# Patient Record
Sex: Female | Born: 1970 | Race: White | Hispanic: No | Marital: Married | Smoking: Former smoker
Health system: Southern US, Community
[De-identification: ages and names within clinical notes are randomized; demographics above are authoritative.]

## PROBLEM LIST (undated history)

## (undated) DIAGNOSIS — IMO0002 Reserved for concepts with insufficient information to code with codable children: Secondary | ICD-10-CM

## (undated) DIAGNOSIS — M1990 Unspecified osteoarthritis, unspecified site: Secondary | ICD-10-CM

## (undated) DIAGNOSIS — N200 Calculus of kidney: Secondary | ICD-10-CM

## (undated) DIAGNOSIS — N390 Urinary tract infection, site not specified: Secondary | ICD-10-CM

## (undated) DIAGNOSIS — M489 Spondylopathy, unspecified: Secondary | ICD-10-CM

## (undated) DIAGNOSIS — N201 Calculus of ureter: Principal | ICD-10-CM

## (undated) DIAGNOSIS — R112 Nausea with vomiting, unspecified: Secondary | ICD-10-CM

## (undated) DIAGNOSIS — B019 Varicella without complication: Secondary | ICD-10-CM

## (undated) DIAGNOSIS — Z9889 Other specified postprocedural states: Secondary | ICD-10-CM

## (undated) DIAGNOSIS — I10 Essential (primary) hypertension: Secondary | ICD-10-CM

## (undated) DIAGNOSIS — R310 Gross hematuria: Secondary | ICD-10-CM

## (undated) DIAGNOSIS — C4490 Unspecified malignant neoplasm of skin, unspecified: Secondary | ICD-10-CM

## (undated) DIAGNOSIS — R3129 Other microscopic hematuria: Secondary | ICD-10-CM

## (undated) DIAGNOSIS — N809 Endometriosis, unspecified: Secondary | ICD-10-CM

## (undated) DIAGNOSIS — K5792 Diverticulitis of intestine, part unspecified, without perforation or abscess without bleeding: Secondary | ICD-10-CM

## (undated) DIAGNOSIS — K219 Gastro-esophageal reflux disease without esophagitis: Secondary | ICD-10-CM

## (undated) DIAGNOSIS — L570 Actinic keratosis: Secondary | ICD-10-CM

## (undated) DIAGNOSIS — T8859XA Other complications of anesthesia, initial encounter: Secondary | ICD-10-CM

## (undated) DIAGNOSIS — T4145XA Adverse effect of unspecified anesthetic, initial encounter: Secondary | ICD-10-CM

## (undated) DIAGNOSIS — R339 Retention of urine, unspecified: Secondary | ICD-10-CM

## (undated) DIAGNOSIS — B372 Candidiasis of skin and nail: Secondary | ICD-10-CM

## (undated) DIAGNOSIS — T7840XA Allergy, unspecified, initial encounter: Secondary | ICD-10-CM

## (undated) DIAGNOSIS — R011 Cardiac murmur, unspecified: Secondary | ICD-10-CM

## (undated) HISTORY — DX: Spondylopathy, unspecified: M48.9

## (undated) HISTORY — DX: Endometriosis, unspecified: N80.9

## (undated) HISTORY — DX: Reserved for concepts with insufficient information to code with codable children: IMO0002

## (undated) HISTORY — DX: Unspecified malignant neoplasm of skin, unspecified: C44.90

## (undated) HISTORY — DX: Gastro-esophageal reflux disease without esophagitis: K21.9

## (undated) HISTORY — DX: Calculus of kidney: N20.0

## (undated) HISTORY — PX: ABDOMINAL HYSTERECTOMY: SHX81

## (undated) HISTORY — DX: Other specified postprocedural states: Z98.890

## (undated) HISTORY — PX: TONSILLECTOMY: SUR1361

## (undated) HISTORY — PX: CARPAL TUNNEL RELEASE: SHX101

## (undated) HISTORY — DX: Calculus of ureter: N20.1

## (undated) HISTORY — DX: Retention of urine, unspecified: R33.9

## (undated) HISTORY — DX: Diverticulitis of intestine, part unspecified, without perforation or abscess without bleeding: K57.92

## (undated) HISTORY — DX: Other microscopic hematuria: R31.29

## (undated) HISTORY — DX: Allergy, unspecified, initial encounter: T78.40XA

## (undated) HISTORY — DX: Actinic keratosis: L57.0

## (undated) HISTORY — DX: Gross hematuria: R31.0

## (undated) HISTORY — DX: Urinary tract infection, site not specified: N39.0

## (undated) HISTORY — PX: TOTAL ABDOMINAL HYSTERECTOMY W/ BILATERAL SALPINGOOPHORECTOMY: SHX83

## (undated) HISTORY — DX: Candidiasis of skin and nail: B37.2

## (undated) HISTORY — DX: Cardiac murmur, unspecified: R01.1

## (undated) HISTORY — DX: Morbid (severe) obesity due to excess calories: E66.01

## (undated) HISTORY — PX: CHOLECYSTECTOMY: SHX55

## (undated) HISTORY — DX: Varicella without complication: B01.9

## (undated) HISTORY — DX: Unspecified osteoarthritis, unspecified site: M19.90

---

## 2001-06-11 HISTORY — PX: FOOT SURGERY: SHX648

## 2003-06-12 HISTORY — PX: KNEE SURGERY: SHX244

## 2003-07-13 HISTORY — PX: BLADDER SURGERY: SHX569

## 2004-03-17 ENCOUNTER — Ambulatory Visit: Payer: Self-pay | Admitting: Podiatry

## 2004-08-28 ENCOUNTER — Ambulatory Visit: Payer: Self-pay

## 2005-07-30 ENCOUNTER — Ambulatory Visit: Payer: Self-pay | Admitting: Obstetrics and Gynecology

## 2005-11-12 ENCOUNTER — Ambulatory Visit: Payer: Self-pay | Admitting: Orthopedic Surgery

## 2005-11-28 ENCOUNTER — Ambulatory Visit: Payer: Self-pay | Admitting: Orthopedic Surgery

## 2005-12-06 ENCOUNTER — Encounter: Payer: Self-pay | Admitting: Orthopedic Surgery

## 2005-12-09 ENCOUNTER — Encounter: Payer: Self-pay | Admitting: Orthopedic Surgery

## 2006-10-14 ENCOUNTER — Ambulatory Visit: Payer: Self-pay | Admitting: Obstetrics and Gynecology

## 2008-12-29 DIAGNOSIS — Z85828 Personal history of other malignant neoplasm of skin: Secondary | ICD-10-CM

## 2008-12-29 HISTORY — DX: Personal history of other malignant neoplasm of skin: Z85.828

## 2009-04-11 LAB — HIV ANTIBODY (ROUTINE TESTING W REFLEX): HIV 1&2 Ab, 4th Generation: NEGATIVE

## 2011-01-31 ENCOUNTER — Ambulatory Visit: Payer: Self-pay | Admitting: Internal Medicine

## 2011-01-31 ENCOUNTER — Other Ambulatory Visit: Payer: Self-pay

## 2011-04-11 LAB — HM PAP SMEAR: HM Pap smear: NORMAL

## 2011-05-28 ENCOUNTER — Ambulatory Visit: Payer: Self-pay | Admitting: General Practice

## 2012-04-10 ENCOUNTER — Encounter: Payer: Self-pay | Admitting: Internal Medicine

## 2012-04-10 ENCOUNTER — Ambulatory Visit (INDEPENDENT_AMBULATORY_CARE_PROVIDER_SITE_OTHER): Payer: PRIVATE HEALTH INSURANCE | Admitting: Internal Medicine

## 2012-04-10 VITALS — BP 118/72 | HR 83 | Temp 98.2°F | Ht 64.0 in | Wt 288.8 lb

## 2012-04-10 DIAGNOSIS — M79641 Pain in right hand: Secondary | ICD-10-CM | POA: Insufficient documentation

## 2012-04-10 DIAGNOSIS — Z1239 Encounter for other screening for malignant neoplasm of breast: Secondary | ICD-10-CM

## 2012-04-10 DIAGNOSIS — R252 Cramp and spasm: Secondary | ICD-10-CM | POA: Insufficient documentation

## 2012-04-10 DIAGNOSIS — M79642 Pain in left hand: Secondary | ICD-10-CM

## 2012-04-10 DIAGNOSIS — M79609 Pain in unspecified limb: Secondary | ICD-10-CM

## 2012-04-10 DIAGNOSIS — R350 Frequency of micturition: Secondary | ICD-10-CM

## 2012-04-10 HISTORY — DX: Morbid (severe) obesity due to excess calories: E66.01

## 2012-04-10 LAB — POCT URINALYSIS DIPSTICK
Bilirubin, UA: NEGATIVE
Ketones, UA: NEGATIVE
Leukocytes, UA: NEGATIVE
Spec Grav, UA: >=1.03
pH, UA: 5.5

## 2012-04-10 NOTE — Assessment & Plan Note (Signed)
BMI 49. Will check TSH and blood sugar with labs. Will plan to develop diet and exercise program at next visit in 4 weeks.

## 2012-04-10 NOTE — Assessment & Plan Note (Signed)
Patient reports pain in bilateral hands right greater than left. She was diagnosed as having bulging disc in cervical spine and in possible carpal tunnel. Will obtain records on MRI of the cervical spine. Will likely ultimately need evaluation with EMG to confirm diagnosis. Followup 4 weeks.

## 2012-04-10 NOTE — Assessment & Plan Note (Signed)
Patient reports some intermittent muscle cramping particularly in her legs after increased exertion. Suspect related to hydration, obesity and perhaps electrolyte abnormality. Will check CMP, TSH, B12 with labs today. Followup one month.

## 2012-04-10 NOTE — Progress Notes (Signed)
Subjective:    Patient ID: Latoya Lutz, female    DOB: 1970-11-08, 41 y.o.   MRN: [id number]  HPI 41 year old female with history of endometriosis status post hysterectomy, right carpal tunnel syndrome, degenerative changes of the cervical spine presents to establish care. In regards to endometriosis, she reports prolonged period of misdiagnosis and traumatic, complicated pregnancy ultimately leading to diagnosis and evaluation at [facility]. She ultimately underwent complete hysterectomy and hormonal treatment with resolution of her symptoms. However, over the last 2 years she has had increasing episodes of urinary frequency, urinary incontinence, and urinary urgency. She questions if this is related to previous surgery. She denies any fever, chills, flank pain.  She is also concerned today about diffuse intermittent muscle cramping. She notes these are most prominent in her legs particularly after prolonged periods of standing or exertion. She reports that she consumes a large amount of water and feels that she is well-hydrated. She denies muscle weakness.  She is also concerned about diagnosis of right-sided carpal tunnel syndrome. She notes pain in both of her hands on occasion, right>left. She was also evaluated with MRI of cervical spine which she reports showed degenerative changes. She was in the process of being evaluated with EMG testing at [facility], but would like to establish with another orthopedic surgeon or consultant.  No outpatient encounter prescriptions on file as of 04/10/2012.   BP 118/72  Pulse 83  Temp 98.2 F (36.8 C) (Oral)  Ht 5\' 4"  (1.626 m)  Wt 288 lb 12 oz (130.976 kg)  BMI 49.56 kg/m2  SpO2 97%  Review of Systems  Constitutional: Negative for fever, chills, appetite change, fatigue and unexpected weight change.  HENT: Negative for ear pain, congestion, sore throat, trouble swallowing, neck pain, voice change and sinus pressure.   Eyes: Negative for visual  disturbance.  Respiratory: Negative for cough, shortness of breath, wheezing and stridor.   Cardiovascular: Negative for chest pain, palpitations and leg swelling.  Gastrointestinal: Negative for nausea, vomiting, abdominal pain, diarrhea, constipation, blood in stool, abdominal distention and anal bleeding.  Genitourinary: Positive for urgency and frequency. Negative for dysuria, hematuria and flank pain.  Musculoskeletal: Positive for myalgias. Negative for arthralgias and gait problem.  Skin: Negative for color change and rash.  Neurological: Negative for dizziness and headaches.  Hematological: Negative for adenopathy. Does not bruise/bleed easily.  Psychiatric/Behavioral: Negative for suicidal ideas, disturbed wake/sleep cycle and dysphoric mood. The patient is not nervous/anxious.        Objective:   Physical Exam  Constitutional: She is oriented to person, place, and time. She appears well-developed and well-nourished. No distress.  HENT:  Head: Normocephalic and atraumatic.  Right Ear: External ear normal.  Left Ear: External ear normal.  Nose: Nose normal.  Mouth/Throat: Oropharynx is clear and moist. No oropharyngeal exudate.  Eyes: Conjunctivae normal are normal. Pupils are equal, round, and reactive to light. Right eye exhibits no discharge. Left eye exhibits no discharge. No scleral icterus.  Neck: Normal range of motion. Neck supple. No tracheal deviation present. No thyromegaly present.  Cardiovascular: Normal rate, regular rhythm and intact distal pulses.  Exam reveals no gallop and no friction rub.   Murmur (1/6 SEM LSB) heard. Pulmonary/Chest: Effort normal and breath sounds normal. No respiratory distress. She has no wheezes. She has no rales. She exhibits no tenderness.  Musculoskeletal: Normal range of motion. She exhibits no edema and no tenderness.  Lymphadenopathy:    She has no cervical adenopathy.  Neurological: She is alert  and oriented to person, place, and  time. No cranial nerve deficit. She exhibits normal muscle tone. Coordination normal.  Skin: Skin is warm and dry. No rash noted. She is not diaphoretic. No erythema. No pallor.  Psychiatric: She has a normal mood and affect. Her behavior is normal. Judgment and thought content normal.          Assessment & Plan:

## 2012-04-10 NOTE — Assessment & Plan Note (Signed)
Patient has noted some urinary frequency and occasional urgency every since hysterectomy for endometriosis. Will obtain records on previous evaluation and management. Will send urinalysis and culture today. May need urogyn eval if symptoms persist.

## 2012-04-11 ENCOUNTER — Telehealth: Payer: Self-pay | Admitting: Internal Medicine

## 2012-04-11 NOTE — Telephone Encounter (Signed)
Patient advised as instructed via telephone.  She will call us back about referral.

## 2012-04-11 NOTE — Telephone Encounter (Signed)
I reviewed MRI of the cervical spine from December 2012. MRI showed mild degenerative changes but no significant disc bulging. If symptoms of pain or weakness in the right arm are persisting, we can set up evaluation with neurosurgery. I would recommend no [facility] neurosurgical.

## 2012-04-12 LAB — URINE CULTURE: Organism ID, Bacteria: NO GROWTH

## 2012-04-15 ENCOUNTER — Other Ambulatory Visit: Payer: Self-pay | Admitting: Internal Medicine

## 2012-04-15 LAB — COMPREHENSIVE METABOLIC PANEL
Anion Gap: 7 (ref 7–16)
BUN: 12 mg/dL (ref 7–18)
Calcium, Total: 9.2 mg/dL (ref 8.5–10.1)
Chloride: 107 mmol/L (ref 98–107)
Co2: 29 mmol/L (ref 21–32)
Creatinine: 0.79 mg/dL (ref 0.60–1.30)
EGFR (African American): >60
EGFR (Non-African Amer.): >60
Potassium: 4 mmol/L (ref 3.5–5.1)
SGOT(AST): 24 U/L (ref 15–37)
SGPT (ALT): 33 U/L (ref 12–78)
Sodium: 143 mmol/L (ref 136–145)
Total Protein: 7.8 g/dL (ref 6.4–8.2)

## 2012-04-15 LAB — CBC WITH DIFFERENTIAL/PLATELET
Basophil %: 0.4 %
Eosinophil %: 1.8 %
HCT: 37.4 % (ref 35.0–47.0)
HGB: 12.6 g/dL (ref 12.0–16.0)
Lymphocyte #: 3.3 10*3/uL (ref 1.0–3.6)
MCH: 28.8 pg (ref 26.0–34.0)
MCV: 86 fL (ref 80–100)
Monocyte #: 0.4 x10 3/mm (ref 0.2–0.9)
Neutrophil #: 4.4 10*3/uL (ref 1.4–6.5)
Neutrophil %: 52.5 %
RBC: 4.37 10*6/uL (ref 3.80–5.20)

## 2012-04-15 LAB — FOLATE: Folic Acid: 19.7 ng/mL (ref 3.1–100.0)

## 2012-04-15 LAB — MAGNESIUM: Magnesium: 1.9 mg/dL

## 2012-04-16 ENCOUNTER — Telehealth: Payer: Self-pay | Admitting: Internal Medicine

## 2012-04-16 NOTE — Telephone Encounter (Signed)
Recent labs reviewed from [facility] were normal.

## 2012-04-17 ENCOUNTER — Telehealth: Payer: Self-pay | Admitting: Internal Medicine

## 2012-04-17 ENCOUNTER — Encounter: Payer: Self-pay | Admitting: *Deleted

## 2012-04-17 ENCOUNTER — Ambulatory Visit: Payer: Self-pay | Admitting: Internal Medicine

## 2012-04-17 NOTE — Telephone Encounter (Signed)
Patient wanting results on urine culture.

## 2012-04-17 NOTE — Telephone Encounter (Signed)
Left message on machine for patient to return call

## 2012-04-17 NOTE — Telephone Encounter (Signed)
Letter mailed advising patient as instructed. 

## 2012-04-17 NOTE — Telephone Encounter (Signed)
Urine culture results were negative, no growth.

## 2012-04-18 NOTE — Telephone Encounter (Signed)
Left message on machine at home advising patient as instructed. 

## 2012-04-21 ENCOUNTER — Encounter: Payer: Self-pay | Admitting: Internal Medicine

## 2012-04-22 ENCOUNTER — Telehealth: Payer: Self-pay | Admitting: Internal Medicine

## 2012-04-22 NOTE — Telephone Encounter (Signed)
Pt is calling and needing her results from her Urine Culture. Please call her at [phone]

## 2012-04-22 NOTE — Telephone Encounter (Signed)
Left message on machine for patient to return call

## 2012-04-24 NOTE — Telephone Encounter (Signed)
Patient advised as instructed via telephone. 

## 2012-04-25 ENCOUNTER — Telehealth: Payer: Self-pay | Admitting: Internal Medicine

## 2012-04-25 ENCOUNTER — Encounter: Payer: Self-pay | Admitting: Internal Medicine

## 2012-04-25 NOTE — Telephone Encounter (Signed)
Patient advised as instructed via telephone.  She would like to be referred to a Urologist but needs to check to see which one her new insurance will cover.  She will call back and let me know.

## 2012-04-25 NOTE — Telephone Encounter (Signed)
Labs show that patient is immune to measles mumps and rubella as well as chickenpox.

## 2012-05-23 ENCOUNTER — Encounter: Payer: PRIVATE HEALTH INSURANCE | Admitting: Internal Medicine

## 2012-10-25 ENCOUNTER — Emergency Department: Payer: Self-pay | Admitting: Emergency Medicine

## 2012-10-25 LAB — URINALYSIS, COMPLETE
Bacteria: NONE SEEN
Hyaline Cast: 4
RBC,UR: 243 /HPF (ref 0–5)
Specific Gravity: 1.027 (ref 1.003–1.030)
WBC UR: 4 /HPF (ref 0–5)

## 2012-10-29 ENCOUNTER — Ambulatory Visit: Payer: Self-pay

## 2012-11-12 ENCOUNTER — Ambulatory Visit: Payer: Self-pay

## 2013-04-14 ENCOUNTER — Encounter: Payer: Self-pay | Admitting: Internal Medicine

## 2013-04-14 ENCOUNTER — Ambulatory Visit (INDEPENDENT_AMBULATORY_CARE_PROVIDER_SITE_OTHER): Payer: 59 | Admitting: Internal Medicine

## 2013-04-14 VITALS — BP 136/86 | HR 86 | Temp 98.3°F | Ht 64.0 in | Wt 285.0 lb

## 2013-04-14 DIAGNOSIS — Z0000 Encounter for general adult medical examination without abnormal findings: Secondary | ICD-10-CM | POA: Insufficient documentation

## 2013-04-14 MED ORDER — NYSTATIN-TRIAMCINOLONE 100000-0.1 UNIT/GM-% EX OINT: 1.0000 "application " | TOPICAL_OINTMENT | Freq: Two times a day (BID) | CUTANEOUS | Status: DC

## 2013-04-14 NOTE — Assessment & Plan Note (Signed)
Wt Readings from Last 3 Encounters:  04/14/13 285 lb (129.275 kg)  04/10/12 288 lb 12 oz (130.976 kg)   Body mass index is 48.9 kg/(m^2).  Encouraged healthy diet and regular physical activity. Will check thyroid function with labs.

## 2013-04-14 NOTE — Progress Notes (Signed)
Subjective:    Patient ID: Latoya Lutz, female    DOB: 11/21/1970, 42 y.o.   MRN: [id number]  HPI 42YO female presents for annual exam. Generally feeling well. Was recently diagnosed with narrowed urethra and has been undergoing urethral dilation q3 months. No problems with urination currently. Notes some tenderness in left genital area, questions if she may have scraped the area shaving. Otherwise, feeling well.   Outpatient Encounter Prescriptions as of 04/14/2013  Medication Sig  . Black Cohosh (REMIFEMIN PO) Take by mouth.  . Calcium Carb-Cholecalciferol (CALCIUM 600 + D PO) Take by mouth.  . L-ARGININE PO Take by mouth.  . loratadine (CLARITIN) 10 MG tablet Take 10 mg by mouth daily as needed for allergies.  . vitamin E 400 UNIT capsule Take 400 Units by mouth daily.   BP 136/86  Pulse 86  Temp(Src) 98.3 F (36.8 C) (Oral)  Ht 5\' 4"  (1.626 m)  Wt 285 lb (129.275 kg)  BMI 48.90 kg/m2  SpO2 97%  Review of Systems  Constitutional: Negative for fever, chills, appetite change, fatigue and unexpected weight change.  HENT: Negative for congestion, ear pain, sinus pressure, sore throat, trouble swallowing and voice change.   Eyes: Negative for visual disturbance.  Respiratory: Negative for cough, shortness of breath, wheezing and stridor.   Cardiovascular: Negative for chest pain, palpitations and leg swelling.  Gastrointestinal: Negative for nausea, vomiting, abdominal pain, diarrhea, constipation, blood in stool, abdominal distention and anal bleeding.  Genitourinary: Negative for dysuria and flank pain.  Musculoskeletal: Negative for arthralgias, gait problem, myalgias and neck pain.  Skin: Negative for color change and rash.  Neurological: Negative for dizziness and headaches.  Hematological: Negative for adenopathy. Does not bruise/bleed easily.  Psychiatric/Behavioral: Negative for suicidal ideas, sleep disturbance and dysphoric mood. The patient is not nervous/anxious.         Objective:   Physical Exam  Constitutional: She is oriented to person, place, and time. She appears well-developed and well-nourished. No distress.  HENT:  Head: Normocephalic and atraumatic.  Right Ear: External ear normal.  Left Ear: External ear normal.  Nose: Nose normal.  Mouth/Throat: Oropharynx is clear and moist. No oropharyngeal exudate.  Eyes: Conjunctivae are normal. Pupils are equal, round, and reactive to light. Right eye exhibits no discharge. Left eye exhibits no discharge. No scleral icterus.  Neck: Normal range of motion. Neck supple. No tracheal deviation present. No thyromegaly present.  Cardiovascular: Normal rate, regular rhythm, normal heart sounds and intact distal pulses.  Exam reveals no gallop and no friction rub.   No murmur heard. Pulmonary/Chest: Effort normal and breath sounds normal. No respiratory distress. She has no wheezes. She has no rales. She exhibits no tenderness.  Abdominal: Soft. Bowel sounds are normal. She exhibits no distension and no mass. There is no tenderness. There is no rebound and no guarding.  Genitourinary: Rectum normal and vagina normal.    No breast swelling, tenderness, discharge or bleeding. Pelvic exam was performed with patient supine. There is no rash, tenderness or lesion on the right labia. There is no rash, tenderness or lesion on the left labia. Cervix exhibits no motion tenderness, no discharge and no friability. Right adnexum displays no mass, no tenderness and no fullness. Left adnexum displays no mass, no tenderness and no fullness. No erythema or tenderness around the vagina. No vaginal discharge found.  Uterus surgically absent. Vaginal cuff present.  Musculoskeletal: Normal range of motion. She exhibits no edema and no tenderness.  Lymphadenopathy:  She has no cervical adenopathy.  Neurological: She is alert and oriented to person, place, and time. No cranial nerve deficit. She exhibits normal muscle tone.  Coordination normal.  Skin: Skin is warm and dry. No rash noted. She is not diaphoretic. No erythema. No pallor.  Psychiatric: She has a normal mood and affect. Her behavior is normal. Judgment and thought content normal.          Assessment & Plan:

## 2013-04-14 NOTE — Assessment & Plan Note (Signed)
General medical exam normal today except as noted. PAP pending. Given pt s/p hysterectomy, vaginal cuff noted on exam, no further PAP necessary. Mammogram ordered. Will check labs including CBC,CMP, lipids, TSH, Vit D. Encouraged healthy diet and regular exercise.

## 2013-04-14 NOTE — Progress Notes (Signed)
Pre-visit discussion using our clinic review tool. No additional management support is needed unless otherwise documented below in the visit note.  

## 2013-04-15 ENCOUNTER — Other Ambulatory Visit (HOSPITAL_COMMUNITY)
Admission: RE | Admit: 2013-04-15 | Discharge: 2013-04-15 | Disposition: A | Discharge status: Home / Self Care | Payer: 59 | Source: Ambulatory Visit | Attending: Internal Medicine | Admitting: Internal Medicine

## 2013-04-15 DIAGNOSIS — Z01419 Encounter for gynecological examination (general) (routine) without abnormal findings: Secondary | ICD-10-CM | POA: Insufficient documentation

## 2013-04-15 DIAGNOSIS — Z1151 Encounter for screening for human papillomavirus (HPV): Secondary | ICD-10-CM | POA: Insufficient documentation

## 2013-04-15 LAB — LIPID PANEL
HDL: 40.6 mg/dL (ref >39.00)
Total CHOL/HDL Ratio: 5

## 2013-04-15 LAB — COMPREHENSIVE METABOLIC PANEL
ALT: 20 U/L (ref 0–35)
AST: 19 U/L (ref 0–37)
Albumin: 3.9 g/dL (ref 3.5–5.2)
CO2: 27 mEq/L (ref 19–32)
Calcium: 9.1 mg/dL (ref 8.4–10.5)
Chloride: 105 mEq/L (ref 96–112)
Creatinine, Ser: 0.9 mg/dL (ref 0.4–1.2)
GFR: 74.34 mL/min (ref >60.00)
Potassium: 3.9 mEq/L (ref 3.5–5.1)
Total Bilirubin: 0.2 mg/dL — ABNORMAL LOW (ref 0.3–1.2)

## 2013-04-15 LAB — CBC WITH DIFFERENTIAL/PLATELET
Basophils Absolute: 0 10*3/uL (ref 0.0–0.1)
Basophils Relative: 0.4 % (ref 0.0–3.0)
Eosinophils Relative: 1.3 % (ref 0.0–5.0)
HCT: 37.2 % (ref 36.0–46.0)
Lymphs Abs: 3.2 10*3/uL (ref 0.7–4.0)
MCV: 85.8 fl (ref 78.0–100.0)
Monocytes Absolute: 0.4 10*3/uL (ref 0.1–1.0)
Monocytes Relative: 4.1 % (ref 3.0–12.0)
RBC: 4.33 Mil/uL (ref 3.87–5.11)
WBC: 8.5 10*3/uL (ref 4.5–10.5)

## 2013-04-15 LAB — TSH: TSH: 2.15 u[IU]/mL (ref 0.35–5.50)

## 2013-04-15 LAB — LDL CHOLESTEROL, DIRECT: Direct LDL: 136.3 mg/dL

## 2013-04-15 LAB — HM PAP SMEAR: HM PAP: NEGATIVE

## 2013-04-15 NOTE — Addendum Note (Signed)
Addended by: Montine Circle D on: 04/15/2013 02:04 PM   Modules accepted: Orders

## 2013-04-16 ENCOUNTER — Encounter: Payer: Self-pay | Admitting: *Deleted

## 2013-04-29 ENCOUNTER — Ambulatory Visit: Payer: Self-pay | Admitting: Internal Medicine

## 2013-05-20 ENCOUNTER — Encounter: Payer: Self-pay | Admitting: Internal Medicine

## 2014-05-25 ENCOUNTER — Encounter: Payer: Self-pay | Admitting: Internal Medicine

## 2014-05-25 ENCOUNTER — Encounter: Payer: 59 | Admitting: Internal Medicine

## 2014-05-25 ENCOUNTER — Ambulatory Visit (INDEPENDENT_AMBULATORY_CARE_PROVIDER_SITE_OTHER): Payer: 59 | Admitting: Internal Medicine

## 2014-05-25 ENCOUNTER — Ambulatory Visit: Payer: Self-pay | Admitting: Internal Medicine

## 2014-05-25 VITALS — BP 137/81 | HR 81 | Temp 98.9°F | Ht 65.25 in | Wt 287.0 lb

## 2014-05-25 DIAGNOSIS — B372 Candidiasis of skin and nail: Secondary | ICD-10-CM

## 2014-05-25 DIAGNOSIS — K219 Gastro-esophageal reflux disease without esophagitis: Secondary | ICD-10-CM

## 2014-05-25 DIAGNOSIS — Z0000 Encounter for general adult medical examination without abnormal findings: Secondary | ICD-10-CM

## 2014-05-25 HISTORY — DX: Candidiasis of skin and nail: B37.2

## 2014-05-25 HISTORY — DX: Gastro-esophageal reflux disease without esophagitis: K21.9

## 2014-05-25 LAB — CBC WITH DIFFERENTIAL/PLATELET
Basophils Absolute: 0 10*3/uL (ref 0.0–0.1)
Basophils Relative: 0.4 % (ref 0.0–3.0)
EOS ABS: 0.1 10*3/uL (ref 0.0–0.7)
Eosinophils Relative: 1.2 % (ref 0.0–5.0)
HCT: 37.1 % (ref 36.0–46.0)
Hemoglobin: 12.1 g/dL (ref 12.0–15.0)
Lymphocytes Relative: 38.1 % (ref 12.0–46.0)
Lymphs Abs: 2.9 10*3/uL (ref 0.7–4.0)
MCHC: 32.7 g/dL (ref 30.0–36.0)
MCV: 85.9 fl (ref 78.0–100.0)
MONO ABS: 0.4 10*3/uL (ref 0.1–1.0)
Monocytes Relative: 5.1 % (ref 3.0–12.0)
NEUTROS PCT: 55.2 % (ref 43.0–77.0)
Neutro Abs: 4.3 10*3/uL (ref 1.4–7.7)
PLATELETS: 273 10*3/uL (ref 150.0–400.0)
RBC: 4.32 Mil/uL (ref 3.87–5.11)
RDW: 14.2 % (ref 11.5–15.5)
WBC: 7.7 10*3/uL (ref 4.0–10.5)

## 2014-05-25 LAB — LIPID PANEL
CHOL/HDL RATIO: 6
Cholesterol: 205 mg/dL — ABNORMAL HIGH (ref 0–200)
HDL: 37 mg/dL — ABNORMAL LOW (ref >39.00)
NONHDL: 168
Triglycerides: 206 mg/dL — ABNORMAL HIGH (ref 0.0–149.0)
VLDL: 41.2 mg/dL — AB (ref 0.0–40.0)

## 2014-05-25 LAB — COMPREHENSIVE METABOLIC PANEL
ALBUMIN: 3.8 g/dL (ref 3.5–5.2)
ALK PHOS: 81 U/L (ref 39–117)
ALT: 18 U/L (ref 0–35)
AST: 19 U/L (ref 0–37)
BILIRUBIN TOTAL: 0.4 mg/dL (ref 0.2–1.2)
BUN: 11 mg/dL (ref 6–23)
CO2: 26 mEq/L (ref 19–32)
Calcium: 9.3 mg/dL (ref 8.4–10.5)
Chloride: 105 mEq/L (ref 96–112)
Creatinine, Ser: 0.7 mg/dL (ref 0.4–1.2)
GFR: 94.76 mL/min (ref >60.00)
Glucose, Bld: 92 mg/dL (ref 70–99)
POTASSIUM: 4 meq/L (ref 3.5–5.1)
SODIUM: 138 meq/L (ref 135–145)
TOTAL PROTEIN: 6.7 g/dL (ref 6.0–8.3)

## 2014-05-25 LAB — TSH: TSH: 2.61 u[IU]/mL (ref 0.35–4.50)

## 2014-05-25 LAB — VITAMIN D 25 HYDROXY (VIT D DEFICIENCY, FRACTURES): VITD: 21.53 ng/mL — ABNORMAL LOW (ref 30.00–100.00)

## 2014-05-25 LAB — HEMOGLOBIN A1C: Hgb A1c MFr Bld: 5.9 % (ref 4.6–6.5)

## 2014-05-25 LAB — LDL CHOLESTEROL, DIRECT: LDL DIRECT: 135.7 mg/dL

## 2014-05-25 LAB — HM MAMMOGRAPHY

## 2014-05-25 LAB — MICROALBUMIN / CREATININE URINE RATIO
Creatinine,U: 143.4 mg/dL
MICROALB/CREAT RATIO: 0.8 mg/g (ref 0.0–30.0)
Microalb, Ur: 1.2 mg/dL (ref 0.0–1.9)

## 2014-05-25 MED ORDER — PANTOPRAZOLE SODIUM 40 MG PO TBEC: 40.0000 mg | DELAYED_RELEASE_TABLET | Freq: Every day | ORAL | Status: DC

## 2014-05-25 MED ORDER — NYSTATIN 100000 UNIT/GM EX POWD: CUTANEOUS | Status: DC

## 2014-05-25 MED ORDER — FLUCONAZOLE 150 MG PO TABS: 150.0000 mg | ORAL_TABLET | ORAL | Status: DC

## 2014-05-25 NOTE — Assessment & Plan Note (Signed)
Symptoms consistent with GERD. Will start Pantoprazole. Encouraged weight loss and avoidance of spicy foods. Follow up 4 weeks. If no improvement, discussed referral for endoscopy.

## 2014-05-25 NOTE — Assessment & Plan Note (Signed)
Rash consistent with candidal dermatitis left groin. Will treat with Diflucan weekly x2, and topical Nystatin powder. Follow up if symptoms are not improving.

## 2014-05-25 NOTE — Patient Instructions (Addendum)
Start Pantoprazole 82m daily to help with reflux symptoms.  Follow up recheck in 4 weeks.  Health Maintenance Adopting a healthy lifestyle and getting preventive care can go a long way to promote health and wellness. Talk with your health care provider about what schedule of regular examinations is right for you. This is a good chance for you to check in with your provider about disease prevention and staying healthy. In between checkups, there are plenty of things you can do on your own. Experts have done a lot of research about which lifestyle changes and preventive measures are most likely to keep you healthy. Ask your health care provider for more information. WEIGHT AND DIET  Eat a healthy diet  Be sure to include plenty of vegetables, fruits, low-fat dairy products, and lean protein.  Do not eat a lot of foods high in solid fats, added sugars, or salt.  Get regular exercise. This is one of the most important things you can do for your health.  Most adults should exercise for at least 150 minutes each week. The exercise should increase your heart rate and make you sweat (moderate-intensity exercise).  Most adults should also do strengthening exercises at least twice a week. This is in addition to the moderate-intensity exercise.  Maintain a healthy weight  Body mass index (BMI) is a measurement that can be used to identify possible weight problems. It estimates body fat based on height and weight. Your health care provider can help determine your BMI and help you achieve or maintain a healthy weight.  For females 242years of age and older:   A BMI below 18.5 is considered underweight.  A BMI of 18.5 to 24.9 is normal.  A BMI of 25 to 29.9 is considered overweight.  A BMI of 30 and above is considered obese.  Watch levels of cholesterol and blood lipids  You should start having your blood tested for lipids and cholesterol at 44years of age, then have this test every 5  years.  You may need to have your cholesterol levels checked more often if:  Your lipid or cholesterol levels are high.  You are older than 44years of age.  You are at high risk for heart disease.  CANCER SCREENING   Lung Cancer  Lung cancer screening is recommended for adults [id number] old who are at high risk for lung cancer because of a history of smoking.  A yearly low-dose CT scan of the lungs is recommended for people who:  Currently smoke.  Have quit within the past 15 years.  Have at least a 30-pack-year history of smoking. A pack year is smoking an average of one pack of cigarettes a day for 1 year.  Yearly screening should continue until it has been 15 years since you quit.  Yearly screening should stop if you develop a health problem that would prevent you from having lung cancer treatment.  Breast Cancer  Practice breast self-awareness. This means understanding how your breasts normally appear and feel.  It also means doing regular breast self-exams. Let your health care provider know about any changes, no matter how small.  If you are in your 20s or 30s, you should have a clinical breast exam (CBE) by a health care provider every 1-3 years as part of a regular health exam.  If you are 474or older, have a CBE every year. Also consider having a breast X-ray (mammogram) every year.  If you have a family  history of breast cancer, talk to your health care provider about genetic screening.  If you are at high risk for breast cancer, talk to your health care provider about having an MRI and a mammogram every year.  Breast cancer gene (BRCA) assessment is recommended for women who have family members with BRCA-related cancers. BRCA-related cancers include:  Breast.  Ovarian.  Tubal.  Peritoneal cancers.  Results of the assessment will determine the need for genetic counseling and BRCA1 and BRCA2 testing. Cervical Cancer Routine pelvic examinations to  screen for cervical cancer are no longer recommended for nonpregnant women who are considered low risk for cancer of the pelvic organs (ovaries, uterus, and vagina) and who do not have symptoms. A pelvic examination may be necessary if you have symptoms including those associated with pelvic infections. Ask your health care provider if a screening pelvic exam is right for you.   The Pap test is the screening test for cervical cancer for women who are considered at risk.  If you had a hysterectomy for a problem that was not cancer or a condition that could lead to cancer, then you no longer need Pap tests.  If you are older than 65 years, and you have had normal Pap tests for the past 10 years, you no longer need to have Pap tests.  If you have had past treatment for cervical cancer or a condition that could lead to cancer, you need Pap tests and screening for cancer for at least 20 years after your treatment.  If you no longer get a Pap test, assess your risk factors if they change (such as having a new sexual partner). This can affect whether you should start being screened again.  Some women have medical problems that increase their chance of getting cervical cancer. If this is the case for you, your health care provider may recommend more frequent screening and Pap tests.  The human papillomavirus (HPV) test is another test that may be used for cervical cancer screening. The HPV test looks for the virus that can cause cell changes in the cervix. The cells collected during the Pap test can be tested for HPV.  The HPV test can be used to screen women 57 years of age and older. Getting tested for HPV can extend the interval between normal Pap tests from three to five years.  An HPV test also should be used to screen women of any age who have unclear Pap test results.  After 44 years of age, women should have HPV testing as often as Pap tests.  Colorectal Cancer  This type of cancer can be  detected and often prevented.  Routine colorectal cancer screening usually begins at 44 years of age and continues through 44 years of age.  Your health care provider may recommend screening at an earlier age if you have risk factors for colon cancer.  Your health care provider may also recommend using home test kits to check for hidden blood in the stool.  A small camera at the end of a tube can be used to examine your colon directly (sigmoidoscopy or colonoscopy). This is done to check for the earliest forms of colorectal cancer.  Routine screening usually begins at age 66.  Direct examination of the colon should be repeated every 5-10 years through 44 years of age. However, you may need to be screened more often if early forms of precancerous polyps or small growths are found. Skin Cancer  Check your skin from  head to toe regularly.  Tell your health care provider about any new moles or changes in moles, especially if there is a change in a mole's shape or color.  Also tell your health care provider if you have a mole that is larger than the size of a pencil eraser.  Always use sunscreen. Apply sunscreen liberally and repeatedly throughout the day.  Protect yourself by wearing long sleeves, pants, a wide-brimmed hat, and sunglasses whenever you are outside. HEART DISEASE, DIABETES, AND HIGH BLOOD PRESSURE   Have your blood pressure checked at least every 1-2 years. High blood pressure causes heart disease and increases the risk of stroke.  If you are between 35 years and 50 years old, ask your health care provider if you should take aspirin to prevent strokes.  Have regular diabetes screenings. This involves taking a blood sample to check your fasting blood sugar level.  If you are at a normal weight and have a low risk for diabetes, have this test once every three years after 44 years of age.  If you are overweight and have a high risk for diabetes, consider being tested at a  younger age or more often. PREVENTING INFECTION  Hepatitis B  If you have a higher risk for hepatitis B, you should be screened for this virus. You are considered at high risk for hepatitis B if:  You were born in a country where hepatitis B is common. Ask your health care provider which countries are considered high risk.  Your parents were born in a high-risk country, and you have not been immunized against hepatitis B (hepatitis B vaccine).  You have HIV or AIDS.  You use needles to inject street drugs.  You live with someone who has hepatitis B.  You have had sex with someone who has hepatitis B.  You get hemodialysis treatment.  You take certain medicines for conditions, including cancer, organ transplantation, and autoimmune conditions. Hepatitis C  Blood testing is recommended for:  Everyone born from [birth year] through 1966.  Anyone with known risk factors for hepatitis C. Sexually transmitted infections (STIs)  You should be screened for sexually transmitted infections (STIs) including gonorrhea and chlamydia if:  You are sexually active and are younger than 44 years of age.  You are older than 44 years of age and your health care provider tells you that you are at risk for this type of infection.  Your sexual activity has changed since you were last screened and you are at an increased risk for chlamydia or gonorrhea. Ask your health care provider if you are at risk.  If you do not have HIV, but are at risk, it may be recommended that you take a prescription medicine daily to prevent HIV infection. This is called pre-exposure prophylaxis (PrEP). You are considered at risk if:  You are sexually active and do not regularly use condoms or know the HIV status of your partner(s).  You take drugs by injection.  You are sexually active with a partner who has HIV. Talk with your health care provider about whether you are at high risk of being infected with HIV. If you choose  to begin PrEP, you should first be tested for HIV. You should then be tested every 3 months for as long as you are taking PrEP.  PREGNANCY   If you are premenopausal and you may become pregnant, ask your health care provider about preconception counseling.  If you may become pregnant, take 400 to 800  micrograms (mcg) of folic acid every day.  If you want to prevent pregnancy, talk to your health care provider about birth control (contraception). OSTEOPOROSIS AND MENOPAUSE   Osteoporosis is a disease in which the bones lose minerals and strength with aging. This can result in serious bone fractures. Your risk for osteoporosis can be identified using a bone density scan.  If you are 26 years of age or older, or if you are at risk for osteoporosis and fractures, ask your health care provider if you should be screened.  Ask your health care provider whether you should take a calcium or vitamin D supplement to lower your risk for osteoporosis.  Menopause may have certain physical symptoms and risks.  Hormone replacement therapy may reduce some of these symptoms and risks. Talk to your health care provider about whether hormone replacement therapy is right for you.  HOME CARE INSTRUCTIONS   Schedule regular health, dental, and eye exams.  Stay current with your immunizations.   Do not use any tobacco products including cigarettes, chewing tobacco, or electronic cigarettes.  If you are pregnant, do not drink alcohol.  If you are breastfeeding, limit how much and how often you drink alcohol.  Limit alcohol intake to no more than 1 drink per day for nonpregnant women. One drink equals 12 ounces of beer, 5 ounces of wine, or 1 ounces of hard liquor.  Do not use street drugs.  Do not share needles.  Ask your health care provider for help if you need support or information about quitting drugs.  Tell your health care provider if you often feel depressed.  Tell your health care  provider if you have ever been abused or do not feel safe at home. Document Released: 12/11/2010 Document Revised: 10/12/2013 Document Reviewed: 04/29/2013 [facility] Patient Information 2015 [place], [place]. This information is not intended to replace advice given to you by your health care provider. Make sure you discuss any questions you have with your health care provider.

## 2014-05-25 NOTE — Progress Notes (Signed)
Pre visit review using our clinic review tool, if applicable. No additional management support is needed unless otherwise documented below in the visit note. 

## 2014-05-25 NOTE — Assessment & Plan Note (Signed)
General medical exam including breast exam normal today. PAP and pelvic exam normal 2014, HPV neg, plan repeat 2017. Mammogram pending today. Encouraged healthy diet and exercise. Labs today including CBC, CMP, lipids, A1c, Vit D, TSH. Immunizations are UTD.

## 2014-05-25 NOTE — Progress Notes (Signed)
Subjective:    Patient ID: Latoya Lutz, female    DOB: 03/28/1971, 43 y.o.   MRN: [id number]  HPI 43YO female presents for annual exam.  Concerned about nausea in the evenings. Symptoms improve with eating some food. Sometimes burning in upper abdominal area. Limiting intake of spicy foods with some improvement.  Aside from this, feeling well. Stays active, but no exercise regimen. Trying to follow healthier diet.  Wt Readings from Last 3 Encounters:  05/25/14 287 lb (130.182 kg)  04/14/13 285 lb (129.275 kg)  04/10/12 288 lb 12 oz (130.976 kg)     Past medical, surgical, family and social history per today's encounter.  Review of Systems  Constitutional: Negative for fever, chills, appetite change, fatigue and unexpected weight change.  Eyes: Negative for visual disturbance.  Respiratory: Negative for shortness of breath.   Cardiovascular: Negative for chest pain and leg swelling.  Gastrointestinal: Positive for abdominal pain (epigastric). Negative for nausea, vomiting, diarrhea, constipation and blood in stool.  Musculoskeletal: Negative for myalgias and arthralgias.  Skin: Positive for color change and rash.  Hematological: Negative for adenopathy. Does not bruise/bleed easily.  Psychiatric/Behavioral: Negative for sleep disturbance and dysphoric mood. The patient is not nervous/anxious.        Objective:    BP 137/81 mmHg  Pulse 81  Temp(Src) 98.9 F (37.2 C) (Oral)  Ht 5' 5.25" (1.657 m)  Wt 287 lb (130.182 kg)  BMI 47.41 kg/m2  SpO2 97% Physical Exam  Constitutional: She is oriented to person, place, and time. She appears well-developed and well-nourished. No distress.  HENT:  Head: Normocephalic and atraumatic.  Right Ear: External ear normal.  Left Ear: External ear normal.  Nose: Nose normal.  Mouth/Throat: Oropharynx is clear and moist. No oropharyngeal exudate.  Eyes: Conjunctivae are normal. Pupils are equal, round, and reactive to light. Right  eye exhibits no discharge. Left eye exhibits no discharge. No scleral icterus.  Neck: Normal range of motion. Neck supple. No tracheal deviation present. No thyromegaly present.  Cardiovascular: Normal rate, regular rhythm, normal heart sounds and intact distal pulses.  Exam reveals no gallop and no friction rub.   No murmur heard. Pulmonary/Chest: Effort normal and breath sounds normal. No accessory muscle usage. No tachypnea. No respiratory distress. She has no decreased breath sounds. She has no wheezes. She has no rales. She exhibits no tenderness. Right breast exhibits no inverted nipple, no mass, no nipple discharge, no skin change and no tenderness. Left breast exhibits no inverted nipple, no mass, no nipple discharge, no skin change and no tenderness. Breasts are symmetrical.  Abdominal: Soft. Bowel sounds are normal. She exhibits no distension and no mass. There is no tenderness. There is no rebound and no guarding.  Musculoskeletal: Normal range of motion. She exhibits no edema or tenderness.  Lymphadenopathy:    She has no cervical adenopathy.  Neurological: She is alert and oriented to person, place, and time. No cranial nerve deficit. She exhibits normal muscle tone. Coordination normal.  Skin: Skin is warm and dry. Rash noted. Rash is maculopapular. She is not diaphoretic. No erythema. No pallor.     Psychiatric: She has a normal mood and affect. Her behavior is normal. Judgment and thought content normal.          Assessment & Plan:   Problem List Items Addressed This Visit      Unprioritized   Candidal dermatitis    Rash consistent with candidal dermatitis left groin. Will treat with Diflucan  weekly x2, and topical Nystatin powder. Follow up if symptoms are not improving.    Relevant Medications      Nystatin (MYCOSTATIN) 100,000 units/Gm top powder      fluconazole (DIFLUCAN) tablet 150 mg   Esophageal reflux    Symptoms consistent with GERD. Will start Pantoprazole.  Encouraged weight loss and avoidance of spicy foods. Follow up 4 weeks. If no improvement, discussed referral for endoscopy.    Relevant Medications      pantoprazole (PROTONIX) EC tablet   Other Relevant Orders      CBC with Differential      Comprehensive metabolic panel      Lipid panel      Microalbumin / creatinine urine ratio      Vit D  25 hydroxy (rtn osteoporosis monitoring)      Hemoglobin A1c      TSH   Obesity, morbid, BMI 40.0-49.9    Wt Readings from Last 3 Encounters:  05/25/14 287 lb (130.182 kg)  04/14/13 285 lb (129.275 kg)  04/10/12 288 lb 12 oz (130.976 kg)   Body mass index is 47.41 kg/(m^2). Encouraged healthy diet and exercise with goal of weight loss.    Routine general medical examination at a health care facility - Primary    General medical exam including breast exam normal today. PAP and pelvic exam normal 2014, HPV neg, plan repeat 2017. Mammogram pending today. Encouraged healthy diet and exercise. Labs today including CBC, CMP, lipids, A1c, Vit D, TSH. Immunizations are UTD.    Relevant Medications      pantoprazole (PROTONIX) EC tablet       Return in about 4 weeks (around 06/22/2014) for Recheck.

## 2014-05-25 NOTE — Assessment & Plan Note (Signed)
Wt Readings from Last 3 Encounters:  05/25/14 287 lb (130.182 kg)  04/14/13 285 lb (129.275 kg)  04/10/12 288 lb 12 oz (130.976 kg)   Body mass index is 47.41 kg/(m^2). Encouraged healthy diet and exercise with goal of weight loss.

## 2014-06-28 ENCOUNTER — Telehealth: Payer: Self-pay | Admitting: *Deleted

## 2014-06-28 NOTE — Telephone Encounter (Signed)
Pt called asking if you could send in an RX for weight loss medication Belveqi

## 2014-06-29 ENCOUNTER — Encounter: Payer: Self-pay | Admitting: Internal Medicine

## 2014-06-29 NOTE — Telephone Encounter (Signed)
Notified pt. 

## 2014-06-29 NOTE — Telephone Encounter (Signed)
Needs visit

## 2014-09-11 DIAGNOSIS — R3129 Other microscopic hematuria: Secondary | ICD-10-CM

## 2014-09-11 DIAGNOSIS — N390 Urinary tract infection, site not specified: Secondary | ICD-10-CM

## 2014-09-11 DIAGNOSIS — B49 Unspecified mycosis: Secondary | ICD-10-CM | POA: Insufficient documentation

## 2014-09-11 DIAGNOSIS — N200 Calculus of kidney: Secondary | ICD-10-CM

## 2014-09-11 DIAGNOSIS — N35919 Unspecified urethral stricture, male, unspecified site: Secondary | ICD-10-CM | POA: Insufficient documentation

## 2014-09-11 DIAGNOSIS — R339 Retention of urine, unspecified: Secondary | ICD-10-CM | POA: Insufficient documentation

## 2014-09-11 DIAGNOSIS — R350 Frequency of micturition: Secondary | ICD-10-CM | POA: Insufficient documentation

## 2014-09-11 HISTORY — DX: Other microscopic hematuria: R31.29

## 2014-09-11 HISTORY — DX: Calculus of kidney: N20.0

## 2014-09-11 HISTORY — DX: Urinary tract infection, site not specified: N39.0

## 2014-10-25 ENCOUNTER — Encounter: Payer: Self-pay | Admitting: Urology

## 2014-11-10 ENCOUNTER — Ambulatory Visit: Payer: Self-pay | Admitting: Urology

## 2014-11-21 ENCOUNTER — Other Ambulatory Visit: Payer: Self-pay | Admitting: Urology

## 2014-11-21 DIAGNOSIS — N35028 Other post-traumatic urethral stricture, female: Secondary | ICD-10-CM

## 2014-11-22 ENCOUNTER — Encounter: Payer: Self-pay | Admitting: Urology

## 2014-11-22 ENCOUNTER — Ambulatory Visit (INDEPENDENT_AMBULATORY_CARE_PROVIDER_SITE_OTHER): Payer: 59 | Admitting: Urology

## 2014-11-22 VITALS — BP 149/85 | HR 73 | Ht 66.0 in | Wt 284.5 lb

## 2014-11-22 DIAGNOSIS — N3941 Urge incontinence: Secondary | ICD-10-CM | POA: Diagnosis not present

## 2014-11-22 DIAGNOSIS — N35028 Other post-traumatic urethral stricture, female: Secondary | ICD-10-CM | POA: Diagnosis not present

## 2014-11-22 LAB — URINALYSIS, COMPLETE
BILIRUBIN UA: NEGATIVE
Glucose, UA: NEGATIVE
Ketones, UA: NEGATIVE
Nitrite, UA: NEGATIVE
Protein, UA: NEGATIVE
Specific Gravity, UA: 1.025 (ref 1.005–1.030)
UUROB: 0.2 mg/dL (ref 0.2–1.0)
pH, UA: 5 (ref 5.0–7.5)

## 2014-11-22 LAB — MICROSCOPIC EXAMINATION: Bacteria, UA: NONE SEEN

## 2014-11-22 MED ORDER — NITROFURANTOIN MONOHYD MACRO 100 MG PO CAPS: 100.0000 mg | ORAL_CAPSULE | Freq: Two times a day (BID) | ORAL | Status: DC

## 2014-11-22 NOTE — Progress Notes (Signed)
11/22/2014 2:01 PM   Latoya Lutz Oct 21, 1970 [id number]  Referring provider: Jackolyn Confer, MD [address] [place], [place] [address]  Chief Complaint  Patient presents with  . Urethral Stricture    Patient states needs to be stretched every few months.    HPI: [name] is a 44 y/o white female with a h/o urethral stricture who presents today for a urethral dilation.  She was referred to [place] in 2013 for increased frequency, increased urgency and feelings of not emptying her bladder by Dr. Ronette Deter, M.D.  Per patient, she was diagnosed by Dr. Madelin Headings with urethral dilation and had been receiving serial dilations every 4 to 6 months since 2013.  After she has a dilation, her symptoms of frequency, increased urgency and feelings of not emptying her bladder abate for 3 to 6 months.      PMH: Past Medical History  Diagnosis Date  . Heart murmur   . Chicken pox   . Kidney stones   . Endometriosis     Dr. Rushie Chestnut at [facility], removed   . Cervical spine disease   . Chronic urethral narrowing     undergoing stretching  . H/O cystoscopy     normal  . Urinary retention   . Acid reflux   . Arthritis   . Skin cancer     Surgical History: Past Surgical History  Procedure Laterality Date  . Cholecystectomy    . Bladder surgery  07/2003  . Cesarean section  1998  . Abdominal hysterectomy      [facility] complete  . Tonsillectomy and adenoidectomy    . Knee surgery  2005  . Foot surgery  2003    Home Medications:    Medication List       This list is accurate as of: 11/22/14  2:01 PM.  Always use your most recent med list.               CALCIUM 600 + D PO  Take by mouth.     CALCIUM 600 PO  Take by mouth.     fluconazole 150 MG tablet  Commonly known as:  DIFLUCAN  Take 1 tablet (150 mg total) by mouth once a week.     fluticasone 50 MCG/ACT nasal spray  Commonly known as:  FLONASE  Place into the nose.     L-ARGININE PO  Take by  mouth.     loratadine 10 MG tablet  Commonly known as:  CLARITIN  Take 10 mg by mouth daily as needed for allergies.     MENOPAUSE FORMULA Tabs  Take by mouth.     nystatin 100000 UNIT/GM Powd  Apply to affected area twice daily     nystatin-triamcinolone ointment  Commonly known as:  MYCOLOG  Apply 1 application topically 2 (two) times daily.     pantoprazole 40 MG tablet  Commonly known as:  PROTONIX  Take 1 tablet (40 mg total) by mouth daily.     pseudoephedrine 30 MG tablet  Commonly known as:  SUDAFED  Take by mouth.     REMIFEMIN PO  Take by mouth.     Vitamin D (Cholecalciferol) 1000 UNITS Tabs  Take by mouth.     vitamin E 400 UNIT capsule  Take 400 Units by mouth daily.        Allergies: No Known Allergies  Family History: Family History  Problem Relation Age of Onset  . Cancer Maternal Grandmother   .  Diabetes Paternal Grandmother     Social History:  reports that she has quit smoking. She does not have any smokeless tobacco history on file. She reports that she does not drink alcohol or use illicit drugs.  ROS: Urological Symptom Review  Patient is experiencing the following symptoms: Frequent urination Hard to postpone urination   Review of Systems  Gastrointestinal (upper)  : Negative for upper GI symptoms  Gastrointestinal (lower) : Negative for lower GI symptoms  Constitutional : Negative for symptoms  Skin: Negative for skin symptoms  Eyes: Negative for eye symptoms  Ear/Nose/Throat : Negative for Ear/Nose/Throat symptoms  Hematologic/Lymphatic: Negative for Hematologic/Lymphatic symptoms  Cardiovascular : Negative for cardiovascular symptoms  Respiratory : Negative for respiratory symptoms  Endocrine: Negative for endocrine symptoms  Musculoskeletal: Negative for musculoskeletal symptoms  Neurological: Negative for neurological symptoms  Psychologic: Negative for psychiatric symptoms   Physical  Exam: BP 149/85 mmHg  Pulse 73  Ht 5\' 6"  (1.676 m)  Wt 284 lb 8 oz (129.048 kg)  BMI 45.94 kg/m2  GU:  Normal external genitalia.  Normal urethral meatus. No urethral masses and/or tenderness. No bladder fullness or masses. No vaginal lesions or discharge. Normal rectal tone, no masses. Normal anus and perineum.   Laboratory Data: Results for orders placed or performed in visit on 11/22/14  Microscopic Examination  Result Value Ref Range   WBC, UA 0-5 0 -  5 /hpf   RBC, UA 0-2 0 -  2 /hpf   Epithelial Cells (non renal) 0-10 0 - 10 /hpf   Crystals Present (A) N/A   Crystal Type Calcium Oxalate N/A   Bacteria, UA None seen None seen/Few  Urinalysis, Complete  Result Value Ref Range   Specific Gravity, UA 1.025 1.005 - 1.030   pH, UA 5.0 5.0 - 7.5   Color, UA Yellow Yellow   Appearance Ur Clear Clear   Leukocytes, UA Trace (A) Negative   Protein, UA Negative Negative/Trace   Glucose, UA Negative Negative   Ketones, UA Negative Negative   RBC, UA Trace (A) Negative   Bilirubin, UA Negative Negative   Urobilinogen, Ur 0.2 0.2 - 1.0 mg/dL   Nitrite, UA Negative Negative   Microscopic Examination See below:    Lab Results  Component Value Date   WBC 7.7 05/25/2014   HGB 12.1 05/25/2014   HCT 37.1 05/25/2014   MCV 85.9 05/25/2014   PLT 273.0 05/25/2014    Lab Results  Component Value Date   CREATININE 0.7 05/25/2014    No results found for: PSA  No results found for: TESTOSTERONE  Lab Results  Component Value Date   HGBA1C 5.9 05/25/2014    Urinalysis    Component Value Date/Time   BILIRUBINUR n 04/10/2012 1431   PROTEINUR n 04/10/2012 1431   UROBILINOGEN 0.2 04/10/2012 1431   NITRITE n 04/10/2012 1431   LEUKOCYTESUR Negative 04/10/2012 1431    Pertinent Imaging: Procedure:   Patient is placed in stirrups and her urethral meatus and vulva are cleansed with Betadine.  2% Lidocaine jelly was inserted into her urethra.  I then dilated her with Elby Showers sounds  to a 68f without difficultly.  She tolerated the procedure well.  She will return in 4 months.  Assessment & Plan:    1. 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  She will typically have an UTI 2 to 3 weeks after the dilation, so I have prescribed her Macrobid to have on hand for  this event.  She will follow up in four months time.    - Urinalysis, Complete   No Follow-up on file.  Zara Council, [facility] [address], [place], [place] [address] [phone]

## 2014-11-24 ENCOUNTER — Other Ambulatory Visit: Payer: Self-pay | Admitting: Internal Medicine

## 2014-11-24 MED ORDER — SOLIFENACIN SUCCINATE 5 MG PO TABS: 5.0000 mg | ORAL_TABLET | Freq: Every day | ORAL | Status: DC

## 2015-03-30 ENCOUNTER — Encounter: Payer: Self-pay | Admitting: Urology

## 2015-03-30 ENCOUNTER — Ambulatory Visit (INDEPENDENT_AMBULATORY_CARE_PROVIDER_SITE_OTHER): Payer: 59 | Admitting: Urology

## 2015-03-30 VITALS — BP 162/82 | HR 81 | Ht 66.0 in | Wt 288.5 lb

## 2015-03-30 DIAGNOSIS — N35028 Other post-traumatic urethral stricture, female: Secondary | ICD-10-CM

## 2015-03-30 LAB — URINALYSIS, COMPLETE
BILIRUBIN UA: NEGATIVE
Glucose, UA: NEGATIVE
Ketones, UA: NEGATIVE
Nitrite, UA: NEGATIVE
PROTEIN UA: NEGATIVE
Specific Gravity, UA: 1.025 (ref 1.005–1.030)
UUROB: 0.2 mg/dL (ref 0.2–1.0)
pH, UA: 5.5 (ref 5.0–7.5)

## 2015-03-30 LAB — MICROSCOPIC EXAMINATION: Renal Epithel, UA: NONE SEEN /hpf

## 2015-03-30 MED ORDER — NITROFURANTOIN MONOHYD MACRO 100 MG PO CAPS: 100.0000 mg | ORAL_CAPSULE | Freq: Two times a day (BID) | ORAL | Status: DC

## 2015-03-30 MED ORDER — LIDOCAINE HCL 2 % EX GEL
1.0000 "application " | Freq: Once | CUTANEOUS | Status: AC
  Administered 2015-03-30: 1 via URETHRAL

## 2015-03-30 NOTE — Progress Notes (Signed)
2:56 PM   [place] 07-Jan-1971 [id number]  Referring provider: Jackolyn Confer, MD [address] [place], [place] [address]  Chief Complaint  Patient presents with  . Urethral stricture    Follow up Dilation    HPI: [name] is a 44 y/o white female with a h/o urethral stricture who presents today for a urethral dilation.  She was referred to [place] in 2013 for increased frequency, increased urgency and feelings of not emptying her bladder by Dr. Ronette Deter, M.D.    Per patient, she was diagnosed by Dr. Madelin Headings with urethral dilation and had been receiving serial dilations every 4 to 6 months since 2013.  After she has a dilation, her symptoms of frequency, increased urgency and feelings of not emptying her bladder abate for 3 to 6 months.    More recently, approximately one week ago she started experiencing nocturia 3 and increase in her daytime trips to the bathroom. She states that she feels this is the return of her urethral stricture.  She is not experiencing dysuria, hematuria or suprapubic pain. She has not had any recent fevers, chills, nausea or vomiting.  PMH: Past Medical History  Diagnosis Date  . Heart murmur   . Chicken pox   . Kidney stones   . Endometriosis     Dr. Rushie Chestnut at [facility], removed   . Cervical spine disease   . Chronic urethral narrowing     undergoing stretching  . H/O cystoscopy     normal  . Urinary retention   . Acid reflux   . Arthritis   . Skin cancer     Surgical History: Past Surgical History  Procedure Laterality Date  . Cholecystectomy    . Bladder surgery  07/2003  . Cesarean section  1998  . Abdominal hysterectomy      [facility] complete  . Tonsillectomy and adenoidectomy    . Knee surgery  2005  . Foot surgery  2003    Home Medications:    Medication List       This list is accurate as of: 03/30/15  2:56 PM.  Always use your most recent med list.               CALCIUM 600 + D PO  Take by  mouth.     CALCIUM 600 PO  Take by mouth.     fluconazole 150 MG tablet  Commonly known as:  DIFLUCAN  Take 1 tablet (150 mg total) by mouth once a week.     fluticasone 50 MCG/ACT nasal spray  Commonly known as:  FLONASE  Place into the nose.     L-ARGININE PO  Take by mouth.     loratadine 10 MG tablet  Commonly known as:  CLARITIN  Take 10 mg by mouth daily as needed for allergies.     meloxicam 7.5 MG tablet  Commonly known as:  MOBIC     MENOPAUSE FORMULA Tabs  Take by mouth.     nitrofurantoin (macrocrystal-monohydrate) 100 MG capsule  Commonly known as:  MACROBID  Take 1 capsule (100 mg total) by mouth every 12 (twelve) hours.     nystatin 100000 UNIT/GM Powd  Apply to affected area twice daily     nystatin-triamcinolone ointment  Commonly known as:  MYCOLOG  Apply 1 application topically 2 (two) times daily.     pantoprazole 40 MG tablet  Commonly known as:  PROTONIX  TAKE 1 TABLET (40 MG  TOTAL) BY MOUTH DAILY.     pseudoephedrine 30 MG tablet  Commonly known as:  SUDAFED  Take by mouth.     REMIFEMIN PO  Take by mouth.     Vitamin D (Cholecalciferol) 1000 UNITS Tabs  Take by mouth.     vitamin E 400 UNIT capsule  Take 400 Units by mouth daily.        Allergies: No Known Allergies  Family History: Family History  Problem Relation Age of Onset  . Cancer Maternal Grandmother   . Diabetes Paternal Grandmother     Social History:  reports that she has quit smoking. She does not have any smokeless tobacco history on file. She reports that she does not drink alcohol or use illicit drugs.  ROS: Urological Symptom Review  Patient is experiencing the following symptoms: Frequent urination Hard to postpone urination   Review of Systems  Gastrointestinal (upper)  : Negative for upper GI symptoms  Gastrointestinal (lower) : Negative for lower GI symptoms  Constitutional : Negative for symptoms  Skin: Negative for skin  symptoms  Eyes: Negative for eye symptoms  Ear/Nose/Throat : Negative for Ear/Nose/Throat symptoms  Hematologic/Lymphatic: Negative for Hematologic/Lymphatic symptoms  Cardiovascular : Negative for cardiovascular symptoms  Respiratory : Negative for respiratory symptoms  Endocrine: Negative for endocrine symptoms  Musculoskeletal: Negative for musculoskeletal symptoms  Neurological: Negative for neurological symptoms  Psychologic: Negative for psychiatric symptoms   Physical Exam: Height 5\' 6"  (1.676 m), weight 288 lb 8 oz (130.863 kg). GU:  Normal external genitalia.  Normal urethral meatus. No urethral masses and/or tenderness. No bladder fullness or masses. No vaginal lesions or discharge. Normal rectal tone, no masses. Normal anus and perineum.   Laboratory Data:  Urinalysis Results for orders placed or performed in visit on 11/22/14  Microscopic Examination  Result Value Ref Range   WBC, UA 0-5 0 -  5 /hpf   RBC, UA 0-2 0 -  2 /hpf   Epithelial Cells (non renal) 0-10 0 - 10 /hpf   Crystals Present (A) N/A   Crystal Type Calcium Oxalate N/A   Bacteria, UA None seen None seen/Few  Urinalysis, Complete  Result Value Ref Range   Specific Gravity, UA 1.025 1.005 - 1.030   pH, UA 5.0 5.0 - 7.5   Color, UA Yellow Yellow   Appearance Ur Clear Clear   Leukocytes, UA Trace (A) Negative   Protein, UA Negative Negative/Trace   Glucose, UA Negative Negative   Ketones, UA Negative Negative   RBC, UA Trace (A) Negative   Bilirubin, UA Negative Negative   Urobilinogen, Ur 0.2 0.2 - 1.0 mg/dL   Nitrite, UA Negative Negative   Microscopic Examination See below:      Procedure:   Patient is placed in stirrups and her urethral meatus and vulva are cleansed with Betadine.  2% Lidocaine jelly was inserted into her urethra.  I then dilated her with Elby Showers sounds to a 88f without difficultly.  She tolerated the procedure well.  She will return in 4 months.  Assessment  & Plan:    1. 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  She will typically have an UTI 2 to 3 weeks after the dilation, so I have prescribed her Macrobid to have on hand for this event.  She will follow up in four months time.    - Urinalysis, Complete   Return in about 4 months (around 07/31/2015) for Dilation  .  Amirr Achord,  PA-C  [place] [address] [place], [place] [address] [phone]

## 2015-04-01 ENCOUNTER — Encounter: Payer: Self-pay | Admitting: Physician Assistant

## 2015-04-01 ENCOUNTER — Ambulatory Visit: Payer: Self-pay | Admitting: Physician Assistant

## 2015-04-01 VITALS — BP 138/90 | Temp 98.4°F

## 2015-04-01 DIAGNOSIS — J018 Other acute sinusitis: Secondary | ICD-10-CM

## 2015-04-01 MED ORDER — PREDNISONE 10 MG PO TABS: 30.0000 mg | ORAL_TABLET | Freq: Every day | ORAL | Status: DC

## 2015-04-01 MED ORDER — FLUCONAZOLE 150 MG PO TABS: 150.0000 mg | ORAL_TABLET | Freq: Every day | ORAL | Status: DC

## 2015-04-01 MED ORDER — AMOXICILLIN 875 MG PO TABS: 875.0000 mg | ORAL_TABLET | Freq: Two times a day (BID) | ORAL | Status: DC

## 2015-04-01 NOTE — Progress Notes (Signed)
S: C/o runny nose and congestion for 3 days, hoarse voice, mild cough, no fever, chills, cp/sob, v/d; mucus is brown and thick, cough is sporadic,  Using otc meds:   O: PE:  Vitals wnl, nad perrl eomi, normocephalic, tms dull, nasal mucosa red and swollen, throat injected, neck supple no lymph, lungs c t a, cv rrr, neuro intact  A:  Acute sinusitis   P: amoxil 875mg  bid x 10d, diflucan if needed, prednisone 30mg  qd x 3d, drink fluids, continue regular meds , use otc meds of choice, return if not improving in 5 days, return earlier if worsening

## 2015-04-13 ENCOUNTER — Other Ambulatory Visit: Payer: Self-pay | Admitting: Internal Medicine

## 2015-04-13 DIAGNOSIS — Z1231 Encounter for screening mammogram for malignant neoplasm of breast: Secondary | ICD-10-CM

## 2015-04-21 ENCOUNTER — Other Ambulatory Visit: Payer: Self-pay | Admitting: Internal Medicine

## 2015-04-25 ENCOUNTER — Ambulatory Visit (INDEPENDENT_AMBULATORY_CARE_PROVIDER_SITE_OTHER): Payer: 59 | Admitting: Urology

## 2015-04-25 ENCOUNTER — Encounter: Payer: Self-pay | Admitting: Urology

## 2015-04-25 VITALS — BP 135/81 | HR 80 | Ht 66.0 in | Wt 278.8 lb

## 2015-04-25 DIAGNOSIS — N200 Calculus of kidney: Secondary | ICD-10-CM

## 2015-04-25 DIAGNOSIS — R110 Nausea: Secondary | ICD-10-CM | POA: Diagnosis not present

## 2015-04-25 DIAGNOSIS — R310 Gross hematuria: Secondary | ICD-10-CM

## 2015-04-25 HISTORY — DX: Gross hematuria: R31.0

## 2015-04-25 LAB — URINALYSIS, COMPLETE
BILIRUBIN UA: NEGATIVE
GLUCOSE, UA: NEGATIVE
KETONES UA: NEGATIVE
LEUKOCYTES UA: NEGATIVE
Nitrite, UA: NEGATIVE
Urobilinogen, Ur: 0.2 mg/dL (ref 0.2–1.0)
pH, UA: 5.5 (ref 5.0–7.5)

## 2015-04-25 LAB — MICROSCOPIC EXAMINATION
RBC, UA: >30 /hpf — ABNORMAL HIGH (ref 0–?)
WBC, UA: NONE SEEN /hpf (ref 0–?)

## 2015-04-25 MED ORDER — ONDANSETRON 4 MG PO TBDP: 4.0000 mg | ORAL_TABLET | Freq: Three times a day (TID) | ORAL | Status: DC | PRN

## 2015-04-25 NOTE — Progress Notes (Signed)
04/25/2015 9:39 AM   [place]anda Feb 14, 1971 [id number]  Referring provider: Jackolyn Confer, MD [id number] [place], [place] [address]  Chief Complaint  Patient presents with  . Nephrolithiasis    HPI: Patient is a 44 year old white female with a history of bilateral nephrolithiasis found on a CT Urogram in 11/2012 who presents today for right side pain that radiated to the right flank pain.    Patient stated the pain began last week. It initially started as her not feeling well with nausea and no appetite. The next day she started experience right-sided groin pain that radiated to the right flank. It lasted for a few hours and then abated. 4 days later the pain returned and it was quite intense. It was located in the same area with the same radiation pattern. She vomited that day due to the pain. The pain then abated. Then 3 days ago, she started experience nausea again. The next day the pain returned in its intensity. She had Rapaflo on hand and took one of those medications. The pain lasted a few hours and then abated.  She has been experiencing gross hematuria all week.  She describes it as a pink tinge to her urine with flaky stuff. She has not had fevers but admits to chills, nausea and vomiting.  Her UA today is positive for greater than 30 RBC's per high-power field.   PMH: Past Medical History  Diagnosis Date  . Heart murmur   . Chicken pox   . Kidney stones   . Endometriosis     Dr. Rushie Chestnut at [facility], removed   . Cervical spine disease   . Chronic urethral narrowing     undergoing stretching  . H/O cystoscopy     normal  . Urinary retention   . Acid reflux   . Arthritis   . Skin cancer     Surgical History: Past Surgical History  Procedure Laterality Date  . Cholecystectomy    . Bladder surgery  07/2003  . Cesarean section  1998  . Abdominal hysterectomy      [facility] complete  . Tonsillectomy and adenoidectomy    . Knee surgery  2005  .  Foot surgery  2003    Home Medications:    Medication List       This list is accurate as of: 04/25/15  9:39 AM.  Always use your most recent med list.               ALLEGRA PO  Take by mouth.     CALCIUM 600 PO  Take by mouth.     fluticasone 50 MCG/ACT nasal spray  Commonly known as:  FLONASE  Place into the nose.     L-ARGININE PO  Take by mouth.     meloxicam 7.5 MG tablet  Commonly known as:  MOBIC     MENOPAUSE FORMULA Tabs  Take by mouth.     ondansetron 4 MG disintegrating tablet  Commonly known as:  ZOFRAN ODT  Take 1 tablet (4 mg total) by mouth every 8 (eight) hours as needed for nausea or vomiting.     pantoprazole 40 MG tablet  Commonly known as:  PROTONIX  TAKE 1 TABLET BY MOUTH DAILY     pseudoephedrine 30 MG tablet  Commonly known as:  SUDAFED  Take by mouth.     silodosin 8 MG Caps capsule  Commonly known as:  RAPAFLO  Take 8 mg by  mouth daily with breakfast.     Vitamin D (Cholecalciferol) 1000 UNITS Tabs  Take by mouth.     vitamin E 400 UNIT capsule  Take 400 Units by mouth daily.        Allergies: No Known Allergies  Family History: Family History  Problem Relation Age of Onset  . Cancer Maternal Grandmother   . Diabetes Paternal Grandmother     Social History:  reports that she has quit smoking. She does not have any smokeless tobacco history on file. She reports that she does not drink alcohol or use illicit drugs.  ROS: UROLOGY Frequent Urination?: No Hard to postpone urination?: No Burning/pain with urination?: No Get up at night to urinate?: Yes Leakage of urine?: No Urine stream starts and stops?: No Trouble starting stream?: No Do you have to strain to urinate?: Yes Blood in urine?: Yes Urinary tract infection?: Yes Sexually transmitted disease?: No Injury to kidneys or bladder?: Yes Painful intercourse?: No Weak stream?: Yes Currently pregnant?: No Vaginal bleeding?: No Last menstrual period?:  n  Gastrointestinal Nausea?: Yes Vomiting?: Yes Indigestion/heartburn?: No Diarrhea?: Yes Constipation?: No  Constitutional Fever: No Night sweats?: No Weight loss?: No Fatigue?: No  Skin Skin rash/lesions?: No Itching?: No  Eyes Blurred vision?: No Double vision?: No  Ears/Nose/Throat Sore throat?: No Sinus problems?: No  Hematologic/Lymphatic Swollen glands?: No Easy bruising?: No  Cardiovascular Leg swelling?: No Chest pain?: No  Respiratory Cough?: No Shortness of breath?: No  Endocrine Excessive thirst?: Yes  Musculoskeletal Back pain?: Yes Joint pain?: No  Neurological Headaches?: No Dizziness?: Yes  Psychologic Depression?: No Anxiety?: No  Physical Exam: BP 135/81 mmHg  Pulse 80  Ht 5\' 6"  (1.676 m)  Wt 278 lb 12.8 oz (126.463 kg)  BMI 45.02 kg/m2  Constitutional: Well nourished. Alert and oriented, No acute distress. HEENT: [place] AT, moist mucus membranes. Trachea midline, no masses. Cardiovascular: No clubbing, cyanosis, or edema. Respiratory: Normal respiratory effort, no increased work of breathing. GI: Abdomen is soft, non tender, non distended, no abdominal masses. Liver and spleen not palpable.  No hernias appreciated.  Stool sample for occult testing is not indicated.   GU: No CVA tenderness.  No bladder fullness or masses.   Skin: No rashes, bruises or suspicious lesions. Lymph: No cervical or inguinal adenopathy. Neurologic: Grossly intact, no focal deficits, moving all 4 extremities. Psychiatric: Normal mood and affect.  Laboratory Data: Lab Results  Component Value Date   WBC 7.7 05/25/2014   HGB 12.1 05/25/2014   HCT 37.1 05/25/2014   MCV 85.9 05/25/2014   PLT 273.0 05/25/2014    Lab Results  Component Value Date   CREATININE 0.7 05/25/2014    Lab Results  Component Value Date   HGBA1C 5.9 05/25/2014    Urinalysis Results for orders placed or performed in visit on 04/25/15  Microscopic Examination  Result  Value Ref Range   WBC, UA None seen 0 -  5 /hpf   RBC, UA >30 (H) 0 -  2 /hpf   Epithelial Cells (non renal) 0-10 0 - 10 /hpf   Mucus, UA Present (A) Not Estab.   Bacteria, UA Few (A) None seen/Few  Urinalysis, Complete  Result Value Ref Range   Specific Gravity, UA >1.030 (H) 1.005 - 1.030   pH, UA 5.5 5.0 - 7.5   Color, UA Yellow Yellow   Appearance Ur Clear Clear   Leukocytes, UA Negative Negative   Protein, UA 1+ (A) Negative/Trace   Glucose, UA Negative  Negative   Ketones, UA Negative Negative   RBC, UA 3+ (A) Negative   Bilirubin, UA Negative Negative   Urobilinogen, Ur 0.2 0.2 - 1.0 mg/dL   Nitrite, UA Negative Negative   Microscopic Examination See below:      Assessment & Plan:    1. Kidney stones:   Patient has a history of bilateral nephrolithiasis confirmed by CT urogram performed 2014.  She is currently having right-sided renal colic associated with gross hematuria.  I had given her Rapaflo 8 mg samples to take 1 daily.  I have also given her strainer and instructed to strain her urine.  She will save any fragments for analysis.    - Urinalysis,   2. Gross hematuria:   Explained to patient the causes of blood in the urine are as follows: stones, UTI's, damage to the urinary tract and/or cancer.  It is explained to the patient that they will be scheduled for a CT Urogram with contrast material and that in rare instances, an allergic reaction can be serious and even life threatening with the injection of contrast material.   The patient denies any allergies to contrast, iodine and/or seafood and is not taking metformin.  I will also send her urine for culture in anticipation for a possible surgical intervention for a stone.  I emphasized that even if she passes a stone, I still want her to proceed with the CT urogram.  - BUN and serum creatinine   3. Nausea:   Patient has been having significant nausea. I will send a prescription for Zofran 4 mg ODT to her pharmacy.     Return for CT Urogram report.  Zara Council, [facility] [address], [place], [place] [address] [phone]

## 2015-04-26 LAB — BUN+CREAT
BUN/Creatinine Ratio: 10 (ref 9–23)
BUN: 13 mg/dL (ref 6–24)
CREATININE: 1.31 mg/dL — AB (ref 0.57–1.00)
GFR calc Af Amer: 57 mL/min/{1.73_m2} — ABNORMAL LOW (ref >59)
GFR calc non Af Amer: 49 mL/min/{1.73_m2} — ABNORMAL LOW (ref >59)

## 2015-04-27 ENCOUNTER — Ambulatory Visit
Admission: RE | Admit: 2015-04-27 | Discharge: 2015-04-27 | Disposition: A | Discharge status: Home / Self Care | Payer: 59 | Source: Ambulatory Visit | Attending: Urology | Admitting: Urology

## 2015-04-27 ENCOUNTER — Ambulatory Visit (INDEPENDENT_AMBULATORY_CARE_PROVIDER_SITE_OTHER): Payer: 59 | Admitting: Urology

## 2015-04-27 DIAGNOSIS — N201 Calculus of ureter: Secondary | ICD-10-CM

## 2015-04-27 DIAGNOSIS — N200 Calculus of kidney: Secondary | ICD-10-CM | POA: Insufficient documentation

## 2015-04-27 DIAGNOSIS — R310 Gross hematuria: Secondary | ICD-10-CM

## 2015-04-27 DIAGNOSIS — N132 Hydronephrosis with renal and ureteral calculous obstruction: Secondary | ICD-10-CM | POA: Diagnosis not present

## 2015-04-27 LAB — CULTURE, URINE COMPREHENSIVE

## 2015-04-27 MED ORDER — IOHEXOL 300 MG/ML  SOLN
125.0000 mL | Freq: Once | INTRAMUSCULAR | Status: AC | PRN
  Administered 2015-04-27: 125 mL via INTRAVENOUS

## 2015-04-28 ENCOUNTER — Telehealth: Payer: Self-pay | Admitting: Radiology

## 2015-04-28 ENCOUNTER — Other Ambulatory Visit: Payer: Self-pay

## 2015-04-28 ENCOUNTER — Other Ambulatory Visit: Payer: Self-pay | Admitting: Urology

## 2015-04-28 ENCOUNTER — Encounter: Payer: Self-pay | Admitting: *Deleted

## 2015-04-28 DIAGNOSIS — N201 Calculus of ureter: Secondary | ICD-10-CM

## 2015-04-28 HISTORY — DX: Calculus of ureter: N20.1

## 2015-04-28 MED ORDER — SCOPOLAMINE 1 MG/3DAYS TD PT72: 1.0000 | MEDICATED_PATCH | TRANSDERMAL | Status: DC

## 2015-04-28 NOTE — Telephone Encounter (Signed)
She needs to ask the folks that call her for the phone interview today that question.

## 2015-04-28 NOTE — Telephone Encounter (Signed)
Pt advised to ask Pre-Admit testing during her phone interview about nausea medication. Pt voices understanding.

## 2015-04-28 NOTE — Progress Notes (Signed)
2:23 PM   [place] 1971/03/23 [id number]  Referring provider: Jackolyn Confer, MD [id number] [place], [place] [address]  No chief complaint on file.   HPI: Patient is a 44 year old white female who presents today for her CT Urogram report.  Background history Patient is a 44 year old white female with a history of bilateral nephrolithiasis found on a CT Urogram in 11/2012 who presented for right side pain that radiated to the right flank pain.  Patient stated the pain began last week. It initially started as her not feeling well with nausea and no appetite. The next day she started experience right-sided groin pain that radiated to the right flank. It lasted for a few hours and then abated. 4 days later the pain returned and it was quite intense. It was located in the same area with the same radiation pattern. She vomited that day due to the pain. The pain then abated. Then 3 days ago, she started experience nausea again. The next day the pain returned in its intensity. She had Rapaflo on hand and took one of those medications. The pain lasted a few hours and then abated.  She had been experiencing gross hematuria all week.  She describes it as a pink tinge to her urine with flaky stuff. She has not had fevers but admits to chills, nausea and vomiting.    Today, she is feeling well and does not report any flank pain.  She has not had any passage of fragments.  She is not having fevers, chills, nausea or vomiting.  Her urine culture from 04/25/2015 was negative.    Her CT Urogram demonstrated moderate to severe right hydronephrosis secondary to a stone/stones in the lower right ureter.  I have reviewed the films with the patient.    PMH: Past Medical History  Diagnosis Date  . Heart murmur   . Chicken pox   . Kidney stones   . Endometriosis     Dr. Rushie Chestnut at [facility], removed   . Cervical spine disease   . Chronic urethral narrowing     undergoing  stretching  . H/O cystoscopy     normal  . Urinary retention   . Acid reflux   . Arthritis   . Skin cancer   . Complication of anesthesia   . PONV (postoperative nausea and vomiting)     Surgical History: Past Surgical History  Procedure Laterality Date  . Cholecystectomy    . Bladder surgery  07/2003  . Cesarean section  1998  . Abdominal hysterectomy      [facility] complete  . Tonsillectomy and adenoidectomy    . Knee surgery  2005  . Foot surgery  2003  . Carpal tunnel release Right     Home Medications:    Medication List       This list is accurate as of: 04/27/15 11:59 PM.  Always use your most recent med list.               ALLEGRA PO  Take 1 tablet by mouth every evening.     CALCIUM 600 PO  Take by mouth.     fluticasone 50 MCG/ACT nasal spray  Commonly known as:  FLONASE  Place 1 spray into the nose daily.     L-ARGININE PO  Take by mouth.     meloxicam 7.5 MG tablet  Commonly known as:  MOBIC  Take 7.5 mg by mouth as needed.  MENOPAUSE FORMULA Tabs  Take by mouth.     ondansetron 4 MG disintegrating tablet  Commonly known as:  ZOFRAN ODT  Take 1 tablet (4 mg total) by mouth every 8 (eight) hours as needed for nausea or vomiting.     pantoprazole 40 MG tablet  Commonly known as:  PROTONIX  TAKE 1 TABLET BY MOUTH DAILY     pseudoephedrine 30 MG tablet  Commonly known as:  SUDAFED  Take 30 mg by mouth as needed.     silodosin 8 MG Caps capsule  Commonly known as:  RAPAFLO  Take 8 mg by mouth daily with breakfast.     Vitamin D (Cholecalciferol) 1000 UNITS Tabs  Take by mouth.     vitamin E 400 UNIT capsule  Take 400 Units by mouth daily.        Allergies: No Known Allergies  Family History: Family History  Problem Relation Age of Onset  . Cancer Maternal Grandmother   . Diabetes Paternal Grandmother     Social History:  reports that she quit smoking about 7 years ago. Her smoking use included Cigarettes. She has a 8.5  pack-year smoking history. She does not have any smokeless tobacco history on file. She reports that she does not drink alcohol or use illicit drugs.  ROS: UROLOGY Frequent Urination?: No Hard to postpone urination?: No Burning/pain with urination?: No Get up at night to urinate?: Yes Leakage of urine?: No Urine stream starts and stops?: No Trouble starting stream?: No Do you have to strain to urinate?: Yes Blood in urine?: Yes Urinary tract infection?: Yes Sexually transmitted disease?: No Injury to kidneys or bladder?: Yes Painful intercourse?: No Weak stream?: Yes Currently pregnant?: No Vaginal bleeding?: No Last menstrual period?: n  Gastrointestinal Nausea?: Yes Vomiting?: Yes Indigestion/heartburn?: No Diarrhea?: Yes Constipation?: No  Constitutional Fever: No Night sweats?: No Weight loss?: No Fatigue?: No  Skin Skin rash/lesions?: No Itching?: No  Eyes Blurred vision?: No Double vision?: No  Ears/Nose/Throat Sore throat?: No Sinus problems?: No  Hematologic/Lymphatic Swollen glands?: No Easy bruising?: No  Cardiovascular Leg swelling?: No Chest pain?: No  Respiratory Cough?: No Shortness of breath?: No  Endocrine Excessive thirst?: Yes  Musculoskeletal Back pain?: Yes Joint pain?: No  Neurological Headaches?: No Dizziness?: Yes  Psychologic Depression?: No Anxiety?: No  Physical Exam: There were no vitals taken for this visit.  Constitutional: Well nourished. Alert and oriented, No acute distress. HEENT: [place] AT, moist mucus membranes. Trachea midline, no masses. Cardiovascular: No clubbing, cyanosis, or edema. Respiratory: Normal respiratory effort, no increased work of breathing. GI: Abdomen is soft, non tender, non distended, no abdominal masses. Liver and spleen not palpable.  No hernias appreciated.  Stool sample for occult testing is not indicated.   GU: No CVA tenderness.  No bladder fullness or masses.   Skin: No  rashes, bruises or suspicious lesions. Lymph: No cervical or inguinal adenopathy. Neurologic: Grossly intact, no focal deficits, moving all 4 extremities. Psychiatric: Normal mood and affect.  Laboratory Data: Lab Results  Component Value Date   WBC 7.7 05/25/2014   HGB 12.1 05/25/2014   HCT 37.1 05/25/2014   MCV 85.9 05/25/2014   PLT 273.0 05/25/2014    Lab Results  Component Value Date   CREATININE 1.31* 04/25/2015    Lab Results  Component Value Date   HGBA1C 5.9 05/25/2014    Urinalysis Results for orders placed or performed in visit on 04/25/15  CULTURE, URINE COMPREHENSIVE  Result Value Ref Range  Urine Culture, Comprehensive Final report    Result 1 Comment   Microscopic Examination  Result Value Ref Range   WBC, UA None seen 0 -  5 /hpf   RBC, UA >30 (H) 0 -  2 /hpf   Epithelial Cells (non renal) 0-10 0 - 10 /hpf   Mucus, UA Present (A) Not Estab.   Bacteria, UA Few (A) None seen/Few  Urinalysis, Complete  Result Value Ref Range   Specific Gravity, UA >1.030 (H) 1.005 - 1.030   pH, UA 5.5 5.0 - 7.5   Color, UA Yellow Yellow   Appearance Ur Clear Clear   Leukocytes, UA Negative Negative   Protein, UA 1+ (A) Negative/Trace   Glucose, UA Negative Negative   Ketones, UA Negative Negative   RBC, UA 3+ (A) Negative   Bilirubin, UA Negative Negative   Urobilinogen, Ur 0.2 0.2 - 1.0 mg/dL   Nitrite, UA Negative Negative   Microscopic Examination See below:   BUN+Creat  Result Value Ref Range   BUN 13 6 - 24 mg/dL   Creatinine, Ser 1.31 (H) 0.57 - 1.00 mg/dL   GFR calc non Af Amer 49 (L) >59 mL/min/1.73   GFR calc Af Amer 57 (L) >59 mL/min/1.73   BUN/Creatinine Ratio 10 9 - 23     Assessment & Plan:    1. Right ureteral stone:   Patient was found to have a right ureteral stone with moderate to severe hydronephrosis on CT.  She will undergo right URS/LL/right ureteral stent placement for definitive management.    I explained to the patient how the  procedure is performed and the risks involved.    I informed patient that she will have a stent placed during the procedure and will remain in place after the procedure for a short time.  It will be removed in the office with a cystoscope, unless a string in left in place.  I informed that patient that about 50% of patients who undergo ureteroscopy and have a stent will have "stent pain," and this is by far the most common risk/complaint following ureteroscopy. A stent is a soft plastic tube (about half the size of IV tubing) that allows the kidney to drain to the bladder regardless of edema or obstruction. Not only can the stent "rub" on the inside of the bladder, causing a feeling of needing to urinate/overactive bladder, but also the stent allows urine to pass up from the bladder to the kidney during urination - causing symptoms from a warm, tingling sensation to intense pain in the affected flank.   They may be residual stones within the kidney or ureter may be present up to 40% of the time following ureteroscopy, depending on the original stone size and location. These stone fragments will be seen and addressed on follow-up imaging.  Injury to the ureter is the most common intra-operative complication during ureteroscopy. The reported risk of perforation ranges greatly, depending on whether it is defined as a complete perforation (0.1-0.7% - think of this as a hole through the entire ureter), a partial perforation (1.6% - a hole nearly through the entire ureter), or mucosal tear/scrape (5% - these are similar to a sore on the inside of the mouth). Almost 100% of these will heal with prolonged stenting (anywhere between 2 - 4 weeks). Should a large perforation occur, your urologist may chose to stop the procedure and return on another day when the ureter has had time to heal.  2. Gross hematuria:  Patient has completed her CT Urogram and was found to have a right ureteral stone causing obstruction.  She  will be undergoing a right URS/LL/ right ureteral stent placement.  Return for right URS/LL/right ureteral.  Zara Council, [facility] [address] [place], [place] [address] [phone]

## 2015-04-28 NOTE — Telephone Encounter (Signed)
Pt states she gets nauseated with anesthesia.  She has Zofran at home and asked if she can take it in the morning before her procedure or will they give her something at the hospital? Please advise.

## 2015-04-28 NOTE — Patient Instructions (Addendum)
  Your procedure is scheduled on: 04-29-15 Report to [facility] @ 9:30 PER PT   Remember: Instructions that are not followed completely may result in serious medical risk, up to and including death, or upon the discretion of your surgeon and anesthesiologist your surgery may need to be rescheduled.    _X___ 1. Do not eat food or drink liquids after midnight. No gum chewing or hard candies.     _X___ 2. No Alcohol for 24 hours before or after surgery.   ____ 3. Bring all medications with you on the day of surgery if instructed.    ____ 4. Notify your doctor if there is any change in your medical condition     (cold, fever, infections).     Do not wear jewelry, make-up, hairpins, clips or nail polish.  Do not wear lotions, powders, or perfumes. You may wear deodorant.  Do not shave 48 hours prior to surgery. Men may shave face and neck.  Do not bring valuables to the hospital.    [facility] is not responsible for any belongings or valuables.               Contacts, dentures or bridgework may not be worn into surgery.  Leave your suitcase in the car. After surgery it may be brought to your room.  For patients admitted to the hospital, discharge time is determined by your treatment team.   Patients discharged the day of surgery will not be allowed to drive home.   Please read over the following fact sheets that you were given:      _X___ Take these medicines the morning of surgery with A SIP OF WATER:    1. RAPAFLO  2.  PROTONIX  3. TAKE AN EXTRA PROTONIX TONIGHT  4.  5.  6.  ____ Fleet Enema (as directed)   ____ Use CHG Soap as directed  ____ Use inhalers on the day of surgery  ____ Stop metformin 2 days prior to surgery    ____ Take 1/2 of usual insulin dose the night before surgery and none on the morning of surgery.   ____ Stop Coumadin/Plavix/aspirin-N/A  ____ Stop Anti-inflammatories-NO NSAIDS OR ASA PRODUCTS-TYLENOL OK   _X___ Stop  supplements until after surgery-STOP VITAMIN E AND L-ARGININE NOW   ____ Bring C-Pap to the hospital.

## 2015-04-29 ENCOUNTER — Ambulatory Visit: Payer: 59 | Admitting: Anesthesiology

## 2015-04-29 ENCOUNTER — Encounter
Admission: RE | Disposition: A | Discharge status: Home / Self Care | Payer: Self-pay | Source: Ambulatory Visit | Attending: Urology

## 2015-04-29 ENCOUNTER — Ambulatory Visit
Admission: RE | Admit: 2015-04-29 | Discharge: 2015-04-29 | Disposition: A | Discharge status: Home / Self Care | Payer: 59 | Source: Ambulatory Visit | Attending: Urology | Admitting: Urology

## 2015-04-29 ENCOUNTER — Encounter: Payer: Self-pay | Admitting: *Deleted

## 2015-04-29 DIAGNOSIS — M1990 Unspecified osteoarthritis, unspecified site: Secondary | ICD-10-CM | POA: Insufficient documentation

## 2015-04-29 DIAGNOSIS — K219 Gastro-esophageal reflux disease without esophagitis: Secondary | ICD-10-CM | POA: Insufficient documentation

## 2015-04-29 DIAGNOSIS — R310 Gross hematuria: Secondary | ICD-10-CM | POA: Diagnosis not present

## 2015-04-29 DIAGNOSIS — N200 Calculus of kidney: Secondary | ICD-10-CM | POA: Diagnosis not present

## 2015-04-29 DIAGNOSIS — Z87891 Personal history of nicotine dependence: Secondary | ICD-10-CM | POA: Insufficient documentation

## 2015-04-29 DIAGNOSIS — Z6841 Body Mass Index (BMI) 40.0 and over, adult: Secondary | ICD-10-CM | POA: Diagnosis not present

## 2015-04-29 DIAGNOSIS — Z9049 Acquired absence of other specified parts of digestive tract: Secondary | ICD-10-CM | POA: Diagnosis not present

## 2015-04-29 DIAGNOSIS — R339 Retention of urine, unspecified: Secondary | ICD-10-CM | POA: Insufficient documentation

## 2015-04-29 DIAGNOSIS — Z87442 Personal history of urinary calculi: Secondary | ICD-10-CM | POA: Insufficient documentation

## 2015-04-29 DIAGNOSIS — N359 Urethral stricture, unspecified: Secondary | ICD-10-CM | POA: Diagnosis not present

## 2015-04-29 DIAGNOSIS — R011 Cardiac murmur, unspecified: Secondary | ICD-10-CM | POA: Diagnosis not present

## 2015-04-29 DIAGNOSIS — Z90710 Acquired absence of both cervix and uterus: Secondary | ICD-10-CM | POA: Diagnosis not present

## 2015-04-29 DIAGNOSIS — Z79899 Other long term (current) drug therapy: Secondary | ICD-10-CM | POA: Insufficient documentation

## 2015-04-29 DIAGNOSIS — N132 Hydronephrosis with renal and ureteral calculous obstruction: Secondary | ICD-10-CM | POA: Insufficient documentation

## 2015-04-29 DIAGNOSIS — N202 Calculus of kidney with calculus of ureter: Secondary | ICD-10-CM | POA: Diagnosis present

## 2015-04-29 DIAGNOSIS — Z85828 Personal history of other malignant neoplasm of skin: Secondary | ICD-10-CM | POA: Diagnosis not present

## 2015-04-29 DIAGNOSIS — Z809 Family history of malignant neoplasm, unspecified: Secondary | ICD-10-CM | POA: Diagnosis not present

## 2015-04-29 DIAGNOSIS — Z833 Family history of diabetes mellitus: Secondary | ICD-10-CM | POA: Insufficient documentation

## 2015-04-29 DIAGNOSIS — N201 Calculus of ureter: Secondary | ICD-10-CM | POA: Diagnosis not present

## 2015-04-29 HISTORY — DX: Other specified postprocedural states: Z98.890

## 2015-04-29 HISTORY — DX: Other complications of anesthesia, initial encounter: T88.59XA

## 2015-04-29 HISTORY — DX: Nausea with vomiting, unspecified: R11.2

## 2015-04-29 HISTORY — PX: CYSTOSCOPY WITH STENT PLACEMENT: SHX5790

## 2015-04-29 HISTORY — DX: Adverse effect of unspecified anesthetic, initial encounter: T41.45XA

## 2015-04-29 SURGERY — CYSTOSCOPY, WITH STENT INSERTION
Anesthesia: General | Laterality: Right | Wound class: Clean Contaminated

## 2015-04-29 MED ORDER — DOCUSATE SODIUM 100 MG PO CAPS: 100.0000 mg | ORAL_CAPSULE | Freq: Two times a day (BID) | ORAL | Status: DC

## 2015-04-29 MED ORDER — ONDANSETRON HCL 4 MG/2ML IJ SOLN
INTRAMUSCULAR | Status: AC
  Administered 2015-04-29: 4 mg via INTRAVENOUS
  Filled 2015-04-29: qty 2

## 2015-04-29 MED ORDER — ONDANSETRON HCL 4 MG/2ML IJ SOLN
4.0000 mg | Freq: Once | INTRAMUSCULAR | Status: AC | PRN
  Administered 2015-04-29: 4 mg via INTRAVENOUS

## 2015-04-29 MED ORDER — MIDAZOLAM HCL 2 MG/2ML IJ SOLN
INTRAMUSCULAR | Status: DC | PRN
  Administered 2015-04-29: 2 mg via INTRAVENOUS

## 2015-04-29 MED ORDER — FENTANYL CITRATE (PF) 100 MCG/2ML IJ SOLN
INTRAMUSCULAR | Status: DC | PRN
  Administered 2015-04-29: 100 ug via INTRAVENOUS

## 2015-04-29 MED ORDER — PROPOFOL 10 MG/ML IV BOLUS
INTRAVENOUS | Status: DC | PRN
  Administered 2015-04-29: 200 mg via INTRAVENOUS

## 2015-04-29 MED ORDER — ONDANSETRON HCL 4 MG/2ML IJ SOLN
INTRAMUSCULAR | Status: DC | PRN
  Administered 2015-04-29: 4 mg via INTRAVENOUS

## 2015-04-29 MED ORDER — KETOROLAC TROMETHAMINE 30 MG/ML IJ SOLN
INTRAMUSCULAR | Status: DC | PRN
  Administered 2015-04-29: 30 mg via INTRAVENOUS

## 2015-04-29 MED ORDER — CEFAZOLIN SODIUM 1-5 GM-% IV SOLN
INTRAVENOUS | Status: AC
  Administered 2015-04-29: 1 g via INTRAVENOUS
  Filled 2015-04-29: qty 50

## 2015-04-29 MED ORDER — SUGAMMADEX SODIUM 500 MG/5ML IV SOLN
INTRAVENOUS | Status: DC | PRN
  Administered 2015-04-29: 252.2 mg via INTRAVENOUS

## 2015-04-29 MED ORDER — DEXAMETHASONE SODIUM PHOSPHATE 4 MG/ML IJ SOLN
INTRAMUSCULAR | Status: DC | PRN
  Administered 2015-04-29: 10 mg via INTRAVENOUS

## 2015-04-29 MED ORDER — SUCCINYLCHOLINE CHLORIDE 20 MG/ML IJ SOLN
INTRAMUSCULAR | Status: DC | PRN
  Administered 2015-04-29: 100 mg via INTRAVENOUS

## 2015-04-29 MED ORDER — CEFAZOLIN SODIUM 1-5 GM-% IV SOLN
1.0000 g | Freq: Once | INTRAVENOUS | Status: AC
  Administered 2015-04-29: 1 g via INTRAVENOUS

## 2015-04-29 MED ORDER — ROCURONIUM BROMIDE 100 MG/10ML IV SOLN
INTRAVENOUS | Status: DC | PRN
  Administered 2015-04-29: 30 mg via INTRAVENOUS

## 2015-04-29 MED ORDER — FENTANYL CITRATE (PF) 100 MCG/2ML IJ SOLN: 25.0000 ug | INTRAMUSCULAR | Status: DC | PRN

## 2015-04-29 MED ORDER — CIPROFLOXACIN HCL 500 MG PO TABS: 500.0000 mg | ORAL_TABLET | Freq: Two times a day (BID) | ORAL | Status: DC

## 2015-04-29 MED ORDER — HYDROCODONE-ACETAMINOPHEN 5-325 MG PO TABS: 1.0000 | ORAL_TABLET | Freq: Four times a day (QID) | ORAL | Status: DC | PRN

## 2015-04-29 MED ORDER — SODIUM CHLORIDE 0.9 % IR SOLN
Status: DC | PRN
  Administered 2015-04-29: 1000 mL via INTRAVESICAL

## 2015-04-29 MED ORDER — LACTATED RINGERS IV SOLN
INTRAVENOUS | Status: DC
  Administered 2015-04-29: 10:00:00 via INTRAVENOUS

## 2015-04-29 SURGICAL SUPPLY — 25 items
BACTOSHIELD CHG 4% 4OZ (MISCELLANEOUS) ×1
BASKET ZERO TIP 1.9FR (BASKET) IMPLANT
CATH URETL 5X70 OPEN END (CATHETERS) IMPLANT
CNTNR SPEC 2.5X3XGRAD LEK (MISCELLANEOUS) ×1
CONT SPEC 4OZ STER OR WHT (MISCELLANEOUS) ×1
CONTAINER SPEC 2.5X3XGRAD LEK (MISCELLANEOUS) ×1 IMPLANT
FEE TECHNICIAN ONLY PER HOUR (MISCELLANEOUS) IMPLANT
GLOVE BIO SURGEON STRL SZ7 (GLOVE) ×4 IMPLANT
GLOVE BIO SURGEON STRL SZ7.5 (GLOVE) ×2 IMPLANT
GOWN STRL REUS W/ TWL LRG LVL4 (GOWN DISPOSABLE) ×1 IMPLANT
GOWN STRL REUS W/TWL LRG LVL4 (GOWN DISPOSABLE) ×1
GOWN STRL REUS W/TWL XL LVL3 (GOWN DISPOSABLE) ×2 IMPLANT
GUIDEWIRE SUPER STIFF (WIRE) IMPLANT
KIT RM TURNOVER CYSTO AR (KITS) ×2 IMPLANT
LASER HOLMIUM FIBER SU 272UM (MISCELLANEOUS) IMPLANT
PACK CYSTO AR (MISCELLANEOUS) ×2 IMPLANT
SCRUB CHG 4% DYNA-HEX 4OZ (MISCELLANEOUS) ×1 IMPLANT
SENSORWIRE 0.038 NOT ANGLED (WIRE)
SET CYSTO W/LG BORE CLAMP LF (SET/KITS/TRAYS/PACK) IMPLANT
SHEATH URETERAL 13/15X36 1L (SHEATH) IMPLANT
STENT URET 6FRX24 CONTOUR (STENTS) IMPLANT
STENT URET 6FRX26 CONTOUR (STENTS) ×2 IMPLANT
SURGILUBE 2OZ TUBE FLIPTOP (MISCELLANEOUS) ×2 IMPLANT
SYRINGE IRR TOOMEY STRL 70CC (SYRINGE) ×2 IMPLANT
WIRE SENSOR 0.038 NOT ANGLED (WIRE) IMPLANT

## 2015-04-29 NOTE — Interval H&P Note (Signed)
History and Physical Interval Note:  04/29/2015 10:42 AM  Latoya Lutz  has presented today for surgery, with the diagnosis of stone  The various methods of treatment have been discussed with the patient and family. After consideration of risks, benefits and other options for treatment, the patient has consented to  Procedure(s): URETEROSCOPY WITH HOLMIUM LASER LITHOTRIPSY (Right) CYSTOSCOPY WITH STENT PLACEMENT (Right) as a surgical intervention .  The patient's history has been reviewed, patient examined, no change in status, stable for surgery.  I have reviewed the patient's chart and labs.  Questions were answered to the patient's satisfaction.    RRR Unlabored respirations  Horald Pollen [place]

## 2015-04-29 NOTE — Anesthesia Procedure Notes (Signed)
Procedure Name: Intubation Date/Time: 04/29/2015 11:03 AM Performed by: Jonna Clark Pre-anesthesia Checklist: Patient identified, Patient being monitored, Timeout performed, Emergency Drugs available and Suction available Patient Re-evaluated:Patient Re-evaluated prior to inductionOxygen Delivery Method: Circle system utilized Preoxygenation: Pre-oxygenation with 100% oxygen Intubation Type: IV induction Ventilation: Mask ventilation without difficulty Laryngoscope Size: Mac and 3 Grade View: Grade I Tube type: Oral Tube size: 7.0 mm Number of attempts: 1 Placement Confirmation: positive ETCO2 and breath sounds checked- equal and bilateral Secured at: 21 cm Tube secured with: Tape Dental Injury: Teeth and Oropharynx as per pre-operative assessment

## 2015-04-29 NOTE — Anesthesia Preprocedure Evaluation (Signed)
Anesthesia Evaluation  Patient identified by MRN, date of birth, ID band Patient awake    Reviewed: Allergy & Precautions, NPO status , Patient's Chart, lab work & pertinent test results  History of Anesthesia Complications (+) PONV and history of anesthetic complications  Airway Mallampati: III  TM Distance: >3 FB Neck ROM: Full    Dental no notable dental hx.    Pulmonary former smoker,    Pulmonary exam normal breath sounds clear to auscultation       Cardiovascular negative cardio ROS Normal cardiovascular exam+ Valvular Problems/Murmurs      Neuro/Psych negative neurological ROS  negative psych ROS   GI/Hepatic Neg liver ROS, GERD  Medicated and Controlled,  Endo/Other  negative endocrine ROS  Renal/GU Renal stones  negative genitourinary   Musculoskeletal  (+) Arthritis , Osteoarthritis,    Abdominal Normal abdominal exam  (+)   Peds negative pediatric ROS (+)  Hematology negative hematology ROS (+)   Anesthesia Other Findings Morbid obesity  Reproductive/Obstetrics                             Anesthesia Physical Anesthesia Plan  ASA: III  Anesthesia Plan: General   Post-op Pain Management:    Induction: Intravenous, Rapid sequence and Cricoid pressure planned  Airway Management Planned: Oral ETT  Additional Equipment:   Intra-op Plan:   Post-operative Plan: Extubation in OR  Informed Consent: I have reviewed the patients History and Physical, chart, labs and discussed the procedure including the risks, benefits and alternatives for the proposed anesthesia with the patient or authorized representative who has indicated his/her understanding and acceptance.   Dental advisory given  Plan Discussed with: CRNA and Surgeon  Anesthesia Plan Comments:         Anesthesia Quick Evaluation

## 2015-04-29 NOTE — Transfer of Care (Signed)
Immediate Anesthesia Transfer of Care Note  Patient: Latoya Lutz  Procedure(s) Performed: Procedure(s): CYSTOSCOPY WITH STENT PLACEMENT (Right)  Patient Location: PACU  Anesthesia Type:General  Level of Consciousness: awake, alert  and oriented  Airway & Oxygen Therapy: Patient Spontanous Breathing and Patient connected to face mask oxygen  Post-op Assessment: Report given to RN and Post -op Vital signs reviewed and stable  Post vital signs: Reviewed and stable  Last Vitals:  Filed Vitals:   04/29/15 1143  BP: 173/85  Pulse: 66  Temp: 36.5 C  Resp: 12    Complications: No apparent anesthesia complications

## 2015-04-29 NOTE — Discharge Instructions (Signed)
°  AMBULATORY SURGERY  DISCHARGE INSTRUCTIONS   1) The drugs that you were given will stay in your system until tomorrow so for the next 24 hours you should not:  A) Drive an automobile B) Make any legal decisions C) Drink any alcoholic beverage   2) You may resume regular meals tomorrow.  Today it is better to start with liquids and gradually work up to solid foods.  You may eat anything you prefer, but it is better to start with liquids, then soup and crackers, and gradually work up to solid foods.   3) Please notify your doctor immediately if you have any unusual bleeding, trouble breathing, redness and pain at the surgery site, drainage, fever, or pain not relieved by medication.    4) Additional Instructions:        Please contact your physician with any problems or Same Day Surgery at [phone], Monday through Friday 6 am to 4 pm, or [place] at [facility] number at [phone].   Cystoscopy, Care After Refer to this sheet in the next few weeks. These instructions provide you with information on caring for yourself after your procedure. Your caregiver may also give you more specific instructions. Your treatment has been planned according to current medical practices, but problems sometimes occur. Call your caregiver if you have any problems or questions after your procedure. HOME CARE INSTRUCTIONS  Things you can do to ease any discomfort after your procedure include:  Drinking enough water and fluids to keep your urine clear or pale yellow.  Taking a warm bath to relieve any burning feelings. SEEK IMMEDIATE MEDICAL CARE IF:   You have an increase in blood in your urine.  You notice blood clots in your urine.  You have difficulty passing urine.  You have the chills.  You have abdominal pain.  You have a fever or persistent symptoms for more than 2-3 days.  You have a fever and your symptoms suddenly get worse. MAKE SURE YOU:   Understand these  instructions.  Will watch your condition.  Will get help right away if you are not doing well or get worse.   This information is not intended to replace advice given to you by your health care provider. Make sure you discuss any questions you have with your health care provider.   Document Released: 12/15/2004 Document Revised: 06/18/2014 Document Reviewed: 11/19/2011 Elsevier Interactive Patient Education [employer].

## 2015-04-29 NOTE — H&P (View-Only) (Signed)
2:23 PM   [place] [birth year] QB:[id number]  Referring provider: Jackolyn Confer, MD [id number] [place], [place] [address]  No chief complaint on file.   HPI: Patient is a 44 year old white female who presents today for her CT Urogram report.  Background history Patient is a 44 year old white female with a history of bilateral nephrolithiasis found on a CT Urogram in 11/2012 who presented for right side pain that radiated to the right flank pain.  Patient stated the pain began last week. It initially started as her not feeling well with nausea and no appetite. The next day she started experience right-sided groin pain that radiated to the right flank. It lasted for a few hours and then abated. 4 days later the pain returned and it was quite intense. It was located in the same area with the same radiation pattern. She vomited that day due to the pain. The pain then abated. Then 3 days ago, she started experience nausea again. The next day the pain returned in its intensity. She had Rapaflo on hand and took one of those medications. The pain lasted a few hours and then abated.  She had been experiencing gross hematuria all week.  She describes it as a pink tinge to her urine with flaky stuff. She has not had fevers but admits to chills, nausea and vomiting.    Today, she is feeling well and does not report any flank pain.  She has not had any passage of fragments.  She is not having fevers, chills, nausea or vomiting.  Her urine culture from 04/25/2015 was negative.    Her CT Urogram demonstrated moderate to severe right hydronephrosis secondary to a stone/stones in the lower right ureter.  I have reviewed the films with the patient.    PMH: Past Medical History  Diagnosis Date  . Heart murmur   . Chicken pox   . Kidney stones   . Endometriosis     Dr. Rushie Chestnut at [facility], removed   . Cervical spine disease   . Chronic urethral narrowing     undergoing  stretching  . H/O cystoscopy     normal  . Urinary retention   . Acid reflux   . Arthritis   . Skin cancer   . Complication of anesthesia   . PONV (postoperative nausea and vomiting)     Surgical History: Past Surgical History  Procedure Laterality Date  . Cholecystectomy    . Bladder surgery  07/2003  . Cesarean section  1998  . Abdominal hysterectomy      [facility] complete  . Tonsillectomy and adenoidectomy    . Knee surgery  2005  . Foot surgery  2003  . Carpal tunnel release Right     Home Medications:    Medication List       This list is accurate as of: 04/27/15 11:59 PM.  Always use your most recent med list.               ALLEGRA PO  Take 1 tablet by mouth every evening.     CALCIUM 600 PO  Take by mouth.     fluticasone 50 MCG/ACT nasal spray  Commonly known as:  FLONASE  Place 1 spray into the nose daily.     L-ARGININE PO  Take by mouth.     meloxicam 7.5 MG tablet  Commonly known as:  MOBIC  Take 7.5 mg by mouth as needed.  MENOPAUSE FORMULA Tabs  Take by mouth.     ondansetron 4 MG disintegrating tablet  Commonly known as:  ZOFRAN ODT  Take 1 tablet (4 mg total) by mouth every 8 (eight) hours as needed for nausea or vomiting.     pantoprazole 40 MG tablet  Commonly known as:  PROTONIX  TAKE 1 TABLET BY MOUTH DAILY     pseudoephedrine 30 MG tablet  Commonly known as:  SUDAFED  Take 30 mg by mouth as needed.     silodosin 8 MG Caps capsule  Commonly known as:  RAPAFLO  Take 8 mg by mouth daily with breakfast.     Vitamin D (Cholecalciferol) 1000 UNITS Tabs  Take by mouth.     vitamin E 400 UNIT capsule  Take 400 Units by mouth daily.        Allergies: No Known Allergies  Family History: Family History  Problem Relation Age of Onset  . Cancer Maternal Grandmother   . Diabetes Paternal Grandmother     Social History:  reports that she quit smoking about 7 years ago. Her smoking use included Cigarettes. She has a 8.5  pack-year smoking history. She does not have any smokeless tobacco history on file. She reports that she does not drink alcohol or use illicit drugs.  ROS: UROLOGY Frequent Urination?: No Hard to postpone urination?: No Burning/pain with urination?: No Get up at night to urinate?: Yes Leakage of urine?: No Urine stream starts and stops?: No Trouble starting stream?: No Do you have to strain to urinate?: Yes Blood in urine?: Yes Urinary tract infection?: Yes Sexually transmitted disease?: No Injury to kidneys or bladder?: Yes Painful intercourse?: No Weak stream?: Yes Currently pregnant?: No Vaginal bleeding?: No Last menstrual period?: n  Gastrointestinal Nausea?: Yes Vomiting?: Yes Indigestion/heartburn?: No Diarrhea?: Yes Constipation?: No  Constitutional Fever: No Night sweats?: No Weight loss?: No Fatigue?: No  Skin Skin rash/lesions?: No Itching?: No  Eyes Blurred vision?: No Double vision?: No  Ears/Nose/Throat Sore throat?: No Sinus problems?: No  Hematologic/Lymphatic Swollen glands?: No Easy bruising?: No  Cardiovascular Leg swelling?: No Chest pain?: No  Respiratory Cough?: No Shortness of breath?: No  Endocrine Excessive thirst?: Yes  Musculoskeletal Back pain?: Yes Joint pain?: No  Neurological Headaches?: No Dizziness?: Yes  Psychologic Depression?: No Anxiety?: No  Physical Exam: There were no vitals taken for this visit.  Constitutional: Well nourished. Alert and oriented, No acute distress. HEENT: [place] AT, moist mucus membranes. Trachea midline, no masses. Cardiovascular: No clubbing, cyanosis, or edema. Respiratory: Normal respiratory effort, no increased work of breathing. GI: Abdomen is soft, non tender, non distended, no abdominal masses. Liver and spleen not palpable.  No hernias appreciated.  Stool sample for occult testing is not indicated.   GU: No CVA tenderness.  No bladder fullness or masses.   Skin: No  rashes, bruises or suspicious lesions. Lymph: No cervical or inguinal adenopathy. Neurologic: Grossly intact, no focal deficits, moving all 4 extremities. Psychiatric: Normal mood and affect.  Laboratory Data: Lab Results  Component Value Date   WBC 7.7 05/25/2014   HGB 12.1 05/25/2014   HCT 37.1 05/25/2014   MCV 85.9 05/25/2014   PLT 273.0 05/25/2014    Lab Results  Component Value Date   CREATININE 1.31* 04/25/2015    Lab Results  Component Value Date   HGBA1C 5.9 05/25/2014    Urinalysis Results for orders placed or performed in visit on 04/25/15  CULTURE, URINE COMPREHENSIVE  Result Value Ref Range  Urine Culture, Comprehensive Final report    Result 1 Comment   Microscopic Examination  Result Value Ref Range   WBC, UA None seen 0 -  5 /hpf   RBC, UA >30 (H) 0 -  2 /hpf   Epithelial Cells (non renal) 0-10 0 - 10 /hpf   Mucus, UA Present (A) Not Estab.   Bacteria, UA Few (A) None seen/Few  Urinalysis, Complete  Result Value Ref Range   Specific Gravity, UA >1.030 (H) 1.005 - 1.030   pH, UA 5.5 5.0 - 7.5   Color, UA Yellow Yellow   Appearance Ur Clear Clear   Leukocytes, UA Negative Negative   Protein, UA 1+ (A) Negative/Trace   Glucose, UA Negative Negative   Ketones, UA Negative Negative   RBC, UA 3+ (A) Negative   Bilirubin, UA Negative Negative   Urobilinogen, Ur 0.2 0.2 - 1.0 mg/dL   Nitrite, UA Negative Negative   Microscopic Examination See below:   BUN+Creat  Result Value Ref Range   BUN 13 6 - 24 mg/dL   Creatinine, Ser 1.31 (H) 0.57 - 1.00 mg/dL   GFR calc non Af Amer 49 (L) >59 mL/min/1.73   GFR calc Af Amer 57 (L) >59 mL/min/1.73   BUN/Creatinine Ratio 10 9 - 23     Assessment & Plan:    1. Right ureteral stone:   Patient was found to have a right ureteral stone with moderate to severe hydronephrosis on CT.  She will undergo right URS/LL/right ureteral stent placement for definitive management.    I explained to the patient how the  procedure is performed and the risks involved.    I informed patient that she will have a stent placed during the procedure and will remain in place after the procedure for a short time.  It will be removed in the office with a cystoscope, unless a string in left in place.  I informed that patient that about 50% of patients who undergo ureteroscopy and have a stent will have "stent pain," and this is by far the most common risk/complaint following ureteroscopy. A stent is a soft plastic tube (about half the size of IV tubing) that allows the kidney to drain to the bladder regardless of edema or obstruction. Not only can the stent "rub" on the inside of the bladder, causing a feeling of needing to urinate/overactive bladder, but also the stent allows urine to pass up from the bladder to the kidney during urination - causing symptoms from a warm, tingling sensation to intense pain in the affected flank.   They may be residual stones within the kidney or ureter may be present up to 40% of the time following ureteroscopy, depending on the original stone size and location. These stone fragments will be seen and addressed on follow-up imaging.  Injury to the ureter is the most common intra-operative complication during ureteroscopy. The reported risk of perforation ranges greatly, depending on whether it is defined as a complete perforation (0.1-0.7% - think of this as a hole through the entire ureter), a partial perforation (1.6% - a hole nearly through the entire ureter), or mucosal tear/scrape (5% - these are similar to a sore on the inside of the mouth). Almost 100% of these will heal with prolonged stenting (anywhere between 2 - 4 weeks). Should a large perforation occur, your urologist may chose to stop the procedure and return on another day when the ureter has had time to heal.  2. Gross hematuria:  Patient has completed her CT Urogram and was found to have a right ureteral stone causing obstruction.  She  will be undergoing a right URS/LL/ right ureteral stent placement.  Return for right URS/LL/right ureteral.  Zara Council, [facility] [address] [place], [place] [address] [phone]

## 2015-04-29 NOTE — Op Note (Signed)
Date of procedure: 04/29/2015  Preoperative diagnosis:  1. Right ureteral calculus 2. Left nonobstructing renal calculus 3. Hematuria  Postoperative diagnosis:  1. Same   Procedure: 1. Cystoscopy 2. Right retrograde pyelogram with interpretation 3. Attempted right ureteroscopy 4. Right ureteral stent placement (6 [place] by 26 cm)  Surgeon: Baruch Gouty, MD  Anesthesia: General  Complications: None  Intraoperative findings: There was a filling defect in the mid right ureter consistent with the known right ureteral stone. There is hydroureteronephrosis proximal to this. At the end of the case, fluoroscopy showed correct placement of the right ureteral stent. During right ureteroscopy the ureter was too narrow to safely navigate the ureteroscope through.  EBL: None  Specimens: None  Drains: 6 French by 26 cm double-J ureteral stent on the right side  Disposition: Stable to the postanesthesia care unit  Indication for procedure: The patient is a 44 y.o. female with right ureteral calculus with hydroureteronephrosis, left nonobstructing renal calculus, and hematuria who presents for cystoscopy, right ureteroscopy right retrograde pyelogram, and right ureteral stent placement.  After reviewing the management options for treatment, the patient elected to proceed with the above surgical procedure(s). We have discussed the potential benefits and risks of the procedure, side effects of the proposed treatment, the likelihood of the patient achieving the goals of the procedure, and any potential problems that might occur during the procedure or recuperation. Informed consent has been obtained.  Description of procedure: The patient was met in the preoperative area. All risks, benefits, and indications of the procedure were described in great detail. The patient consented to the procedure. Preoperative antibiotics were given. The patient was taken to the operative theater. General anesthesia  was induced per the anesthesia service. The patient was then placed in the dorsal lithotomy position and prepped and draped in the usual sterile fashion. A preoperative timeout was called. A 21 French 30 cystoscope was inserted into the patient's bladder per urethra atraumatically. Pan cystoscopy was unremarkable. The right ureteral orifice was visualized and intubated with a open-ended catheter. Retrograde pyelogram was obtained. This showed hydronephrosis proximal to a mid right ureteral stone. Sensor wire was then placed with some difficulty to the right renal pelvis as it was difficult to pass sensor wire past the stone. The cystoscope was withdrawn. A semirigid ureteroscope was then inserted in the patient's bladder per urethra atraumatically. The right ureter orifice was visualized and was very narrow. With some difficulty, I was able to navigate into the right ureter. However right ureter was very narrow, and was unable to advance the scope further due to resistance from the narrow ureter. At this time, it was decided to abort the ureteroscopy and place a ureteral stent. There is scope was withdrawn. The cystoscope with a simple of the sensor wire. A 6 French by 26 cm double-J ureteral stent was placed over the sensor wire. Of note, it was very difficult to push the stent passed up the right ureteral stone due to how narrow the ureter was balloon were alternately successful. Sensor wire was then removed. The correct placement of the stent was confirmed to curl seen in the patient's right renal pelvis on fluoroscopy. The correct distal position was confirmed with direct visualization of the coil in the bladder. There was good drainage of urine from the right ureteral stent after placement. The patient's bladder was emptied and cystoscope removed. The patient's pressure stable condition to postanesthesia care unit.  Plan:  The patient will be scheduled for repeat cystoscopy, right  ureteroscopy, and laser  lithotripsy in a few weeks after passive dilation of her ureter from the right ureteral stent. In the future, she will need to address her left nonobstructing renal calculus. She also had gross hematuria with a negative hematuria workup outside of the stones. We will need to repeat her urinalysis in one year's time.  Baruch Gouty, M.D.

## 2015-05-02 ENCOUNTER — Telehealth: Payer: Self-pay

## 2015-05-02 NOTE — Telephone Encounter (Signed)
-----   Message from Nickie Retort, MD sent at 04/29/2015 11:44 AM EST ----- The patient will need to be scheduled for cystoscopy, right retrograde pyelogram, right ureteroscopy, laser lithotripsy, right ureteral stent exchange after Thanksgiving. I was not able to get to her stone today. Thanks.

## 2015-05-02 NOTE — Anesthesia Postprocedure Evaluation (Signed)
Anesthesia Post Note  Patient: Latoya Lutz  Procedure(s) Performed: Procedure(s) (LRB): CYSTOSCOPY WITH STENT PLACEMENT (Right)  Patient location during evaluation: PACU Anesthesia Type: General Level of consciousness: awake, awake and alert and oriented Pain management: pain level controlled Vital Signs Assessment: post-procedure vital signs reviewed and stable Respiratory status: spontaneous breathing Cardiovascular status: blood pressure returned to baseline Anesthetic complications: no    Last Vitals:  Filed Vitals:   04/29/15 1315 04/29/15 1331  BP: 142/61 146/60  Pulse: 52 67  Temp:    Resp: 14 14    Last Pain:  Filed Vitals:   05/02/15 0819  PainSc: 0-No pain                 Brodie Scovell

## 2015-05-03 NOTE — Telephone Encounter (Signed)
Pt notified of surgery scheduled 05/16/15, pre-admit appt on 05/09/15 @8 :15 and to call Friday prior to surgery for arrival time to [facility]. Pt advised to be npo after mn day of surgery. Pt voices understanding.

## 2015-05-09 ENCOUNTER — Other Ambulatory Visit: Payer: 59

## 2015-05-09 ENCOUNTER — Encounter
Admission: RE | Admit: 2015-05-09 | Discharge: 2015-05-09 | Disposition: A | Discharge status: Home / Self Care | Payer: 59 | Source: Ambulatory Visit | Attending: Urology | Admitting: Urology

## 2015-05-09 DIAGNOSIS — N200 Calculus of kidney: Secondary | ICD-10-CM | POA: Insufficient documentation

## 2015-05-09 DIAGNOSIS — Z01812 Encounter for preprocedural laboratory examination: Secondary | ICD-10-CM | POA: Diagnosis present

## 2015-05-09 NOTE — Pre-Procedure Instructions (Signed)
Patient called regarding need for lab work.  Patient states will go to Dr Carlynn Purl office for blood work.

## 2015-05-09 NOTE — Patient Instructions (Signed)
  Your procedure is scheduled on: Monday Dec. 5, 2016. Report to Same Day Surgery. To find out your arrival time please call [phone] between 1PM - 3PM on Friday Dec.2, 2016.  Remember: Instructions that are not followed completely may result in serious medical risk, up to and including death, or upon the discretion of your surgeon and anesthesiologist your surgery may need to be rescheduled.    _x___ 1. Do not eat food or drink liquids after midnight. No gum chewing or hard candies.     ____ 2. No Alcohol for 24 hours before or after surgery.   ____ 3. Bring all medications with you on the day of surgery if instructed.    __x__ 4. Notify your doctor if there is any change in your medical condition     (cold, fever, infections).     Do not wear jewelry, make-up, hairpins, clips or nail polish.  Do not wear lotions, powders, or perfumes. You may wear deodorant.  Do not shave 48 hours prior to surgery. Men may shave face and neck.  Do not bring valuables to the hospital.    [facility] is not responsible for any belongings or valuables.               Contacts, dentures or bridgework may not be worn into surgery.  Leave your suitcase in the car. After surgery it may be brought to your room.  For patients admitted to the hospital, discharge time is determined by your treatment team.   Patients discharged the day of surgery will not be allowed to drive home.    Please read over the following fact sheets that you were given:   [facility] Preparing for Surgery  __x__ Take these medicines the morning of surgery with A SIP OF WATER:    1. pantoprazole (PROTONIX)  2. ondansetron (ZOFRAN ODT  ____ Fleet Enema (as directed)   ____ Use CHG Soap as directed  ____ Use inhalers on the day of surgery  ____ Stop metformin 2 days prior to surgery    ____ Take 1/2 of usual insulin dose the night before surgery and none on the morning of surgery.   ____ Stop Coumadin/Plavix/aspirin on  does not apply. Tylenol or Norco ok to take for pain.  __x_ Stop Anti-inflammatories Mobic now.   __x_ Stop supplements until after surgery.    ____ Bring C-Pap to the hospital.

## 2015-05-10 ENCOUNTER — Other Ambulatory Visit: Payer: 59

## 2015-05-10 DIAGNOSIS — N200 Calculus of kidney: Secondary | ICD-10-CM

## 2015-05-11 LAB — CBC WITH DIFFERENTIAL/PLATELET
BASOS: 0 %
Basophils Absolute: 0 10*3/uL (ref 0.0–0.2)
EOS (ABSOLUTE): 0.2 10*3/uL (ref 0.0–0.4)
Eos: 2 %
Hematocrit: 36.3 % (ref 34.0–46.6)
Hemoglobin: 11.9 g/dL (ref 11.1–15.9)
IMMATURE GRANS (ABS): 0 10*3/uL (ref 0.0–0.1)
IMMATURE GRANULOCYTES: 0 %
LYMPHS: 29 %
Lymphocytes Absolute: 2.5 10*3/uL (ref 0.7–3.1)
MCH: 27.9 pg (ref 26.6–33.0)
MCHC: 32.8 g/dL (ref 31.5–35.7)
MCV: 85 fL (ref 79–97)
MONOS ABS: 0.5 10*3/uL (ref 0.1–0.9)
Monocytes: 6 %
NEUTROS PCT: 63 %
Neutrophils Absolute: 5.3 10*3/uL (ref 1.4–7.0)
PLATELETS: 253 10*3/uL (ref 150–379)
RBC: 4.27 x10E6/uL (ref 3.77–5.28)
RDW: 13.7 % (ref 12.3–15.4)
WBC: 8.5 10*3/uL (ref 3.4–10.8)

## 2015-05-11 LAB — BASIC METABOLIC PANEL
BUN/Creatinine Ratio: 15 (ref 9–23)
BUN: 12 mg/dL (ref 6–24)
CO2: 23 mmol/L (ref 18–29)
Calcium: 9 mg/dL (ref 8.7–10.2)
Chloride: 104 mmol/L (ref 97–106)
Creatinine, Ser: 0.79 mg/dL (ref 0.57–1.00)
GFR calc Af Amer: 105 mL/min/{1.73_m2} (ref >59)
GFR calc non Af Amer: 91 mL/min/{1.73_m2} (ref >59)
GLUCOSE: 119 mg/dL — AB (ref 65–99)
POTASSIUM: 4 mmol/L (ref 3.5–5.2)
SODIUM: 141 mmol/L (ref 136–144)

## 2015-05-12 LAB — CULTURE, URINE COMPREHENSIVE

## 2015-05-13 ENCOUNTER — Telehealth: Payer: Self-pay

## 2015-05-13 ENCOUNTER — Other Ambulatory Visit: Payer: Self-pay | Admitting: Urology

## 2015-05-13 ENCOUNTER — Other Ambulatory Visit: Payer: Self-pay | Admitting: Radiology

## 2015-05-13 DIAGNOSIS — N3000 Acute cystitis without hematuria: Secondary | ICD-10-CM

## 2015-05-13 MED ORDER — SULFAMETHOXAZOLE-TRIMETHOPRIM 800-160 MG PO TABS: 1.0000 | ORAL_TABLET | Freq: Two times a day (BID) | ORAL | Status: DC

## 2015-05-13 NOTE — Telephone Encounter (Signed)
Notified pt of positive urine culture & script for Bactrim DS bid x 7 days sent to [place]. Pt is to start taking medication today. Pt voices understanding.

## 2015-05-13 NOTE — Telephone Encounter (Signed)
I received this pt +ucx. I did not see any orders for tx plan. Please advise.

## 2015-05-16 ENCOUNTER — Telehealth: Payer: Self-pay

## 2015-05-16 ENCOUNTER — Encounter
Admission: RE | Disposition: A | Discharge status: Home / Self Care | Payer: Self-pay | Source: Ambulatory Visit | Attending: Urology

## 2015-05-16 ENCOUNTER — Encounter: Payer: Self-pay | Admitting: *Deleted

## 2015-05-16 ENCOUNTER — Ambulatory Visit: Payer: 59 | Admitting: Anesthesiology

## 2015-05-16 ENCOUNTER — Ambulatory Visit
Admission: RE | Admit: 2015-05-16 | Discharge: 2015-05-16 | Disposition: A | Discharge status: Home / Self Care | Payer: 59 | Source: Ambulatory Visit | Attending: Urology | Admitting: Urology

## 2015-05-16 DIAGNOSIS — Z79899 Other long term (current) drug therapy: Secondary | ICD-10-CM | POA: Diagnosis not present

## 2015-05-16 DIAGNOSIS — N132 Hydronephrosis with renal and ureteral calculous obstruction: Secondary | ICD-10-CM | POA: Diagnosis present

## 2015-05-16 DIAGNOSIS — R310 Gross hematuria: Secondary | ICD-10-CM | POA: Diagnosis not present

## 2015-05-16 DIAGNOSIS — Z809 Family history of malignant neoplasm, unspecified: Secondary | ICD-10-CM | POA: Diagnosis not present

## 2015-05-16 DIAGNOSIS — K219 Gastro-esophageal reflux disease without esophagitis: Secondary | ICD-10-CM | POA: Insufficient documentation

## 2015-05-16 DIAGNOSIS — Z6841 Body Mass Index (BMI) 40.0 and over, adult: Secondary | ICD-10-CM | POA: Insufficient documentation

## 2015-05-16 DIAGNOSIS — Z90710 Acquired absence of both cervix and uterus: Secondary | ICD-10-CM | POA: Insufficient documentation

## 2015-05-16 DIAGNOSIS — Z9049 Acquired absence of other specified parts of digestive tract: Secondary | ICD-10-CM | POA: Diagnosis not present

## 2015-05-16 DIAGNOSIS — Z87891 Personal history of nicotine dependence: Secondary | ICD-10-CM | POA: Diagnosis not present

## 2015-05-16 DIAGNOSIS — N201 Calculus of ureter: Secondary | ICD-10-CM

## 2015-05-16 DIAGNOSIS — Z87442 Personal history of urinary calculi: Secondary | ICD-10-CM | POA: Insufficient documentation

## 2015-05-16 DIAGNOSIS — Z833 Family history of diabetes mellitus: Secondary | ICD-10-CM | POA: Insufficient documentation

## 2015-05-16 DIAGNOSIS — R339 Retention of urine, unspecified: Secondary | ICD-10-CM | POA: Diagnosis not present

## 2015-05-16 DIAGNOSIS — Z85828 Personal history of other malignant neoplasm of skin: Secondary | ICD-10-CM | POA: Insufficient documentation

## 2015-05-16 HISTORY — PX: URETEROSCOPY WITH HOLMIUM LASER LITHOTRIPSY: SHX6645

## 2015-05-16 HISTORY — PX: CYSTOSCOPY W/ URETERAL STENT PLACEMENT: SHX1429

## 2015-05-16 HISTORY — PX: CYSTOSCOPY W/ RETROGRADES: SHX1426

## 2015-05-16 SURGERY — URETEROSCOPY, WITH LITHOTRIPSY USING HOLMIUM LASER
Anesthesia: General | Laterality: Right | Wound class: Clean Contaminated

## 2015-05-16 MED ORDER — PROMETHAZINE HCL 25 MG/ML IJ SOLN
6.2500 mg | INTRAMUSCULAR | Status: DC | PRN
  Administered 2015-05-16: 12.5 mg via INTRAVENOUS

## 2015-05-16 MED ORDER — SODIUM CHLORIDE 0.9 % IJ SOLN
INTRAMUSCULAR | Status: AC
  Filled 2015-05-16: qty 10

## 2015-05-16 MED ORDER — LACTATED RINGERS IV SOLN
INTRAVENOUS | Status: DC
  Administered 2015-05-16: 10:00:00 via INTRAVENOUS

## 2015-05-16 MED ORDER — DEXAMETHASONE SODIUM PHOSPHATE 4 MG/ML IJ SOLN
INTRAMUSCULAR | Status: DC | PRN
  Administered 2015-05-16: 5 mg via INTRAVENOUS

## 2015-05-16 MED ORDER — FENTANYL CITRATE (PF) 100 MCG/2ML IJ SOLN
INTRAMUSCULAR | Status: DC | PRN
  Administered 2015-05-16: 50 ug via INTRAVENOUS
  Administered 2015-05-16: 150 ug via INTRAVENOUS

## 2015-05-16 MED ORDER — NEOSTIGMINE METHYLSULFATE 10 MG/10ML IV SOLN
INTRAVENOUS | Status: DC | PRN
  Administered 2015-05-16: 4 mg via INTRAVENOUS

## 2015-05-16 MED ORDER — GENTAMICIN SULFATE 40 MG/ML IJ SOLN: 120.0000 mg | Freq: Once | INTRAVENOUS | Status: DC

## 2015-05-16 MED ORDER — ALBUTEROL SULFATE HFA 108 (90 BASE) MCG/ACT IN AERS
INHALATION_SPRAY | RESPIRATORY_TRACT | Status: DC | PRN
Start: 2015-05-16 — End: 2015-05-16
  Administered 2015-05-16: 4 via RESPIRATORY_TRACT

## 2015-05-16 MED ORDER — GLYCOPYRROLATE 0.2 MG/ML IJ SOLN
INTRAMUSCULAR | Status: DC | PRN
  Administered 2015-05-16: 7 mg via INTRAVENOUS

## 2015-05-16 MED ORDER — PROMETHAZINE HCL 25 MG/ML IJ SOLN
INTRAMUSCULAR | Status: AC
  Administered 2015-05-16: 12.5 mg via INTRAVENOUS
  Filled 2015-05-16: qty 1

## 2015-05-16 MED ORDER — LIDOCAINE HCL (CARDIAC) 20 MG/ML IV SOLN
INTRAVENOUS | Status: DC | PRN
  Administered 2015-05-16: 100 mg via INTRAVENOUS

## 2015-05-16 MED ORDER — IOTHALAMATE MEGLUMINE 43 % IV SOLN
INTRAVENOUS | Status: DC | PRN
  Administered 2015-05-16: 15 mL

## 2015-05-16 MED ORDER — GENTAMICIN SULFATE 40 MG/ML IJ SOLN
Freq: Once | INTRAMUSCULAR | Status: AC
  Administered 2015-05-16: 50 mL via INTRAVENOUS
  Filled 2015-05-16: qty 50

## 2015-05-16 MED ORDER — MIDAZOLAM HCL 2 MG/2ML IJ SOLN
INTRAMUSCULAR | Status: DC | PRN
  Administered 2015-05-16: 2 mg via INTRAVENOUS

## 2015-05-16 MED ORDER — CEFAZOLIN SODIUM-DEXTROSE 2-3 GM-% IV SOLR
INTRAVENOUS | Status: AC
  Administered 2015-05-16: 2 g via INTRAVENOUS
  Filled 2015-05-16: qty 50

## 2015-05-16 MED ORDER — ONDANSETRON HCL 4 MG/2ML IJ SOLN
INTRAMUSCULAR | Status: DC | PRN
  Administered 2015-05-16: 4 mg via INTRAVENOUS

## 2015-05-16 MED ORDER — PROPOFOL 10 MG/ML IV BOLUS
INTRAVENOUS | Status: DC | PRN
  Administered 2015-05-16: 150 mg via INTRAVENOUS

## 2015-05-16 MED ORDER — CEFAZOLIN SODIUM-DEXTROSE 2-3 GM-% IV SOLR
2.0000 g | Freq: Once | INTRAVENOUS | Status: AC
  Administered 2015-05-16: 2 g via INTRAVENOUS

## 2015-05-16 MED ORDER — ROCURONIUM BROMIDE 100 MG/10ML IV SOLN
INTRAVENOUS | Status: DC | PRN
  Administered 2015-05-16: 40 mg via INTRAVENOUS

## 2015-05-16 MED ORDER — FENTANYL CITRATE (PF) 100 MCG/2ML IJ SOLN: 25.0000 ug | INTRAMUSCULAR | Status: DC | PRN

## 2015-05-16 SURGICAL SUPPLY — 33 items
BACTOSHIELD CHG 4% 4OZ (MISCELLANEOUS) ×1
BASKET ZERO TIP 1.9FR (BASKET) ×4 IMPLANT
CATH URETL 5X70 OPEN END (CATHETERS) ×2 IMPLANT
CNTNR SPEC 2.5X3XGRAD LEK (MISCELLANEOUS) ×1
CONRAY 43 FOR UROLOGY 50M (MISCELLANEOUS) ×2 IMPLANT
CONT SPEC 4OZ STER OR WHT (MISCELLANEOUS) ×1
CONTAINER SPEC 2.5X3XGRAD LEK (MISCELLANEOUS) ×1 IMPLANT
FEE TECHNICIAN ONLY PER HOUR (MISCELLANEOUS) IMPLANT
GLOVE BIO SURGEON STRL SZ7 (GLOVE) ×4 IMPLANT
GLOVE BIO SURGEON STRL SZ7.5 (GLOVE) ×2 IMPLANT
GOWN STRL REUS W/ TWL LRG LVL3 (GOWN DISPOSABLE) ×1 IMPLANT
GOWN STRL REUS W/ TWL LRG LVL4 (GOWN DISPOSABLE) ×1 IMPLANT
GOWN STRL REUS W/TWL LRG LVL3 (GOWN DISPOSABLE) ×1
GOWN STRL REUS W/TWL LRG LVL4 (GOWN DISPOSABLE) ×1
GOWN STRL REUS W/TWL XL LVL3 (GOWN DISPOSABLE) ×2 IMPLANT
GUIDEWIRE SUPER STIFF (WIRE) IMPLANT
KIT RM TURNOVER CYSTO AR (KITS) ×2 IMPLANT
LASER HOLMIUM 3 IN 1 DAY (MISCELLANEOUS) ×2 IMPLANT
LASER HOLMIUM FIBER SU 272UM (MISCELLANEOUS) IMPLANT
LASER HOLMIUM SU 200UM (MISCELLANEOUS) ×2 IMPLANT
PACK CYSTO AR (MISCELLANEOUS) ×2 IMPLANT
SCRUB CHG 4% DYNA-HEX 4OZ (MISCELLANEOUS) ×1 IMPLANT
SENSORWIRE 0.038 NOT ANGLED (WIRE) ×2
SET CYSTO W/LG BORE CLAMP LF (SET/KITS/TRAYS/PACK) ×2 IMPLANT
SHEATH URETERAL 13/15X36 1L (SHEATH) IMPLANT
SOL .9 NS 3000ML IRR  AL (IV SOLUTION) ×1
SOL .9 NS 3000ML IRR UROMATIC (IV SOLUTION) ×1 IMPLANT
STENT URET 6FRX24 CONTOUR (STENTS) ×2 IMPLANT
STENT URET 6FRX26 CONTOUR (STENTS) IMPLANT
SURGILUBE 2OZ TUBE FLIPTOP (MISCELLANEOUS) ×2 IMPLANT
SYRINGE IRR TOOMEY STRL 70CC (SYRINGE) IMPLANT
WATER STERILE IRR 1000ML POUR (IV SOLUTION) ×2 IMPLANT
WIRE SENSOR 0.038 NOT ANGLED (WIRE) ×1 IMPLANT

## 2015-05-16 NOTE — Anesthesia Preprocedure Evaluation (Signed)
Anesthesia Evaluation  Patient identified by MRN, date of birth, ID band Patient awake    Reviewed: Allergy & Precautions, NPO status , Patient's Chart, lab work & pertinent test results  History of Anesthesia Complications (+) PONV and history of anesthetic complications  Airway Mallampati: III  TM Distance: >3 FB Neck ROM: Full    Dental no notable dental hx. (+) Teeth Intact   Pulmonary neg shortness of breath, neg sleep apnea, neg COPD, neg recent URI, former smoker,    Pulmonary exam normal breath sounds clear to auscultation       Cardiovascular negative cardio ROS Normal cardiovascular exam+ Valvular Problems/Murmurs      Neuro/Psych negative neurological ROS  negative psych ROS   GI/Hepatic Neg liver ROS, GERD  Medicated and Controlled,  Endo/Other  neg diabetesMorbid obesity  Renal/GU Renal disease (kidney stones)Renal stones  negative genitourinary   Musculoskeletal  (+) Arthritis , Osteoarthritis,    Abdominal Normal abdominal exam  (+)   Peds negative pediatric ROS (+)  Hematology negative hematology ROS (+)   Anesthesia Other Findings Morbid obesity  Reproductive/Obstetrics                             Anesthesia Physical  Anesthesia Plan  ASA: III  Anesthesia Plan: General   Post-op Pain Management:    Induction: Intravenous, Rapid sequence and Cricoid pressure planned  Airway Management Planned: Oral ETT  Additional Equipment:   Intra-op Plan:   Post-operative Plan: Extubation in OR  Informed Consent: I have reviewed the patients History and Physical, chart, labs and discussed the procedure including the risks, benefits and alternatives for the proposed anesthesia with the patient or authorized representative who has indicated his/her understanding and acceptance.   Dental advisory given  Plan Discussed with: CRNA and Surgeon  Anesthesia Plan Comments:          Anesthesia Quick Evaluation

## 2015-05-16 NOTE — Anesthesia Procedure Notes (Signed)
Procedure Name: Intubation Date/Time: 05/16/2015 10:11 AM Performed by: Rosaria Ferries, Wreatha Sturgeon Pre-anesthesia Checklist: Patient identified, Emergency Drugs available, Suction available and Patient being monitored Patient Re-evaluated:Patient Re-evaluated prior to inductionOxygen Delivery Method: Circle system utilized Preoxygenation: Pre-oxygenation with 100% oxygen Intubation Type: IV induction Laryngoscope Size: Mac and 3 Grade View: Grade I Tube type: Oral Tube size: 7.0 mm Number of attempts: 1 Placement Confirmation: ETT inserted through vocal cords under direct vision,  positive ETCO2 and breath sounds checked- equal and bilateral Secured at: 21 cm Tube secured with: Tape Dental Injury: Teeth and Oropharynx as per pre-operative assessment

## 2015-05-16 NOTE — Op Note (Signed)
Date of procedure: 05/16/2015  Preoperative diagnosis:  1. Right ureteral stone 2. Nonobstructing left renal stone   Postoperative diagnosis:  1. Right ureteral stone 2. Nonobstructing left renal stone   Procedure: 1. Cystoscopy 2. Right ureteroscopy 3. Laser lithotripsy 4. Stone basketing 5. Right retrograde pyelogram with interpretation 6. Right ureteral stent exchange (6 [place] by 24 cm)  Surgeon: Baruch Gouty, MD  Anesthesia: General  Complications: None  Intraoperative findings: Right ureteral stone was visualized and removed in its entirety after laser lithotripter. A right retrograde pyelogram at the end of the procedure showed no residual stones and correct placement of the right ureteral stent.  EBL: None  Specimens: Right ureteral stone  Drains: 6 French by 24 cm right double-J ureteral stent   Disposition: Stable to the postanesthesia care unit  Indication for procedure: The patient is a 44 y.o. female with a right ureteral stone and a nonobstructing left renal stone who presented a right ureteral stent placed presents for definitive management .  After reviewing the management options for treatment, the patient elected to proceed with the above surgical procedure(s). We have discussed the potential benefits and risks of the procedure, side effects of the proposed treatment, the likelihood of the patient achieving the goals of the procedure, and any potential problems that might occur during the procedure or recuperation. Informed consent has been obtained.  Description of procedure: The patient was met in the preoperative area. All risks, benefits, and indications of the procedure were described in great detail. The patient consented to the procedure. Preoperative antibiotics were given. The patient was taken to the operative theater. General anesthesia was induced per the anesthesia service. The patient was then placed in the dorsal lithotomy position prepped and  draped in the usual sterile fashion. A preoperative timeout was called.  A 21 French cystoscope with a 30 lens was inserted to the patient's bladder per urethra atraumatically. The right ureter stent was visualized and grasped flex the graspers. It was brought to level the urethral meatus. A sensor wire was exchanged to its level of the right renal pelvis under fluoroscopy. The stent was removed. Semirigid ureteroscope was inserted into the patient's bladder per urethra. The right ureter orifice was seen and intubated the   ureteroscope. There is was navigated to the mid ureter with this ureteral calculus was seen. It was broken into small pieces with laser lithotripsy. The pieces removed in entirety and is positive the bladder. Pain ureteroscopy after this showed no residual fragments. A retrograde pyelogram was then obtained showing no further filling defects in the renal pelvis. Ureteroscope withdrawn. Cystoscope was assembled over the sensor wire and certain of the bladder. A 6 French by 24 cm double-J ureteral stent was placed over the sensor wire and the sensor wire removed. The stent was confirmed to be in the correct place with a curl seen in the patient's right renal pelvis on fluoroscopy and a curl seen in the patient's urinary bladder under position. The stone firings were then irrigated out and sent to pathology.  Plan: The patient will follow-up in approximately 1 week for a right ureter stent removal. She'll need ultrasound after that time to ensure no indolent hydronephrosis. We will also need to address her left nonobstructing renal calculus.  Baruch Gouty, M.D.

## 2015-05-16 NOTE — Anesthesia Postprocedure Evaluation (Signed)
Anesthesia Post Note  Patient: Latoya Lutz  Procedure(s) Performed: Procedure(s) (LRB): URETEROSCOPY WITH HOLMIUM LASER LITHOTRIPSY (Right) CYSTOSCOPY WITH STENT REPLACEMENT (Right) CYSTOSCOPY WITH RETROGRADE PYELOGRAM (Right)  Patient location during evaluation: PACU Anesthesia Type: General Level of consciousness: awake and alert Pain management: pain level controlled Vital Signs Assessment: post-procedure vital signs reviewed and stable Respiratory status: spontaneous breathing, nonlabored ventilation, respiratory function stable and patient connected to nasal cannula oxygen Cardiovascular status: blood pressure returned to baseline and stable Postop Assessment: no signs of nausea or vomiting Anesthetic complications: no    Last Vitals:  Filed Vitals:   05/16/15 1214 05/16/15 1356  BP: 127/59 129/75  Pulse: 57 53  Temp: 36.3 C   Resp: 14 14    Last Pain:  Filed Vitals:   05/16/15 1357  PainSc: 0-No pain                 Martha Clan

## 2015-05-16 NOTE — Interval H&P Note (Signed)
History and Physical Interval Note:  05/16/2015 9:50 AM  Latoya Lutz  has presented today for surgery, with the diagnosis of renal stone,hydronephrosis  The various methods of treatment have been discussed with the patient and family. After consideration of risks, benefits and other options for treatment, the patient has consented to  Procedure(s): URETEROSCOPY WITH HOLMIUM LASER LITHOTRIPSY (Right) CYSTOSCOPY WITH STENT REPLACEMENT (Right) CYSTOSCOPY WITH RETROGRADE PYELOGRAM (Right) as a surgical intervention .  The patient's history has been reviewed, patient examined, no change in status, stable for surgery.  I have reviewed the patient's chart and labs.  Questions were answered to the patient's satisfaction.    RRR unlabored respirations  Horald Pollen [place]

## 2015-05-16 NOTE — H&P (View-Only) (Signed)
2:23 PM   [place] 02-Feb-1971 [id number]  Referring provider: Jackolyn Confer, MD [id number] [place], [place] [address]  No chief complaint on file.   HPI: Patient is a 44 year old white female who presents today for her CT Urogram report.  Background history Patient is a 44 year old white female with a history of bilateral nephrolithiasis found on a CT Urogram in 11/2012 who presented for right side pain that radiated to the right flank pain.  Patient stated the pain began last week. It initially started as her not feeling well with nausea and no appetite. The next day she started experience right-sided groin pain that radiated to the right flank. It lasted for a few hours and then abated. 4 days later the pain returned and it was quite intense. It was located in the same area with the same radiation pattern. She vomited that day due to the pain. The pain then abated. Then 3 days ago, she started experience nausea again. The next day the pain returned in its intensity. She had Rapaflo on hand and took one of those medications. The pain lasted a few hours and then abated.  She had been experiencing gross hematuria all week.  She describes it as a pink tinge to her urine with flaky stuff. She has not had fevers but admits to chills, nausea and vomiting.    Today, she is feeling well and does not report any flank pain.  She has not had any passage of fragments.  She is not having fevers, chills, nausea or vomiting.  Her urine culture from 04/25/2015 was negative.    Her CT Urogram demonstrated moderate to severe right hydronephrosis secondary to a stone/stones in the lower right ureter.  I have reviewed the films with the patient.    PMH: Past Medical History  Diagnosis Date  . Heart murmur   . Chicken pox   . Kidney stones   . Endometriosis     Dr. Rushie Lutz at [facility], removed   . Cervical spine disease   . Chronic urethral narrowing     undergoing  stretching  . H/O cystoscopy     normal  . Urinary retention   . Acid reflux   . Arthritis   . Skin cancer   . Complication of anesthesia   . PONV (postoperative nausea and vomiting)     Surgical History: Past Surgical History  Procedure Laterality Date  . Cholecystectomy    . Bladder surgery  07/2003  . Cesarean section  1998  . Abdominal hysterectomy      [facility] complete  . Tonsillectomy and adenoidectomy    . Knee surgery  2005  . Foot surgery  2003  . Carpal tunnel release Right     Home Medications:    Medication List       This list is accurate as of: 04/27/15 11:59 PM.  Always use your most recent med list.               ALLEGRA PO  Take 1 tablet by mouth every evening.     CALCIUM 600 PO  Take by mouth.     fluticasone 50 MCG/ACT nasal spray  Commonly known as:  FLONASE  Place 1 spray into the nose daily.     L-ARGININE PO  Take by mouth.     meloxicam 7.5 MG tablet  Commonly known as:  MOBIC  Take 7.5 mg by mouth as needed.  MENOPAUSE FORMULA Tabs  Take by mouth.     ondansetron 4 MG disintegrating tablet  Commonly known as:  ZOFRAN ODT  Take 1 tablet (4 mg total) by mouth every 8 (eight) hours as needed for nausea or vomiting.     pantoprazole 40 MG tablet  Commonly known as:  PROTONIX  TAKE 1 TABLET BY MOUTH DAILY     pseudoephedrine 30 MG tablet  Commonly known as:  SUDAFED  Take 30 mg by mouth as needed.     silodosin 8 MG Caps capsule  Commonly known as:  RAPAFLO  Take 8 mg by mouth daily with breakfast.     Vitamin D (Cholecalciferol) 1000 UNITS Tabs  Take by mouth.     vitamin E 400 UNIT capsule  Take 400 Units by mouth daily.        Allergies: No Known Allergies  Family History: Family History  Problem Relation Age of Onset  . Cancer Maternal Grandmother   . Diabetes Paternal Grandmother     Social History:  reports that she quit smoking about 7 years ago. Her smoking use included Cigarettes. She has a 8.5  pack-year smoking history. She does not have any smokeless tobacco history on file. She reports that she does not drink alcohol or use illicit drugs.  ROS: UROLOGY Frequent Urination?: No Hard to postpone urination?: No Burning/pain with urination?: No Get up at night to urinate?: Yes Leakage of urine?: No Urine stream starts and stops?: No Trouble starting stream?: No Do you have to strain to urinate?: Yes Blood in urine?: Yes Urinary tract infection?: Yes Sexually transmitted disease?: No Injury to kidneys or bladder?: Yes Painful intercourse?: No Weak stream?: Yes Currently pregnant?: No Vaginal bleeding?: No Last menstrual period?: n  Gastrointestinal Nausea?: Yes Vomiting?: Yes Indigestion/heartburn?: No Diarrhea?: Yes Constipation?: No  Constitutional Fever: No Night sweats?: No Weight loss?: No Fatigue?: No  Skin Skin rash/lesions?: No Itching?: No  Eyes Blurred vision?: No Double vision?: No  Ears/Nose/Throat Sore throat?: No Sinus problems?: No  Hematologic/Lymphatic Swollen glands?: No Easy bruising?: No  Cardiovascular Leg swelling?: No Chest pain?: No  Respiratory Cough?: No Shortness of breath?: No  Endocrine Excessive thirst?: Yes  Musculoskeletal Back pain?: Yes Joint pain?: No  Neurological Headaches?: No Dizziness?: Yes  Psychologic Depression?: No Anxiety?: No  Physical Exam: There were no vitals taken for this visit.  Constitutional: Well nourished. Alert and oriented, No acute distress. HEENT: [place] AT, moist mucus membranes. Trachea midline, no masses. Cardiovascular: No clubbing, cyanosis, or edema. Respiratory: Normal respiratory effort, no increased work of breathing. GI: Abdomen is soft, non tender, non distended, no abdominal masses. Liver and spleen not palpable.  No hernias appreciated.  Stool sample for occult testing is not indicated.   GU: No CVA tenderness.  No bladder fullness or masses.   Skin: No  rashes, bruises or suspicious lesions. Lymph: No cervical or inguinal adenopathy. Neurologic: Grossly intact, no focal deficits, moving all 4 extremities. Psychiatric: Normal mood and affect.  Laboratory Data: Lab Results  Component Value Date   WBC 7.7 05/25/2014   HGB 12.1 05/25/2014   HCT 37.1 05/25/2014   MCV 85.9 05/25/2014   PLT 273.0 05/25/2014    Lab Results  Component Value Date   CREATININE 1.31* 04/25/2015    Lab Results  Component Value Date   HGBA1C 5.9 05/25/2014    Urinalysis Results for orders placed or performed in visit on 04/25/15  CULTURE, URINE COMPREHENSIVE  Result Value Ref Range  Urine Culture, Comprehensive Final report    Result 1 Comment   Microscopic Examination  Result Value Ref Range   WBC, UA None seen 0 -  5 /hpf   RBC, UA >30 (H) 0 -  2 /hpf   Epithelial Cells (non renal) 0-10 0 - 10 /hpf   Mucus, UA Present (A) Not Estab.   Bacteria, UA Few (A) None seen/Few  Urinalysis, Complete  Result Value Ref Range   Specific Gravity, UA >1.030 (H) 1.005 - 1.030   pH, UA 5.5 5.0 - 7.5   Color, UA Yellow Yellow   Appearance Ur Clear Clear   Leukocytes, UA Negative Negative   Protein, UA 1+ (A) Negative/Trace   Glucose, UA Negative Negative   Ketones, UA Negative Negative   RBC, UA 3+ (A) Negative   Bilirubin, UA Negative Negative   Urobilinogen, Ur 0.2 0.2 - 1.0 mg/dL   Nitrite, UA Negative Negative   Microscopic Examination See below:   BUN+Creat  Result Value Ref Range   BUN 13 6 - 24 mg/dL   Creatinine, Ser 1.31 (H) 0.57 - 1.00 mg/dL   GFR calc non Af Amer 49 (L) >59 mL/min/1.73   GFR calc Af Amer 57 (L) >59 mL/min/1.73   BUN/Creatinine Ratio 10 9 - 23     Assessment & Plan:    1. Right ureteral stone:   Patient was found to have a right ureteral stone with moderate to severe hydronephrosis on CT.  She will undergo right URS/LL/right ureteral stent placement for definitive management.    I explained to the patient how the  procedure is performed and the risks involved.    I informed patient that she will have a stent placed during the procedure and will remain in place after the procedure for a short time.  It will be removed in the office with a cystoscope, unless a string in left in place.  I informed that patient that about 50% of patients who undergo ureteroscopy and have a stent will have "stent pain," and this is by far the most common risk/complaint following ureteroscopy. A stent is a soft plastic tube (about half the size of IV tubing) that allows the kidney to drain to the bladder regardless of edema or obstruction. Not only can the stent "rub" on the inside of the bladder, causing a feeling of needing to urinate/overactive bladder, but also the stent allows urine to pass up from the bladder to the kidney during urination - causing symptoms from a warm, tingling sensation to intense pain in the affected flank.   They may be residual stones within the kidney or ureter may be present up to 40% of the time following ureteroscopy, depending on the original stone size and location. These stone fragments will be seen and addressed on follow-up imaging.  Injury to the ureter is the most common intra-operative complication during ureteroscopy. The reported risk of perforation ranges greatly, depending on whether it is defined as a complete perforation (0.1-0.7% - think of this as a hole through the entire ureter), a partial perforation (1.6% - a hole nearly through the entire ureter), or mucosal tear/scrape (5% - these are similar to a sore on the inside of the mouth). Almost 100% of these will heal with prolonged stenting (anywhere between 2 - 4 weeks). Should a large perforation occur, your urologist may chose to stop the procedure and return on another day when the ureter has had time to heal.  2. Gross hematuria:  Patient has completed her CT Urogram and was found to have a right ureteral stone causing obstruction.  She  will be undergoing a right URS/LL/ right ureteral stent placement.  Return for right URS/LL/right ureteral.  Zara Council, [facility] [address] [place], [place] [address] [phone]

## 2015-05-16 NOTE — Discharge Instructions (Signed)
General Anesthesia, Adult, Care After Refer to this sheet in the next few weeks. These instructions provide you with information on caring for yourself after your procedure. Your health care provider may also give you more specific instructions. Your treatment has been planned according to current medical practices, but problems sometimes occur. Call your health care provider if you have any problems or questions after your procedure. WHAT TO EXPECT AFTER THE PROCEDURE After the procedure, it is typical to experience:  Sleepiness.  Nausea and vomiting. HOME CARE INSTRUCTIONS  For the first 24 hours after general anesthesia:  Have a responsible person with you.  Do not drive a car. If you are alone, do not take public transportation.  Do not drink alcohol.  Do not take medicine that has not been prescribed by your health care provider.  Do not sign important papers or make important decisions.  You may resume a normal diet and activities as directed by your health care provider.  Change bandages (dressings) as directed.  If you have questions or problems that seem related to general anesthesia, call the hospital and ask for the anesthetist or anesthesiologist on call. SEEK MEDICAL CARE IF:  You have nausea and vomiting that continue the day after anesthesia.  You develop a rash. SEEK IMMEDIATE MEDICAL CARE IF:   You have difficulty breathing.  You have chest pain.  You have any allergic problems.   This information is not intended to replace advice given to you by your health care provider. Make sure you discuss any questions you have with your health care provider.   Document Released: 09/03/2000 Document Revised: 06/18/2014 Document Reviewed: 09/26/2011 Elsevier Interactive Patient Education 2016 [employer]  Ureteral Stent Implantation, Care After Refer to this sheet in the next few weeks. These instructions provide you with information on caring for yourself after  your procedure. Your health care provider may also give you more specific instructions. Your treatment has been planned according to current medical practices, but problems sometimes occur. Call your health care provider if you have any problems or questions after your procedure. WHAT TO EXPECT AFTER THE PROCEDURE You should be back to normal activity within 48 hours after the procedure. Nausea and vomiting may occur and are commonly the result of anesthesia. It is common to experience sharp pain in the back or lower abdomen and penis with voiding. This is caused by movement of the ends of the stent with the act of urinating.It usually goes away within minutes after you have stopped urinating. HOME CARE INSTRUCTIONS Make sure to drink plenty of fluids. You may have small amounts of bleeding, causing your urine to be red. This is normal. Certain movements may trigger pain or a feeling that you need to urinate. You may be given medicines to prevent infection or bladder spasms. Be sure to take all medicines as directed. Only take over-the-counter or prescription medicines for pain, discomfort, or fever as directed by your health care provider. Do not take aspirin, as this can make bleeding worse. Your stent will be left in until the blockage is resolved. This may take 2 weeks or longer, depending on the reason for stent implantation. You may have an X-ray exam to make sure your ureter is open and that the stent has not moved out of position (migrated). The stent can be removed by your health care provider in the office. Medicines may be given for comfort while the stent is being removed. Be sure to keep all follow-up appointments  so your health care provider can check that you are healing properly. SEEK MEDICAL CARE IF:  You experience increasing pain.  Your pain medicine is not working. SEEK IMMEDIATE MEDICAL CARE IF:  Your urine is dark red or has blood clots.  You are leaking urine  (incontinent).  You have a fever, chills, feeling sick to your stomach (nausea), or vomiting.  Your pain is not relieved by pain medicine.  The end of the stent comes out of the urethra.  You are unable to urinate.   This information is not intended to replace advice given to you by your health care provider. Make sure you discuss any questions you have with your health care provider.   Document Released: 01/28/2013 Document Revised: 06/02/2013 Document Reviewed: 12/10/2014 Elsevier Interactive Patient Education [employer].

## 2015-05-16 NOTE — Telephone Encounter (Signed)
-----   Message from Nickie Retort, MD sent at 05/16/2015 10:58 AM EST ----- The patient needs to see follow up in 3-7 days for ureteral stent removal.  She will need an ultrasound one month after the stent is removed. Thanks.

## 2015-05-16 NOTE — Transfer of Care (Signed)
Immediate Anesthesia Transfer of Care Note  Patient: JADAISHA BREEZE  Procedure(s) Performed: Procedure(s): URETEROSCOPY WITH HOLMIUM LASER LITHOTRIPSY (Right) CYSTOSCOPY WITH STENT REPLACEMENT (Right) CYSTOSCOPY WITH RETROGRADE PYELOGRAM (Right)  Patient Location: PACU  Anesthesia Type:General  Level of Consciousness: sedated and patient cooperative  Airway & Oxygen Therapy: Patient Spontanous Breathing and Patient connected to nasal cannula oxygen  Post-op Assessment: Report given to RN and Post -op Vital signs reviewed and stable  Post vital signs: Reviewed and stable  Last Vitals:  Filed Vitals:   05/16/15 0831 05/16/15 1059  BP: 121/72 160/91  Pulse: 77 73  Temp: 36.7 C 37.7 C  Resp: 14 15    Complications: No apparent anesthesia complications

## 2015-05-17 ENCOUNTER — Other Ambulatory Visit: Payer: Self-pay | Admitting: Radiology

## 2015-05-17 ENCOUNTER — Encounter: Payer: Self-pay | Admitting: Urology

## 2015-05-17 DIAGNOSIS — N200 Calculus of kidney: Secondary | ICD-10-CM

## 2015-05-19 LAB — STONE ANALYSIS
CA OXALATE, DIHYDRATE: 15 %
CA OXALATE, MONOHYDR.: 75 %
Ca phos cry stone ql IR: 10 %
STONE WEIGHT KSTONE: 159 mg

## 2015-05-20 ENCOUNTER — Ambulatory Visit: Payer: 59

## 2015-05-20 ENCOUNTER — Ambulatory Visit (INDEPENDENT_AMBULATORY_CARE_PROVIDER_SITE_OTHER): Payer: 59 | Admitting: Urology

## 2015-05-20 ENCOUNTER — Encounter: Payer: Self-pay | Admitting: Urology

## 2015-05-20 VITALS — BP 137/82 | HR 69 | Ht 66.0 in | Wt 281.7 lb

## 2015-05-20 DIAGNOSIS — N200 Calculus of kidney: Secondary | ICD-10-CM | POA: Diagnosis not present

## 2015-05-20 LAB — URINALYSIS, COMPLETE
BILIRUBIN UA: NEGATIVE
GLUCOSE, UA: NEGATIVE
KETONES UA: NEGATIVE
NITRITE UA: NEGATIVE
Urobilinogen, Ur: 0.2 mg/dL (ref 0.2–1.0)
pH, UA: 5 (ref 5.0–7.5)

## 2015-05-20 LAB — MICROSCOPIC EXAMINATION: RBC, UA: >30 /hpf — ABNORMAL HIGH (ref 0–?)

## 2015-05-20 MED ORDER — CIPROFLOXACIN HCL 500 MG PO TABS
500.0000 mg | ORAL_TABLET | Freq: Once | ORAL | Status: AC
  Administered 2015-05-20: 500 mg via ORAL

## 2015-05-20 MED ORDER — LIDOCAINE HCL 2 % EX GEL
1.0000 "application " | Freq: Once | CUTANEOUS | Status: AC
  Administered 2015-05-20: 1 via URETHRAL

## 2015-05-20 NOTE — Progress Notes (Signed)
05/20/2015 2:35 PM   [place] December 25, 1970 [id number]  Referring provider: Jackolyn Confer, MD [id number] [place], [place] [address]  Chief Complaint  Patient presents with  . Cysto Stent Removal      Cystoscopy                                                HPI: The patient is a 44 y.o. Female in a year following up for right ureteral stent removal after undergoing cystoscopy, right ureteroscopy, laser lithotripsy, stone basketing, and right ureteral stent placement.  Stone analysis: Ca Oxalate,Dihydrate % 15VC   Ca Oxalate,Monohydr. % 75VC   CA PHOS CRY STONE QL IR % 10VC             PMH: Past Medical History  Diagnosis Date  . Heart murmur   . Chicken pox   . Kidney stones   . Endometriosis     Dr. Rushie Chestnut at [facility], removed   . Cervical spine disease   . Chronic urethral narrowing     undergoing stretching  . H/O cystoscopy     normal  . Urinary retention   . Acid reflux   . Arthritis   . Skin cancer   . Complication of anesthesia   . PONV (postoperative nausea and vomiting)   . Esophageal reflux 05/25/2014  . Obesity, morbid, BMI 40.0-49.9 ([place]) 04/10/2012    Surgical History: Past Surgical History  Procedure Laterality Date  . Cholecystectomy    . Bladder surgery  07/2003  . Cesarean section  1998  . Abdominal hysterectomy      [facility] complete  . Tonsillectomy and adenoidectomy    . Knee surgery  2005  . Foot surgery  2003  . Carpal tunnel release Right   . Cystoscopy with stent placement Right 04/29/2015    Procedure: CYSTOSCOPY WITH STENT PLACEMENT;  Surgeon: Nickie Retort, MD;  Location: [facility] ORS;  Service: Urology;  Laterality: Right;  . Ureteroscopy with holmium laser lithotripsy Right 05/16/2015    Procedure: URETEROSCOPY WITH HOLMIUM LASER LITHOTRIPSY;  Surgeon: Nickie Retort, MD;  Location: [facility];  Service: Urology;  Laterality: Right;  . Cystoscopy w/ ureteral stent placement Right 05/16/2015   Procedure: CYSTOSCOPY WITH STENT REPLACEMENT;  Surgeon: Nickie Retort, MD;  Location: [facility];  Service: Urology;  Laterality: Right;  . Cystoscopy w/ retrogrades Right 05/16/2015    Procedure: CYSTOSCOPY WITH RETROGRADE PYELOGRAM;  Surgeon: Nickie Retort, MD;  Location: [facility];  Service: Urology;  Laterality: Right;    Home Medications:    Medication List       This list is accurate as of: 05/20/15  2:35 PM.  Always use your most recent med list.               ALLEGRA PO  Take 1 tablet by mouth every evening.     CALCIUM 600 PO  Take 1 tablet by mouth daily.     docusate sodium 100 MG capsule  Commonly known as:  COLACE  Take 1 capsule (100 mg total) by mouth 2 (two) times daily.     fluticasone 50 MCG/ACT nasal spray  Commonly known as:  FLONASE  Place 1 spray into the nose daily. As needed.     HYDROcodone-acetaminophen 5-325 MG tablet  Commonly known as:  NORCO  Take 1 tablet by mouth every 6 (six) hours as needed for moderate pain.     L-ARGININE PO  Take 1 tablet by mouth. In pm.     meloxicam 7.5 MG tablet  Commonly known as:  MOBIC  Take 7.5 mg by mouth as needed.     MENOPAUSE FORMULA Tabs  Take by mouth.     ondansetron 4 MG disintegrating tablet  Commonly known as:  ZOFRAN ODT  Take 1 tablet (4 mg total) by mouth every 8 (eight) hours as needed for nausea or vomiting.     pantoprazole 40 MG tablet  Commonly known as:  PROTONIX  TAKE 1 TABLET BY MOUTH DAILY     pseudoephedrine 30 MG tablet  Commonly known as:  SUDAFED  Take 30 mg by mouth as needed.     scopolamine 1 MG/3DAYS  Commonly known as:  TRANSDERM-SCOP (1.5 MG)  Place 1 patch (1.5 mg total) onto the skin every 3 (three) days.     silodosin 8 MG Caps capsule  Commonly known as:  RAPAFLO  Take 8 mg by mouth daily with breakfast.     sulfamethoxazole-trimethoprim 800-160 MG tablet  Commonly known as:  BACTRIM DS,SEPTRA DS  Take 1 tablet by mouth every 12 (twelve) hours.       Vitamin D (Cholecalciferol) 1000 UNITS Tabs  Take by mouth.     vitamin E 400 UNIT capsule  Take 400 Units by mouth daily.        Allergies: No Known Allergies  Family History: Family History  Problem Relation Age of Onset  . Cancer Maternal Grandmother   . Diabetes Paternal Grandmother     Social History:  reports that she quit smoking about 7 years ago. Her smoking use included Cigarettes. She has a 8.5 pack-year smoking history. She does not have any smokeless tobacco history on file. She reports that she does not drink alcohol or use illicit drugs.  ROS: UROLOGY Frequent Urination?: Yes Hard to postpone urination?: Yes Burning/pain with urination?: Yes Get up at night to urinate?: Yes Leakage of urine?: Yes Urine stream starts and stops?: Yes Trouble starting stream?: Yes Do you have to strain to urinate?: Yes Blood in urine?: Yes Urinary tract infection?: Yes Sexually transmitted disease?: No Injury to kidneys or bladder?: No Painful intercourse?: No Weak stream?: No Currently pregnant?: No Vaginal bleeding?: No Last menstrual period?: n  Gastrointestinal Nausea?: Yes Vomiting?: Yes Indigestion/heartburn?: No Diarrhea?: No Constipation?: No  Constitutional Fever: No Night sweats?: No Weight loss?: No Fatigue?: Yes  Skin Skin rash/lesions?: No  Eyes Blurred vision?: No Double vision?: No  Ears/Nose/Throat Sore throat?: No Sinus problems?: No  Hematologic/Lymphatic Swollen glands?: No Easy bruising?: No  Cardiovascular Leg swelling?: No Chest pain?: No  Respiratory Cough?: No  Endocrine Excessive thirst?: No  Musculoskeletal Back pain?: No Joint pain?: No  Neurological Headaches?: No Dizziness?: No  Psychologic Depression?: No Anxiety?: No  Physical Exam: BP 137/82 mmHg  Pulse 69  Ht 5\' 6"  (1.676 m)  Wt 281 lb 11.2 oz (127.778 kg)  BMI 45.49 kg/m2  Constitutional:  Alert and oriented, No acute distress. HEENT: [place]  AT, moist mucus membranes.  Trachea midline, no masses. Cardiovascular: No clubbing, cyanosis, or edema. Respiratory: Normal respiratory effort, no increased work of breathing. GI: Abdomen is soft, nontender, nondistended, no abdominal masses GU: No CVA tenderness.  Skin: No rashes, bruises or suspicious lesions. Lymph: No cervical or inguinal adenopathy. Neurologic: Grossly intact, no focal deficits, moving all 4  extremities. Psychiatric: Normal mood and affect.  Laboratory Data: Lab Results  Component Value Date   WBC 8.5 05/10/2015   HGB 12.1 05/25/2014   HCT 36.3 05/10/2015   MCV 85.9 05/25/2014   PLT 273.0 05/25/2014    Lab Results  Component Value Date   CREATININE 0.79 05/10/2015    No results found for: PSA  No results found for: TESTOSTERONE  Lab Results  Component Value Date   HGBA1C 5.9 05/25/2014    Urinalysis    Component Value Date/Time   COLORURINE ORANGE 10/25/2012 2238   APPEARANCEUR HAZY 10/25/2012 2238   LABSPEC 1.027 10/25/2012 2238   PHURINE see comment 10/25/2012 2238   GLUCOSEU Negative 04/25/2015 0910   GLUCOSEU see comment 10/25/2012 2238   HGBUR see comment 10/25/2012 2238   BILIRUBINUR Negative 04/25/2015 0910   BILIRUBINUR see comment 10/25/2012 2238   BILIRUBINUR n 04/10/2012 1431   KETONESUR see comment 10/25/2012 2238   PROTEINUR see comment 10/25/2012 2238   PROTEINUR n 04/10/2012 1431   UROBILINOGEN 0.2 04/10/2012 1431   NITRITE Negative 04/25/2015 0910   NITRITE SEE COMMENT 10/25/2012 2238   NITRITE n 04/10/2012 1431   LEUKOCYTESUR Negative 04/25/2015 0910   LEUKOCYTESUR see comment 10/25/2012 2238   LEUKOCYTESUR Negative 04/10/2012 1431     Cystoscopy Procedure Note  Patient identification was confirmed, informed consent was obtained, and patient was prepped using Betadine solution.  Lidocaine jelly was administered per urethral meatus.    Preoperative abx where received prior to procedure.    Procedure: - Flexible  cystoscope introduced, without any difficulty.   - Right ureteral stent grasped with flexible graspers and removed intact  Post-Procedure: - Patient tolerated the procedure well    Assessment & Plan:    I discussed with the patient ESWL to clear her remaining left renal stone. She is aware of the risks, benefits, and indications. She understands the risks include, but are not limited to, hematoma, ureteral obstruction requiring second procedure, and postoperative pain. All questions were answered. She is agreeable to proceeding.  1. Right ureteral stone s/p URS -Renal ultrasound in one month (will get at same time as post ESWL KUB)  2.  Left nonobstructing renal stone Stone is visible on scout image from recent CT. -Left ESWL  3.  Recurrent nephrolithiasis -We'll consider 24-hour urine collection at time of post ESWL follow-up   Nickie Retort, [place] [address] [place],  [address] [phone]

## 2015-05-23 ENCOUNTER — Encounter
Admission: RE | Admit: 2015-05-23 | Discharge: 2015-05-23 | Disposition: A | Discharge status: Home / Self Care | Payer: 59 | Source: Ambulatory Visit | Attending: Urology | Admitting: Urology

## 2015-05-23 DIAGNOSIS — Z01810 Encounter for preprocedural cardiovascular examination: Secondary | ICD-10-CM | POA: Insufficient documentation

## 2015-05-23 DIAGNOSIS — R011 Cardiac murmur, unspecified: Secondary | ICD-10-CM | POA: Insufficient documentation

## 2015-05-25 ENCOUNTER — Telehealth: Payer: Self-pay | Admitting: Urology

## 2015-05-25 ENCOUNTER — Other Ambulatory Visit: Payer: 59

## 2015-05-25 DIAGNOSIS — B379 Candidiasis, unspecified: Secondary | ICD-10-CM

## 2015-05-25 NOTE — Telephone Encounter (Signed)
Pt called stating that she has finished her antibiotic Monday and feels like she is starting to have a yeast infection.  Needs something called in to [facility].  Please call patient [phone].

## 2015-05-26 ENCOUNTER — Ambulatory Visit: Admission: RE | Admit: 2015-05-26 | Payer: 59 | Source: Ambulatory Visit | Admitting: Urology

## 2015-05-26 ENCOUNTER — Encounter: Admission: RE | Payer: Self-pay | Source: Ambulatory Visit

## 2015-05-26 SURGERY — LITHOTRIPSY, ESWL
Anesthesia: Moderate Sedation | Laterality: Left

## 2015-05-27 MED ORDER — FLUCONAZOLE 150 MG PO TABS: 150.0000 mg | ORAL_TABLET | Freq: Once | ORAL | Status: DC

## 2015-05-27 NOTE — Telephone Encounter (Signed)
Pt called back again today, has checked pharmacy twice and nothing has been called in for her yet, please call pharmacy and call patient to let her know something has been called in

## 2015-05-27 NOTE — Telephone Encounter (Signed)
Per Dr. Erlene Quan pt was given 1 diflucan.

## 2015-06-01 ENCOUNTER — Encounter: Payer: 59 | Admitting: Internal Medicine

## 2015-06-01 ENCOUNTER — Ambulatory Visit (INDEPENDENT_AMBULATORY_CARE_PROVIDER_SITE_OTHER): Payer: 59 | Admitting: Internal Medicine

## 2015-06-01 ENCOUNTER — Encounter: Payer: Self-pay | Admitting: Internal Medicine

## 2015-06-01 ENCOUNTER — Ambulatory Visit
Admission: RE | Admit: 2015-06-01 | Discharge: 2015-06-01 | Disposition: A | Discharge status: Home / Self Care | Payer: 59 | Source: Ambulatory Visit | Attending: Internal Medicine | Admitting: Internal Medicine

## 2015-06-01 VITALS — BP 142/82 | HR 68 | Temp 98.5°F | Ht 64.5 in | Wt 286.0 lb

## 2015-06-01 DIAGNOSIS — Z1231 Encounter for screening mammogram for malignant neoplasm of breast: Secondary | ICD-10-CM | POA: Insufficient documentation

## 2015-06-01 DIAGNOSIS — Z0000 Encounter for general adult medical examination without abnormal findings: Secondary | ICD-10-CM | POA: Diagnosis not present

## 2015-06-01 LAB — COMPREHENSIVE METABOLIC PANEL
ALBUMIN: 3.9 g/dL (ref 3.5–5.2)
ALK PHOS: 94 U/L (ref 39–117)
ALT: 12 U/L (ref 0–35)
AST: 12 U/L (ref 0–37)
BUN: 13 mg/dL (ref 6–23)
CALCIUM: 9.1 mg/dL (ref 8.4–10.5)
CHLORIDE: 106 meq/L (ref 96–112)
CO2: 29 mEq/L (ref 19–32)
CREATININE: 0.8 mg/dL (ref 0.40–1.20)
GFR: 82.19 mL/min (ref >60.00)
Glucose, Bld: 97 mg/dL (ref 70–99)
POTASSIUM: 3.9 meq/L (ref 3.5–5.1)
SODIUM: 142 meq/L (ref 135–145)
TOTAL PROTEIN: 6.3 g/dL (ref 6.0–8.3)
Total Bilirubin: 0.3 mg/dL (ref 0.2–1.2)

## 2015-06-01 LAB — LIPID PANEL
CHOLESTEROL: 198 mg/dL (ref 0–200)
HDL: 36.8 mg/dL — ABNORMAL LOW (ref >39.00)
LDL CALC: 122 mg/dL — AB (ref 0–99)
NonHDL: 161.42
TRIGLYCERIDES: 197 mg/dL — AB (ref 0.0–149.0)
Total CHOL/HDL Ratio: 5
VLDL: 39.4 mg/dL (ref 0.0–40.0)

## 2015-06-01 LAB — MICROALBUMIN / CREATININE URINE RATIO
Creatinine,U: 93.7 mg/dL
MICROALB UR: 1.2 mg/dL (ref 0.0–1.9)
MICROALB/CREAT RATIO: 1.3 mg/g (ref 0.0–30.0)

## 2015-06-01 LAB — CBC WITH DIFFERENTIAL/PLATELET
BASOS PCT: 0.6 % (ref 0.0–3.0)
Basophils Absolute: 0 10*3/uL (ref 0.0–0.1)
EOS PCT: 1.6 % (ref 0.0–5.0)
Eosinophils Absolute: 0.1 10*3/uL (ref 0.0–0.7)
HEMATOCRIT: 35.5 % — AB (ref 36.0–46.0)
HEMOGLOBIN: 11.7 g/dL — AB (ref 12.0–15.0)
LYMPHS PCT: 33.4 % (ref 12.0–46.0)
Lymphs Abs: 2.2 10*3/uL (ref 0.7–4.0)
MCHC: 33 g/dL (ref 30.0–36.0)
MCV: 83 fl (ref 78.0–100.0)
MONOS PCT: 5.1 % (ref 3.0–12.0)
Monocytes Absolute: 0.3 10*3/uL (ref 0.1–1.0)
Neutro Abs: 3.9 10*3/uL (ref 1.4–7.7)
Neutrophils Relative %: 59.3 % (ref 43.0–77.0)
Platelets: 240 10*3/uL (ref 150.0–400.0)
RBC: 4.27 Mil/uL (ref 3.87–5.11)
RDW: 13.8 % (ref 11.5–15.5)
WBC: 6.6 10*3/uL (ref 4.0–10.5)

## 2015-06-01 LAB — TSH: TSH: 2.61 u[IU]/mL (ref 0.35–4.50)

## 2015-06-01 LAB — HEMOGLOBIN A1C: HEMOGLOBIN A1C: 5.8 % (ref 4.6–6.5)

## 2015-06-01 LAB — VITAMIN D 25 HYDROXY (VIT D DEFICIENCY, FRACTURES): VITD: 17.42 ng/mL — AB (ref 30.00–100.00)

## 2015-06-01 MED ORDER — NYSTATIN 100000 UNIT/GM EX POWD: CUTANEOUS | Status: DC

## 2015-06-01 MED ORDER — LIRAGLUTIDE -WEIGHT MANAGEMENT 18 MG/3ML ~~LOC~~ SOPN: 0.6000 mg | PEN_INJECTOR | Freq: Every day | SUBCUTANEOUS | Status: DC

## 2015-06-01 MED ORDER — INSULIN PEN NEEDLE 33G X 5 MM MISC: 1.0000 "application " | Freq: Every day | Status: DC

## 2015-06-01 NOTE — Assessment & Plan Note (Signed)
General medical exam normal today including breast exam. Mammogram today. PAP is UTD, last 2014 HPV neg, plan repeat in 2017. Immunizations UTD. Labs today. Encouraged healthy diet and exercise.

## 2015-06-01 NOTE — Progress Notes (Signed)
Subjective:    Patient ID: Latoya Lutz, female    DOB: [birth year], 44 y.o.   MRN: [id number]  HPI  44YO female presents for physical exam.  Scheduled to have lithotripsy tomorrow with Dr. Erlene Quan. Had stent placed as well. Had lithotripsy x2. Dr. Shirlean Mylar did previous procedures.  Scheduled for mammogram today.  Obesity - Trying to limit calorie intake. Eating more salad. Limiting fast foods. Notes significant carb intake, with increased intake of foods such as pretzels. No regular exercise.    Wt Readings from Last 3 Encounters:  06/01/15 286 lb (129.729 kg)  05/20/15 281 lb 11.2 oz (127.778 kg)  05/16/15 273 lb (123.832 kg)   BP Readings from Last 3 Encounters:  06/01/15 142/82  05/20/15 137/82  05/16/15 129/75    Past Medical History  Diagnosis Date  . Heart murmur   . Chicken pox   . Kidney stones   . Endometriosis     Dr. Rushie Chestnut at [facility], removed   . Cervical spine disease   . Chronic urethral narrowing     undergoing stretching  . H/O cystoscopy     normal  . Urinary retention   . Acid reflux   . Arthritis   . Skin cancer   . Complication of anesthesia   . PONV (postoperative nausea and vomiting)   . Esophageal reflux 05/25/2014  . Obesity, morbid, BMI 40.0-49.9 ([place]) 04/10/2012   Family History  Problem Relation Age of Onset  . Cancer Maternal Grandmother   . Diabetes Paternal Grandmother    Past Surgical History  Procedure Laterality Date  . Cholecystectomy    . Bladder surgery  07/2003  . Cesarean section  1998  . Abdominal hysterectomy      [facility] complete  . Tonsillectomy and adenoidectomy    . Knee surgery  2005  . Foot surgery  2003  . Carpal tunnel release Right   . Cystoscopy with stent placement Right 04/29/2015    Procedure: CYSTOSCOPY WITH STENT PLACEMENT;  Surgeon: Nickie Retort, MD;  Location: [facility];  Service: Urology;  Laterality: Right;  . Ureteroscopy with holmium laser lithotripsy Right 05/16/2015    Procedure:  URETEROSCOPY WITH HOLMIUM LASER LITHOTRIPSY;  Surgeon: Nickie Retort, MD;  Location: [facility];  Service: Urology;  Laterality: Right;  . Cystoscopy w/ ureteral stent placement Right 05/16/2015    Procedure: CYSTOSCOPY WITH STENT REPLACEMENT;  Surgeon: Nickie Retort, MD;  Location: [facility];  Service: Urology;  Laterality: Right;  . Cystoscopy w/ retrogrades Right 05/16/2015    Procedure: CYSTOSCOPY WITH RETROGRADE PYELOGRAM;  Surgeon: Nickie Retort, MD;  Location: [facility];  Service: Urology;  Laterality: Right;   Social History   Social History  . Marital Status: Married    Spouse Name: N/A  . Number of Children: N/A  . Years of Education: N/A   Social History Main Topics  . Smoking status: Former Smoker -- 0.50 packs/day for 17 years    Types: Cigarettes    Quit date: 04/27/2008  . Smokeless tobacco: None     Comment: quit 7 years   . Alcohol Use: No  . Drug Use: No  . Sexual Activity: Not Asked   Other Topics Concern  . None   Social History Narrative   Lives in [place] with son 15YO and fiance      Work - [employer]    Review of Systems  Constitutional: Negative for fever, chills, appetite change, fatigue and unexpected weight change.  Eyes:  Negative for visual disturbance.  Respiratory: Negative for shortness of breath.   Cardiovascular: Negative for chest pain and leg swelling.  Gastrointestinal: Negative for nausea, vomiting, abdominal pain, diarrhea and constipation.  Musculoskeletal: Negative for myalgias and arthralgias.  Skin: Negative for color change and rash.  Hematological: Negative for adenopathy. Does not bruise/bleed easily.  Psychiatric/Behavioral: Negative for sleep disturbance and dysphoric mood. The patient is not nervous/anxious.        Objective:    BP 142/82 mmHg  Pulse 68  Temp(Src) 98.5 F (36.9 C) (Oral)  Ht 5' 4.5" (1.638 m)  Wt 286 lb (129.729 kg)  BMI 48.35 kg/m2  SpO2 97% Physical Exam  Constitutional: She is oriented to  person, place, and time. She appears well-developed and well-nourished. No distress.  HENT:  Head: Normocephalic and atraumatic.  Right Ear: External ear normal.  Left Ear: External ear normal.  Nose: Nose normal.  Mouth/Throat: Oropharynx is clear and moist. No oropharyngeal exudate.  Eyes: Conjunctivae are normal. Pupils are equal, round, and reactive to light. Right eye exhibits no discharge. Left eye exhibits no discharge. No scleral icterus.  Neck: Normal range of motion. Neck supple. No tracheal deviation present. No thyromegaly present.  Cardiovascular: Normal rate, regular rhythm, normal heart sounds and intact distal pulses.  Exam reveals no gallop and no friction rub.   No murmur heard. Pulmonary/Chest: Effort normal and breath sounds normal. No accessory muscle usage. No tachypnea. No respiratory distress. She has no decreased breath sounds. She has no wheezes. She has no rales. She exhibits no tenderness. Right breast exhibits no inverted nipple, no mass, no nipple discharge, no skin change and no tenderness. Left breast exhibits no inverted nipple, no mass, no nipple discharge, no skin change and no tenderness. Breasts are symmetrical.  Abdominal: Soft. Bowel sounds are normal. She exhibits no distension and no mass. There is no tenderness. There is no rebound and no guarding.  obese  Musculoskeletal: Normal range of motion. She exhibits no edema or tenderness.  Lymphadenopathy:    She has no cervical adenopathy.  Neurological: She is alert and oriented to person, place, and time. No cranial nerve deficit. She exhibits normal muscle tone. Coordination normal.  Skin: Skin is warm and dry. No rash noted. She is not diaphoretic. No erythema. No pallor.  Psychiatric: She has a normal mood and affect. Her behavior is normal. Judgment and thought content normal.          Assessment & Plan:   Problem List Items Addressed This Visit      Unprioritized   Obesity, morbid, BMI  40.0-49.9 ([place])    Wt Readings from Last 3 Encounters:  06/01/15 286 lb (129.729 kg)  05/20/15 281 lb 11.2 oz (127.778 kg)  05/16/15 273 lb (123.832 kg)   Encouraged her to keep a diary of her food intake. Encourage her to limit carbohydrate intake. Discussed some medications to help with appetite. Will start Saxenda 0.6mg  daily. Discussed potential risks of this medication. Follow up in 4 weeks and sooner by email.      Relevant Medications   Liraglutide -Weight Management (SAXENDA) 18 MG/3ML SOPN   Insulin Pen Needle 33G X 5 MM MISC   Routine general medical examination at a health care facility - Primary    General medical exam normal today including breast exam. Mammogram today. PAP is UTD, last 2014 HPV neg, plan repeat in 2017. Immunizations UTD. Labs today. Encouraged healthy diet and exercise.      Relevant Orders  TSH   Hemoglobin A1c   CBC with Differential/Platelet   Comprehensive metabolic panel   Lipid panel   VITAMIN D 25 Hydroxy (Vit-D Deficiency, Fractures)   Microalbumin / creatinine urine ratio       Return in about 4 weeks (around 06/29/2015) for Recheck.

## 2015-06-01 NOTE — Progress Notes (Signed)
Pre visit review using our clinic review tool, if applicable. No additional management support is needed unless otherwise documented below in the visit note. 

## 2015-06-01 NOTE — Assessment & Plan Note (Signed)
Wt Readings from Last 3 Encounters:  06/01/15 286 lb (129.729 kg)  05/20/15 281 lb 11.2 oz (127.778 kg)  05/16/15 273 lb (123.832 kg)   Encouraged her to keep a diary of her food intake. Encourage her to limit carbohydrate intake. Discussed some medications to help with appetite. Will start Saxenda 0.6mg  daily. Discussed potential risks of this medication. Follow up in 4 weeks and sooner by email.

## 2015-06-01 NOTE — Patient Instructions (Addendum)
Consider using the MyFitness Pal App to log everything you eat.  Go to www.Saxenda.com to get coupon. Start Saxenda 0.'6mg'$  daily.   Health Maintenance, Female Adopting a healthy lifestyle and getting preventive care can go a long way to promote health and wellness. Talk with your health care provider about what schedule of regular examinations is right for you. This is a good chance for you to check in with your provider about disease prevention and staying healthy. In between checkups, there are plenty of things you can do on your own. Experts have done a lot of research about which lifestyle changes and preventive measures are most likely to keep you healthy. Ask your health care provider for more information. WEIGHT AND DIET  Eat a healthy diet  Be sure to include plenty of vegetables, fruits, low-fat dairy products, and lean protein.  Do not eat a lot of foods high in solid fats, added sugars, or salt.  Get regular exercise. This is one of the most important things you can do for your health.  Most adults should exercise for at least 150 minutes each week. The exercise should increase your heart rate and make you sweat (moderate-intensity exercise).  Most adults should also do strengthening exercises at least twice a week. This is in addition to the moderate-intensity exercise.  Maintain a healthy weight  Body mass index (BMI) is a measurement that can be used to identify possible weight problems. It estimates body fat based on height and weight. Your health care provider can help determine your BMI and help you achieve or maintain a healthy weight.  For females 70 years of age and older:   A BMI below 18.5 is considered underweight.  A BMI of 18.5 to 24.9 is normal.  A BMI of 25 to 29.9 is considered overweight.  A BMI of 30 and above is considered obese.  Watch levels of cholesterol and blood lipids  You should start having your blood tested for lipids and cholesterol at 45  years of age, then have this test every 5 years.  You may need to have your cholesterol levels checked more often if:  Your lipid or cholesterol levels are high.  You are older than 45 years of age.  You are at high risk for heart disease.  CANCER SCREENING   Lung Cancer  Lung cancer screening is recommended for adults 55-49 years old who are at high risk for lung cancer because of a history of smoking.  A yearly low-dose CT scan of the lungs is recommended for people who:  Currently smoke.  Have quit within the past 15 years.  Have at least a 30-pack-year history of smoking. A pack year is smoking an average of one pack of cigarettes a day for 1 year.  Yearly screening should continue until it has been 15 years since you quit.  Yearly screening should stop if you develop a health problem that would prevent you from having lung cancer treatment.  Breast Cancer  Practice breast self-awareness. This means understanding how your breasts normally appear and feel.  It also means doing regular breast self-exams. Let your health care provider know about any changes, no matter how small.  If you are in your 20s or 30s, you should have a clinical breast exam (CBE) by a health care provider every 1-3 years as part of a regular health exam.  If you are 68 or older, have a CBE every year. Also consider having a breast X-ray (mammogram)  every year.  If you have a family history of breast cancer, talk to your health care provider about genetic screening.  If you are at high risk for breast cancer, talk to your health care provider about having an MRI and a mammogram every year.  Breast cancer gene (BRCA) assessment is recommended for women who have family members with BRCA-related cancers. BRCA-related cancers include:  Breast.  Ovarian.  Tubal.  Peritoneal cancers.  Results of the assessment will determine the need for genetic counseling and BRCA1 and BRCA2 testing. Cervical  Cancer Your health care provider may recommend that you be screened regularly for cancer of the pelvic organs (ovaries, uterus, and vagina). This screening involves a pelvic examination, including checking for microscopic changes to the surface of your cervix (Pap test). You may be encouraged to have this screening done every 3 years, beginning at age 71.  For women ages 27-65, health care providers may recommend pelvic exams and Pap testing every 3 years, or they may recommend the Pap and pelvic exam, combined with testing for human papilloma virus (HPV), every 5 years. Some types of HPV increase your risk of cervical cancer. Testing for HPV may also be done on women of any age with unclear Pap test results.  Other health care providers may not recommend any screening for nonpregnant women who are considered low risk for pelvic cancer and who do not have symptoms. Ask your health care provider if a screening pelvic exam is right for you.  If you have had past treatment for cervical cancer or a condition that could lead to cancer, you need Pap tests and screening for cancer for at least 20 years after your treatment. If Pap tests have been discontinued, your risk factors (such as having a new sexual partner) need to be reassessed to determine if screening should resume. Some women have medical problems that increase the chance of getting cervical cancer. In these cases, your health care provider may recommend more frequent screening and Pap tests. Colorectal Cancer  This type of cancer can be detected and often prevented.  Routine colorectal cancer screening usually begins at 45 years of age and continues through 45 years of age.  Your health care provider may recommend screening at an earlier age if you have risk factors for colon cancer.  Your health care provider may also recommend using home test kits to check for hidden blood in the stool.  A small camera at the end of a tube can be used to  examine your colon directly (sigmoidoscopy or colonoscopy). This is done to check for the earliest forms of colorectal cancer.  Routine screening usually begins at age 79.  Direct examination of the colon should be repeated every 5-10 years through 45 years of age. However, you may need to be screened more often if early forms of precancerous polyps or small growths are found. Skin Cancer  Check your skin from head to toe regularly.  Tell your health care provider about any new moles or changes in moles, especially if there is a change in a mole's shape or color.  Also tell your health care provider if you have a mole that is larger than the size of a pencil eraser.  Always use sunscreen. Apply sunscreen liberally and repeatedly throughout the day.  Protect yourself by wearing long sleeves, pants, a wide-brimmed hat, and sunglasses whenever you are outside. HEART DISEASE, DIABETES, AND HIGH BLOOD PRESSURE   High blood pressure causes heart disease and  increases the risk of stroke. High blood pressure is more likely to develop in:  People who have blood pressure in the high end of the normal range (130-139/85-89 mm Hg).  People who are overweight or obese.  People who are African American.  If you are 61-12 years of age, have your blood pressure checked every 3-5 years. If you are 49 years of age or older, have your blood pressure checked every year. You should have your blood pressure measured twice--once when you are at a hospital or clinic, and once when you are not at a hospital or clinic. Record the average of the two measurements. To check your blood pressure when you are not at a hospital or clinic, you can use:  An automated blood pressure machine at a pharmacy.  A home blood pressure monitor.  If you are between 72 years and 80 years old, ask your health care provider if you should take aspirin to prevent strokes.  Have regular diabetes screenings. This involves taking a  blood sample to check your fasting blood sugar level.  If you are at a normal weight and have a low risk for diabetes, have this test once every three years after 45 years of age.  If you are overweight and have a high risk for diabetes, consider being tested at a younger age or more often. PREVENTING INFECTION  Hepatitis B  If you have a higher risk for hepatitis B, you should be screened for this virus. You are considered at high risk for hepatitis B if:  You were born in a country where hepatitis B is common. Ask your health care provider which countries are considered high risk.  Your parents were born in a high-risk country, and you have not been immunized against hepatitis B (hepatitis B vaccine).  You have HIV or AIDS.  You use needles to inject street drugs.  You live with someone who has hepatitis B.  You have had sex with someone who has hepatitis B.  You get hemodialysis treatment.  You take certain medicines for conditions, including cancer, organ transplantation, and autoimmune conditions. Hepatitis C  Blood testing is recommended for:  Everyone born from [birth year] through 1966.  Anyone with known risk factors for hepatitis C. Sexually transmitted infections (STIs)  You should be screened for sexually transmitted infections (STIs) including gonorrhea and chlamydia if:  You are sexually active and are younger than 45 years of age.  You are older than 45 years of age and your health care provider tells you that you are at risk for this type of infection.  Your sexual activity has changed since you were last screened and you are at an increased risk for chlamydia or gonorrhea. Ask your health care provider if you are at risk.  If you do not have HIV, but are at risk, it may be recommended that you take a prescription medicine daily to prevent HIV infection. This is called pre-exposure prophylaxis (PrEP). You are considered at risk if:  You are sexually active and do  not regularly use condoms or know the HIV status of your partner(s).  You take drugs by injection.  You are sexually active with a partner who has HIV. Talk with your health care provider about whether you are at high risk of being infected with HIV. If you choose to begin PrEP, you should first be tested for HIV. You should then be tested every 3 months for as long as you are taking PrEP.  PREGNANCY   If you are premenopausal and you may become pregnant, ask your health care provider about preconception counseling.  If you may become pregnant, take 400 to 800 micrograms (mcg) of folic acid every day.  If you want to prevent pregnancy, talk to your health care provider about birth control (contraception). OSTEOPOROSIS AND MENOPAUSE   Osteoporosis is a disease in which the bones lose minerals and strength with aging. This can result in serious bone fractures. Your risk for osteoporosis can be identified using a bone density scan.  If you are 49 years of age or older, or if you are at risk for osteoporosis and fractures, ask your health care provider if you should be screened.  Ask your health care provider whether you should take a calcium or vitamin D supplement to lower your risk for osteoporosis.  Menopause may have certain physical symptoms and risks.  Hormone replacement therapy may reduce some of these symptoms and risks. Talk to your health care provider about whether hormone replacement therapy is right for you.  HOME CARE INSTRUCTIONS   Schedule regular health, dental, and eye exams.  Stay current with your immunizations.   Do not use any tobacco products including cigarettes, chewing tobacco, or electronic cigarettes.  If you are pregnant, do not drink alcohol.  If you are breastfeeding, limit how much and how often you drink alcohol.  Limit alcohol intake to no more than 1 drink per day for nonpregnant women. One drink equals 12 ounces of beer, 5 ounces of wine, or 1  ounces of hard liquor.  Do not use street drugs.  Do not share needles.  Ask your health care provider for help if you need support or information about quitting drugs.  Tell your health care provider if you often feel depressed.  Tell your health care provider if you have ever been abused or do not feel safe at home.   This information is not intended to replace advice given to you by your health care provider. Make sure you discuss any questions you have with your health care provider.   Document Released: 12/11/2010 Document Revised: 06/18/2014 Document Reviewed: 04/29/2013 Elsevier Interactive Patient Education [employer].

## 2015-06-02 ENCOUNTER — Ambulatory Visit: Payer: 59

## 2015-06-02 ENCOUNTER — Encounter
Admission: RE | Disposition: A | Discharge status: Home / Self Care | Payer: Self-pay | Source: Ambulatory Visit | Attending: Urology

## 2015-06-02 ENCOUNTER — Encounter: Payer: Self-pay | Admitting: *Deleted

## 2015-06-02 ENCOUNTER — Ambulatory Visit
Admission: RE | Admit: 2015-06-02 | Discharge: 2015-06-02 | Disposition: A | Discharge status: Home / Self Care | Payer: 59 | Source: Ambulatory Visit | Attending: Urology | Admitting: Urology

## 2015-06-02 DIAGNOSIS — N200 Calculus of kidney: Secondary | ICD-10-CM | POA: Diagnosis present

## 2015-06-02 DIAGNOSIS — Z87442 Personal history of urinary calculi: Secondary | ICD-10-CM | POA: Insufficient documentation

## 2015-06-02 DIAGNOSIS — Z87891 Personal history of nicotine dependence: Secondary | ICD-10-CM | POA: Diagnosis not present

## 2015-06-02 DIAGNOSIS — R339 Retention of urine, unspecified: Secondary | ICD-10-CM | POA: Diagnosis not present

## 2015-06-02 DIAGNOSIS — Z85828 Personal history of other malignant neoplasm of skin: Secondary | ICD-10-CM | POA: Diagnosis not present

## 2015-06-02 DIAGNOSIS — K219 Gastro-esophageal reflux disease without esophagitis: Secondary | ICD-10-CM | POA: Diagnosis not present

## 2015-06-02 DIAGNOSIS — Z6841 Body Mass Index (BMI) 40.0 and over, adult: Secondary | ICD-10-CM | POA: Diagnosis not present

## 2015-06-02 HISTORY — PX: EXTRACORPOREAL SHOCK WAVE LITHOTRIPSY: SHX1557

## 2015-06-02 SURGERY — LITHOTRIPSY, ESWL
Anesthesia: Moderate Sedation | Laterality: Left

## 2015-06-02 MED ORDER — CIPROFLOXACIN HCL 500 MG PO TABS
ORAL_TABLET | ORAL | Status: AC
  Filled 2015-06-02: qty 1

## 2015-06-02 MED ORDER — CIPROFLOXACIN HCL 500 MG PO TABS
500.0000 mg | ORAL_TABLET | Freq: Once | ORAL | Status: AC
  Administered 2015-06-02: 500 mg via ORAL

## 2015-06-02 MED ORDER — DIPHENHYDRAMINE HCL 25 MG PO CAPS
ORAL_CAPSULE | ORAL | Status: AC
  Filled 2015-06-02: qty 1

## 2015-06-02 MED ORDER — DIPHENHYDRAMINE HCL 25 MG PO CAPS
25.0000 mg | ORAL_CAPSULE | ORAL | Status: AC
  Administered 2015-06-02: 25 mg via ORAL

## 2015-06-02 MED ORDER — TAMSULOSIN HCL 0.4 MG PO CAPS: 0.4000 mg | ORAL_CAPSULE | Freq: Every day | ORAL | Status: DC

## 2015-06-02 MED ORDER — HYDROCODONE-ACETAMINOPHEN 5-325 MG PO TABS: 1.0000 | ORAL_TABLET | Freq: Four times a day (QID) | ORAL | Status: DC | PRN

## 2015-06-02 MED ORDER — MIDAZOLAM HCL 2 MG/2ML IJ SOLN
INTRAMUSCULAR | Status: AC
  Administered 2015-06-02: 1 mg
  Filled 2015-06-02: qty 2

## 2015-06-02 MED ORDER — DIAZEPAM 5 MG PO TABS
10.0000 mg | ORAL_TABLET | ORAL | Status: AC
  Administered 2015-06-02: 10 mg via ORAL

## 2015-06-02 MED ORDER — DIAZEPAM 5 MG PO TABS
ORAL_TABLET | ORAL | Status: AC
  Filled 2015-06-02: qty 2

## 2015-06-02 MED ORDER — ONDANSETRON HCL 4 MG/2ML IJ SOLN
INTRAMUSCULAR | Status: AC
  Filled 2015-06-02: qty 2

## 2015-06-02 MED ORDER — SODIUM CHLORIDE 0.9 % IV SOLN
INTRAVENOUS | Status: DC
  Administered 2015-06-02: 14:00:00 via INTRAVENOUS

## 2015-06-02 MED ORDER — ONDANSETRON HCL 4 MG/2ML IJ SOLN
4.0000 mg | Freq: Once | INTRAMUSCULAR | Status: AC
  Administered 2015-06-02: 4 mg via INTRAVENOUS

## 2015-06-02 MED ORDER — DEXTROSE-NACL 5-0.45 % IV SOLN: INTRAVENOUS | Status: DC

## 2015-06-02 MED ORDER — MIDAZOLAM HCL 5 MG/ML IJ SOLN: 1.0000 mg | Freq: Once | INTRAMUSCULAR | Status: DC

## 2015-06-02 NOTE — Interval H&P Note (Signed)
History and Physical Interval Note:  06/02/2015 10:47 AM  Latoya Lutz  has presented today for surgery, with the diagnosis of kidney stone  The various methods of treatment have been discussed with the patient and family. After consideration of risks, benefits and other options for treatment, the patient has consented to  Procedure(s): EXTRACORPOREAL SHOCK WAVE LITHOTRIPSY (ESWL) (Left) as a surgical intervention .  The patient's history has been reviewed, patient examined, no change in status, stable for surgery.  I have reviewed the patient's chart and labs.  Questions were answered to the patient's satisfaction.     Hollice Espy

## 2015-06-02 NOTE — OR Nursing (Signed)
complaning of nausea.  No vomiting. Zofran given IV

## 2015-06-02 NOTE — Discharge Instructions (Signed)
See [facility] discharge instructions in chart.  AMBULATORY SURGERY  DISCHARGE INSTRUCTIONS   1) The drugs that you were given will stay in your system until tomorrow so for the next 24 hours you should not:  A) Drive an automobile B) Make any legal decisions C) Drink any alcoholic beverage   2) You may resume regular meals tomorrow.  Today it is better to start with liquids and gradually work up to solid foods.  You may eat anything you prefer, but it is better to start with liquids, then soup and crackers, and gradually work up to solid foods.   3) Please notify your doctor immediately if you have any unusual bleeding, trouble breathing, redness and pain at the surgery site, drainage, fever, or pain not relieved by medication.    4) Additional Instructions:        Please contact your physician with any problems or Same Day Surgery at [phone], Monday through Friday 6 am to 4 pm, or [facility] [place] Main number at [phone].  

## 2015-06-02 NOTE — H&P (View-Only) (Signed)
05/20/2015 2:35 PM   [place] 1971/04/11 [id number]  Referring provider: Jackolyn Confer, MD [id number] [place], [place] [address]  Chief Complaint  Patient presents with  . Cysto Stent Removal      Cystoscopy                                                HPI: The patient is a 44 y.o. Female in a year following up for right ureteral stent removal after undergoing cystoscopy, right ureteroscopy, laser lithotripsy, stone basketing, and right ureteral stent placement.  Stone analysis: Ca Oxalate,Dihydrate % 15VC   Ca Oxalate,Monohydr. % 75VC   CA PHOS CRY STONE QL IR % 10VC             PMH: Past Medical History  Diagnosis Date  . Heart murmur   . Chicken pox   . Kidney stones   . Endometriosis     Dr. Rushie Chestnut at [facility], removed   . Cervical spine disease   . Chronic urethral narrowing     undergoing stretching  . H/O cystoscopy     normal  . Urinary retention   . Acid reflux   . Arthritis   . Skin cancer   . Complication of anesthesia   . PONV (postoperative nausea and vomiting)   . Esophageal reflux 05/25/2014  . Obesity, morbid, BMI 40.0-49.9 ([place]) 04/10/2012    Surgical History: Past Surgical History  Procedure Laterality Date  . Cholecystectomy    . Bladder surgery  07/2003  . Cesarean section  1998  . Abdominal hysterectomy      [facility] complete  . Tonsillectomy and adenoidectomy    . Knee surgery  2005  . Foot surgery  2003  . Carpal tunnel release Right   . Cystoscopy with stent placement Right 04/29/2015    Procedure: CYSTOSCOPY WITH STENT PLACEMENT;  Surgeon: Nickie Retort, MD;  Location: [facility] ORS;  Service: Urology;  Laterality: Right;  . Ureteroscopy with holmium laser lithotripsy Right 05/16/2015    Procedure: URETEROSCOPY WITH HOLMIUM LASER LITHOTRIPSY;  Surgeon: Nickie Retort, MD;  Location: [facility] ORS;  Service: Urology;  Laterality: Right;  . Cystoscopy w/ ureteral stent placement Right 05/16/2015   Procedure: CYSTOSCOPY WITH STENT REPLACEMENT;  Surgeon: Nickie Retort, MD;  Location: [facility] ORS;  Service: Urology;  Laterality: Right;  . Cystoscopy w/ retrogrades Right 05/16/2015    Procedure: CYSTOSCOPY WITH RETROGRADE PYELOGRAM;  Surgeon: Nickie Retort, MD;  Location: [facility] ORS;  Service: Urology;  Laterality: Right;    Home Medications:    Medication List       This list is accurate as of: 05/20/15  2:35 PM.  Always use your most recent med list.               ALLEGRA PO  Take 1 tablet by mouth every evening.     CALCIUM 600 PO  Take 1 tablet by mouth daily.     docusate sodium 100 MG capsule  Commonly known as:  COLACE  Take 1 capsule (100 mg total) by mouth 2 (two) times daily.     fluticasone 50 MCG/ACT nasal spray  Commonly known as:  FLONASE  Place 1 spray into the nose daily. As needed.     HYDROcodone-acetaminophen 5-325 MG tablet  Commonly known as:  NORCO  Take 1 tablet by mouth every 6 (six) hours as needed for moderate pain.     L-ARGININE PO  Take 1 tablet by mouth. In pm.     meloxicam 7.5 MG tablet  Commonly known as:  MOBIC  Take 7.5 mg by mouth as needed.     MENOPAUSE FORMULA Tabs  Take by mouth.     ondansetron 4 MG disintegrating tablet  Commonly known as:  ZOFRAN ODT  Take 1 tablet (4 mg total) by mouth every 8 (eight) hours as needed for nausea or vomiting.     pantoprazole 40 MG tablet  Commonly known as:  PROTONIX  TAKE 1 TABLET BY MOUTH DAILY     pseudoephedrine 30 MG tablet  Commonly known as:  SUDAFED  Take 30 mg by mouth as needed.     scopolamine 1 MG/3DAYS  Commonly known as:  TRANSDERM-SCOP (1.5 MG)  Place 1 patch (1.5 mg total) onto the skin every 3 (three) days.     silodosin 8 MG Caps capsule  Commonly known as:  RAPAFLO  Take 8 mg by mouth daily with breakfast.     sulfamethoxazole-trimethoprim 800-160 MG tablet  Commonly known as:  BACTRIM DS,SEPTRA DS  Take 1 tablet by mouth every 12 (twelve) hours.       Vitamin D (Cholecalciferol) 1000 UNITS Tabs  Take by mouth.     vitamin E 400 UNIT capsule  Take 400 Units by mouth daily.        Allergies: No Known Allergies  Family History: Family History  Problem Relation Age of Onset  . Cancer Maternal Grandmother   . Diabetes Paternal Grandmother     Social History:  reports that she quit smoking about 7 years ago. Her smoking use included Cigarettes. She has a 8.5 pack-year smoking history. She does not have any smokeless tobacco history on file. She reports that she does not drink alcohol or use illicit drugs.  ROS: UROLOGY Frequent Urination?: Yes Hard to postpone urination?: Yes Burning/pain with urination?: Yes Get up at night to urinate?: Yes Leakage of urine?: Yes Urine stream starts and stops?: Yes Trouble starting stream?: Yes Do you have to strain to urinate?: Yes Blood in urine?: Yes Urinary tract infection?: Yes Sexually transmitted disease?: No Injury to kidneys or bladder?: No Painful intercourse?: No Weak stream?: No Currently pregnant?: No Vaginal bleeding?: No Last menstrual period?: n  Gastrointestinal Nausea?: Yes Vomiting?: Yes Indigestion/heartburn?: No Diarrhea?: No Constipation?: No  Constitutional Fever: No Night sweats?: No Weight loss?: No Fatigue?: Yes  Skin Skin rash/lesions?: No  Eyes Blurred vision?: No Double vision?: No  Ears/Nose/Throat Sore throat?: No Sinus problems?: No  Hematologic/Lymphatic Swollen glands?: No Easy bruising?: No  Cardiovascular Leg swelling?: No Chest pain?: No  Respiratory Cough?: No  Endocrine Excessive thirst?: No  Musculoskeletal Back pain?: No Joint pain?: No  Neurological Headaches?: No Dizziness?: No  Psychologic Depression?: No Anxiety?: No  Physical Exam: BP 137/82 mmHg  Pulse 69  Ht 5\' 6"  (1.676 m)  Wt 281 lb 11.2 oz (127.778 kg)  BMI 45.49 kg/m2  Constitutional:  Alert and oriented, No acute distress. HEENT: [place]  AT, moist mucus membranes.  Trachea midline, no masses. Cardiovascular: No clubbing, cyanosis, or edema. Respiratory: Normal respiratory effort, no increased work of breathing. GI: Abdomen is soft, nontender, nondistended, no abdominal masses GU: No CVA tenderness.  Skin: No rashes, bruises or suspicious lesions. Lymph: No cervical or inguinal adenopathy. Neurologic: Grossly intact, no focal deficits, moving all 4  extremities. Psychiatric: Normal mood and affect.  Laboratory Data: Lab Results  Component Value Date   WBC 8.5 05/10/2015   HGB 12.1 05/25/2014   HCT 36.3 05/10/2015   MCV 85.9 05/25/2014   PLT 273.0 05/25/2014    Lab Results  Component Value Date   CREATININE 0.79 05/10/2015    No results found for: PSA  No results found for: TESTOSTERONE  Lab Results  Component Value Date   HGBA1C 5.9 05/25/2014    Urinalysis    Component Value Date/Time   COLORURINE ORANGE 10/25/2012 2238   APPEARANCEUR HAZY 10/25/2012 2238   LABSPEC 1.027 10/25/2012 2238   PHURINE see comment 10/25/2012 2238   GLUCOSEU Negative 04/25/2015 0910   GLUCOSEU see comment 10/25/2012 2238   HGBUR see comment 10/25/2012 2238   BILIRUBINUR Negative 04/25/2015 0910   BILIRUBINUR see comment 10/25/2012 2238   BILIRUBINUR n 04/10/2012 1431   KETONESUR see comment 10/25/2012 2238   PROTEINUR see comment 10/25/2012 2238   PROTEINUR n 04/10/2012 1431   UROBILINOGEN 0.2 04/10/2012 1431   NITRITE Negative 04/25/2015 0910   NITRITE SEE COMMENT 10/25/2012 2238   NITRITE n 04/10/2012 1431   LEUKOCYTESUR Negative 04/25/2015 0910   LEUKOCYTESUR see comment 10/25/2012 2238   LEUKOCYTESUR Negative 04/10/2012 1431     Cystoscopy Procedure Note  Patient identification was confirmed, informed consent was obtained, and patient was prepped using Betadine solution.  Lidocaine jelly was administered per urethral meatus.    Preoperative abx where received prior to procedure.    Procedure: - Flexible  cystoscope introduced, without any difficulty.   - Right ureteral stent grasped with flexible graspers and removed intact  Post-Procedure: - Patient tolerated the procedure well    Assessment & Plan:    I discussed with the patient ESWL to clear her remaining left renal stone. She is aware of the risks, benefits, and indications. She understands the risks include, but are not limited to, hematoma, ureteral obstruction requiring second procedure, and postoperative pain. All questions were answered. She is agreeable to proceeding.  1. Right ureteral stone s/p URS -Renal ultrasound in one month (will get at same time as post ESWL KUB)  2.  Left nonobstructing renal stone Stone is visible on scout image from recent CT. -Left ESWL  3.  Recurrent nephrolithiasis -We'll consider 24-hour urine collection at time of post ESWL follow-up   Nickie Retort, [place] [address], [place], [place] [address] [phone]

## 2015-06-03 ENCOUNTER — Encounter: Payer: Self-pay | Admitting: Urology

## 2015-06-07 ENCOUNTER — Ambulatory Visit: Payer: 59

## 2015-06-10 ENCOUNTER — Ambulatory Visit: Payer: 59 | Admitting: Obstetrics and Gynecology

## 2015-06-16 ENCOUNTER — Telehealth: Payer: Self-pay

## 2015-06-16 ENCOUNTER — Ambulatory Visit
Admission: RE | Admit: 2015-06-16 | Discharge: 2015-06-16 | Disposition: A | Discharge status: Home / Self Care | Payer: 59 | Source: Ambulatory Visit | Attending: Urology | Admitting: Urology

## 2015-06-16 ENCOUNTER — Ambulatory Visit (INDEPENDENT_AMBULATORY_CARE_PROVIDER_SITE_OTHER): Payer: 59 | Admitting: Urology

## 2015-06-16 VITALS — BP 149/83 | HR 78 | Ht 64.0 in | Wt 286.3 lb

## 2015-06-16 DIAGNOSIS — N200 Calculus of kidney: Secondary | ICD-10-CM | POA: Diagnosis not present

## 2015-06-16 DIAGNOSIS — Z87442 Personal history of urinary calculi: Secondary | ICD-10-CM | POA: Diagnosis not present

## 2015-06-16 LAB — URINALYSIS, COMPLETE
BILIRUBIN UA: NEGATIVE
GLUCOSE, UA: NEGATIVE
KETONES UA: NEGATIVE
LEUKOCYTES UA: NEGATIVE
Nitrite, UA: NEGATIVE
PROTEIN UA: NEGATIVE
SPEC GRAV UA: 1.01 (ref 1.005–1.030)
UUROB: 0.2 mg/dL (ref 0.2–1.0)
pH, UA: 5 (ref 5.0–7.5)

## 2015-06-16 LAB — MICROSCOPIC EXAMINATION

## 2015-06-16 MED ORDER — TAMSULOSIN HCL 0.4 MG PO CAPS: 0.4000 mg | ORAL_CAPSULE | Freq: Every day | ORAL | Status: DC

## 2015-06-16 NOTE — Telephone Encounter (Signed)
[employer] Paperwork faxed for Saxenda.

## 2015-06-16 NOTE — Progress Notes (Signed)
06/16/2015 2:25 PM   [place] 03-14-1971 [id number]  Referring provider: Jackolyn Confer, MD [id number] [place], [place] [address]  Chief Complaint  Patient presents with  . Nephrolithiasis    HPI: The patient is a 45 year old female who recently underwent right ureteroscopy, laser lithotripsy, and stone removal as well as left ESWL. She returns today for routine follow-up. She did have some pain while passing stones in her left ureter. This has since resolved.   PMH: Past Medical History  Diagnosis Date  . Heart murmur   . Chicken pox   . Kidney stones   . Endometriosis     Dr. Rushie Chestnut at [facility], removed   . Cervical spine disease   . Chronic urethral narrowing     undergoing stretching  . H/O cystoscopy     normal  . Urinary retention   . Acid reflux   . Arthritis   . Skin cancer   . Complication of anesthesia   . PONV (postoperative nausea and vomiting)   . Esophageal reflux 05/25/2014  . Obesity, morbid, BMI 40.0-49.9 ([place]) 04/10/2012    Surgical History: Past Surgical History  Procedure Laterality Date  . Cholecystectomy    . Bladder surgery  07/2003  . Cesarean section  1998  . Abdominal hysterectomy      [facility] complete  . Tonsillectomy and adenoidectomy    . Knee surgery  2005  . Foot surgery  2003  . Carpal tunnel release Right   . Cystoscopy with stent placement Right 04/29/2015    Procedure: CYSTOSCOPY WITH STENT PLACEMENT;  Surgeon: Nickie Retort, MD;  Location: [facility] ORS;  Service: Urology;  Laterality: Right;  . Ureteroscopy with holmium laser lithotripsy Right 05/16/2015    Procedure: URETEROSCOPY WITH HOLMIUM LASER LITHOTRIPSY;  Surgeon: Nickie Retort, MD;  Location: [facility];  Service: Urology;  Laterality: Right;  . Cystoscopy w/ ureteral stent placement Right 05/16/2015    Procedure: CYSTOSCOPY WITH STENT REPLACEMENT;  Surgeon: Nickie Retort, MD;  Location: [facility] OR[facility];  Service: Urology;  Laterality:  Right;  . Cystoscopy w/ retrogrades Right 05/16/2015    Procedure: CYSTOSCOPY WITH RETROGRADE PYELOGRAM;  Surgeon: Nickie Retort, MD;  Location: [facility];  Service: Urology;  Laterality: Right;  . Extracorporeal shock wave lithotripsy Left 06/02/2015    Procedure: EXTRACORPOREAL SHOCK WAVE LITHOTRIPSY (ESWL);  Surgeon: Hollice Espy, MD;  Location: [facility];  Service: Urology;  Laterality: Left;    Home Medications:    Medication List       This list is accurate as of: 06/16/15  2:25 PM.  Always use your most recent med list.               ALLEGRA PO  Take 1 tablet by mouth every evening.     CALCIUM 600 PO  Take 1 tablet by mouth daily.     fluticasone 50 MCG/ACT nasal spray  Commonly known as:  FLONASE  Place 1 spray into the nose daily. As needed.     HYDROcodone-acetaminophen 5-325 MG tablet  Commonly known as:  NORCO/VICODIN  Take 1-2 tablets by mouth every 6 (six) hours as needed for moderate pain.     Insulin Pen Needle 33G X 5 MM Misc  1 application by Does not apply route daily.     L-ARGININE PO  Take 1 tablet by mouth. Reported on 06/16/2015     Liraglutide -Weight Management 18 MG/3ML Sopn  Commonly known as:  SAXENDA  Inject 0.6 mg into the skin daily.     meloxicam 7.5 MG tablet  Commonly known as:  MOBIC  Take 7.5 mg by mouth as needed. Reported on 06/16/2015     MENOPAUSE FORMULA Tabs  Take by mouth.     nystatin 100000 UNIT/GM Powd  Apply twice daily as needed     pantoprazole 40 MG tablet  Commonly known as:  PROTONIX  TAKE 1 TABLET BY MOUTH DAILY     pseudoephedrine 30 MG tablet  Commonly known as:  SUDAFED  Take 30 mg by mouth as needed. Reported on 06/16/2015     tamsulosin 0.4 MG Caps capsule  Commonly known as:  FLOMAX  Take 1 capsule (0.4 mg total) by mouth daily.     Vitamin D (Cholecalciferol) 1000 units Tabs  Take by mouth.     vitamin E 400 UNIT capsule  Take 400 Units by mouth daily.        Allergies: No Known  Allergies  Family History: Family History  Problem Relation Age of Onset  . Cancer Maternal Grandmother   . Diabetes Paternal Grandmother     Social History:  reports that she quit smoking about 7 years ago. Her smoking use included Cigarettes. She has a 8.5 pack-year smoking history. She does not have any smokeless tobacco history on file. She reports that she does not drink alcohol or use illicit drugs.  ROS: UROLOGY Frequent Urination?: No Hard to postpone urination?: No Burning/pain with urination?: No Get up at night to urinate?: Yes Leakage of urine?: No Urine stream starts and stops?: No Trouble starting stream?: No Do you have to strain to urinate?: No Blood in urine?: Yes Urinary tract infection?: Yes Sexually transmitted disease?: No Injury to kidneys or bladder?: Yes Painful intercourse?: No Weak stream?: No Currently pregnant?: No Vaginal bleeding?: No Last menstrual period?: n  Gastrointestinal Nausea?: No Vomiting?: No Indigestion/heartburn?: No Diarrhea?: No Constipation?: No  Constitutional Fever: No Night sweats?: No Weight loss?: No Fatigue?: No  Skin Skin rash/lesions?: No Itching?: No  Eyes Blurred vision?: No Double vision?: No  Ears/Nose/Throat Sore throat?: No Sinus problems?: No  Hematologic/Lymphatic Swollen glands?: No Easy bruising?: No  Cardiovascular Leg swelling?: No Chest pain?: No  Respiratory Cough?: No Shortness of breath?: No  Endocrine Excessive thirst?: Yes  Musculoskeletal Back pain?: No Joint pain?: No  Neurological Headaches?: No Dizziness?: No  Psychologic Depression?: No Anxiety?: No  Physical Exam: BP 149/83 mmHg  Pulse 78  Ht 5\' 4"  (1.626 m)  Wt 286 lb 4.8 oz (129.865 kg)  BMI 49.12 kg/m2  Constitutional:  Alert and oriented, No acute distress. HEENT: [place] AT, moist mucus membranes.  Trachea midline, no masses. Cardiovascular: No clubbing, cyanosis, or edema. Respiratory: Normal  respiratory effort, no increased work of breathing. GI: Abdomen is soft, nontender, nondistended, no abdominal masses GU: No CVA tenderness.  Skin: No rashes, bruises or suspicious lesions. Lymph: No cervical or inguinal adenopathy. Neurologic: Grossly intact, no focal deficits, moving all 4 extremities. Psychiatric: Normal mood and affect.  Laboratory Data: Lab Results  Component Value Date   WBC 6.6 06/01/2015   HGB 11.7* 06/01/2015   HCT 35.5* 06/01/2015   MCV 83.0 06/01/2015   PLT 240.0 06/01/2015    Lab Results  Component Value Date   CREATININE 0.80 06/01/2015    No results found for: PSA  No results found for: TESTOSTERONE  Lab Results  Component Value Date   HGBA1C 5.8 06/01/2015    Urinalysis  Component Value Date/Time   COLORURINE ORANGE 10/25/2012 2238   APPEARANCEUR HAZY 10/25/2012 2238   LABSPEC 1.027 10/25/2012 2238   PHURINE see comment 10/25/2012 2238   GLUCOSEU Negative 05/20/2015 1405   GLUCOSEU see comment 10/25/2012 2238   HGBUR see comment 10/25/2012 2238   BILIRUBINUR Negative 05/20/2015 1405   BILIRUBINUR see comment 10/25/2012 2238   BILIRUBINUR n 04/10/2012 1431   KETONESUR see comment 10/25/2012 2238   PROTEINUR see comment 10/25/2012 2238   PROTEINUR n 04/10/2012 1431   UROBILINOGEN 0.2 04/10/2012 1431   NITRITE Negative 05/20/2015 1405   NITRITE SEE COMMENT 10/25/2012 2238   NITRITE n 04/10/2012 1431   LEUKOCYTESUR 1+* 05/20/2015 1405   LEUKOCYTESUR see comment 10/25/2012 2238   LEUKOCYTESUR Negative 04/10/2012 1431    Pertinent Imaging: The patient's KUB from today appears to show stones in the distal left ureter.  Assessment & Plan:   1. Right ureteral stone No evidence of disease. Patient still needs post ureteroscopy ultrasound to rule out iatrogenic hydronephrosis. Will obtain prior to next visit in one month  2. Left renal stone The patient still appears to have stones in her distal left ureter on x-ray today. We  will refill her Flomax and repeat a KUB in 1 month.   Return in about 4 weeks (around 07/14/2015) for with KUB/renal u/s prior.  Nickie Retort, MD  [facility] [address], [place], [place] [address] [phone]

## 2015-06-17 ENCOUNTER — Ambulatory Visit: Payer: 59

## 2015-06-20 NOTE — Telephone Encounter (Signed)
Prior authorization for Saxenda approved from 06/17/15 until 10/15/15

## 2015-06-23 ENCOUNTER — Telehealth: Payer: Self-pay

## 2015-06-23 ENCOUNTER — Other Ambulatory Visit: Payer: 59

## 2015-06-23 DIAGNOSIS — R300 Dysuria: Secondary | ICD-10-CM | POA: Diagnosis not present

## 2015-06-23 LAB — URINALYSIS, COMPLETE
Bilirubin, UA: NEGATIVE
Glucose, UA: NEGATIVE
Ketones, UA: NEGATIVE
Leukocytes, UA: NEGATIVE
NITRITE UA: NEGATIVE
PH UA: 5.5 (ref 5.0–7.5)
Protein, UA: NEGATIVE
Specific Gravity, UA: 1.015 (ref 1.005–1.030)
Urobilinogen, Ur: 0.2 mg/dL (ref 0.2–1.0)

## 2015-06-23 LAB — MICROSCOPIC EXAMINATION

## 2015-06-23 NOTE — Telephone Encounter (Signed)
Pt called c/o dysuria and frequency. Per Dr. Pilar Jarvis pt should give [place] another urine for u/a and cx. Pt voiced understanding.

## 2015-06-25 LAB — CULTURE, URINE COMPREHENSIVE

## 2015-06-27 ENCOUNTER — Ambulatory Visit: Payer: 59

## 2015-07-01 ENCOUNTER — Ambulatory Visit: Payer: Self-pay | Admitting: Physician Assistant

## 2015-07-01 ENCOUNTER — Encounter: Payer: Self-pay | Admitting: Physician Assistant

## 2015-07-01 ENCOUNTER — Ambulatory Visit: Payer: 59

## 2015-07-01 VITALS — BP 134/86 | HR 84 | Temp 98.7°F

## 2015-07-01 DIAGNOSIS — J018 Other acute sinusitis: Secondary | ICD-10-CM

## 2015-07-01 MED ORDER — FLUCONAZOLE 150 MG PO TABS: ORAL_TABLET | ORAL | Status: DC

## 2015-07-01 MED ORDER — AMOXICILLIN-POT CLAVULANATE 875-125 MG PO TABS: 1.0000 | ORAL_TABLET | Freq: Two times a day (BID) | ORAL | Status: DC

## 2015-07-01 MED ORDER — FLUTICASONE PROPIONATE 50 MCG/ACT NA SUSP: 2.0000 | Freq: Every day | NASAL | Status: DC

## 2015-07-01 MED ORDER — PREDNISONE 10 MG PO TABS
30.0000 mg | ORAL_TABLET | Freq: Every day | ORAL | Status: DC
Start: 2015-07-01 — End: 2015-08-25

## 2015-07-01 NOTE — Progress Notes (Signed)
S: C/o runny nose and congestion for 6 days, some left ear pain for 2 weeks, no fever, chills, cp/sob, v/d; mucus is green and thick,  c/o of facial and dental pain.   Using otc meds: mucinex  O: PE: vitals wnl, nad,  perrl eomi, normocephalic, tms dull, nasal mucosa red and swollen, throat injected, neck supple no lymph, lungs c t a, cv rrr, neuro intact  A:  Acute sinusitis   P: augmentin 875mg  bid x 10d, prednisone 30mg  qd x 3d, flonase, diflucan; drink fluids, continue regular meds , use otc meds of choice, return if not improving in 5 days, return earlier if worsening

## 2015-07-04 ENCOUNTER — Telehealth: Payer: Self-pay

## 2015-07-04 NOTE — Telephone Encounter (Signed)
Pt called stating she went to employee health last week for sinus infection and the nurse told her, her BP was elevated at 134/86. Pt wanted to know if the medication was the problem or the myrbetriq samples. Made pt aware it could be either. Pt stated she think it is the sinus infection and requested to continue medication. Nurse made pt aware that is reasonable. Reinforced with pt to monitor BP and any s/s of elevated BP. Nurse also reinforced with pt to keep a log of BP and/or symptoms and bring to her f/u appt. Pt voiced understanding.

## 2015-07-08 ENCOUNTER — Telehealth: Payer: Self-pay | Admitting: Urology

## 2015-07-08 NOTE — Telephone Encounter (Signed)
Spoke with pt in reference to BP levels. Pt denied headache, dizziness, vision disturbances or any other s/s. Pt would like to continue myrbetriq.

## 2015-07-08 NOTE — Telephone Encounter (Signed)
Patient called today with a report of her blood pressure readings: 07/05/15 = 122/76 07/07/15 = 126/72 07/08/15 = 128/86

## 2015-07-13 ENCOUNTER — Ambulatory Visit
Admission: RE | Admit: 2015-07-13 | Discharge: 2015-07-13 | Disposition: A | Discharge status: Home / Self Care | Payer: 59 | Source: Ambulatory Visit | Attending: Urology | Admitting: Urology

## 2015-07-13 DIAGNOSIS — N200 Calculus of kidney: Secondary | ICD-10-CM | POA: Insufficient documentation

## 2015-07-14 ENCOUNTER — Encounter: Payer: Self-pay | Admitting: Urology

## 2015-07-14 ENCOUNTER — Ambulatory Visit (INDEPENDENT_AMBULATORY_CARE_PROVIDER_SITE_OTHER): Payer: 59 | Admitting: Urology

## 2015-07-14 VITALS — BP 141/79 | HR 64 | Ht 64.0 in | Wt 284.6 lb

## 2015-07-14 DIAGNOSIS — Z87442 Personal history of urinary calculi: Secondary | ICD-10-CM

## 2015-07-14 DIAGNOSIS — R3129 Other microscopic hematuria: Secondary | ICD-10-CM

## 2015-07-14 DIAGNOSIS — Z87448 Personal history of other diseases of urinary system: Secondary | ICD-10-CM

## 2015-07-14 LAB — MICROSCOPIC EXAMINATION: WBC, UA: NONE SEEN /hpf (ref 0–?)

## 2015-07-14 LAB — URINALYSIS, COMPLETE
Bilirubin, UA: NEGATIVE
GLUCOSE, UA: NEGATIVE
KETONES UA: NEGATIVE
LEUKOCYTES UA: NEGATIVE
Nitrite, UA: NEGATIVE
Protein, UA: NEGATIVE
Specific Gravity, UA: 1.025 (ref 1.005–1.030)
Urobilinogen, Ur: 0.2 mg/dL (ref 0.2–1.0)
pH, UA: 5 (ref 5.0–7.5)

## 2015-07-14 MED ORDER — NITROFURANTOIN MONOHYD MACRO 100 MG PO CAPS: 100.0000 mg | ORAL_CAPSULE | Freq: Two times a day (BID) | ORAL | Status: DC

## 2015-07-14 NOTE — Progress Notes (Signed)
8:14 PM   [place] Nov 08, 1970 [id number]  Referring provider: Jackolyn Confer, MD [id number] [place], [place] [address]  Chief Complaint  Patient presents with  . urethral narrowing    Dilation    HPI: Patient  is a 45 y/o Caucasian female with a h/o urethral stricture who presents today for a urethral dilation.  She was referred to [place] in 2013 for increased frequency, increased urgency and feelings of not emptying her bladder by Dr. Ronette Deter, M.D.  Per patient, she was diagnosed by Dr. Madelin Headings with urethral dilation and had been receiving serial dilations every 4 to 6 months since 2013.  After she has a dilation, her symptoms of frequency, increased urgency and feelings of not emptying her bladder abate for 3 to 6 months.    She also has a history of right ureteral stones treated with ESWL and right URS/LL/right ureteral stent placement and stent removal in winter 2016.  A recent renal ultrasound completed on 07/13/2015 did not note any hydronephrosis or nephrolithiasis. KUB is pending at this time.  Today, patient presents requesting urethral dilatation. She is experiencing increasing frequent urination and nocturia.  Her UA is positive for 3-10 RBC's per high-power field.  She is not experiencing any gross hematuria, dysuria or suprapubic pain. She has not had any flank pain. She is on any fevers, chills, nausea or vomiting.   PMH: Past Medical History  Diagnosis Date  . Heart murmur   . Chicken pox   . Kidney stones   . Endometriosis     [facility]. Rushie Chestnut at [facility], removed   . Cervical spine disease   . Chronic urethral narrowing     undergoing stretching  . H/O cystoscopy     normal  . Urinary retention   . Acid reflux   . Arthritis   . Skin cancer   . Complication of anesthesia   . PONV (postoperative nausea and vomiting)   . Esophageal reflux 05/25/2014  . Obesity, morbid, BMI 40.0-49.9 ([place]) 04/10/2012    Surgical History: Past  Surgical History  Procedure Laterality Date  . Cholecystectomy    . Bladder surgery  07/2003  . Cesarean section  1998  . Abdominal hysterectomy      [facility] complete  . Tonsillectomy and adenoidectomy    . Knee surgery  2005  . Foot surgery  2003  . Carpal tunnel release Right   . Cystoscopy with stent placement Right 04/29/2015    Procedure: CYSTOSCOPY WITH STENT PLACEMENT;  Surgeon: Nickie Retort, MD;  Location: [facility];  Service: Urology;  Laterality: Right;  . Ureteroscopy with holmium laser lithotripsy Right 05/16/2015    Procedure: URETEROSCOPY WITH HOLMIUM LASER LITHOTRIPSY;  Surgeon: Nickie Retort, MD;  Location: [facility];  Service: Urology;  Laterality: Right;  . Cystoscopy w/ ureteral stent placement Right 05/16/2015    Procedure: CYSTOSCOPY WITH STENT REPLACEMENT;  Surgeon: Nickie Retort, MD;  Location: [facility] OR[facility];  Service: Urology;  Laterality: Right;  . Cystoscopy w/ retrogrades Right 05/16/2015    Procedure: CYSTOSCOPY WITH RETROGRADE PYELOGRAM;  Surgeon: Nickie Retort, MD;  Location: [facility] OR[facility];  Service: Urology;  Laterality: Right;  . Extracorporeal shock wave lithotripsy Left 06/02/2015    Procedure: EXTRACORPOREAL SHOCK WAVE LITHOTRIPSY (ESWL);  Surgeon: Hollice Espy, MD;  Location: [facility] OR[facility];  Service: Urology;  Laterality: Left;    Home Medications:    Medication List       This list is accurate  as of: 07/14/15 11:59 PM.  Always use your most recent med list.               ALLEGRA PO  Take 1 tablet by mouth every evening.     amoxicillin-clavulanate 875-125 MG tablet  Commonly known as:  AUGMENTIN  Take 1 tablet by mouth 2 (two) times daily.     CALCIUM 600 PO  Take 1 tablet by mouth daily.     fluconazole 150 MG tablet  Commonly known as:  DIFLUCAN  Take one now and one in a week     fluticasone 50 MCG/ACT nasal spray  Commonly known as:  FLONASE  Place 2 sprays into both nostrils daily. As needed.     HYDROcodone-acetaminophen 5-325  MG tablet  Commonly known as:  NORCO/VICODIN  Take 1-2 tablets by mouth every 6 (six) hours as needed for moderate pain.     Insulin Pen Needle 33G X 5 MM Misc  1 application by Does not apply route daily.     L-ARGININE PO  Take 1 tablet by mouth. Reported on 07/01/2015     Liraglutide -Weight Management 18 MG/3ML Sopn  Commonly known as:  SAXENDA  Inject 0.6 mg into the skin daily.     meloxicam 7.5 MG tablet  Commonly known as:  MOBIC  Take 7.5 mg by mouth as needed. Reported on 06/16/2015     MENOPAUSE FORMULA Tabs  Take by mouth.     nitrofurantoin (macrocrystal-monohydrate) 100 MG capsule  Commonly known as:  MACROBID  Take 1 capsule (100 mg total) by mouth every 12 (twelve) hours.     nystatin 100000 UNIT/GM Powd  Apply twice daily as needed     pantoprazole 40 MG tablet  Commonly known as:  PROTONIX  TAKE 1 TABLET BY MOUTH DAILY     predniSONE 10 MG tablet  Commonly known as:  DELTASONE  Take 3 tablets (30 mg total) by mouth daily with breakfast.     pseudoephedrine 30 MG tablet  Commonly known as:  SUDAFED  Take 30 mg by mouth as needed. Reported on 06/16/2015     tamsulosin 0.4 MG Caps capsule  Commonly known as:  FLOMAX  Take 1 capsule (0.4 mg total) by mouth daily.     Vitamin D (Cholecalciferol) 1000 units Tabs  Take by mouth.     vitamin E 400 UNIT capsule  Take 400 Units by mouth daily.        Allergies: No Known Allergies  Family History: Family History  Problem Relation Age of Onset  . Cancer Maternal Grandmother   . Diabetes Paternal Grandmother   . Kidney disease Neg Hx   . Bladder Cancer Neg Hx     Social History:  reports that she quit smoking about 7 years ago. Her smoking use included Cigarettes. She has a 8.5 pack-year smoking history. She does not have any smokeless tobacco history on file. She reports that she does not drink alcohol or use illicit drugs.  ROS: Urological Symptom Review  Patient is experiencing the following  symptoms: Frequent urination Hard to postpone urination   Review of Systems  Gastrointestinal (upper)  : Negative for upper GI symptoms  Gastrointestinal (lower) : Negative for lower GI symptoms  Constitutional : Negative for symptoms  Skin: Negative for skin symptoms  Eyes: Negative for eye symptoms  Ear/Nose/Throat : Negative for Ear/Nose/Throat symptoms  Hematologic/Lymphatic: Negative for Hematologic/Lymphatic symptoms  Cardiovascular : Negative for cardiovascular symptoms  Respiratory : Negative  for respiratory symptoms  Endocrine: Negative for endocrine symptoms  Musculoskeletal: Negative for musculoskeletal symptoms  Neurological: Negative for neurological symptoms  Psychologic: Negative for psychiatric symptoms   Physical Exam: Blood pressure 141/79, pulse 64, height 5\' 4"  (1.626 m), weight 284 lb 9.6 oz (129.094 kg). GU:  Normal external genitalia.  Normal urethral meatus. No urethral masses and/or tenderness. No bladder fullness or masses. No vaginal lesions or discharge. Normal rectal tone, no masses. Normal anus and perineum.   Laboratory Data:  Urinalysis Results for orders placed or performed in visit on 07/14/15  CULTURE, URINE COMPREHENSIVE  Result Value Ref Range   Urine Culture, Comprehensive Final report    Result 1 Comment   Microscopic Examination  Result Value Ref Range   WBC, UA None seen 0 -  5 /hpf   RBC, UA 3-10 (A) 0 -  2 /hpf   Epithelial Cells (non renal) 0-10 0 - 10 /hpf   Crystals Present (A) N/A   Crystal Type Calcium Oxalate N/A   Mucus, UA Present (A) Not Estab.   Bacteria, UA Many (A) None seen/Few  Urinalysis, Complete  Result Value Ref Range   Specific Gravity, UA 1.025 1.005 - 1.030   pH, UA 5.0 5.0 - 7.5   Color, UA Yellow Yellow   Appearance Ur Cloudy (A) Clear   Leukocytes, UA Negative Negative   Protein, UA Negative Negative/Trace   Glucose, UA Negative Negative   Ketones, UA Negative Negative    RBC, UA Trace (A) Negative   Bilirubin, UA Negative Negative   Urobilinogen, Ur 0.2 0.2 - 1.0 mg/dL   Nitrite, UA Negative Negative   Microscopic Examination See below:     Procedure:   Patient is placed in stirrups and her urethral meatus and vulva are cleansed with Betadine.  2% Lidocaine jelly was inserted into her urethra.  I then dilated her with Elby Showers sounds to a 55f without difficultly.  She tolerated the procedure well.  She will return in 4 months.  Assessment & Plan:    1. 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  Her UA was positive for microscopic hematuria. I will send it for culture. I have empirically started her on Macrobid.  She will follow up in four months time for next dilation.  - Urinalysis, Complete  2. History of right ureter stones:   Patient status post ESWL and right ureteroscopic 4 right ureteral stones. Recent renal ultrasound did not note any nephrolithiasis or hydronephrosis. She has a KUB pending. Her UA is positive for 3-10 RBC's per high-power field. She has an upcoming appointment with Dr. Pilar Jarvis.    3. Microscopic hematuria:   I sent the urine for culture. I have empirically started her on Macrobid. We'll continue to monitor.   Return for keep follow up appoinment on the 9th with KUB prior.  Zara Council, [facility] [address], [place], [place] [address] [phone]

## 2015-07-15 ENCOUNTER — Telehealth: Payer: Self-pay

## 2015-07-15 NOTE — Telephone Encounter (Signed)
-----   Message from [place], RN sent at 07/15/2015  9:32 AM EST ----- Regarding: renal US results   ----- Message -----    From: Rad Results In Interface    Sent: 07/13/2015   2:18 PM      To: Amy Linton Flemings, RN

## 2015-07-15 NOTE — Telephone Encounter (Signed)
Ultrasound is normal. She is seeing me after getting a KUB next week.

## 2015-07-15 NOTE — Telephone Encounter (Signed)
Did you want me to do something with this?

## 2015-07-16 DIAGNOSIS — Z87448 Personal history of other diseases of urinary system: Secondary | ICD-10-CM | POA: Insufficient documentation

## 2015-07-16 LAB — CULTURE, URINE COMPREHENSIVE

## 2015-07-18 NOTE — Telephone Encounter (Signed)
Spoke with pt in reference to -ucx and KUB. Pt voiced understanding.

## 2015-07-18 NOTE — Telephone Encounter (Signed)
-----   Message from Nori Riis, PA-C sent at 07/16/2015  8:00 PM EST ----- Please let the patient know that her urine culture is negative.  She also needs a KUB prior to her next appointment.

## 2015-07-19 ENCOUNTER — Other Ambulatory Visit: Payer: Self-pay | Admitting: Urology

## 2015-07-19 DIAGNOSIS — J018 Other acute sinusitis: Secondary | ICD-10-CM

## 2015-07-19 MED ORDER — FLUCONAZOLE 150 MG PO TABS: 150.0000 mg | ORAL_TABLET | Freq: Every day | ORAL | Status: DC

## 2015-07-19 MED ORDER — FLUCONAZOLE 150 MG PO TABS: ORAL_TABLET | ORAL | Status: DC

## 2015-07-21 ENCOUNTER — Ambulatory Visit (INDEPENDENT_AMBULATORY_CARE_PROVIDER_SITE_OTHER): Payer: 59 | Admitting: Urology

## 2015-07-21 ENCOUNTER — Ambulatory Visit
Admission: RE | Admit: 2015-07-21 | Discharge: 2015-07-21 | Disposition: A | Discharge status: Home / Self Care | Payer: 59 | Source: Ambulatory Visit | Attending: Urology | Admitting: Urology

## 2015-07-21 ENCOUNTER — Encounter: Payer: Self-pay | Admitting: Urology

## 2015-07-21 VITALS — BP 123/79 | HR 89 | Ht 64.0 in | Wt 286.0 lb

## 2015-07-21 DIAGNOSIS — R9349 Abnormal radiologic findings on diagnostic imaging of other urinary organs: Secondary | ICD-10-CM | POA: Insufficient documentation

## 2015-07-21 DIAGNOSIS — N200 Calculus of kidney: Secondary | ICD-10-CM

## 2015-07-21 DIAGNOSIS — R350 Frequency of micturition: Secondary | ICD-10-CM | POA: Diagnosis not present

## 2015-07-21 DIAGNOSIS — N201 Calculus of ureter: Secondary | ICD-10-CM

## 2015-07-21 MED ORDER — TAMSULOSIN HCL 0.4 MG PO CAPS: 0.4000 mg | ORAL_CAPSULE | Freq: Every day | ORAL | Status: DC

## 2015-07-21 NOTE — Progress Notes (Signed)
07/21/2015 2:27 PM   [place] 01-Jul-1970 [id number]  Referring provider: Jackolyn Confer, MD [id number] [place], [place] [address]  Chief Complaint  Patient presents with  . Follow-up    KUB    HPI: The patient is a 45 year old female who recently underwent right ureteroscopy, laser lithotripsy, and stone removal as well as left ESWL. She returns today for routine follow-up. She did have some pain while passing stones in her left ureter. This has since resolved.  Her new complaint now is urinary frequency. This has been going on for 3 weeks. She denies any flank pain. She had a urethral dilation last week which did not help. Her renal ultrasound is unremarkable for hydronephrosis bilaterally. Her KUB today shows persistent left distal ureteral stone though it is decreased in volume since her last KUB. She had a negative urine culture last week that she did have microscopic hematuria. She denies fevers chills nausea and vomiting.   PVR: 58 cc  PMH: Past Medical History  Diagnosis Date  . Heart murmur   . Chicken pox   . Kidney stones   . Endometriosis     Dr. Rushie Chestnut at [facility], removed   . Cervical spine disease   . Chronic urethral narrowing     undergoing stretching  . H/O cystoscopy     normal  . Urinary retention   . Acid reflux   . Arthritis   . Skin cancer   . Complication of anesthesia   . PONV (postoperative nausea and vomiting)   . Esophageal reflux 05/25/2014  . Obesity, morbid, BMI 40.0-49.9 ([place]) 04/10/2012  . Candidal dermatitis 05/25/2014  . Gross hematuria 04/25/2015  . Infection of urinary tract 09/11/2014  . Kidney stone 09/11/2014  . Microscopic hematuria 09/11/2014  . Right ureteral stone 04/28/2015    Surgical History: Past Surgical History  Procedure Laterality Date  . Cholecystectomy    . Bladder surgery  07/2003  . Cesarean section  1998  . Abdominal hysterectomy      [facility] complete  . Tonsillectomy and adenoidectomy     . Knee surgery  2005  . Foot surgery  2003  . Carpal tunnel release Right   . Cystoscopy with stent placement Right 04/29/2015    Procedure: CYSTOSCOPY WITH STENT PLACEMENT;  Surgeon: Nickie Retort, MD;  Location: [facility] ORS;  Service: Urology;  Laterality: Right;  . Ureteroscopy with holmium laser lithotripsy Right 05/16/2015    Procedure: URETEROSCOPY WITH HOLMIUM LASER LITHOTRIPSY;  Surgeon: Nickie Retort, MD;  Location: [facility] ORS;  Service: Urology;  Laterality: Right;  . Cystoscopy w/ ureteral stent placement Right 05/16/2015    Procedure: CYSTOSCOPY WITH STENT REPLACEMENT;  Surgeon: Nickie Retort, MD;  Location: [facility] OR[facility];  Service: Urology;  Laterality: Right;  . Cystoscopy w/ retrogrades Right 05/16/2015    Procedure: CYSTOSCOPY WITH RETROGRADE PYELOGRAM;  Surgeon: Nickie Retort, MD;  Location: [facility] OR[facility];  Service: Urology;  Laterality: Right;  . Extracorporeal shock wave lithotripsy Left 06/02/2015    Procedure: EXTRACORPOREAL SHOCK WAVE LITHOTRIPSY (ESWL);  Surgeon: Hollice Espy, MD;  Location: [facility] ORS;  Service: Urology;  Laterality: Left;    Home Medications:    Medication List       This list is accurate as of: 07/21/15  2:27 PM.  Always use your most recent med list.               ALLEGRA PO  Take 1 tablet by mouth every evening.  amoxicillin-clavulanate 875-125 MG tablet  Commonly known as:  AUGMENTIN  Take 1 tablet by mouth 2 (two) times daily.     CALCIUM 600 PO  Take 1 tablet by mouth daily.     fluconazole 150 MG tablet  Commonly known as:  DIFLUCAN  Take 1 tablet (150 mg total) by mouth daily.     fluconazole 150 MG tablet  Commonly known as:  DIFLUCAN  Take one now and one in a week     fluticasone 50 MCG/ACT nasal spray  Commonly known as:  FLONASE  Place 2 sprays into both nostrils daily. As needed.     HYDROcodone-acetaminophen 5-325 MG tablet  Commonly known as:  NORCO/VICODIN  Take 1-2 tablets by mouth every 6 (six) hours  as needed for moderate pain.     Insulin Pen Needle 33G X 5 MM Misc  1 application by Does not apply route daily.     L-ARGININE PO  Take 1 tablet by mouth. Reported on 07/01/2015     Liraglutide -Weight Management 18 MG/3ML Sopn  Commonly known as:  SAXENDA  Inject 0.6 mg into the skin daily.     meloxicam 7.5 MG tablet  Commonly known as:  MOBIC  Take 7.5 mg by mouth as needed. Reported on 07/21/2015     MENOPAUSE FORMULA Tabs  Take by mouth.     nitrofurantoin (macrocrystal-monohydrate) 100 MG capsule  Commonly known as:  MACROBID  Take 1 capsule (100 mg total) by mouth every 12 (twelve) hours.     nystatin 100000 UNIT/GM Powd  Apply twice daily as needed     pantoprazole 40 MG tablet  Commonly known as:  PROTONIX  TAKE 1 TABLET BY MOUTH DAILY     predniSONE 10 MG tablet  Commonly known as:  DELTASONE  Take 3 tablets (30 mg total) by mouth daily with breakfast.     pseudoephedrine 30 MG tablet  Commonly known as:  SUDAFED  Take 30 mg by mouth as needed. Reported on 06/16/2015     tamsulosin 0.4 MG Caps capsule  Commonly known as:  FLOMAX  Take 1 capsule (0.4 mg total) by mouth daily.     tamsulosin 0.4 MG Caps capsule  Commonly known as:  FLOMAX  Take 1 capsule (0.4 mg total) by mouth daily.     Vitamin D (Cholecalciferol) 1000 units Tabs  Take by mouth.     vitamin E 400 UNIT capsule  Take 400 Units by mouth daily.        Allergies: No Known Allergies  Family History: Family History  Problem Relation Age of Onset  . Cancer Maternal Grandmother   . Diabetes Paternal Grandmother   . Kidney disease Neg Hx   . Bladder Cancer Neg Hx     Social History:  reports that she quit smoking about 7 years ago. Her smoking use included Cigarettes. She has a 8.5 pack-year smoking history. She does not have any smokeless tobacco history on file. She reports that she does not drink alcohol or use illicit drugs.  ROS:                                         Physical Exam: BP 123/79 mmHg  Pulse 89  Ht 5\' 4"  (1.626 m)  Wt 286 lb (129.729 kg)  BMI 49.07 kg/m2  Constitutional:  Alert and oriented, No acute distress. HEENT: [place]  AT, moist mucus membranes.  Trachea midline, no masses. Cardiovascular: No clubbing, cyanosis, or edema. Respiratory: Normal respiratory effort, no increased work of breathing. GI: Abdomen is soft, nontender, nondistended, no abdominal masses GU: No CVA tenderness.  Skin: No rashes, bruises or suspicious lesions. Lymph: No cervical or inguinal adenopathy. Neurologic: Grossly intact, no focal deficits, moving all 4 extremities. Psychiatric: Normal mood and affect.  Laboratory Data: Lab Results  Component Value Date   WBC 6.6 06/01/2015   HGB 11.7* 06/01/2015   HCT 35.5* 06/01/2015   MCV 83.0 06/01/2015   PLT 240.0 06/01/2015    Lab Results  Component Value Date   CREATININE 0.80 06/01/2015    No results found for: PSA  No results found for: TESTOSTERONE  Lab Results  Component Value Date   HGBA1C 5.8 06/01/2015    Urinalysis    Component Value Date/Time   COLORURINE ORANGE 10/25/2012 2238   APPEARANCEUR HAZY 10/25/2012 2238   LABSPEC 1.027 10/25/2012 2238   PHURINE see comment 10/25/2012 2238   GLUCOSEU Negative 07/14/2015 1132   GLUCOSEU see comment 10/25/2012 2238   HGBUR see comment 10/25/2012 2238   BILIRUBINUR Negative 07/14/2015 1132   BILIRUBINUR see comment 10/25/2012 2238   BILIRUBINUR n 04/10/2012 1431   KETONESUR see comment 10/25/2012 2238   PROTEINUR see comment 10/25/2012 2238   PROTEINUR n 04/10/2012 1431   UROBILINOGEN 0.2 04/10/2012 1431   NITRITE Negative 07/14/2015 1132   NITRITE SEE COMMENT 10/25/2012 2238   NITRITE n 04/10/2012 1431   LEUKOCYTESUR Negative 07/14/2015 1132   LEUKOCYTESUR see comment 10/25/2012 2238   LEUKOCYTESUR Negative 04/10/2012 1431    Pertinent Imaging: CLINICAL DATA: Nephrolithiasis  EXAM: RENAL / URINARY TRACT ULTRASOUND  COMPLETE  COMPARISON: 04/27/2015  FINDINGS: Right Kidney:  Length: 13.3 cm. Echogenicity within normal limits. No mass or hydronephrosis visualized.  Left Kidney:  Length: 12.3 cm. Echogenicity within normal limits. No mass or hydronephrosis visualized.  Bladder:  Appears normal for degree of bladder distention.  IMPRESSION: No acute abnormality noted. The previously seen right-sided hydronephrosis is no longer identified.  KUB today shows persistent but decreased volume of left ureteral stone distally  Assessment & Plan:    1. Right ureteral stone No evidence of disease. Negative post procedure renal ultrasound  2. Left renal stone s/p ESWL The patient still appears to have stones in her distal left ureter on x-ray today, though there are far fewer stones than previous KUB.  This is likely the cause of her urinary frequency as the stones are likely in the intramural segment of her ureter. As she is pain-free at this time and her ultrasound did not show hydronephrosis, she would like to continue medical expulsive therapy. We will refill her Flomax and repeat a KUB in 1 month.   3. Urethral stricture Continue planned follow up with Zara Council, PA  4. Microscopic hematuria Will ensure hematuria resolves after stone passage.  Nickie Retort, MD  [facility] [address], [place], [place] [address] [phone]

## 2015-07-22 LAB — MICROSCOPIC EXAMINATION
Bacteria, UA: NONE SEEN
WBC UA: NONE SEEN /HPF (ref 0–?)

## 2015-07-22 LAB — URINALYSIS, COMPLETE
BILIRUBIN UA: NEGATIVE
Glucose, UA: NEGATIVE
Ketones, UA: NEGATIVE
LEUKOCYTES UA: NEGATIVE
Nitrite, UA: NEGATIVE
PH UA: 5 (ref 5.0–7.5)
Urobilinogen, Ur: 0.2 mg/dL (ref 0.2–1.0)

## 2015-08-01 ENCOUNTER — Ambulatory Visit: Payer: 59 | Admitting: Urology

## 2015-08-09 ENCOUNTER — Telehealth: Payer: Self-pay

## 2015-08-09 NOTE — Telephone Encounter (Signed)
Pt called stating she attempted to come off of myrbetriq yesterday and was unsuccessful. Pt stated that by the afternoon she was urinating every 56min. Pt requested to continue medication and stated she needed more samples. Samples were given to get pt until f/u appt with Dr. Pilar Jarvis.

## 2015-08-25 ENCOUNTER — Ambulatory Visit (INDEPENDENT_AMBULATORY_CARE_PROVIDER_SITE_OTHER): Payer: 59 | Admitting: Urology

## 2015-08-25 ENCOUNTER — Ambulatory Visit
Admission: RE | Admit: 2015-08-25 | Discharge: 2015-08-25 | Disposition: A | Discharge status: Home / Self Care | Payer: 59 | Source: Ambulatory Visit | Attending: Urology | Admitting: Urology

## 2015-08-25 ENCOUNTER — Encounter: Payer: Self-pay | Admitting: Urology

## 2015-08-25 VITALS — BP 151/81 | HR 87 | Ht 65.0 in | Wt 287.0 lb

## 2015-08-25 DIAGNOSIS — N200 Calculus of kidney: Secondary | ICD-10-CM

## 2015-08-25 DIAGNOSIS — R3129 Other microscopic hematuria: Secondary | ICD-10-CM

## 2015-08-25 DIAGNOSIS — N3592 Unspecified urethral stricture, female: Secondary | ICD-10-CM

## 2015-08-25 DIAGNOSIS — N359 Urethral stricture, unspecified: Secondary | ICD-10-CM | POA: Diagnosis not present

## 2015-08-25 LAB — URINALYSIS, COMPLETE
Bilirubin, UA: NEGATIVE
Glucose, UA: NEGATIVE
Ketones, UA: NEGATIVE
Leukocytes, UA: NEGATIVE
NITRITE UA: NEGATIVE
PH UA: 5.5 (ref 5.0–7.5)
Protein, UA: NEGATIVE
Specific Gravity, UA: 1.015 (ref 1.005–1.030)
UUROB: 0.2 mg/dL (ref 0.2–1.0)

## 2015-08-25 LAB — MICROSCOPIC EXAMINATION: BACTERIA UA: NONE SEEN

## 2015-08-25 NOTE — Progress Notes (Signed)
08/25/2015 2:01 PM   [place] 12/13/1970 [id number]  Referring provider: Jackolyn Confer, MD [id number] [place], [place] [address]  Chief Complaint  Patient presents with  . Results    HPI: The patient is a 45 year old female who recently underwent right ureteroscopy, laser lithotripsy, and stone removal as well as left ESWL.  The patient presents for follow-up today with a repeat KUB. This appears to show resolution of her left distal ureteral stone though though there is a large stool burden in the distal colon that may be obstructing this view. There is a phlebolith in the left lower pelvis that has been there since the beginning and is not a stone.   She also has been on Myrbetriq for overactive bladder symptoms. These are presumably due to a stone in the distal left ureter should not have these symptoms prior. She is unsure she is passing a stone fragments. She still has microscopic hematuria.   PMH: Past Medical History  Diagnosis Date  . Heart murmur   . Chicken pox   . Kidney stones   . Endometriosis     Dr. Rushie Chestnut at [facility], removed   . Cervical spine disease   . Chronic urethral narrowing     undergoing stretching  . H/O cystoscopy     normal  . Urinary retention   . Acid reflux   . Arthritis   . Skin cancer   . Complication of anesthesia   . PONV (postoperative nausea and vomiting)   . Esophageal reflux 05/25/2014  . Obesity, morbid, BMI 40.0-49.9 ([place]) 04/10/2012  . Candidal dermatitis 05/25/2014  . Gross hematuria 04/25/2015  . Infection of urinary tract 09/11/2014  . Kidney stone 09/11/2014  . Microscopic hematuria 09/11/2014  . Right ureteral stone 04/28/2015    Surgical History: Past Surgical History  Procedure Laterality Date  . Cholecystectomy    . Bladder surgery  07/2003  . Cesarean section  1998  . Abdominal hysterectomy      [facility] complete  . Tonsillectomy and adenoidectomy    . Knee surgery  2005  . Foot surgery   2003  . Carpal tunnel release Right   . Cystoscopy with stent placement Right 04/29/2015    Procedure: CYSTOSCOPY WITH STENT PLACEMENT;  Surgeon: Nickie Retort, MD;  Location: [facility] OR[facility];  Service: Urology;  Laterality: Right;  . Ureteroscopy with holmium laser lithotripsy Right 05/16/2015    Procedure: URETEROSCOPY WITH HOLMIUM LASER LITHOTRIPSY;  Surgeon: Nickie Retort, MD;  Location: [facility] OR[facility];  Service: Urology;  Laterality: Right;  . Cystoscopy w/ ureteral stent placement Right 05/16/2015    Procedure: CYSTOSCOPY WITH STENT REPLACEMENT;  Surgeon: Nickie Retort, MD;  Location: [facility] OR[facility];  Service: Urology;  Laterality: Right;  . Cystoscopy w/ retrogrades Right 05/16/2015    Procedure: CYSTOSCOPY WITH RETROGRADE PYELOGRAM;  Surgeon: Nickie Retort, MD;  Location: [facility];  Service: Urology;  Laterality: Right;  . Extracorporeal shock wave lithotripsy Left 06/02/2015    Procedure: EXTRACORPOREAL SHOCK WAVE LITHOTRIPSY (ESWL);  Surgeon: Hollice Espy, MD;  Location: [facility] OR[facility];  Service: Urology;  Laterality: Left;    Home Medications:    Medication List       This list is accurate as of: 08/25/15  2:01 PM.  Always use your most recent med list.               ALLEGRA PO  Take 1 tablet by mouth every evening.     CALCIUM  600 PO  Take 1 tablet by mouth daily.     esomeprazole 20 MG capsule  Commonly known as:  NEXIUM  Take 20 mg by mouth daily at 12 noon.     fluticasone 50 MCG/ACT nasal spray  Commonly known as:  FLONASE  Place 2 sprays into both nostrils daily. As needed.     HYDROcodone-acetaminophen 5-325 MG tablet  Commonly known as:  NORCO/VICODIN  Take 1-2 tablets by mouth every 6 (six) hours as needed for moderate pain.     Insulin Pen Needle 33G X 5 MM Misc  1 application by Does not apply route daily.     L-ARGININE PO  Take 1 tablet by mouth. Reported on 08/25/2015     Liraglutide -Weight Management 18 MG/3ML Sopn  Commonly known as:  SAXENDA    Inject 0.6 mg into the skin daily.     meloxicam 7.5 MG tablet  Commonly known as:  MOBIC  Take 7.5 mg by mouth as needed. Reported on 07/21/2015     MENOPAUSE FORMULA Tabs  Take by mouth.     MYRBETRIQ 50 MG Tb24 tablet  Generic drug:  mirabegron ER  Take 50 mg by mouth daily.     Vitamin D (Cholecalciferol) 1000 units Tabs  Take by mouth.     vitamin E 400 UNIT capsule  Take 400 Units by mouth daily.        Allergies: No Known Allergies  Family History: Family History  Problem Relation Age of Onset  . Cancer Maternal Grandmother   . Diabetes Paternal Grandmother   . Kidney disease Neg Hx   . Bladder Cancer Neg Hx     Social History:  reports that she quit smoking about 7 years ago. Her smoking use included Cigarettes. She has a 8.5 pack-year smoking history. She does not have any smokeless tobacco history on file. She reports that she does not drink alcohol or use illicit drugs.  ROS: UROLOGY Frequent Urination?: No Hard to postpone urination?: No Burning/pain with urination?: No Get up at night to urinate?: No Leakage of urine?: No Urine stream starts and stops?: No Trouble starting stream?: No Do you have to strain to urinate?: No Blood in urine?: No Urinary tract infection?: No Sexually transmitted disease?: No Injury to kidneys or bladder?: No Painful intercourse?: No Weak stream?: No Currently pregnant?: No Vaginal bleeding?: No Last menstrual period?: n  Gastrointestinal Nausea?: No Vomiting?: No Indigestion/heartburn?: No Diarrhea?: No Constipation?: No  Constitutional Fever: No Night sweats?: No Weight loss?: No Fatigue?: No  Skin Skin rash/lesions?: No Itching?: No  Eyes Blurred vision?: No Double vision?: No  Ears/Nose/Throat Sore throat?: No Sinus problems?: No  Hematologic/Lymphatic Swollen glands?: No Easy bruising?: No  Cardiovascular Leg swelling?: No Chest pain?: No  Respiratory Cough?: No Shortness of  breath?: No  Endocrine Excessive thirst?: No  Musculoskeletal Back pain?: No Joint pain?: No  Neurological Headaches?: Yes Dizziness?: No  Psychologic Depression?: No Anxiety?: No  Physical Exam: BP 151/81 mmHg  Pulse 87  Ht 5\' 5"  (1.651 m)  Wt 287 lb (130.182 kg)  BMI 47.76 kg/m2  Constitutional:  Alert and oriented, No acute distress. HEENT: [place] AT, moist mucus membranes.  Trachea midline, no masses. Cardiovascular: No clubbing, cyanosis, or edema. Respiratory: Normal respiratory effort, no increased work of breathing. GI: Abdomen is soft, nontender, nondistended, no abdominal masses GU: No CVA tenderness.  Skin: No rashes, bruises or suspicious lesions. Lymph: No cervical or inguinal adenopathy. Neurologic: Grossly intact, no  focal deficits, moving all 4 extremities. Psychiatric: Normal mood and affect.  Laboratory Data: Lab Results  Component Value Date   WBC 6.6 06/01/2015   HGB 11.7* 06/01/2015   HCT 35.5* 06/01/2015   MCV 83.0 06/01/2015   PLT 240.0 06/01/2015    Lab Results  Component Value Date   CREATININE 0.80 06/01/2015    No results found for: PSA  No results found for: TESTOSTERONE  Lab Results  Component Value Date   HGBA1C 5.8 06/01/2015    Urinalysis    Component Value Date/Time   COLORURINE ORANGE 10/25/2012 2238   APPEARANCEUR Cloudy* 07/21/2015 1358   APPEARANCEUR HAZY 10/25/2012 2238   LABSPEC 1.027 10/25/2012 2238   PHURINE see comment 10/25/2012 2238   GLUCOSEU Negative 07/21/2015 1358   GLUCOSEU see comment 10/25/2012 2238   HGBUR see comment 10/25/2012 2238   BILIRUBINUR Negative 07/21/2015 1358   BILIRUBINUR see comment 10/25/2012 2238   BILIRUBINUR n 04/10/2012 1431   KETONESUR see comment 10/25/2012 2238   PROTEINUR 1+* 07/21/2015 1358   PROTEINUR see comment 10/25/2012 2238   PROTEINUR n 04/10/2012 1431   UROBILINOGEN 0.2 04/10/2012 1431   NITRITE Negative 07/21/2015 1358   NITRITE SEE COMMENT 10/25/2012 2238    NITRITE n 04/10/2012 1431   LEUKOCYTESUR Negative 07/21/2015 1358   LEUKOCYTESUR see comment 10/25/2012 2238   LEUKOCYTESUR Negative 04/10/2012 1431    Pertinent Imaging: On my review the KUB, it appears that the left distal stone has since passed. However, the view was somewhat obstructed by stool burden in her distal colon. She does still have the flu with the left lower pelvis that is not the stone.  Assessment & Plan:    1. Right ureteral stone No evidence of disease. Negative post procedure renal ultrasound  2. Left renal stone s/p ESWL It appears the patient has passed her residual stone fragments. We will let the patient no if the read of the x-ray by the radiologist is different than my read.  3. Urethral stricture Continue planned follow up with Zara Council, PA  4. Microscopic hematuria The patient has an appointment with Zara Council in a few months for urethral dilation. We will check a urinalysis that time to look for microscopic hematuria.  5. Overactive bladder This was likely due to recent stone passage of a distal stone. She will stop her medication at this time. She'll call us for symptoms return.   Nickie Retort, MD  [facility] [address], [place], [place] [address] [phone]

## 2015-09-02 ENCOUNTER — Ambulatory Visit: Payer: Self-pay | Admitting: Physician Assistant

## 2015-09-02 ENCOUNTER — Encounter: Payer: Self-pay | Admitting: Physician Assistant

## 2015-09-02 VITALS — BP 179/88 | HR 80 | Temp 98.5°F

## 2015-09-02 DIAGNOSIS — J320 Chronic maxillary sinusitis: Secondary | ICD-10-CM

## 2015-09-02 MED ORDER — AMOXICILLIN 875 MG PO TABS: 875.0000 mg | ORAL_TABLET | Freq: Two times a day (BID) | ORAL | Status: DC

## 2015-09-02 MED ORDER — FEXOFENADINE-PSEUDOEPHED ER 60-120 MG PO TB12: 1.0000 | ORAL_TABLET | Freq: Two times a day (BID) | ORAL | Status: DC

## 2015-09-02 NOTE — Progress Notes (Signed)
   Subjective:Sinus problems    Patient ID: Latoya Lutz, female    DOB: [birth year], 45 y.o.   MRN: [id number]  HPI Patient c/o one week of sinus congestion, ear pressure, frontal headache and sore throat. Denies fever/chill, or N/V/D. Currently take OTC Allergra and Sudafed.    Review of Systems Obesity and GERD    Objective:   Physical Exam HEENT for left maxillary guarding, edematous nasal turbinates, bilateral edematous TM, and post nasal drainage.  Neck supple without adenopathy. Lungs CTA and Heart RRR.       Assessment & Plan: Left maxillary sinusitis   Allergra-D and Amoxil.  Follow up 3 days if no improvement.

## 2015-10-03 ENCOUNTER — Other Ambulatory Visit: Payer: 59

## 2015-10-03 ENCOUNTER — Other Ambulatory Visit: Payer: Self-pay

## 2015-10-03 DIAGNOSIS — R300 Dysuria: Secondary | ICD-10-CM

## 2015-10-03 DIAGNOSIS — N200 Calculus of kidney: Secondary | ICD-10-CM

## 2015-10-04 LAB — URINALYSIS, COMPLETE
Bilirubin, UA: NEGATIVE
Glucose, UA: NEGATIVE
Ketones, UA: NEGATIVE
Leukocytes, UA: NEGATIVE
NITRITE UA: NEGATIVE
Protein, UA: NEGATIVE
RBC UA: NEGATIVE
Specific Gravity, UA: >1.03 — ABNORMAL HIGH (ref 1.005–1.030)
UUROB: 0.2 mg/dL (ref 0.2–1.0)
pH, UA: 5.5 (ref 5.0–7.5)

## 2015-10-04 LAB — MICROSCOPIC EXAMINATION

## 2015-10-07 DIAGNOSIS — H52223 Regular astigmatism, bilateral: Secondary | ICD-10-CM | POA: Diagnosis not present

## 2015-10-07 LAB — CULTURE, URINE COMPREHENSIVE

## 2015-10-10 ENCOUNTER — Telehealth: Payer: Self-pay

## 2015-10-10 DIAGNOSIS — N390 Urinary tract infection, site not specified: Secondary | ICD-10-CM

## 2015-10-10 MED ORDER — FLUCONAZOLE 150 MG PO TABS: 150.0000 mg | ORAL_TABLET | Freq: Once | ORAL | Status: DC

## 2015-10-10 MED ORDER — AMOXICILLIN-POT CLAVULANATE 875-125 MG PO TABS: 1.0000 | ORAL_TABLET | Freq: Two times a day (BID) | ORAL | Status: AC

## 2015-10-10 NOTE — Telephone Encounter (Signed)
Spoke with pt in reference to +ucx. Made aware abx has been sent to pharmacy. Pt voiced understanding.  Pt requested diflucan. Please advise.

## 2015-10-10 NOTE — Telephone Encounter (Signed)
Medication sent to pharmacy  

## 2015-10-10 NOTE — Telephone Encounter (Signed)
That will be fine.  Diflucan 150 mg, one tablet, # 3.

## 2015-10-10 NOTE — Telephone Encounter (Signed)
-----   Message from Nori Riis, PA-C sent at 10/07/2015  4:06 PM EDT ----- Please notify the patient that she has a positive urine culture and we need to have her start Augmentin 875/125, one tablet twice daily for seven days.  Then we need to check an UA and urine culture 3 to 5 days after she completes her antibiotics.

## 2015-10-18 ENCOUNTER — Telehealth: Payer: Self-pay | Admitting: Internal Medicine

## 2015-10-18 NOTE — Telephone Encounter (Signed)
Pt wants to know if she can try  a 90 day supply of Omeprazal 40mg  the others are not working for her .Marland Kitchen Sent to [place]

## 2015-10-18 NOTE — Telephone Encounter (Signed)
Per her med list she is on Nexium, please advise a switch to Omeprazole? thanks

## 2015-10-18 NOTE — Telephone Encounter (Signed)
Patient needs a 71min appt to discuss changing. thanks

## 2015-10-18 NOTE — Telephone Encounter (Signed)
Needs to be seen if PPI is not "working"

## 2015-10-20 NOTE — Telephone Encounter (Signed)
Lm  On vm to call back and schedule an appointment

## 2015-10-26 ENCOUNTER — Encounter: Payer: Self-pay | Admitting: Urology

## 2015-10-26 ENCOUNTER — Ambulatory Visit (INDEPENDENT_AMBULATORY_CARE_PROVIDER_SITE_OTHER): Payer: 59 | Admitting: Urology

## 2015-10-26 VITALS — BP 125/72 | HR 71 | Ht 66.0 in | Wt 283.0 lb

## 2015-10-26 DIAGNOSIS — IMO0002 Reserved for concepts with insufficient information to code with codable children: Secondary | ICD-10-CM

## 2015-10-26 DIAGNOSIS — N200 Calculus of kidney: Secondary | ICD-10-CM | POA: Diagnosis not present

## 2015-10-26 DIAGNOSIS — Z87442 Personal history of urinary calculi: Secondary | ICD-10-CM | POA: Diagnosis not present

## 2015-10-26 DIAGNOSIS — N359 Urethral stricture, unspecified: Secondary | ICD-10-CM | POA: Diagnosis not present

## 2015-10-26 LAB — URINALYSIS, COMPLETE
Bilirubin, UA: NEGATIVE
Glucose, UA: NEGATIVE
Ketones, UA: NEGATIVE
LEUKOCYTES UA: NEGATIVE
Nitrite, UA: NEGATIVE
PH UA: 5.5 (ref 5.0–7.5)
Protein, UA: NEGATIVE
RBC, UA: NEGATIVE
Specific Gravity, UA: 1.025 (ref 1.005–1.030)
Urobilinogen, Ur: 0.2 mg/dL (ref 0.2–1.0)

## 2015-10-26 LAB — MICROSCOPIC EXAMINATION

## 2015-10-26 MED ORDER — AMOXICILLIN 875 MG PO TABS: 875.0000 mg | ORAL_TABLET | Freq: Two times a day (BID) | ORAL | Status: DC

## 2015-10-26 MED ORDER — LIDOCAINE HCL 2 % EX GEL: 1.0000 "application " | Freq: Once | CUTANEOUS | Status: DC

## 2015-10-26 NOTE — Progress Notes (Signed)
11:35 AM   [place] December 01, 1970 [id number]  Referring provider: Jackolyn Confer, MD [id number] [place], [place] [address]  Chief Complaint  Patient presents with  . Uretheral Stenosis    Dilation    HPI: Patient  is a 45 y/o Caucasian female with a h/o urethral stricture who presents today for a urethral dilation.   Background history She was referred to [place] in 2013 for increased frequency, increased urgency and feelings of not emptying her bladder by Dr. Ronette Deter, M.D.  Per patient, she was diagnosed by Dr. Madelin Headings with urethral dilation and had been receiving serial dilations every 4 to 6 months since 2013.  After she has a dilation, her symptoms of frequency, increased urgency and feelings of not emptying her bladder abate for 3 to 6 months.    She also has a history of right ureteral stones treated with ESWL and right URS/LL/right ureteral stent placement and stent removal in winter 2016.  A recent renal ultrasound completed on 07/13/2015 did not note any hydronephrosis or nephrolithiasis. KUB On 08/22/2015 noted probable interval passage of distal left ureteral calculi noted on prior KUB and a question of a small left renal calculus.  I have personally reviewed the films.  Today, patient presents requesting urethral dilatation. She is experiencing increasing frequent urination and nocturia.  She does state that it is not as severe as it was 3 months ago.   Her UA is unremarkable.  She is not experiencing any gross hematuria, dysuria or suprapubic pain. She has not had any flank pain. She is on any fevers, chills, nausea or vomiting.   PMH: Past Medical History  Diagnosis Date  . Heart murmur   . Chicken pox   . Kidney stones   . Endometriosis     Dr. Rushie Chestnut at [facility], removed   . Cervical spine disease   . Chronic urethral narrowing     undergoing stretching  . H/O cystoscopy     normal  . Urinary retention   . Acid reflux   . Arthritis    . Skin cancer   . Complication of anesthesia   . PONV (postoperative nausea and vomiting)   . Esophageal reflux 05/25/2014  . Obesity, morbid, BMI 40.0-49.9 ([facility]) 04/10/2012  . Candidal dermatitis 05/25/2014  . Gross hematuria 04/25/2015  . Infection of urinary tract 09/11/2014  . Kidney stone 09/11/2014  . Microscopic hematuria 09/11/2014  . Right ureteral stone 04/28/2015    Surgical History: Past Surgical History  Procedure Laterality Date  . Cholecystectomy    . Bladder surgery  07/2003  . Cesarean section  1998  . Abdominal hysterectomy      [facility] complete  . Tonsillectomy and adenoidectomy    . Knee surgery  2005  . Foot surgery  2003  . Carpal tunnel release Right   . Cystoscopy with stent placement Right 04/29/2015    Procedure: CYSTOSCOPY WITH STENT PLACEMENT;  Surgeon: Nickie Retort, MD;  Location: [facility] ORS;  Service: Urology;  Laterality: Right;  . Ureteroscopy with holmium laser lithotripsy Right 05/16/2015    Procedure: URETEROSCOPY WITH HOLMIUM LASER LITHOTRIPSY;  Surgeon: Nickie Retort, MD;  Location: [facility] OR[facility];  Service: Urology;  Laterality: Right;  . Cystoscopy w/ ureteral stent placement Right 05/16/2015    Procedure: CYSTOSCOPY WITH STENT REPLACEMENT;  Surgeon: Nickie Retort, MD;  Location: [facility];  Service: Urology;  Laterality: Right;  . Cystoscopy w/ retrogrades Right 05/16/2015  Procedure: CYSTOSCOPY WITH RETROGRADE PYELOGRAM;  Surgeon: Nickie Retort, MD;  Location: [facility];  Service: Urology;  Laterality: Right;  . Extracorporeal shock wave lithotripsy Left 06/02/2015    Procedure: EXTRACORPOREAL SHOCK WAVE LITHOTRIPSY (ESWL);  Surgeon: Hollice Espy, MD;  Location: [facility];  Service: Urology;  Laterality: Left;    Home Medications:    Medication List       This list is accurate as of: 10/26/15 11:35 AM.  Always use your most recent med list.               ALLEGRA PO  Take 1 tablet by mouth every evening. Reported on 10/26/2015       amoxicillin 875 MG tablet  Commonly known as:  AMOXIL  Take 1 tablet (875 mg total) by mouth 2 (two) times daily.     CALCIUM 600 PO  Take 1 tablet by mouth daily.     esomeprazole 20 MG capsule  Commonly known as:  NEXIUM  Take 20 mg by mouth daily at 12 noon. Reported on 10/26/2015     fexofenadine-pseudoephedrine 60-120 MG 12 hr tablet  Commonly known as:  ALLEGRA-D  Take 1 tablet by mouth 2 (two) times daily.     fluconazole 150 MG tablet  Commonly known as:  DIFLUCAN  Take 1 tablet (150 mg total) by mouth once.     fluticasone 50 MCG/ACT nasal spray  Commonly known as:  FLONASE  Place 2 sprays into both nostrils daily. As needed.     HYDROcodone-acetaminophen 5-325 MG tablet  Commonly known as:  NORCO/VICODIN  Take 1-2 tablets by mouth every 6 (six) hours as needed for moderate pain.     Insulin Pen Needle 33G X 5 MM Misc  1 application by Does not apply route daily.     L-ARGININE PO  Take 1 tablet by mouth. Reported on 10/26/2015     Liraglutide -Weight Management 18 MG/3ML Sopn  Commonly known as:  SAXENDA  Inject 0.6 mg into the skin daily.     meloxicam 7.5 MG tablet  Commonly known as:  MOBIC  Take 7.5 mg by mouth as needed. Reported on 07/21/2015     MENOPAUSE FORMULA Tabs  Take by mouth.     MYRBETRIQ 50 MG Tb24 tablet  Generic drug:  mirabegron ER  Take 50 mg by mouth daily. Reported on 10/26/2015     pantoprazole 40 MG tablet  Commonly known as:  PROTONIX  Take 40 mg by mouth daily.     Vitamin D (Cholecalciferol) 1000 units Tabs  Take by mouth.     vitamin E 400 UNIT capsule  Take 400 Units by mouth daily.        Allergies: No Known Allergies  Family History: Family History  Problem Relation Age of Onset  . Cancer Maternal Grandmother   . Diabetes Paternal Grandmother   . Kidney disease Neg Hx   . Bladder Cancer Neg Hx     Social History:  reports that she quit smoking about 7 years ago. Her smoking use included Cigarettes. She  has a 8.5 pack-year smoking history. She does not have any smokeless tobacco history on file. She reports that she does not drink alcohol or use illicit drugs.  ROS: Urological Symptom Review  Patient is experiencing the following symptoms: Frequent urination Hard to postpone urination   Review of Systems  Gastrointestinal (upper)  : Negative for upper GI symptoms  Gastrointestinal (lower) : Negative for lower GI symptoms  Constitutional : Negative for symptoms  Skin: Negative for skin symptoms  Eyes: Negative for eye symptoms  Ear/Nose/Throat : Negative for Ear/Nose/Throat symptoms  Hematologic/Lymphatic: Negative for Hematologic/Lymphatic symptoms  Cardiovascular : Negative for cardiovascular symptoms  Respiratory : Negative for respiratory symptoms  Endocrine: Negative for endocrine symptoms  Musculoskeletal: Negative for musculoskeletal symptoms  Neurological: Negative for neurological symptoms  Psychologic: Negative for psychiatric symptoms   Physical Exam: Blood pressure 125/72, pulse 71, height 5\' 6"  (1.676 m), weight 283 lb (128.368 kg). GU:  Normal external genitalia.  Normal urethral meatus. No urethral masses and/or tenderness. No bladder fullness or masses. No vaginal lesions or discharge. Normal rectal tone, no masses. Normal anus and perineum.   Laboratory Data: Urinalysis Results for orders placed or performed in visit on 10/26/15  Microscopic Examination  Result Value Ref Range   WBC, UA 0-5 0 -  5 /hpf   RBC, UA 0-2 0 -  2 /hpf   Epithelial Cells (non renal) 0-10 0 - 10 /hpf   Mucus, UA Present (A) Not Estab.   Bacteria, UA Few (A) None seen/Few  Urinalysis, Complete  Result Value Ref Range   Specific Gravity, UA 1.025 1.005 - 1.030   pH, UA 5.5 5.0 - 7.5   Color, UA Yellow Yellow   Appearance Ur Clear Clear   Leukocytes, UA Negative Negative   Protein, UA Negative Negative/Trace   Glucose, UA Negative Negative   Ketones, UA  Negative Negative   RBC, UA Negative Negative   Bilirubin, UA Negative Negative   Urobilinogen, Ur 0.2 0.2 - 1.0 mg/dL   Nitrite, UA Negative Negative   Microscopic Examination See below:     Procedure:   Patient is placed in stirrups and her urethral meatus and vulva are cleansed with Betadine.  2% Lidocaine jelly was inserted into her urethra.  I then dilated her with Elby Showers sounds to a 45f without difficultly.  She tolerated the procedure well.  She will return in 4 months.  Assessment & Plan:    1. 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  Her UA was negative for microscopic hematuria.  She will follow up in four months time for next dilation.  Patient is headed to the [facility] for a vacation. I have given her prescription for amoxicillin to have on hand in case she should develop an UTI while on vacation.    - Urinalysis, Complete  2. History of right ureter stones:   Patient status post ESWL and right ureteroscopic 4 right ureteral stones. Recent renal ultrasound did not note any nephrolithiasis or hydronephrosis.  KUB did not demonstrate any residual fragments.   Her UA is negative for microscopic hematuria.    3. Left renal stone:   We'll obtain a KUB on a yearly basis. (March 2018)   Return in about 4 months (around 02/26/2016) for urethral dilation.  Zara Council, [facility] [address], [place], [place] [address] [phone]

## 2015-10-29 DIAGNOSIS — IMO0002 Reserved for concepts with insufficient information to code with codable children: Secondary | ICD-10-CM | POA: Insufficient documentation

## 2015-10-29 DIAGNOSIS — N200 Calculus of kidney: Secondary | ICD-10-CM | POA: Insufficient documentation

## 2015-10-29 DIAGNOSIS — Z87442 Personal history of urinary calculi: Secondary | ICD-10-CM | POA: Insufficient documentation

## 2015-11-02 ENCOUNTER — Other Ambulatory Visit: Payer: Self-pay

## 2015-11-02 DIAGNOSIS — N390 Urinary tract infection, site not specified: Secondary | ICD-10-CM

## 2015-11-02 MED ORDER — AMOXICILLIN 875 MG PO TABS: 875.0000 mg | ORAL_TABLET | Freq: Two times a day (BID) | ORAL | Status: DC

## 2015-11-04 ENCOUNTER — Other Ambulatory Visit: Payer: Self-pay | Admitting: Internal Medicine

## 2015-11-04 ENCOUNTER — Encounter: Payer: Self-pay | Admitting: Physician Assistant

## 2015-11-04 ENCOUNTER — Ambulatory Visit: Payer: Self-pay | Admitting: Physician Assistant

## 2015-11-04 DIAGNOSIS — H65192 Other acute nonsuppurative otitis media, left ear: Secondary | ICD-10-CM

## 2015-11-04 DIAGNOSIS — J040 Acute laryngitis: Secondary | ICD-10-CM

## 2015-11-04 MED ORDER — AMOXICILLIN 875 MG PO TABS: 875.0000 mg | ORAL_TABLET | Freq: Two times a day (BID) | ORAL | Status: DC

## 2015-11-04 MED ORDER — FEXOFENADINE-PSEUDOEPHED ER 60-120 MG PO TB12: 1.0000 | ORAL_TABLET | Freq: Two times a day (BID) | ORAL | Status: DC

## 2015-11-04 MED ORDER — FLUCONAZOLE 150 MG PO TABS: 150.0000 mg | ORAL_TABLET | Freq: Once | ORAL | Status: DC

## 2015-11-04 NOTE — Telephone Encounter (Signed)
Refilled on 05/2015. Patient last seen on 05/2015. Please advise?

## 2015-11-04 NOTE — Progress Notes (Signed)
   Subjective:    Patient ID: Latoya Lutz, female    DOB: 1970/11/02, 45 y.o.   MRN: [id number]  HPI Patient c/o ear pain, sinus congestion and sore throat. Patient states decreased voice volume since last night. States intermitting fever. Denies N/V/D. Currently taking Allergra.   Review of Systems    Obesity and GERD Objective:   Physical Exam Left edematous/erythematous TM. Bilateral maxillary guarding. Post nasal drainage in erythmatous pharyngitis. Decreased voice volume. Neck supple without adenopathy. Lungs CTA and Heart RRR.       Assessment & Plan:Otitis media/Laryngitis  TAke Amoxil, Allergra-D, Muscinex, and Diflucan as directed. Advised voice rest.  Follow up 3 days if no improvement.

## 2015-11-08 ENCOUNTER — Telehealth: Payer: Self-pay | Admitting: Physician Assistant

## 2015-11-08 ENCOUNTER — Telehealth: Payer: Self-pay

## 2015-11-08 NOTE — Telephone Encounter (Signed)
His note says he put her on amoxil, is she taking that?

## 2015-11-08 NOTE — Telephone Encounter (Signed)
PA started for continuation of Saxenda. Thanks

## 2015-11-09 ENCOUNTER — Telehealth: Payer: Self-pay | Admitting: *Deleted

## 2015-11-09 NOTE — Telephone Encounter (Signed)
Patient will need a prior authorization for [place]

## 2015-11-09 NOTE — Telephone Encounter (Signed)
It is in process already, thanks

## 2015-11-14 ENCOUNTER — Encounter: Payer: Self-pay | Admitting: Internal Medicine

## 2015-11-14 ENCOUNTER — Ambulatory Visit (INDEPENDENT_AMBULATORY_CARE_PROVIDER_SITE_OTHER): Payer: 59 | Admitting: Internal Medicine

## 2015-11-14 VITALS — BP 130/68 | HR 72 | Ht 66.0 in | Wt 284.8 lb

## 2015-11-14 DIAGNOSIS — R1310 Dysphagia, unspecified: Secondary | ICD-10-CM | POA: Diagnosis not present

## 2015-11-14 DIAGNOSIS — K219 Gastro-esophageal reflux disease without esophagitis: Secondary | ICD-10-CM

## 2015-11-14 MED ORDER — PANTOPRAZOLE SODIUM 40 MG PO TBEC: 40.0000 mg | DELAYED_RELEASE_TABLET | Freq: Every day | ORAL | Status: DC

## 2015-11-14 MED ORDER — LIRAGLUTIDE -WEIGHT MANAGEMENT 18 MG/3ML ~~LOC~~ SOPN: 0.6000 mg | PEN_INJECTOR | Freq: Every day | SUBCUTANEOUS | Status: DC

## 2015-11-14 MED ORDER — MELOXICAM 7.5 MG PO TABS: 7.5000 mg | ORAL_TABLET | ORAL | Status: DC | PRN

## 2015-11-14 NOTE — Patient Instructions (Signed)
We will set up evaluation with GI and barium swallow.  Continue Saxenda 3mg  daily.  Continue Protonix.  Follow up in 4 weeks.

## 2015-11-14 NOTE — Progress Notes (Signed)
Subjective:    Patient ID: Latoya Lutz, female    DOB: 19-Jan-1971, 45 y.o.   MRN: [id number]  HPI 45YO female presents for acute visit.  Dysphagia - having trouble swallowing at times, even on things like a small tablet. Has to hold throat out to get meds to pass through. Not taking Nexium.  Obesity - started back on Saxenda May 1st. Now taking 3mg  daily. Has lost about 5lbs. Notes decrease in appetite. Continues to have sweetened beverages in small amounts.  Wt Readings from Last 3 Encounters:  11/14/15 284 lb 12.8 oz (129.184 kg)  10/26/15 283 lb (128.368 kg)  08/25/15 287 lb (130.182 kg)   BP Readings from Last 3 Encounters:  11/14/15 130/68  10/26/15 125/72  09/02/15 179/88    Past Medical History  Diagnosis Date  . Heart murmur   . Chicken pox   . Kidney stones   . Endometriosis     Dr. Rushie Chestnut at [facility], removed   . Cervical spine disease   . Chronic urethral narrowing     undergoing stretching  . H/O cystoscopy     normal  . Urinary retention   . Acid reflux   . Arthritis   . Skin cancer   . Complication of anesthesia   . PONV (postoperative nausea and vomiting)   . Esophageal reflux 05/25/2014  . Obesity, morbid, BMI 40.0-49.9 ([facility]) 04/10/2012  . Candidal dermatitis 05/25/2014  . Gross hematuria 04/25/2015  . Infection of urinary tract 09/11/2014  . Kidney stone 09/11/2014  . Microscopic hematuria 09/11/2014  . Right ureteral stone 04/28/2015   Family History  Problem Relation Age of Onset  . Cancer Maternal Grandmother   . Diabetes Paternal Grandmother   . Kidney disease Neg Hx   . Bladder Cancer Neg Hx    Past Surgical History  Procedure Laterality Date  . Cholecystectomy    . Bladder surgery  07/2003  . Cesarean section  1998  . Abdominal hysterectomy      [facility] complete  . Tonsillectomy and adenoidectomy    . Knee surgery  2005  . Foot surgery  2003  . Carpal tunnel release Right   . Cystoscopy with stent placement Right 04/29/2015   Procedure: CYSTOSCOPY WITH STENT PLACEMENT;  Surgeon: Nickie Retort, MD;  Location: [facility] OR[facility];  Service: Urology;  Laterality: Right;  . Ureteroscopy with holmium laser lithotripsy Right 05/16/2015    Procedure: URETEROSCOPY WITH HOLMIUM LASER LITHOTRIPSY;  Surgeon: Nickie Retort, MD;  Location: [facility];  Service: Urology;  Laterality: Right;  . Cystoscopy w/ ureteral stent placement Right 05/16/2015    Procedure: CYSTOSCOPY WITH STENT REPLACEMENT;  Surgeon: Nickie Retort, MD;  Location: [facility];  Service: Urology;  Laterality: Right;  . Cystoscopy w/ retrogrades Right 05/16/2015    Procedure: CYSTOSCOPY WITH RETROGRADE PYELOGRAM;  Surgeon: Nickie Retort, MD;  Location: [facility];  Service: Urology;  Laterality: Right;  . Extracorporeal shock wave lithotripsy Left 06/02/2015    Procedure: EXTRACORPOREAL SHOCK WAVE LITHOTRIPSY (ESWL);  Surgeon: Hollice Espy, MD;  Location: [facility] ORS;  Service: Urology;  Laterality: Left;   Social History   Social History  . Marital Status: Married    Spouse Name: N/A  . Number of Children: N/A  . Years of Education: N/A   Social History Main Topics  . Smoking status: Former Smoker -- 0.50 packs/day for 17 years    Types: Cigarettes    Quit date: 04/27/2008  . Smokeless tobacco: None  Comment: quit 7 years   . Alcohol Use: No  . Drug Use: No  . Sexual Activity: Not Asked   Other Topics Concern  . None   Social History Narrative   Lives in [place] with son 15YO and fiance      Work - [employer]    Review of Systems  Constitutional: Negative for fever, chills, appetite change, fatigue and unexpected weight change.  HENT: Positive for trouble swallowing and voice change. Negative for rhinorrhea, sinus pressure, sneezing and sore throat.   Eyes: Negative for visual disturbance.  Respiratory: Negative for shortness of breath.   Cardiovascular: Negative for chest pain and leg swelling.  Gastrointestinal: Positive for nausea. Negative  for vomiting, abdominal pain, diarrhea, constipation and abdominal distention.  Skin: Negative for color change and rash.  Hematological: Negative for adenopathy. Does not bruise/bleed easily.  Psychiatric/Behavioral: Negative for sleep disturbance and dysphoric mood. The patient is not nervous/anxious.        Objective:    BP 130/68 mmHg  Pulse 72  Ht 5\' 6"  (1.676 m)  Wt 284 lb 12.8 oz (129.184 kg)  BMI 45.99 kg/m2  SpO2 97% Physical Exam  Constitutional: She is oriented to person, place, and time. She appears well-developed and well-nourished. No distress.  HENT:  Head: Normocephalic and atraumatic.  Right Ear: External ear normal.  Left Ear: External ear normal.  Nose: Nose normal.  Mouth/Throat: Oropharynx is clear and moist. No oropharyngeal exudate.  Eyes: Conjunctivae are normal. Pupils are equal, round, and reactive to light. Right eye exhibits no discharge. Left eye exhibits no discharge. No scleral icterus.  Neck: Normal range of motion. Neck supple. No tracheal deviation present. No thyromegaly present.  Cardiovascular: Normal rate, regular rhythm, normal heart sounds and intact distal pulses.  Exam reveals no gallop and no friction rub.   No murmur heard. Pulmonary/Chest: Effort normal and breath sounds normal. No respiratory distress. She has no wheezes. She has no rales. She exhibits no tenderness.  Musculoskeletal: Normal range of motion. She exhibits no edema or tenderness.  Lymphadenopathy:    She has no cervical adenopathy.  Neurological: She is alert and oriented to person, place, and time. No cranial nerve deficit. She exhibits normal muscle tone. Coordination normal.  Skin: Skin is warm and dry. No rash noted. She is not diaphoretic. No erythema. No pallor.  Psychiatric: She has a normal mood and affect. Her behavior is normal. Judgment and thought content normal.          Assessment & Plan:   Problem List Items Addressed This Visit      Unprioritized     Dysphagia - Primary    Recent dysphagia with small solid items. Will set up GI evaluation for possible EGD and will schedule barium swallow. Continue Pantoprazole. Follow up in 4 weeks.      Relevant Orders   Ambulatory referral to Gastroenterology   DG Esophagus   Esophageal reflux    Discussed how Saxenda can worsen reflux. Will continue Pantoprazole. Will also set up GI evaluation for possible upper endoscopy.      Relevant Medications   pantoprazole (PROTONIX) 40 MG tablet   Obesity, morbid, BMI 40.0-49.9 ([facility])    Wt Readings from Last 3 Encounters:  11/14/15 284 lb 12.8 oz (129.184 kg)  10/26/15 283 lb (128.368 kg)  08/25/15 287 lb (130.182 kg)   She has started Saxenda, now at 3mg  daily dosing. Will continue. Pilar Plate discussion about how continued intake of sweet tea is counterproductive  to weight loss.      Relevant Medications   Liraglutide -Weight Management (SAXENDA) 18 MG/3ML SOPN       Return in about 4 weeks (around 12/12/2015) for Recheck.  Ronette Deter, MD [facility]

## 2015-11-14 NOTE — Assessment & Plan Note (Signed)
Recent dysphagia with small solid items. Will set up GI evaluation for possible EGD and will schedule barium swallow. Continue Pantoprazole. Follow up in 4 weeks.

## 2015-11-14 NOTE — Assessment & Plan Note (Signed)
Discussed how Saxenda can worsen reflux. Will continue Pantoprazole. Will also set up GI evaluation for possible upper endoscopy.

## 2015-11-14 NOTE — Assessment & Plan Note (Signed)
Wt Readings from Last 3 Encounters:  11/14/15 284 lb 12.8 oz (129.184 kg)  10/26/15 283 lb (128.368 kg)  08/25/15 287 lb (130.182 kg)   She has started Saxenda, now at 3mg  daily dosing. Will continue. Pilar Plate discussion about how continued intake of sweet tea is counterproductive to weight loss.

## 2015-11-15 ENCOUNTER — Encounter: Payer: Self-pay | Admitting: Gastroenterology

## 2015-11-18 DIAGNOSIS — J029 Acute pharyngitis, unspecified: Secondary | ICD-10-CM | POA: Diagnosis not present

## 2015-11-18 DIAGNOSIS — J039 Acute tonsillitis, unspecified: Secondary | ICD-10-CM | POA: Diagnosis not present

## 2015-11-18 DIAGNOSIS — R070 Pain in throat: Secondary | ICD-10-CM | POA: Diagnosis not present

## 2015-11-18 NOTE — Telephone Encounter (Signed)
Resent to cover my meds. Awaiting response. Thanks

## 2015-11-24 DIAGNOSIS — H6980 Other specified disorders of Eustachian tube, unspecified ear: Secondary | ICD-10-CM | POA: Diagnosis not present

## 2015-11-24 DIAGNOSIS — J029 Acute pharyngitis, unspecified: Secondary | ICD-10-CM | POA: Diagnosis not present

## 2015-11-24 DIAGNOSIS — J3502 Chronic adenoiditis: Secondary | ICD-10-CM | POA: Diagnosis not present

## 2015-11-24 NOTE — Telephone Encounter (Signed)
saxenda approved from 11/18/2015-11/17/2016. Thanks

## 2015-12-15 ENCOUNTER — Other Ambulatory Visit: Payer: Self-pay | Admitting: Family Medicine

## 2015-12-15 ENCOUNTER — Telehealth: Payer: Self-pay | Admitting: Family Medicine

## 2015-12-15 DIAGNOSIS — Z1239 Encounter for other screening for malignant neoplasm of breast: Secondary | ICD-10-CM

## 2015-12-15 NOTE — Telephone Encounter (Signed)
Pt called stating she needs a order for [place] due to not seeing Dr Caryl Bis before. Call pt once order is put in. Thank you!  Call pt @ [phone]. Thank you!

## 2015-12-16 NOTE — Telephone Encounter (Signed)
Order placed

## 2015-12-16 NOTE — Telephone Encounter (Signed)
Thanks. Called patient and notified. Patient was grateful.

## 2015-12-28 ENCOUNTER — Ambulatory Visit: Payer: 59 | Attending: Internal Medicine

## 2016-01-18 ENCOUNTER — Ambulatory Visit: Payer: Self-pay | Admitting: Gastroenterology

## 2016-02-03 ENCOUNTER — Ambulatory Visit: Payer: Self-pay | Admitting: Physician Assistant

## 2016-02-03 VITALS — BP 148/86 | HR 84 | Temp 98.6°F

## 2016-02-03 DIAGNOSIS — H6980 Other specified disorders of Eustachian tube, unspecified ear: Secondary | ICD-10-CM

## 2016-02-03 DIAGNOSIS — J018 Other acute sinusitis: Secondary | ICD-10-CM

## 2016-02-03 MED ORDER — PREDNISONE 10 MG PO TABS: 30.0000 mg | ORAL_TABLET | Freq: Every day | ORAL | 0 refills | Status: DC

## 2016-02-03 MED ORDER — FLUTICASONE PROPIONATE 50 MCG/ACT NA SUSP: 2.0000 | Freq: Every day | NASAL | 6 refills | Status: DC

## 2016-02-03 NOTE — Progress Notes (Signed)
S:  C/o ears popping and being stopped up, pressure and aching,  no drainage from ears, no fever/chills, no cough or congestion, some sinus pressure, remainder ros neg Using otc meds without relief  O:  Vitals wnl, nad, tms dull b/l, nasal mucosa swollen, throat wnl, neck supple no lymph, lungs c t a, cv rrr, neuro intact  A: acute eustachean tube dysfunction  P: flonase, sudafed, prednisone 30mg  qd x 3d, return if not improving in 3 to 5 days, return earlier if worsening, f/u with ENT if needed

## 2016-02-27 ENCOUNTER — Ambulatory Visit: Payer: 59 | Admitting: Urology

## 2016-02-29 ENCOUNTER — Ambulatory Visit: Payer: 59 | Admitting: Urology

## 2016-02-29 ENCOUNTER — Other Ambulatory Visit: Payer: Self-pay | Admitting: Urology

## 2016-02-29 ENCOUNTER — Telehealth: Payer: Self-pay | Admitting: Urology

## 2016-02-29 MED ORDER — FLUCONAZOLE 150 MG PO TABS: 150.0000 mg | ORAL_TABLET | Freq: Once | ORAL | 0 refills | Status: AC

## 2016-02-29 NOTE — Telephone Encounter (Signed)
Pt called and wants to know if you will call in a couple of Diflucan for her.  If you need to talk to her, she's at [facility].

## 2016-02-29 NOTE — Telephone Encounter (Signed)
I've sent a prescription to [facility] pharmacy for her Diflucan.

## 2016-03-01 NOTE — Telephone Encounter (Signed)
Spoke with pt and made aware medication was sent to pharmacy. Pt voiced understanding.

## 2016-03-05 ENCOUNTER — Encounter: Payer: Self-pay | Admitting: Family Medicine

## 2016-03-05 ENCOUNTER — Ambulatory Visit (INDEPENDENT_AMBULATORY_CARE_PROVIDER_SITE_OTHER): Payer: 59 | Admitting: Family Medicine

## 2016-03-05 VITALS — BP 137/82 | HR 68 | Temp 98.5°F | Ht 63.7 in | Wt 285.0 lb

## 2016-03-05 DIAGNOSIS — R030 Elevated blood-pressure reading, without diagnosis of hypertension: Secondary | ICD-10-CM | POA: Diagnosis not present

## 2016-03-05 DIAGNOSIS — F419 Anxiety disorder, unspecified: Secondary | ICD-10-CM | POA: Diagnosis not present

## 2016-03-05 DIAGNOSIS — Z1239 Encounter for other screening for malignant neoplasm of breast: Secondary | ICD-10-CM | POA: Diagnosis not present

## 2016-03-05 DIAGNOSIS — K219 Gastro-esophageal reflux disease without esophagitis: Secondary | ICD-10-CM

## 2016-03-05 DIAGNOSIS — Z800 Family history of malignant neoplasm of digestive organs: Secondary | ICD-10-CM | POA: Diagnosis not present

## 2016-03-05 DIAGNOSIS — IMO0001 Reserved for inherently not codable concepts without codable children: Secondary | ICD-10-CM

## 2016-03-05 MED ORDER — SIMETHICONE 180 MG PO CAPS: 1.0000 | ORAL_CAPSULE | Freq: Three times a day (TID) | ORAL | 6 refills | Status: DC

## 2016-03-05 MED ORDER — BUSPIRONE HCL 10 MG PO TABS: 10.0000 mg | ORAL_TABLET | Freq: Three times a day (TID) | ORAL | 0 refills | Status: DC

## 2016-03-05 NOTE — Addendum Note (Signed)
Addended by: Valerie Roys on: 03/05/2016 03:28 PM   Modules accepted: Orders

## 2016-03-05 NOTE — Patient Instructions (Addendum)
DASH Eating Plan DASH stands for "Dietary Approaches to Stop Hypertension." The DASH eating plan is a healthy eating plan that has been shown to reduce high blood pressure (hypertension). Additional health benefits may include reducing the risk of type 2 diabetes mellitus, heart disease, and stroke. The DASH eating plan may also help with weight loss. WHAT DO I NEED TO KNOW ABOUT THE DASH EATING PLAN? For the DASH eating plan, you will follow these general guidelines:  Choose foods with a percent daily value for sodium of less than 5% (as listed on the food label).  Use salt-free seasonings or herbs instead of table salt or sea salt.  Check with your health care provider or pharmacist before using salt substitutes.  Eat lower-sodium products, often labeled as "lower sodium" or "no salt added."  Eat fresh foods.  Eat more vegetables, fruits, and low-fat dairy products.  Choose whole grains. Look for the word "whole" as the first word in the ingredient list.  Choose fish and skinless chicken or turkey more often than red meat. Limit fish, poultry, and meat to 6 oz (170 g) each day.  Limit sweets, desserts, sugars, and sugary drinks.  Choose heart-healthy fats.  Limit cheese to 1 oz (28 g) per day.  Eat more home-cooked food and less restaurant, buffet, and fast food.  Limit fried foods.  Cook foods using methods other than frying.  Limit canned vegetables. If you do use them, rinse them well to decrease the sodium.  When eating at a restaurant, ask that your food be prepared with less salt, or no salt if possible. WHAT FOODS CAN I EAT? Seek help from a dietitian for individual calorie needs. Grains Whole grain or whole wheat bread. Brown rice. Whole grain or whole wheat pasta. Quinoa, bulgur, and whole grain cereals. Low-sodium cereals. Corn or whole wheat flour tortillas. Whole grain cornbread. Whole grain crackers. Low-sodium crackers. Vegetables Fresh or frozen vegetables  (raw, steamed, roasted, or grilled). Low-sodium or reduced-sodium tomato and vegetable juices. Low-sodium or reduced-sodium tomato sauce and paste. Low-sodium or reduced-sodium canned vegetables.  Fruits All fresh, canned (in natural juice), or frozen fruits. Meat and Other Protein Products Ground beef (85% or leaner), grass-fed beef, or beef trimmed of fat. Skinless chicken or turkey. Ground chicken or turkey. Pork trimmed of fat. All fish and seafood. Eggs. Dried beans, peas, or lentils. Unsalted nuts and seeds. Unsalted canned beans. Dairy Low-fat dairy products, such as skim or 1% milk, 2% or reduced-fat cheeses, low-fat ricotta or cottage cheese, or plain low-fat yogurt. Low-sodium or reduced-sodium cheeses. Fats and Oils Tub margarines without trans fats. Light or reduced-fat mayonnaise and salad dressings (reduced sodium). Avocado. Safflower, olive, or canola oils. Natural peanut or almond butter. Other Unsalted popcorn and pretzels. The items listed above may not be a complete list of recommended foods or beverages. Contact your dietitian for more options. WHAT FOODS ARE NOT RECOMMENDED? Grains White bread. White pasta. White rice. Refined cornbread. Bagels and croissants. Crackers that contain trans fat. Vegetables Creamed or fried vegetables. Vegetables in a cheese sauce. Regular canned vegetables. Regular canned tomato sauce and paste. Regular tomato and vegetable juices. Fruits Dried fruits. Canned fruit in light or heavy syrup. Fruit juice. Meat and Other Protein Products Fatty cuts of meat. Ribs, chicken wings, bacon, sausage, bologna, salami, chitterlings, fatback, hot dogs, bratwurst, and packaged luncheon meats. Salted nuts and seeds. Canned beans with salt. Dairy Whole or 2% milk, cream, half-and-half, and cream cheese. Whole-fat or sweetened yogurt. Full-fat   cheeses or blue cheese. Nondairy creamers and whipped toppings. Processed cheese, cheese spreads, or cheese  curds. Condiments Onion and garlic salt, seasoned salt, table salt, and sea salt. Canned and packaged gravies. Worcestershire sauce. Tartar sauce. Barbecue sauce. Teriyaki sauce. Soy sauce, including reduced sodium. Steak sauce. Fish sauce. Oyster sauce. Cocktail sauce. Horseradish. Ketchup and mustard. Meat flavorings and tenderizers. Bouillon cubes. Hot sauce. Tabasco sauce. Marinades. Taco seasonings. Relishes. Fats and Oils Butter, stick margarine, lard, shortening, ghee, and bacon fat. Coconut, palm kernel, or palm oils. Regular salad dressings. Other Pickles and olives. Salted popcorn and pretzels. The items listed above may not be a complete list of foods and beverages to avoid. Contact your dietitian for more information. WHERE CAN I FIND MORE INFORMATION? National Heart, Lung, and Blood Institute: travelstabloid.com   This information is not intended to replace advice given to you by your health care provider. Make sure you discuss any questions you have with your health care provider.   Document Released: 05/17/2011 Document Revised: 06/18/2014 Document Reviewed: 04/01/2013 Elsevier Interactive Patient Education 2016 [place] for Gastroesophageal Reflux Disease, Adult When you have gastroesophageal reflux disease (GERD), the foods you eat and your eating habits are very important. Choosing the right foods can help ease the discomfort of GERD. WHAT GENERAL GUIDELINES DO I NEED TO FOLLOW?  Choose fruits, vegetables, whole grains, low-fat dairy products, and low-fat meat, fish, and poultry.  Limit fats such as oils, salad dressings, butter, nuts, and avocado.  Keep a food diary to identify foods that cause symptoms.  Avoid foods that cause reflux. These may be different for different people.  Eat frequent small meals instead of three large meals each day.  Eat your meals slowly, in a relaxed setting.  Limit fried foods.  Cook  foods using methods other than frying.  Avoid drinking alcohol.  Avoid drinking large amounts of liquids with your meals.  Avoid bending over or lying down until 2-3 hours after eating. WHAT FOODS ARE NOT RECOMMENDED? The following are some foods and drinks that may worsen your symptoms: Vegetables Tomatoes. Tomato juice. Tomato and spaghetti sauce. Chili peppers. Onion and garlic. Horseradish. Fruits Oranges, grapefruit, and lemon (fruit and juice). Meats High-fat meats, fish, and poultry. This includes hot dogs, ribs, ham, sausage, salami, and bacon. Dairy Whole milk and chocolate milk. Sour cream. Cream. Butter. Ice cream. Cream cheese.  Beverages Coffee and tea, with or without caffeine. Carbonated beverages or energy drinks. Condiments Hot sauce. Barbecue sauce.  Sweets/Desserts Chocolate and cocoa. Donuts. Peppermint and spearmint. Fats and Oils High-fat foods, including [place] fries and potato chips. Other Vinegar. Strong spices, such as black pepper, white pepper, red pepper, cayenne, curry powder, cloves, ginger, and chili powder. The items listed above may not be a complete list of foods and beverages to avoid. Contact your dietitian for more information.   This information is not intended to replace advice given to you by your health care provider. Make sure you discuss any questions you have with your health care provider.   Document Released: 05/28/2005 Document Revised: 06/18/2014 Document Reviewed: 04/01/2013 Elsevier Interactive Patient Education [employer].

## 2016-03-05 NOTE — Assessment & Plan Note (Signed)
Seems to be having more gas issues than heartburn. Continue current regimen. Will start silmethicone for gas. Call if not getting better.

## 2016-03-05 NOTE — Assessment & Plan Note (Signed)
Will start buspar for car rides. Call if not helping.

## 2016-03-05 NOTE — Assessment & Plan Note (Signed)
Better on recheck. Will work on [employer] and recheck at physical.

## 2016-03-05 NOTE — Progress Notes (Addendum)
BP 137/82 (BP Location: Left Arm, Cuff Size: Large)   Pulse 68   Temp 98.5 F (36.9 C)   Ht 5' 3.7" (1.618 m)   Wt 285 lb (129.3 kg)   SpO2 97%   BMI 49.38 kg/m    Subjective:    Patient ID: Latoya Lutz, female    DOB: Dec 31, 1970, 45 y.o.   MRN: [id number]  HPI: Latoya Lutz is a 45 y.o. female  Chief Complaint  Patient presents with  . Establish Care  . Hypertension  . Gastroesophageal Reflux    Patient has been on Protonix for about 6 years, she is starting to have symptoms again  . Colon Cancer Screening    Patients maternal grandmother dies from colon cancer, she would like to know what the recommendations are for screening with a relative that passed from it   ELEVATED BLOOD PRESSURE Duration of elevated BP: 1-2 months BP monitoring frequency: not checking Previous BP meds: no Recent stressors: no Family history of hypertension: yes Recurrent headaches: no- more of a pounding in her head Visual changes: yes Palpitations: no  Dyspnea: no Chest pain: no Lower extremity edema: no Dizzy/lightheaded: yes- with going from sitting to standing Transient ischemic attacks: no  GERD- had a hard time swallowing, has some swollen adnoids, going to have the surgery first of the year, has not seen GI because of that, did not have her acid reflux medicine changes. Has been on the protonix for abotu 5 years. For the past few months has been having a lot of burping GERD control status: exacerbated  Satisfied with current treatment? no Heartburn frequency: rare Medication side effects: no  Medication compliance: excellent Previous GERD medications: nothing Antacid use frequency: tums- about every 2-3x a week  Duration: chronic (about 6 months)  Nature: gassy Location: throughout her belly into her back Heartburn duration: no heart burn Alleviatiating factors:  nothing Aggravating factors: food Dysphagia: no Odynophagia:  no Hematemesis: no Blood in stool:  no EGD: no   Becomes very anxious when she is riding in the car.   Active Ambulatory Problems    Diagnosis Date Noted  . Obesity, morbid, BMI 40.0-49.9 ([place]) 04/10/2012  . Routine general medical examination at a health care facility 04/14/2013  . Esophageal reflux 05/25/2014  . Candidal dermatitis 05/25/2014  . Urethral stricture 09/11/2014  . Urinary retention 09/11/2014  . Microscopic hematuria 09/11/2014  . Kidney stone 09/11/2014  . Incomplete bladder emptying 09/11/2014  . 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 Gross hematuria 04/25/2015  . Right ureteral stone 04/28/2015  . History of urethral narrowing 07/16/2015  . 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 History of urinary stone 10/29/2015  . Dysphagia 11/14/2015  . Elevated BP 03/05/2016  . Acute anxiety 03/05/2016   Resolved Ambulatory Problems    Diagnosis Date Noted  . Urinary frequency 04/10/2012  . Muscle cramps 04/10/2012  . Bilateral hand pain 04/10/2012  . Urinary frequency 09/11/2014  . Infection of urinary tract 09/11/2014  . Kidney stones 10/29/2015   Past Medical History:  Diagnosis Date  . Acid reflux   . Arthritis   . Candidal dermatitis 05/25/2014  . Cervical spine disease   . Chicken pox   . Chronic urethral narrowing   . Complication of anesthesia   . Endometriosis   . Esophageal reflux 05/25/2014  . Gross hematuria 04/25/2015  . H/O cystoscopy   . Heart murmur   . Infection of urinary tract 09/11/2014  . Kidney stone 09/11/2014  .  Kidney stones   . Microscopic hematuria 09/11/2014  . Obesity, morbid, BMI 40.0-49.9 ([place]) 04/10/2012  . PONV (postoperative nausea and vomiting)   . Right ureteral stone 04/28/2015  . Skin cancer   . Urinary retention    No Known Allergies Current Outpatient Prescriptions on File Prior to Visit  Medication Sig Dispense Refill  . Calcium Carbonate (CALCIUM 600 PO) Take 1 tablet by mouth daily.     Marland Kitchen Fexofenadine HCl (ALLEGRA PO) Take 1 tablet by mouth every  evening. Reported on 11/04/2015    . fluticasone (FLONASE) 50 MCG/ACT nasal spray Place 2 sprays into both nostrils daily. As needed. 16 g 6  . L-ARGININE PO Take 1 tablet by mouth. Reported on 11/04/2015    . meloxicam (MOBIC) 7.5 MG tablet Take 1 tablet (7.5 mg total) by mouth as needed. Reported on 11/04/2015 30 tablet 2  . Nutritional Supplements (MENOPAUSE FORMULA) TABS Take by mouth. Reported on 11/04/2015    . pantoprazole (PROTONIX) 40 MG tablet Take 1 tablet (40 mg total) by mouth daily. 30 tablet 3  . predniSONE (DELTASONE) 10 MG tablet Take 3 tablets (30 mg total) by mouth daily with breakfast. 9 tablet 0  . Vitamin D, Cholecalciferol, 1000 UNITS TABS Take by mouth.    . vitamin E 400 UNIT capsule Take 400 Units by mouth daily.     No current facility-administered medications on file prior to visit.    Past Surgical History:  Procedure Laterality Date  . ABDOMINAL HYSTERECTOMY     [facility] complete  . BLADDER SURGERY  07/2003  . CARPAL TUNNEL RELEASE Right   . CESAREAN SECTION  1998  . CHOLECYSTECTOMY    . CYSTOSCOPY W/ RETROGRADES Right 05/16/2015   Procedure: CYSTOSCOPY WITH RETROGRADE PYELOGRAM;  Surgeon: Nickie Retort, MD;  Location: [facility];  Service: Urology;  Laterality: Right;  . CYSTOSCOPY W/ URETERAL STENT PLACEMENT Right 05/16/2015   Procedure: CYSTOSCOPY WITH STENT REPLACEMENT;  Surgeon: Nickie Retort, MD;  Location: [facility];  Service: Urology;  Laterality: Right;  . CYSTOSCOPY WITH STENT PLACEMENT Right 04/29/2015   Procedure: CYSTOSCOPY WITH STENT PLACEMENT;  Surgeon: Nickie Retort, MD;  Location: [facility];  Service: Urology;  Laterality: Right;  . EXTRACORPOREAL SHOCK WAVE LITHOTRIPSY Left 06/02/2015   Procedure: EXTRACORPOREAL SHOCK WAVE LITHOTRIPSY (ESWL);  Surgeon: Hollice Espy, MD;  Location: [facility];  Service: Urology;  Laterality: Left;  . FOOT SURGERY  2003  . KNEE SURGERY  2005  . TONSILLECTOMY    . URETEROSCOPY WITH HOLMIUM LASER LITHOTRIPSY  Right 05/16/2015   Procedure: URETEROSCOPY WITH HOLMIUM LASER LITHOTRIPSY;  Surgeon: Nickie Retort, MD;  Location: [facility];  Service: Urology;  Laterality: Right;   Social History   Social History  . Marital status: Married    Spouse name: N/A  . Number of children: N/A  . Years of education: N/A   Occupational History  . Not on file.   Social History Main Topics  . Smoking status: Former Smoker    Packs/day: 0.50    Years: 17.00    Types: Cigarettes    Quit date: 04/27/2008  . Smokeless tobacco: Never Used     Comment: quit 7 years   . Alcohol use No  . Drug use: No  . Sexual activity: Yes   Other Topics Concern  . Not on file   Social History Narrative   Lives in [place] with son 15YO and fiance      Work - [employer]  Family History  Problem Relation Age of Onset  . Heart disease Mother     Pacemaker and defib  . Hypertension Mother   . Hyperlipidemia Mother   . Cancer Father   . COPD Father     Lung and brain cancer  . Cancer Maternal Grandmother     Colon  . Diabetes Paternal Grandmother   . Cirrhosis Paternal Grandmother   . Cancer Paternal Grandmother   . Kidney disease Neg Hx   . Bladder Cancer Neg Hx     Review of Systems  Constitutional: Negative.   Respiratory: Negative.   Cardiovascular: Negative.   Gastrointestinal: Positive for abdominal distention. Negative for abdominal pain, anal bleeding, blood in stool, constipation, diarrhea, nausea, rectal pain and vomiting.  Psychiatric/Behavioral: Positive for agitation. Negative for behavioral problems, confusion, decreased concentration, dysphoric mood, hallucinations, self-injury, sleep disturbance and suicidal ideas. The patient is nervous/anxious. The patient is not hyperactive.     Per HPI unless specifically indicated above     Objective:    BP 137/82 (BP Location: Left Arm, Cuff Size: Large)   Pulse 68   Temp 98.5 F (36.9 C)   Ht 5' 3.7" (1.618 m)   Wt 285 lb (129.3 kg)   SpO2 97%    BMI 49.38 kg/m   Wt Readings from Last 3 Encounters:  03/05/16 285 lb (129.3 kg)  11/14/15 284 lb 12.8 oz (129.2 kg)  10/26/15 283 lb (128.4 kg)    Physical Exam  Constitutional: She is oriented to person, place, and time. She appears well-developed and well-nourished. No distress.  HENT:  Head: Normocephalic and atraumatic.  Right Ear: Hearing normal.  Left Ear: Hearing normal.  Nose: Nose normal.  Eyes: Conjunctivae and lids are normal. Right eye exhibits no discharge. Left eye exhibits no discharge. No scleral icterus.  Cardiovascular: Normal rate, regular rhythm, normal heart sounds and intact distal pulses.  Exam reveals no gallop and no friction rub.   No murmur heard. Pulmonary/Chest: Effort normal and breath sounds normal. No respiratory distress. She has no wheezes. She has no rales. She exhibits no tenderness.  Musculoskeletal: Normal range of motion.  Neurological: She is alert and oriented to person, place, and time.  Skin: Skin is warm, dry and intact. No rash noted. She is not diaphoretic. No erythema. No pallor.  Psychiatric: She has a normal mood and affect. Her speech is normal and behavior is normal. Judgment and thought content normal. Cognition and memory are normal.  Nursing note and vitals reviewed.   Results for orders placed or performed in visit on 10/26/15  Microscopic Examination  Result Value Ref Range   WBC, UA 0-5 0 - 5 /hpf   RBC, UA 0-2 0 - 2 /hpf   Epithelial Cells (non renal) 0-10 0 - 10 /hpf   Mucus, UA Present (A) Not Estab.   Bacteria, UA Few (A) None seen/Few  Urinalysis, Complete  Result Value Ref Range   Specific Gravity, UA 1.025 1.005 - 1.030   pH, UA 5.5 5.0 - 7.5   Color, UA Yellow Yellow   Appearance Ur Clear Clear   Leukocytes, UA Negative Negative   Protein, UA Negative Negative/Trace   Glucose, UA Negative Negative   Ketones, UA Negative Negative   RBC, UA Negative Negative   Bilirubin, UA Negative Negative   Urobilinogen,  Ur 0.2 0.2 - 1.0 mg/dL   Nitrite, UA Negative Negative   Microscopic Examination See below:       Assessment & Plan:  Problem List Items Addressed This Visit      Digestive   Esophageal reflux    Seems to be having more gas issues than heartburn. Continue current regimen. Will start silmethicone for gas. Call if not getting better.       Relevant Medications   Simethicone 180 MG CAPS     Other   Elevated BP - Primary    Better on recheck. Will work on [employer] and recheck at physical.      Acute anxiety    Will start buspar for car rides. Call if not helping.      Relevant Medications   busPIRone (BUSPAR) 10 MG tablet    Other Visit Diagnoses    Family history of colon cancer       Will get her into see GI. Not due for screening colonoscopy, but anxious, so will refer.    Relevant Orders   Ambulatory referral to General Surgery   Screening for breast cancer       Mammogram ordered today.   Relevant Orders   MM DIGITAL SCREENING BILATERAL       Follow up plan: Return Around the first of the year , for Physical, Records Release.

## 2016-03-06 ENCOUNTER — Telehealth: Payer: Self-pay

## 2016-03-06 ENCOUNTER — Other Ambulatory Visit: Payer: Self-pay

## 2016-03-06 NOTE — Telephone Encounter (Signed)
Gastroenterology Pre-Procedure Review  Request Date: 05/15/2016 Requesting Physician: Dr. Wynetta Emery  PATIENT REVIEW QUESTIONS: The patient responded to the following health history questions as indicated:    1. Are you having any GI issues? no 2. Do you have a personal history of Polyps? no 3. Do you have a family history of Colon Cancer or Polyps? yes (maternal grandmother colon cancer) 4. Diabetes Mellitus? no 5. Joint replacements in the past 12 months?no 6. Major health problems in the past 3 months?no 7. Any artificial heart valves, MVP, or defibrillator?no    MEDICATIONS & ALLERGIES:    Patient reports the following regarding taking any anticoagulation/antiplatelet therapy:   Plavix, Coumadin, Eliquis, Xarelto, Lovenox, Pradaxa, Brilinta, or Effient? no Aspirin? no  Patient confirms/reports the following medications:  Current Outpatient Prescriptions  Medication Sig Dispense Refill  . amoxicillin (AMOXIL) 875 MG tablet Take 875 mg by mouth 2 (two) times daily.    . busPIRone (BUSPAR) 10 MG tablet Take 1 tablet (10 mg total) by mouth 3 (three) times daily. 90 tablet 0  . Calcium Carbonate (CALCIUM 600 PO) Take 1 tablet by mouth daily.     Marland Kitchen dextromethorphan-guaiFENesin (MUCINEX DM) 30-600 MG 12hr tablet Take 1 tablet by mouth 2 (two) times daily.    . fluticasone (FLONASE) 50 MCG/ACT nasal spray   6  . meloxicam (MOBIC) 7.5 MG tablet Take 1 tablet (7.5 mg total) by mouth as needed. Reported on 11/04/2015 30 tablet 2  . Nutritional Supplements (MENOPAUSE FORMULA) TABS Take by mouth. Reported on 11/04/2015    . pantoprazole (PROTONIX) 40 MG tablet Take 1 tablet (40 mg total) by mouth daily. 30 tablet 3  . pseudoephedrine (SUDAFED) 30 MG tablet Take 30 mg by mouth 3 (three) times daily.    . Simethicone 180 MG CAPS Take 1 capsule (180 mg total) by mouth 3 (three) times daily with meals. 90 capsule 6  . Vitamin D, Cholecalciferol, 1000 UNITS TABS Take by mouth.    . vitamin E 400 UNIT  capsule Take 400 Units by mouth daily.     No current facility-administered medications for this visit.     Patient confirms/reports the following allergies:  No Known Allergies  No orders of the defined types were placed in this encounter.   AUTHORIZATION INFORMATION Primary Insurance: 1D#: Group #:  Secondary Insurance: 1D#: Group #:  SCHEDULE INFORMATION: Date: 05/15/2016  Time: Location: [facility]

## 2016-03-06 NOTE — Telephone Encounter (Signed)
Colonoscopy Z80.0 Family Hx of colon cancer [facility] [id number] UMR Pre cert is not required

## 2016-03-06 NOTE — Telephone Encounter (Signed)
Packet has been mailed out

## 2016-03-15 ENCOUNTER — Ambulatory Visit: Payer: 59 | Admitting: Urology

## 2016-03-20 ENCOUNTER — Encounter: Payer: Self-pay | Admitting: Urology

## 2016-03-20 ENCOUNTER — Ambulatory Visit (INDEPENDENT_AMBULATORY_CARE_PROVIDER_SITE_OTHER): Payer: 59 | Admitting: Urology

## 2016-03-20 VITALS — BP 135/77 | HR 60 | Ht 63.7 in | Wt 288.6 lb

## 2016-03-20 DIAGNOSIS — N359 Urethral stricture, unspecified: Secondary | ICD-10-CM | POA: Diagnosis not present

## 2016-03-20 DIAGNOSIS — IMO0002 Reserved for concepts with insufficient information to code with codable children: Secondary | ICD-10-CM

## 2016-03-20 MED ORDER — FLUCONAZOLE 150 MG PO TABS: 150.0000 mg | ORAL_TABLET | Freq: Once | ORAL | 0 refills | Status: AC

## 2016-03-20 MED ORDER — NITROFURANTOIN MONOHYD MACRO 100 MG PO CAPS
100.0000 mg | ORAL_CAPSULE | Freq: Two times a day (BID) | ORAL | 0 refills | Status: DC
Start: 2016-03-20 — End: 2016-08-08

## 2016-03-21 LAB — URINALYSIS, COMPLETE
Bilirubin, UA: NEGATIVE
Glucose, UA: NEGATIVE
Ketones, UA: NEGATIVE
Leukocytes, UA: NEGATIVE
NITRITE UA: NEGATIVE
PH UA: 5 (ref 5.0–7.5)
Protein, UA: NEGATIVE
Specific Gravity, UA: 1.015 (ref 1.005–1.030)
UUROB: 0.2 mg/dL (ref 0.2–1.0)

## 2016-03-21 LAB — MICROSCOPIC EXAMINATION
Bacteria, UA: NONE SEEN
WBC UA: NONE SEEN /HPF (ref 0–?)

## 2016-03-22 ENCOUNTER — Ambulatory Visit: Payer: 59 | Admitting: Urology

## 2016-03-26 ENCOUNTER — Other Ambulatory Visit: Payer: Self-pay | Admitting: Family Medicine

## 2016-03-26 ENCOUNTER — Telehealth: Payer: Self-pay | Admitting: Family Medicine

## 2016-03-26 DIAGNOSIS — K219 Gastro-esophageal reflux disease without esophagitis: Secondary | ICD-10-CM

## 2016-03-26 MED ORDER — MELOXICAM 7.5 MG PO TABS: 7.5000 mg | ORAL_TABLET | ORAL | 3 refills | Status: DC | PRN

## 2016-03-26 MED ORDER — PANTOPRAZOLE SODIUM 40 MG PO TBEC: 40.0000 mg | DELAYED_RELEASE_TABLET | Freq: Every day | ORAL | 3 refills | Status: DC

## 2016-03-26 NOTE — Telephone Encounter (Signed)
Rx sent to her pharmacy 

## 2016-03-29 NOTE — Progress Notes (Signed)
5:05 PM   Latoya Lutz [facility] April 11, 1971 [id number]  Referring provider: Jackolyn Confer, MD No address on file  Chief Complaint  Patient presents with  . Follow-up    Uretheral Stenosis    HPI: Patient  is a 45 y/o Caucasian female with a h/o urethral stricture who presents today for a urethral dilation.   Background history She was referred to [place] in 2013 for increased frequency, increased urgency and feelings of not emptying her bladder by Dr. Ronette Deter, M.D.  Per patient, she was diagnosed by Dr. Madelin Headings with urethral dilation and had been receiving serial dilations every 4 to 6 months since 2013.  After she has a dilation, her symptoms of frequency, increased urgency and feelings of not emptying her bladder abate for 3 to 6 months.    She also has a history of right ureteral stones treated with ESWL and right URS/LL/right ureteral stent placement and stent removal in winter 2016.  A recent renal ultrasound completed on 07/13/2015 did not note any hydronephrosis or nephrolithiasis. KUB On 08/22/2015 noted probable interval passage of distal left ureteral calculi noted on prior KUB and a question of a small left renal calculus.    Today, patient presents requesting urethral dilatation. She is experiencing increasing frequent urination and nocturia.  She does state that it has become more severe over the last few days.  Her UA is unremarkable.  She is not experiencing any gross hematuria, dysuria or suprapubic pain. She has not had any flank pain. She is on any fevers, chills, nausea or vomiting.   PMH: Past Medical History:  Diagnosis Date  . Acid reflux   . Arthritis   . Candidal dermatitis 05/25/2014  . Cervical spine disease   . Chicken pox   . Chronic urethral narrowing    undergoing stretching  . Complication of anesthesia   . Endometriosis    Dr. Rushie Chestnut at [facility], removed   . Esophageal reflux 05/25/2014  . Gross hematuria 04/25/2015  . H/O cystoscopy    normal  . Heart murmur   . Infection of urinary tract 09/11/2014  . Kidney stone 09/11/2014  . Kidney stones   . Microscopic hematuria 09/11/2014  . Obesity, morbid, BMI 40.0-49.9 ([place]) 04/10/2012  . PONV (postoperative nausea and vomiting)   . Right ureteral stone 04/28/2015  . Skin cancer   . Urinary retention     Surgical History: Past Surgical History:  Procedure Laterality Date  . ABDOMINAL HYSTERECTOMY     [facility] complete  . BLADDER SURGERY  07/2003  . CARPAL TUNNEL RELEASE Right   . CESAREAN SECTION  1998  . CHOLECYSTECTOMY    . CYSTOSCOPY W/ RETROGRADES Right 05/16/2015   Procedure: CYSTOSCOPY WITH RETROGRADE PYELOGRAM;  Surgeon: Nickie Retort, MD;  Location: [facility] ORS;  Service: Urology;  Laterality: Right;  . CYSTOSCOPY W/ URETERAL STENT PLACEMENT Right 05/16/2015   Procedure: CYSTOSCOPY WITH STENT REPLACEMENT;  Surgeon: Nickie Retort, MD;  Location: [facility] OR[facility];  Service: Urology;  Laterality: Right;  . CYSTOSCOPY WITH STENT PLACEMENT Right 04/29/2015   Procedure: CYSTOSCOPY WITH STENT PLACEMENT;  Surgeon: Nickie Retort, MD;  Location: [facility] OR[facility];  Service: Urology;  Laterality: Right;  . EXTRACORPOREAL SHOCK WAVE LITHOTRIPSY Left 06/02/2015   Procedure: EXTRACORPOREAL SHOCK WAVE LITHOTRIPSY (ESWL);  Surgeon: Hollice Espy, MD;  Location: [facility] ORS;  Service: Urology;  Laterality: Left;  . FOOT SURGERY  2003  . KNEE SURGERY  2005  . TONSILLECTOMY    . URETEROSCOPY  WITH HOLMIUM LASER LITHOTRIPSY Right 05/16/2015   Procedure: URETEROSCOPY WITH HOLMIUM LASER LITHOTRIPSY;  Surgeon: Nickie Retort, MD;  Location: [facility] OR[facility];  Service: Urology;  Laterality: Right;    Home Medications:    Medication List       Accurate as of 03/20/16 11:59 PM. Always use your most recent med list.          amoxicillin 875 MG tablet Commonly known as:  AMOXIL Take 875 mg by mouth 2 (two) times daily.   busPIRone 10 MG tablet Commonly known as:  BUSPAR Take 1 tablet (10 mg total)  by mouth 3 (three) times daily.   CALCIUM 600 PO Take 1 tablet by mouth daily.   dextromethorphan-guaiFENesin 30-600 MG 12hr tablet Commonly known as:  MUCINEX DM Take 1 tablet by mouth 2 (two) times daily.   fluconazole 150 MG tablet Commonly known as:  DIFLUCAN Take 1 tablet (150 mg total) by mouth once.   fluticasone 50 MCG/ACT nasal spray Commonly known as:  FLONASE   meloxicam 7.5 MG tablet Commonly known as:  MOBIC Take 1 tablet (7.5 mg total) by mouth as needed. Reported on 11/04/2015   MENOPAUSE FORMULA Tabs Take by mouth. Reported on 11/04/2015   nitrofurantoin (macrocrystal-monohydrate) 100 MG capsule Commonly known as:  MACROBID Take 1 capsule (100 mg total) by mouth every 12 (twelve) hours.   pantoprazole 40 MG tablet Commonly known as:  PROTONIX Take 1 tablet (40 mg total) by mouth daily.   pseudoephedrine 30 MG tablet Commonly known as:  SUDAFED Take 30 mg by mouth 3 (three) times daily.   Simethicone 180 MG Caps Take 1 capsule (180 mg total) by mouth 3 (three) times daily with meals.   Vitamin D (Cholecalciferol) 1000 units Tabs Take by mouth.   vitamin E 400 UNIT capsule Take 400 Units by mouth daily.       Allergies: No Known Allergies  Family History: Family History  Problem Relation Age of Onset  . Heart disease Mother     Pacemaker and defib  . Hypertension Mother   . Hyperlipidemia Mother   . Cancer Father   . COPD Father     Lung and brain cancer  . Cancer Maternal Grandmother     Colon  . Diabetes Paternal Grandmother   . Cirrhosis Paternal Grandmother   . Cancer Paternal Grandmother   . Kidney disease Neg Hx   . Bladder Cancer Neg Hx     Social History:  reports that she quit smoking about 7 years ago. Her smoking use included Cigarettes. She has a 8.50 pack-year smoking history. She has never used smokeless tobacco. She reports that she does not drink alcohol or use drugs.  ROS: Urological Symptom Review UROLOGY Frequent  Urination?: Yes Hard to postpone urination?: No Burning/pain with urination?: No Get up at night to urinate?: Yes Leakage of urine?: No Urine stream starts and stops?: No Trouble starting stream?: No Do you have to strain to urinate?: No Blood in urine?: No Urinary tract infection?: No Sexually transmitted disease?: No Injury to kidneys or bladder?: No Painful intercourse?: No Weak stream?: No Currently pregnant?: No Vaginal bleeding?: No Last menstrual period?: n Gastrointestinal Nausea?: No Vomiting?: No Indigestion/heartburn?: No Diarrhea?: No Constipation?: No Constitutional Fever: No Night sweats?: No Weight loss?: No Fatigue?: No Skin Skin rash/lesions?: No Itching?: No Eyes Blurred vision?: No Double vision?: No Ears/Nose/Throat Sore throat?: No Sinus problems?: No Hematologic/Lymphatic Swollen glands?: No Easy bruising?: No Cardiovascular Leg swelling?: No Chest  pain?: No Respiratory Cough?: No Shortness of breath?: No Endocrine Excessive thirst?: No Musculoskeletal Back pain?: No Joint pain?: No Neurological Headaches?: No Dizziness?: No Psychologic Depression?: No Anxiety?: No   Physical Exam: Blood pressure 135/77, pulse 60, height 5' 3.7" (1.618 m), weight 288 lb 9 oz (130.9 kg).  GU:  Normal external genitalia.  Normal urethral meatus. No urethral masses and/or tenderness. No bladder fullness or masses. No vaginal lesions or discharge. Normal rectal tone, no masses. Normal anus and perineum.   Laboratory Data: Urinalysis Results for orders placed or performed in visit on 03/20/16  Microscopic Examination  Result Value Ref Range   WBC, UA None seen 0 - 5 /hpf   RBC, UA 0-2 0 - 2 /hpf   Epithelial Cells (non renal) 0-10 0 - 10 /hpf   Bacteria, UA None seen None seen/Few  Urinalysis, Complete  Result Value Ref Range   Specific Gravity, UA 1.015 1.005 - 1.030   pH, UA 5.0 5.0 - 7.5   Color, UA Yellow Yellow   Appearance Ur Clear  Clear   Leukocytes, UA Negative Negative   Protein, UA Negative Negative/Trace   Glucose, UA Negative Negative   Ketones, UA Negative Negative   RBC, UA Trace (A) Negative   Bilirubin, UA Negative Negative   Urobilinogen, Ur 0.2 0.2 - 1.0 mg/dL   Nitrite, UA Negative Negative   Microscopic Examination See below:     Procedure:   Patient is placed in stirrups and her urethral meatus and vulva are cleansed with Betadine.  2% Lidocaine jelly was inserted into her urethra.  I then dilated her with Elby Showers sounds to a 4f without difficultly.  She tolerated the procedure well.  She will return in 4 months.  Assessment & Plan:    1. 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  Her UA was negative for microscopic hematuria.  She will follow up in four months time for next dilation.  I have given her prescription for nitrofuratoin  to have on hand in case she should develop an UTI.    - Urinalysis, Complete  2. History of right ureter stones:   Patient status post ESWL and right ureteroscopic 4 right ureteral stones. Recent renal ultrasound did not note any nephrolithiasis or hydronephrosis.    Her UA is negative for microscopic hematuria.    3. Left renal stone:   We'll obtain a KUB on a yearly basis. (March 2018)   Return in about 4 months (around 07/21/2016) for urethral dilation.  Zara Council, [facility] [address], [place], [place] [address] [phone]

## 2016-04-10 ENCOUNTER — Encounter: Payer: Self-pay | Admitting: Family Medicine

## 2016-04-17 ENCOUNTER — Telehealth: Payer: Self-pay | Admitting: Gastroenterology

## 2016-04-17 ENCOUNTER — Telehealth: Payer: Self-pay | Admitting: Family Medicine

## 2016-04-17 MED ORDER — ACYCLOVIR 400 MG PO TABS: 400.0000 mg | ORAL_TABLET | Freq: Three times a day (TID) | ORAL | 0 refills | Status: DC

## 2016-04-17 NOTE — Telephone Encounter (Signed)
Called patient and LVM.

## 2016-04-17 NOTE — Telephone Encounter (Signed)
Patient called back to reschedule her Procedure. She states you can call her on her work number [phone]. Ext [phone]. Thank you

## 2016-04-17 NOTE — Telephone Encounter (Signed)
Patient needs to reschedule her procedure 

## 2016-04-17 NOTE — Telephone Encounter (Signed)
Called and LVM on her work number [phone] Ext [phone]

## 2016-04-17 NOTE — Telephone Encounter (Signed)
Rx sent to her pharmacy 

## 2016-04-18 NOTE — Telephone Encounter (Signed)
Patient called back again to reschedule her colonoscopy. Patient states you can call her at work. Her number is [phone] ext [phone]. Thank you

## 2016-04-19 NOTE — Telephone Encounter (Signed)
Rescheduled patient for 07/2016

## 2016-05-01 DIAGNOSIS — K219 Gastro-esophageal reflux disease without esophagitis: Secondary | ICD-10-CM | POA: Diagnosis not present

## 2016-05-01 DIAGNOSIS — J301 Allergic rhinitis due to pollen: Secondary | ICD-10-CM | POA: Diagnosis not present

## 2016-05-01 DIAGNOSIS — H6980 Other specified disorders of Eustachian tube, unspecified ear: Secondary | ICD-10-CM | POA: Diagnosis not present

## 2016-05-08 ENCOUNTER — Ambulatory Visit
Admission: RE | Admit: 2016-05-08 | Discharge: 2016-05-08 | Disposition: A | Discharge status: Home / Self Care | Payer: 59 | Source: Ambulatory Visit | Attending: Otolaryngology | Admitting: Otolaryngology

## 2016-05-08 ENCOUNTER — Other Ambulatory Visit: Payer: Self-pay | Admitting: Otolaryngology

## 2016-05-08 DIAGNOSIS — J3502 Chronic adenoiditis: Secondary | ICD-10-CM

## 2016-05-08 DIAGNOSIS — J351 Hypertrophy of tonsils: Secondary | ICD-10-CM | POA: Diagnosis not present

## 2016-05-08 DIAGNOSIS — R0981 Nasal congestion: Secondary | ICD-10-CM | POA: Diagnosis not present

## 2016-05-09 DIAGNOSIS — J029 Acute pharyngitis, unspecified: Secondary | ICD-10-CM | POA: Diagnosis not present

## 2016-05-15 ENCOUNTER — Ambulatory Visit: Admit: 2016-05-15 | Payer: 59 | Admitting: Gastroenterology

## 2016-05-15 SURGERY — COLONOSCOPY WITH PROPOFOL
Anesthesia: General

## 2016-05-16 ENCOUNTER — Ambulatory Visit: Admit: 2016-05-16 | Payer: 59 | Admitting: Otolaryngology

## 2016-05-16 SURGERY — ADENOIDECTOMY
Anesthesia: General | Laterality: Bilateral

## 2016-05-25 DIAGNOSIS — J029 Acute pharyngitis, unspecified: Secondary | ICD-10-CM | POA: Diagnosis not present

## 2016-05-25 DIAGNOSIS — J351 Hypertrophy of tonsils: Secondary | ICD-10-CM | POA: Diagnosis not present

## 2016-05-28 ENCOUNTER — Other Ambulatory Visit: Payer: Self-pay | Admitting: Family Medicine

## 2016-05-31 ENCOUNTER — Ambulatory Visit: Payer: 59 | Attending: Otolaryngology

## 2016-05-31 DIAGNOSIS — G4733 Obstructive sleep apnea (adult) (pediatric): Secondary | ICD-10-CM | POA: Diagnosis not present

## 2016-05-31 DIAGNOSIS — Z6841 Body Mass Index (BMI) 40.0 and over, adult: Secondary | ICD-10-CM | POA: Diagnosis not present

## 2016-06-05 ENCOUNTER — Telehealth: Payer: Self-pay | Admitting: Otolaryngology

## 2016-06-05 NOTE — Telephone Encounter (Signed)
Delayed note from 06/02/16 - Patient reports a long history of lingual tonsillitis and has been evaluated by flexible laryngoscopy on multiple occasions. She has an upcoming surgery at [facility]. She is having a flair of her "typical" soreness and throat fullness. She would like steroids because these have helped her very much in the past. We discussed the risks and benefits of short term steroid use and she agreed to proceed. Rx placed by phone for prednisone taper.

## 2016-06-06 ENCOUNTER — Telehealth: Payer: Self-pay | Admitting: Gastroenterology

## 2016-06-06 NOTE — Telephone Encounter (Signed)
Patient need to cancel her colonoscopy due to another surgery. She will call us back to reschedule

## 2016-06-07 NOTE — Telephone Encounter (Signed)
Left vm letting pt know colonoscopy has been cancelled.

## 2016-06-18 ENCOUNTER — Encounter: Payer: Self-pay | Admitting: Internal Medicine

## 2016-06-18 ENCOUNTER — Encounter: Payer: Self-pay | Admitting: Family Medicine

## 2016-06-18 ENCOUNTER — Ambulatory Visit: Payer: 59

## 2016-06-19 ENCOUNTER — Ambulatory Visit: Payer: 59

## 2016-06-19 ENCOUNTER — Encounter: Payer: 59 | Admitting: Family Medicine

## 2016-06-22 DIAGNOSIS — J351 Hypertrophy of tonsils: Secondary | ICD-10-CM | POA: Diagnosis not present

## 2016-06-22 DIAGNOSIS — J039 Acute tonsillitis, unspecified: Secondary | ICD-10-CM | POA: Diagnosis not present

## 2016-06-22 DIAGNOSIS — R1310 Dysphagia, unspecified: Secondary | ICD-10-CM | POA: Diagnosis not present

## 2016-07-17 ENCOUNTER — Other Ambulatory Visit: Payer: Self-pay | Admitting: Family Medicine

## 2016-07-17 ENCOUNTER — Ambulatory Visit
Admission: RE | Admit: 2016-07-17 | Discharge: 2016-07-17 | Disposition: A | Discharge status: Home / Self Care | Payer: 59 | Source: Ambulatory Visit | Attending: Family Medicine | Admitting: Family Medicine

## 2016-07-17 ENCOUNTER — Encounter: Payer: 59 | Admitting: Family Medicine

## 2016-07-17 DIAGNOSIS — Z1231 Encounter for screening mammogram for malignant neoplasm of breast: Secondary | ICD-10-CM | POA: Diagnosis not present

## 2016-07-17 DIAGNOSIS — Z1239 Encounter for other screening for malignant neoplasm of breast: Secondary | ICD-10-CM

## 2016-07-20 DIAGNOSIS — J351 Hypertrophy of tonsils: Secondary | ICD-10-CM | POA: Diagnosis not present

## 2016-07-20 DIAGNOSIS — Z01818 Encounter for other preprocedural examination: Secondary | ICD-10-CM | POA: Diagnosis not present

## 2016-07-23 DIAGNOSIS — R112 Nausea with vomiting, unspecified: Secondary | ICD-10-CM | POA: Insufficient documentation

## 2016-07-23 DIAGNOSIS — J039 Acute tonsillitis, unspecified: Secondary | ICD-10-CM | POA: Diagnosis not present

## 2016-07-23 DIAGNOSIS — R011 Cardiac murmur, unspecified: Secondary | ICD-10-CM | POA: Diagnosis not present

## 2016-07-23 DIAGNOSIS — Z9889 Other specified postprocedural states: Secondary | ICD-10-CM

## 2016-07-23 DIAGNOSIS — D141 Benign neoplasm of larynx: Secondary | ICD-10-CM | POA: Diagnosis not present

## 2016-07-23 DIAGNOSIS — J3501 Chronic tonsillitis: Secondary | ICD-10-CM | POA: Diagnosis not present

## 2016-07-23 DIAGNOSIS — Z87891 Personal history of nicotine dependence: Secondary | ICD-10-CM | POA: Diagnosis not present

## 2016-07-23 DIAGNOSIS — R1310 Dysphagia, unspecified: Secondary | ICD-10-CM | POA: Diagnosis not present

## 2016-07-23 DIAGNOSIS — K219 Gastro-esophageal reflux disease without esophagitis: Secondary | ICD-10-CM | POA: Diagnosis not present

## 2016-07-23 DIAGNOSIS — Z87442 Personal history of urinary calculi: Secondary | ICD-10-CM | POA: Diagnosis not present

## 2016-07-24 DIAGNOSIS — K219 Gastro-esophageal reflux disease without esophagitis: Secondary | ICD-10-CM | POA: Diagnosis not present

## 2016-07-24 DIAGNOSIS — R112 Nausea with vomiting, unspecified: Secondary | ICD-10-CM | POA: Diagnosis not present

## 2016-07-24 DIAGNOSIS — Z87442 Personal history of urinary calculi: Secondary | ICD-10-CM | POA: Diagnosis not present

## 2016-07-24 DIAGNOSIS — R011 Cardiac murmur, unspecified: Secondary | ICD-10-CM | POA: Diagnosis not present

## 2016-07-24 DIAGNOSIS — J3501 Chronic tonsillitis: Secondary | ICD-10-CM | POA: Diagnosis not present

## 2016-07-24 DIAGNOSIS — Z87891 Personal history of nicotine dependence: Secondary | ICD-10-CM | POA: Diagnosis not present

## 2016-07-30 ENCOUNTER — Other Ambulatory Visit: Payer: Self-pay | Admitting: Family Medicine

## 2016-07-30 DIAGNOSIS — K219 Gastro-esophageal reflux disease without esophagitis: Secondary | ICD-10-CM

## 2016-07-31 ENCOUNTER — Ambulatory Visit: Admit: 2016-07-31 | Payer: 59 | Admitting: Gastroenterology

## 2016-07-31 SURGERY — COLONOSCOPY WITH PROPOFOL
Anesthesia: General

## 2016-08-03 ENCOUNTER — Encounter: Payer: 59 | Admitting: Family Medicine

## 2016-08-08 ENCOUNTER — Ambulatory Visit (INDEPENDENT_AMBULATORY_CARE_PROVIDER_SITE_OTHER): Payer: 59 | Admitting: Family Medicine

## 2016-08-08 ENCOUNTER — Encounter: Payer: Self-pay | Admitting: Family Medicine

## 2016-08-08 VITALS — BP 133/82 | HR 57 | Temp 97.4°F | Ht 64.0 in | Wt 277.0 lb

## 2016-08-08 DIAGNOSIS — F419 Anxiety disorder, unspecified: Secondary | ICD-10-CM

## 2016-08-08 DIAGNOSIS — K219 Gastro-esophageal reflux disease without esophagitis: Secondary | ICD-10-CM | POA: Diagnosis not present

## 2016-08-08 DIAGNOSIS — Z0000 Encounter for general adult medical examination without abnormal findings: Secondary | ICD-10-CM

## 2016-08-08 LAB — MICROSCOPIC EXAMINATION: RBC, UA: NONE SEEN /hpf (ref 0–?)

## 2016-08-08 LAB — UA/M W/RFLX CULTURE, ROUTINE
Bilirubin, UA: NEGATIVE
GLUCOSE, UA: NEGATIVE
KETONES UA: NEGATIVE
Leukocytes, UA: NEGATIVE
NITRITE UA: NEGATIVE
RBC, UA: NEGATIVE
UUROB: 0.2 mg/dL (ref 0.2–1.0)
pH, UA: 5.5 (ref 5.0–7.5)

## 2016-08-08 NOTE — Assessment & Plan Note (Signed)
Stable, continue current regimen of prn buspar

## 2016-08-08 NOTE — Progress Notes (Signed)
BP 133/82   Pulse (!) 57   Temp 97.4 F (36.3 C)   Ht 5\' 4"  (1.626 m)   Wt 277 lb (125.6 kg)   SpO2 99%   BMI 47.55 kg/m    Subjective:    Patient ID: Latoya Lutz, female    DOB: Apr 22, 1971, 46 y.o.   MRN: [id number]  HPI: Latoya Lutz is a 46 y.o. female presenting on 08/08/2016 for comprehensive medical examination. Current medical complaints include:none  Fasting for labs today.   Taking buspar only as needed, mostly when traveling.   Gets a cold sore once every few years, takes acyclovir with good resolution.  PRN GERD sxs, protonix works well.   Depression Screen done today and results listed below:  Depression screen [facility] 2/9 08/08/2016 03/05/2016 11/14/2015 06/01/2015  Decreased Interest 0 0 0 0  Down, Depressed, Hopeless 0 0 0 0  PHQ - 2 Score 0 0 0 0  Altered sleeping - 0 - -  Tired, decreased energy - 0 - -  Change in appetite - 0 - -  Feeling bad or failure about yourself  - 0 - -  Trouble concentrating - 0 - -  Moving slowly or fidgety/restless - 0 - -  Suicidal thoughts - 0 - -  PHQ-9 Score - 0 - -    The patient does not have a history of falls. I did not complete a risk assessment for falls. A plan of care for falls was not documented.   Past Medical History:  Past Medical History:  Diagnosis Date  . Acid reflux   . Arthritis   . Candidal dermatitis 05/25/2014  . Cervical spine disease   . Chicken pox   . Chronic urethral narrowing    undergoing stretching  . Complication of anesthesia   . Endometriosis    Dr. Rushie Chestnut at [facility], removed   . Esophageal reflux 05/25/2014  . Gross hematuria 04/25/2015  . H/O cystoscopy    normal  . Heart murmur   . Infection of urinary tract 09/11/2014  . Kidney stone 09/11/2014  . Kidney stones   . Microscopic hematuria 09/11/2014  . Obesity, morbid, BMI 40.0-49.9 ([place]) 04/10/2012  . PONV (postoperative nausea and vomiting)   . Right ureteral stone 04/28/2015  . Skin cancer   . Urinary retention      Surgical History:  Past Surgical History:  Procedure Laterality Date  . ABDOMINAL HYSTERECTOMY     [facility] complete  . BLADDER SURGERY  07/2003  . CARPAL TUNNEL RELEASE Right   . CESAREAN SECTION  1998  . CHOLECYSTECTOMY    . CYSTOSCOPY W/ RETROGRADES Right 05/16/2015   Procedure: CYSTOSCOPY WITH RETROGRADE PYELOGRAM;  Surgeon: Nickie Retort, MD;  Location: [facility] OR[facility];  Service: Urology;  Laterality: Right;  . CYSTOSCOPY W/ URETERAL STENT PLACEMENT Right 05/16/2015   Procedure: CYSTOSCOPY WITH STENT REPLACEMENT;  Surgeon: Nickie Retort, MD;  Location: [facility];  Service: Urology;  Laterality: Right;  . CYSTOSCOPY WITH STENT PLACEMENT Right 04/29/2015   Procedure: CYSTOSCOPY WITH STENT PLACEMENT;  Surgeon: Nickie Retort, MD;  Location: [facility];  Service: Urology;  Laterality: Right;  . EXTRACORPOREAL SHOCK WAVE LITHOTRIPSY Left 06/02/2015   Procedure: EXTRACORPOREAL SHOCK WAVE LITHOTRIPSY (ESWL);  Surgeon: Hollice Espy, MD;  Location: [facility] ORS;  Service: Urology;  Laterality: Left;  . FOOT SURGERY  2003  . KNEE SURGERY  2005  . TONSILLECTOMY    . URETEROSCOPY WITH HOLMIUM LASER LITHOTRIPSY Right 05/16/2015  Procedure: URETEROSCOPY WITH HOLMIUM LASER LITHOTRIPSY;  Surgeon: Nickie Retort, MD;  Location: [facility] OR[facility];  Service: Urology;  Laterality: Right;    Medications:  Current Outpatient Prescriptions on File Prior to Visit  Medication Sig  . acyclovir (ZOVIRAX) 400 MG tablet Take 1 tablet (400 mg total) by mouth 3 (three) times daily.  . busPIRone (BUSPAR) 10 MG tablet TAKE 1 TABLET BY MOUTH 3 TIMES DAILY  . Calcium Carbonate (CALCIUM 600 PO) Take 1 tablet by mouth daily.   Marland Kitchen dextromethorphan-guaiFENesin (MUCINEX DM) 30-600 MG 12hr tablet Take 1 tablet by mouth 2 (two) times daily.  . fluticasone (FLONASE) 50 MCG/ACT nasal spray   . meloxicam (MOBIC) 7.5 MG tablet Take 1 tablet (7.5 mg total) by mouth as needed. Reported on 11/04/2015  . Nutritional Supplements  (MENOPAUSE FORMULA) TABS Take by mouth. Reported on 11/04/2015  . pantoprazole (PROTONIX) 40 MG tablet TAKE 1 TABLET BY MOUTH DAILY.  Marland Kitchen pseudoephedrine (SUDAFED) 30 MG tablet Take 30 mg by mouth 3 (three) times daily.  . Simethicone 180 MG CAPS Take 1 capsule (180 mg total) by mouth 3 (three) times daily with meals.  . Vitamin D, Cholecalciferol, 1000 UNITS TABS Take by mouth.  . vitamin E 400 UNIT capsule Take 400 Units by mouth daily.   No current facility-administered medications on file prior to visit.     Allergies:  No Known Allergies  Social History:  Social History   Social History  . Marital status: Married    Spouse name: N/A  . Number of children: N/A  . Years of education: N/A   Occupational History  . Not on file.   Social History Main Topics  . Smoking status: Former Smoker    Packs/day: 0.50    Years: 17.00    Types: Cigarettes    Quit date: 04/27/2008  . Smokeless tobacco: Never Used     Comment: quit 7 years   . Alcohol use No  . Drug use: No  . Sexual activity: Yes   Other Topics Concern  . Not on file   Social History Narrative   Lives in [place] with son 15YO and fiance      Work - [employer]   History  Smoking Status  . Former Smoker  . Packs/day: 0.50  . Years: 17.00  . Types: Cigarettes  . Quit date: 04/27/2008  Smokeless Tobacco  . Never Used    Comment: quit 7 years    History  Alcohol Use No    Family History:  Family History  Problem Relation Age of Onset  . Heart disease Mother     Pacemaker and defib  . Hypertension Mother   . Hyperlipidemia Mother   . Cancer Father   . COPD Father     Lung and brain cancer  . Cancer Maternal Grandmother     Colon  . Diabetes Paternal Grandmother   . Cirrhosis Paternal Grandmother   . Cancer Paternal Grandmother   . Kidney disease Neg Hx   . Bladder Cancer Neg Hx   . Breast cancer Neg Hx     Past medical history, surgical history, medications, allergies, family history and social  history reviewed with patient today and changes made to appropriate areas of the chart.   Review of Systems - General ROS: negative Psychological ROS: negative Ophthalmic ROS: negative ENT ROS: recent tonsillectomy, recovering well Breast ROS: negative for breast lumps Respiratory ROS: no cough, shortness of breath, or wheezing Cardiovascular ROS: no chest pain or  dyspnea on exertion Gastrointestinal ROS: no abdominal pain, change in bowel habits, or black or bloody stools Genito-Urinary ROS: no dysuria, trouble voiding, or hematuria Musculoskeletal ROS: negative Neurological ROS: no TIA or stroke symptoms Dermatological ROS: negative All other ROS negative except what is listed above and in the HPI.      Objective:    BP 133/82   Pulse (!) 57   Temp 97.4 F (36.3 C)   Ht 5\' 4"  (1.626 m)   Wt 277 lb (125.6 kg)   SpO2 99%   BMI 47.55 kg/m   Wt Readings from Last 3 Encounters:  08/08/16 277 lb (125.6 kg)  03/20/16 288 lb 9 oz (130.9 kg)  03/05/16 285 lb (129.3 kg)    Physical Exam  Constitutional: She is oriented to person, place, and time. She appears well-developed and well-nourished. No distress.  HENT:  Head: Atraumatic.  Right Ear: External ear normal.  Left Ear: External ear normal.  Nose: Nose normal.  Mouth/Throat: Oropharynx is clear and moist. No oropharyngeal exudate.  Eyes: Conjunctivae are normal. Pupils are equal, round, and reactive to light. No scleral icterus.  Neck: Normal range of motion. Neck supple. No thyromegaly present.  Cardiovascular: Normal rate, regular rhythm, normal heart sounds and intact distal pulses.   Pulmonary/Chest: Right breast exhibits no mass and no tenderness. Left breast exhibits no mass and no tenderness.  Abdominal: Soft. Bowel sounds are normal. She exhibits no distension. There is no tenderness.  Genitourinary:  Genitourinary Comments: Deferred using shared decision making  Musculoskeletal: Normal range of motion.  Strength  intact  Lymphadenopathy:    She has no cervical adenopathy.    She has no axillary adenopathy.  Neurological: She is alert and oriented to person, place, and time. No cranial nerve deficit. Coordination normal.  Skin: Skin is warm and dry. No rash noted.  Psychiatric: She has a normal mood and affect. Her behavior is normal.      Assessment & Plan:   Problem List Items Addressed This Visit      Digestive   Esophageal reflux    Stable on prn protonix        Other   Acute anxiety    Stable, continue current regimen of prn buspar       Other Visit Diagnoses    Annual physical exam    -  Primary   UTD on vaccines, mammogram. Await fasting lab results   Relevant Orders   CBC with Differential/Platelet   Comprehensive metabolic panel   Lipid Panel w/o Chol/HDL Ratio   TSH   UA/M w/rflx Culture, Routine   HgB A1c       Follow up plan: Return in about 1 year (around 08/08/2017) for Physical Exam.   LABORATORY TESTING:  - Pap smear: not applicable  IMMUNIZATIONS:   - Tdap: Tetanus vaccination status reviewed: last tetanus booster within 10 years. - Influenza: Up to date  SCREENING: -Mammogram: Up to date   PATIENT COUNSELING:   Advised to take 1 mg of folate supplement per day if capable of pregnancy.   Sexuality: Discussed sexually transmitted diseases, partner selection, use of condoms, avoidance of unintended pregnancy  and contraceptive alternatives.   Advised to avoid cigarette smoking.  I discussed with the patient that most people either abstain from alcohol or drink within safe limits (<=14/week and <=4 drinks/occasion for males, <=7/weeks and <= 3 drinks/occasion for females) and that the risk for alcohol disorders and other health effects rises proportionally with the  number of drinks per week and how often a drinker exceeds daily limits.  Discussed cessation/primary prevention of drug use and availability of treatment for abuse.   Diet: Encouraged to  adjust caloric intake to maintain  or achieve ideal body weight, to reduce intake of dietary saturated fat and total fat, to limit sodium intake by avoiding high sodium foods and not adding table salt, and to maintain adequate dietary potassium and calcium preferably from fresh fruits, vegetables, and low-fat dairy products.    stressed the importance of regular exercise  Injury prevention: Discussed safety belts, safety helmets, smoke detector, smoking near bedding or upholstery.   Dental health: Discussed importance of regular tooth brushing, flossing, and dental visits.    NEXT PREVENTATIVE PHYSICAL DUE IN 1 YEAR. Return in about 1 year (around 08/08/2017) for Physical Exam.

## 2016-08-08 NOTE — Assessment & Plan Note (Signed)
Stable on prn protonix

## 2016-08-09 ENCOUNTER — Ambulatory Visit: Payer: 59 | Admitting: Urology

## 2016-08-09 ENCOUNTER — Ambulatory Visit (INDEPENDENT_AMBULATORY_CARE_PROVIDER_SITE_OTHER): Payer: 59 | Admitting: Urology

## 2016-08-09 ENCOUNTER — Encounter: Payer: Self-pay | Admitting: Urology

## 2016-08-09 VITALS — BP 141/84 | HR 60 | Ht 66.0 in | Wt 279.1 lb

## 2016-08-09 DIAGNOSIS — IMO0002 Reserved for concepts with insufficient information to code with codable children: Secondary | ICD-10-CM

## 2016-08-09 DIAGNOSIS — N359 Urethral stricture, unspecified: Secondary | ICD-10-CM

## 2016-08-09 DIAGNOSIS — Z87442 Personal history of urinary calculi: Secondary | ICD-10-CM

## 2016-08-09 LAB — CBC WITH DIFFERENTIAL/PLATELET
Basophils Absolute: 0 10*3/uL (ref 0.0–0.2)
Basos: 0 %
EOS (ABSOLUTE): 0.1 10*3/uL (ref 0.0–0.4)
EOS: 2 %
HEMATOCRIT: 36.7 % (ref 34.0–46.6)
HEMOGLOBIN: 12.1 g/dL (ref 11.1–15.9)
Immature Grans (Abs): 0 10*3/uL (ref 0.0–0.1)
Immature Granulocytes: 0 %
LYMPHS ABS: 2.8 10*3/uL (ref 0.7–3.1)
Lymphs: 46 %
MCH: 27.1 pg (ref 26.6–33.0)
MCHC: 33 g/dL (ref 31.5–35.7)
MCV: 82 fL (ref 79–97)
MONOCYTES: 5 %
MONOS ABS: 0.3 10*3/uL (ref 0.1–0.9)
NEUTROS ABS: 2.8 10*3/uL (ref 1.4–7.0)
Neutrophils: 47 %
Platelets: 264 10*3/uL (ref 150–379)
RBC: 4.46 x10E6/uL (ref 3.77–5.28)
RDW: 14.7 % (ref 12.3–15.4)
WBC: 6 10*3/uL (ref 3.4–10.8)

## 2016-08-09 LAB — HEMOGLOBIN A1C
ESTIMATED AVERAGE GLUCOSE: 117 mg/dL
Hgb A1c MFr Bld: 5.7 % — ABNORMAL HIGH (ref 4.8–5.6)

## 2016-08-09 LAB — MICROSCOPIC EXAMINATION: BACTERIA UA: NONE SEEN

## 2016-08-09 LAB — URINALYSIS, COMPLETE
BILIRUBIN UA: NEGATIVE
GLUCOSE, UA: NEGATIVE
Leukocytes, UA: NEGATIVE
Nitrite, UA: NEGATIVE
RBC UA: NEGATIVE
SPEC GRAV UA: 1.025 (ref 1.005–1.030)
Urobilinogen, Ur: 0.2 mg/dL (ref 0.2–1.0)
pH, UA: 5.5 (ref 5.0–7.5)

## 2016-08-09 LAB — COMPREHENSIVE METABOLIC PANEL
A/G RATIO: 1.8 (ref 1.2–2.2)
ALBUMIN: 4.4 g/dL (ref 3.5–5.5)
ALK PHOS: 85 IU/L (ref 39–117)
ALT: 12 IU/L (ref 0–32)
AST: 16 IU/L (ref 0–40)
BILIRUBIN TOTAL: 0.2 mg/dL (ref 0.0–1.2)
BUN / CREAT RATIO: 9 (ref 9–23)
BUN: 7 mg/dL (ref 6–24)
CHLORIDE: 102 mmol/L (ref 96–106)
CO2: 24 mmol/L (ref 18–29)
CREATININE: 0.76 mg/dL (ref 0.57–1.00)
Calcium: 9.1 mg/dL (ref 8.7–10.2)
GFR calc Af Amer: 109 mL/min/{1.73_m2} (ref >59)
GFR calc non Af Amer: 94 mL/min/{1.73_m2} (ref >59)
GLOBULIN, TOTAL: 2.4 g/dL (ref 1.5–4.5)
Glucose: 98 mg/dL (ref 65–99)
POTASSIUM: 3.8 mmol/L (ref 3.5–5.2)
SODIUM: 144 mmol/L (ref 134–144)
Total Protein: 6.8 g/dL (ref 6.0–8.5)

## 2016-08-09 LAB — LIPID PANEL W/O CHOL/HDL RATIO
CHOLESTEROL TOTAL: 215 mg/dL — AB (ref 100–199)
HDL: 45 mg/dL (ref >39)
LDL CALC: 129 mg/dL — AB (ref 0–99)
TRIGLYCERIDES: 203 mg/dL — AB (ref 0–149)
VLDL Cholesterol Cal: 41 mg/dL — ABNORMAL HIGH (ref 5–40)

## 2016-08-09 LAB — TSH: TSH: 1.2 u[IU]/mL (ref 0.450–4.500)

## 2016-08-09 NOTE — Progress Notes (Signed)
1:58 PM   Baldwin 1970-08-09 [id number]  Referring provider: Valerie Roys, DO [place], [place] [id number]  Chief Complaint  Patient presents with  . Follow-up    Urethral Stenosis last seen 03/20/16    HPI: Patient  is a 46 y/o Caucasian female with a h/o urethral stricture who presents today for a urethral dilation.   Background history She was referred to [place] in 2013 for increased frequency, increased urgency and feelings of not emptying her bladder by Dr. Ronette Deter, M.D.  Per patient, she was diagnosed by Dr. Madelin Headings with urethral dilation and had been receiving serial dilations every 4 to 6 months since 2013.  After she has a dilation, her symptoms of frequency, increased urgency and feelings of not emptying her bladder abate for 3 to 6 months.    She also has a history of right ureteral stones treated with ESWL and right URS/LL/right ureteral stent placement and stent removal in winter 2016.  A recent renal ultrasound completed on 07/13/2015 did not note any hydronephrosis or nephrolithiasis. KUB On 08/22/2015 noted probable interval passage of distal left ureteral calculi noted on prior KUB and a question of a small left renal calculus.  She has not had any flank pain or gross hematuria.  She is due for her yearly KUB this month.    Today, patient presents requesting urethral dilatation. She is experiencing increasing frequent urination and nocturia.  Her UA is unremarkable.  She is not experiencing any gross hematuria, dysuria or suprapubic pain. She has not had any flank pain. She is on any fevers, chills, nausea or vomiting.   PMH: Past Medical History:  Diagnosis Date  . Acid reflux   . Arthritis   . Candidal dermatitis 05/25/2014  . Cervical spine disease   . Chicken pox   . Chronic urethral narrowing    undergoing stretching  . Complication of anesthesia   . Endometriosis    Dr. Rushie Chestnut at [facility], removed   . Esophageal reflux 05/25/2014  .  Gross hematuria 04/25/2015  . H/O cystoscopy    normal  . Heart murmur   . Infection of urinary tract 09/11/2014  . Kidney stone 09/11/2014  . Kidney stones   . Microscopic hematuria 09/11/2014  . Obesity, morbid, BMI 40.0-49.9 ([place]) 04/10/2012  . PONV (postoperative nausea and vomiting)   . Right ureteral stone 04/28/2015  . Skin cancer   . Urinary retention     Surgical History: Past Surgical History:  Procedure Laterality Date  . ABDOMINAL HYSTERECTOMY     [facility] complete  . BLADDER SURGERY  07/2003  . CARPAL TUNNEL RELEASE Right   . CESAREAN SECTION  1998  . CHOLECYSTECTOMY    . CYSTOSCOPY W/ RETROGRADES Right 05/16/2015   Procedure: CYSTOSCOPY WITH RETROGRADE PYELOGRAM;  Surgeon: Nickie Retort, MD;  Location: [facility] OR[facility];  Service: Urology;  Laterality: Right;  . CYSTOSCOPY W/ URETERAL STENT PLACEMENT Right 05/16/2015   Procedure: CYSTOSCOPY WITH STENT REPLACEMENT;  Surgeon: Nickie Retort, MD;  Location: [facility];  Service: Urology;  Laterality: Right;  . CYSTOSCOPY WITH STENT PLACEMENT Right 04/29/2015   Procedure: CYSTOSCOPY WITH STENT PLACEMENT;  Surgeon: Nickie Retort, MD;  Location: [facility];  Service: Urology;  Laterality: Right;  . EXTRACORPOREAL SHOCK WAVE LITHOTRIPSY Left 06/02/2015   Procedure: EXTRACORPOREAL SHOCK WAVE LITHOTRIPSY (ESWL);  Surgeon: Hollice Espy, MD;  Location: [facility];  Service: Urology;  Laterality: Left;  . FOOT SURGERY  2003  .  KNEE SURGERY  2005  . TONSILLECTOMY    . TONSILLECTOMY     lingual growth on vocal cord removal  . URETEROSCOPY WITH HOLMIUM LASER LITHOTRIPSY Right 05/16/2015   Procedure: URETEROSCOPY WITH HOLMIUM LASER LITHOTRIPSY;  Surgeon: Nickie Retort, MD;  Location: [facility];  Service: Urology;  Laterality: Right;    Home Medications:  Allergies as of 08/09/2016   No Known Allergies     Medication List       Accurate as of 08/09/16  1:58 PM. Always use your most recent med list.          acyclovir 400 MG  tablet Commonly known as:  ZOVIRAX Take 1 tablet (400 mg total) by mouth 3 (three) times daily.   busPIRone 10 MG tablet Commonly known as:  BUSPAR TAKE 1 TABLET BY MOUTH 3 TIMES DAILY   CALCIUM 600 PO Take 1 tablet by mouth daily.   dextromethorphan-guaiFENesin 30-600 MG 12hr tablet Commonly known as:  MUCINEX DM Take 1 tablet by mouth 2 (two) times daily.   fluticasone 50 MCG/ACT nasal spray Commonly known as:  FLONASE   meloxicam 7.5 MG tablet Commonly known as:  MOBIC Take 1 tablet (7.5 mg total) by mouth as needed. Reported on 11/04/2015   MENOPAUSE FORMULA Tabs Take by mouth. Reported on 11/04/2015   pantoprazole 40 MG tablet Commonly known as:  PROTONIX TAKE 1 TABLET BY MOUTH DAILY.   pseudoephedrine 30 MG tablet Commonly known as:  SUDAFED Take 30 mg by mouth 3 (three) times daily.   Simethicone 180 MG Caps Take 1 capsule (180 mg total) by mouth 3 (three) times daily with meals.   Vitamin D (Cholecalciferol) 1000 units Tabs Take by mouth.   vitamin E 400 UNIT capsule Take 400 Units by mouth daily.       Allergies: No Known Allergies  Family History: Family History  Problem Relation Age of Onset  . Heart disease Mother     Pacemaker and defib  . Hypertension Mother   . Hyperlipidemia Mother   . Cancer Father   . COPD Father     Lung and brain cancer  . Cancer Maternal Grandmother     Colon  . Diabetes Paternal Grandmother   . Cirrhosis Paternal Grandmother   . Cancer Paternal Grandmother   . Kidney disease Neg Hx   . Bladder Cancer Neg Hx   . Breast cancer Neg Hx     Social History:  reports that she quit smoking about 8 years ago. Her smoking use included Cigarettes. She has a 8.50 pack-year smoking history. She has never used smokeless tobacco. She reports that she does not drink alcohol or use drugs.  ROS: Urological Symptom Review UROLOGY Frequent Urination?: No Hard to postpone urination?: No Burning/pain with urination?: No Get up  at night to urinate?: No Leakage of urine?: No Urine stream starts and stops?: No Trouble starting stream?: No Do you have to strain to urinate?: No Blood in urine?: No Urinary tract infection?: No Sexually transmitted disease?: No Injury to kidneys or bladder?: No Painful intercourse?: No Weak stream?: No Currently pregnant?: No Vaginal bleeding?: No Last menstrual period?: n Gastrointestinal Nausea?: No Vomiting?: No Indigestion/heartburn?: No Diarrhea?: No Constipation?: No Constitutional Fever: No Night sweats?: No Weight loss?: No Fatigue?: No Skin Skin rash/lesions?: No Itching?: No Eyes Blurred vision?: No Double vision?: No Ears/Nose/Throat Sore throat?: No Sinus problems?: No Hematologic/Lymphatic Swollen glands?: No Easy bruising?: No Cardiovascular Leg swelling?: No Chest pain?: No Respiratory Cough?:  No Shortness of breath?: No Endocrine Excessive thirst?: No Musculoskeletal Back pain?: No Joint pain?: No Neurological Headaches?: No Dizziness?: No Psychologic Depression?: No Anxiety?: No   Physical Exam: Blood pressure (!) 141/84, pulse 60, height 5\' 6"  (1.676 m), weight 279 lb 1.6 oz (126.6 kg). GU:  Normal external genitalia.  Normal urethral meatus. No urethral masses and/or tenderness. No bladder fullness or masses. No vaginal lesions or discharge. Normal rectal tone, no masses. Normal anus and perineum.   Laboratory Data: Urinalysis Unremarkable.  See EPIC.   Procedure:   Patient is placed in stirrups and her urethral meatus and vulva are cleansed with Betadine.  2% Lidocaine jelly was inserted into her urethra.  I then dilated her with Elby Showers sounds to a 9f without difficultly.  She tolerated the procedure well.  She will return in 4 months.  Assessment & Plan:    1. 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  Her UA was negative for microscopic hematuria.  She will follow up in four months time for  next dilation.     - Urinalysis, Complete  2. History of nephrolithiasis  Patient status post ESWL and right ureteroscopy for right ureteral stones. Recent renal ultrasound did not note any nephrolithiasis or hydronephrosis.    We'll obtain a KUB today.   Her UA is negative for microscopic hematuria.     Return in about 4 years (around 08/09/2020) for urethral dilation.  Zara Council, [facility] [address], [place], [place] [address] [phone]

## 2016-08-10 ENCOUNTER — Telehealth: Payer: Self-pay | Admitting: Family Medicine

## 2016-08-10 MED ORDER — ATORVASTATIN CALCIUM 10 MG PO TABS: 10.0000 mg | ORAL_TABLET | Freq: Every day | ORAL | 3 refills | Status: DC

## 2016-08-10 NOTE — Telephone Encounter (Signed)
Patient notified of lab results

## 2016-08-10 NOTE — Telephone Encounter (Signed)
Please let her know all of her labs came back normal but her cholesterol is still elevated so I would like her to start on low dose lipitor - have sent it to the pharmacy. Also, she is still in the "prediabetes" category but has improved slightly from last year. Keep up the lifestyle modifications to improve both.

## 2016-08-10 NOTE — Telephone Encounter (Signed)
Left message to call.

## 2016-08-14 ENCOUNTER — Encounter: Payer: Self-pay | Admitting: Family Medicine

## 2016-09-17 ENCOUNTER — Other Ambulatory Visit: Payer: Self-pay | Admitting: Family Medicine

## 2016-09-17 ENCOUNTER — Encounter: Payer: Self-pay | Admitting: Family Medicine

## 2016-09-17 ENCOUNTER — Encounter: Payer: Self-pay | Admitting: Urology

## 2016-09-17 MED ORDER — PRAVASTATIN SODIUM 20 MG PO TABS: 20.0000 mg | ORAL_TABLET | Freq: Every day | ORAL | 0 refills | Status: DC

## 2016-09-17 NOTE — Telephone Encounter (Signed)
Routing to provider  

## 2016-09-18 ENCOUNTER — Other Ambulatory Visit: Payer: Self-pay | Admitting: Urology

## 2016-09-18 MED ORDER — FLUCONAZOLE 150 MG PO TABS: 150.0000 mg | ORAL_TABLET | Freq: Once | ORAL | 0 refills | Status: AC

## 2016-09-18 MED ORDER — NITROFURANTOIN MONOHYD MACRO 100 MG PO CAPS
100.0000 mg | ORAL_CAPSULE | Freq: Two times a day (BID) | ORAL | 0 refills | Status: DC
Start: 2016-09-18 — End: 2016-11-29

## 2016-10-10 DIAGNOSIS — H5213 Myopia, bilateral: Secondary | ICD-10-CM | POA: Diagnosis not present

## 2016-10-15 ENCOUNTER — Telehealth: Payer: Self-pay | Admitting: Urology

## 2016-10-15 DIAGNOSIS — N200 Calculus of kidney: Secondary | ICD-10-CM

## 2016-10-15 NOTE — Telephone Encounter (Signed)
Patient said she was supposed to have an order for a KUB from Dr. Pilar Jarvis but there is not one in her chart? Do you know anything about this? She said you told her to go have it done? If so can somebody put one in for her to have it done?  Thanks,  [name]

## 2016-10-15 NOTE — Telephone Encounter (Signed)
She has stones.  I have put the order in the system.

## 2016-10-16 ENCOUNTER — Ambulatory Visit: Payer: 59 | Admitting: Family Medicine

## 2016-10-19 ENCOUNTER — Ambulatory Visit (INDEPENDENT_AMBULATORY_CARE_PROVIDER_SITE_OTHER): Payer: 59 | Admitting: Family Medicine

## 2016-10-19 ENCOUNTER — Encounter: Payer: Self-pay | Admitting: Family Medicine

## 2016-10-19 VITALS — BP 130/84 | HR 75 | Temp 98.3°F | Wt 283.4 lb

## 2016-10-19 DIAGNOSIS — M9906 Segmental and somatic dysfunction of lower extremity: Secondary | ICD-10-CM

## 2016-10-19 DIAGNOSIS — R252 Cramp and spasm: Secondary | ICD-10-CM | POA: Diagnosis not present

## 2016-10-19 DIAGNOSIS — M9903 Segmental and somatic dysfunction of lumbar region: Secondary | ICD-10-CM | POA: Diagnosis not present

## 2016-10-19 DIAGNOSIS — M5441 Lumbago with sciatica, right side: Secondary | ICD-10-CM | POA: Diagnosis not present

## 2016-10-19 DIAGNOSIS — M9905 Segmental and somatic dysfunction of pelvic region: Secondary | ICD-10-CM | POA: Diagnosis not present

## 2016-10-19 DIAGNOSIS — M9904 Segmental and somatic dysfunction of sacral region: Secondary | ICD-10-CM | POA: Diagnosis not present

## 2016-10-19 DIAGNOSIS — E785 Hyperlipidemia, unspecified: Secondary | ICD-10-CM | POA: Insufficient documentation

## 2016-10-19 DIAGNOSIS — E782 Mixed hyperlipidemia: Secondary | ICD-10-CM

## 2016-10-19 NOTE — Assessment & Plan Note (Signed)
On pravastatin. Unclear if this is causing cramps. Will stop for the weekend and recheck by phone. May consider crestor.

## 2016-10-19 NOTE — Progress Notes (Signed)
BP 130/84 (BP Location: Left Arm, Patient Position: Sitting, Cuff Size: Large)   Pulse 75   Temp 98.3 F (36.8 C)   Wt 283 lb 6.4 oz (128.5 kg)   SpO2 97%   BMI 45.74 kg/m    Subjective:    Patient ID: Latoya Lutz, female    DOB: 04/23/1971, 46 y.o.   MRN: [id number]  HPI: Latoya Lutz is a 46 y.o. female  Chief Complaint  Patient presents with  . Back Pain  . Leg Cramps   BACK PAIN Duration: about 2 weeks ago Mechanism of injury: moved her son out of college  Location: R>L and low back Onset: sudden Severity: 2/10- right now, can get up higher than that Quality: aching and soreness Frequency: constant Radiation: buttocks Aggravating factors: sitting for a long time Alleviating factors: aspercream and sitting Status: better Treatments attempted: aspercream, rest, APAP and ibuprofen  Relief with NSAIDs?: moderate Nighttime pain:  no Paresthesias / decreased sensation:  no Bowel / bladder incontinence:  no Fevers:  no Dysuria / urinary frequency:  no  HYPERLIPIDEMIA Hyperlipidemia status: excellent compliance Satisfied with current treatment?  no Side effects:  yes- really bad cramps on atorvastatin, changed to pravastatin about a month ago- leg cramps started 2 days ago Medication compliance: excellent compliance Past cholesterol meds: atorvastatin, pravastatin Supplements: none Aspirin:  no The 10-year ASCVD risk score Mikey Bussing DC Jr., et al., 2013) is: 1.5%   Values used to calculate the score:     Age: 27 years     Sex: Female     Is Non-Hispanic African American: No     Diabetic: No     Tobacco smoker: No     Systolic Blood Pressure: 725 mmHg     Is BP treated: No     HDL Cholesterol: 45 mg/dL     Total Cholesterol: 215 mg/dL Chest pain:  no Coronary artery disease:  no Family history CAD:  yes Family history early CAD:  no   Relevant past medical, surgical, family and social history reviewed and updated as indicated. Interim medical  history since our last visit reviewed. Allergies and medications reviewed and updated.  Review of Systems  Constitutional: Negative.   Respiratory: Negative.   Cardiovascular: Negative.   Musculoskeletal: Positive for back pain and myalgias. Negative for arthralgias, gait problem, joint swelling, neck pain and neck stiffness.  Neurological: Negative.   Psychiatric/Behavioral: Negative.     Per HPI unless specifically indicated above     Objective:    BP 130/84 (BP Location: Left Arm, Patient Position: Sitting, Cuff Size: Large)   Pulse 75   Temp 98.3 F (36.8 C)   Wt 283 lb 6.4 oz (128.5 kg)   SpO2 97%   BMI 45.74 kg/m   Wt Readings from Last 3 Encounters:  10/19/16 283 lb 6.4 oz (128.5 kg)  08/09/16 279 lb 1.6 oz (126.6 kg)  08/08/16 277 lb (125.6 kg)    Physical Exam  Constitutional: She is oriented to person, place, and time. She appears well-developed and well-nourished. No distress.  HENT:  Head: Normocephalic and atraumatic.  Right Ear: Hearing normal.  Left Ear: Hearing normal.  Nose: Nose normal.  Eyes: Conjunctivae and lids are normal. Right eye exhibits no discharge. Left eye exhibits no discharge. No scleral icterus.  Cardiovascular: Normal rate, regular rhythm, normal heart sounds and intact distal pulses.  Exam reveals no gallop and no friction rub.   No murmur heard. Pulmonary/Chest: Effort normal and breath  sounds normal. No respiratory distress. She has no wheezes. She has no rales. She exhibits no tenderness.  Neurological: She is alert and oriented to person, place, and time.  Skin: Skin is warm, dry and intact. No rash noted. No erythema. No pallor.  Psychiatric: She has a normal mood and affect. Her speech is normal and behavior is normal. Judgment and thought content normal. Cognition and memory are normal.  Nursing note and vitals reviewed. Back Exam:    Inspection:  Normal spinal curvature.  No deformity, ecchymosis, erythema, or lesions      Palpation:     Midline spinal tenderness: no      Paralumbar tenderness: yes Right     Parathoracic tenderness: no      Buttocks tenderness: yesRight     Range of Motion:      Flexion: Fingers to Knees     Extension:Decreased     Lateral bending:Decreased    Rotation:Decreased    Neuro Exam:Lower extremity DTRs normal & symmetric.  Strength and sensation intact.    Special Tests:      Straight leg raise:negative  Musculoskeletal:  Exam found Decreased ROM, Tissue texture changes, Tenderness to palpation and Asymmetry of patient's  lumbar, pelvis, sacrum and lower extremity Osteopathic Structural Exam:   Lumbar: L4-5SLRR, QL hypertonic on the R, psoas hypertonic on the R  Pelvis: Anterior R innominate, SI joint restricted on the R  Sacrum: L on L torsion  Lower Extremity: glut spasm, hamstring hypertonic on the R   Results for orders placed or performed in visit on 08/09/16  Microscopic Examination  Result Value Ref Range   WBC, UA 0-5 0 - 5 /hpf   RBC, UA 0-2 0 - 2 /hpf   Epithelial Cells (non renal) 0-10 0 - 10 /hpf   Casts Present (A) None seen /lpf   Cast Type Hyaline casts N/A   Mucus, UA Present (A) Not Estab.   Bacteria, UA None seen None seen/Few  Urinalysis, Complete  Result Value Ref Range   Specific Gravity, UA 1.025 1.005 - 1.030   pH, UA 5.5 5.0 - 7.5   Color, UA Yellow Yellow   Appearance Ur Cloudy (A) Clear   Leukocytes, UA Negative Negative   Protein, UA Trace (A) Negative/Trace   Glucose, UA Negative Negative   Ketones, UA Trace (A) Negative   RBC, UA Negative Negative   Bilirubin, UA Negative Negative   Urobilinogen, Ur 0.2 0.2 - 1.0 mg/dL   Nitrite, UA Negative Negative   Microscopic Examination See below:       Assessment & Plan:   Problem List Items Addressed This Visit      Other   Hyperlipidemia    On pravastatin. Unclear if this is causing cramps. Will stop for the weekend and recheck by phone. May consider crestor.        Other Visit  Diagnoses    Acute right-sided low back pain with right-sided sciatica    -  Primary   Acute due to moving her son. I think she would benefit from OMT. Treated today with good results as below. Call with any concerns.    Leg cramps       Unclear if this is from cholesterol meds. Will stop for the weekend and restart to see if still happening or stops.    Lumbar region somatic dysfunction       Somatic dysfunction of sacral region       Somatic dysfunction of pelvis region  Lower limb region somatic dysfunction           Follow up plan: Return if symptoms worsen or fail to improve.

## 2016-10-24 ENCOUNTER — Telehealth: Payer: Self-pay | Admitting: Family Medicine

## 2016-10-24 MED ORDER — FLUTICASONE PROPIONATE 50 MCG/ACT NA SUSP: 2.0000 | Freq: Every day | NASAL | 3 refills | Status: DC

## 2016-10-24 NOTE — Telephone Encounter (Signed)
Patient would like to have Dr Wynetta Emery send in a script for her flonase with a 90 day supply to the [facility] pharmacy  Thanks  [phone]

## 2016-10-24 NOTE — Telephone Encounter (Signed)
Rx sent to her pharmacy 

## 2016-10-24 NOTE — Telephone Encounter (Signed)
Routing to provider  

## 2016-11-09 ENCOUNTER — Ambulatory Visit
Admission: RE | Admit: 2016-11-09 | Discharge: 2016-11-09 | Disposition: A | Discharge status: Home / Self Care | Payer: 59 | Source: Ambulatory Visit | Attending: Urology | Admitting: Urology

## 2016-11-09 DIAGNOSIS — N200 Calculus of kidney: Secondary | ICD-10-CM | POA: Diagnosis not present

## 2016-11-13 ENCOUNTER — Encounter: Payer: Self-pay | Admitting: Physician Assistant

## 2016-11-13 ENCOUNTER — Ambulatory Visit: Payer: Self-pay | Admitting: Physician Assistant

## 2016-11-13 VITALS — BP 138/80 | HR 70 | Temp 97.9°F

## 2016-11-13 DIAGNOSIS — L259 Unspecified contact dermatitis, unspecified cause: Secondary | ICD-10-CM

## 2016-11-13 MED ORDER — DEXAMETHASONE SODIUM PHOSPHATE 10 MG/ML IJ SOLN
10.0000 mg | Freq: Once | INTRAMUSCULAR | Status: AC
  Administered 2016-11-13: 10 mg via INTRAMUSCULAR

## 2016-11-13 NOTE — Addendum Note (Signed)
Addended by: Vassie Loll D on: 11/13/2016 12:28 PM   Modules accepted: Orders

## 2016-11-13 NOTE — Progress Notes (Signed)
S: c/o itchy rash on r arm and r leg, was outside in yard and then broke out, sx for few days, tried multiple otc meds without relief, denies fever/chills  O: vitals wnl, nad, lungs c t a, cv rrr, skin with small raised red areas some with streaks/blisters, no drainage, n/v intact  A: acute contact dermatitis  P: decadron 10 mg im, rx for sterapred if not improved in 3 days, (12 day dose pack)

## 2016-11-29 ENCOUNTER — Encounter: Payer: Self-pay | Admitting: Urology

## 2016-11-29 ENCOUNTER — Ambulatory Visit (INDEPENDENT_AMBULATORY_CARE_PROVIDER_SITE_OTHER): Payer: 59 | Admitting: Urology

## 2016-11-29 VITALS — BP 147/81 | HR 81 | Ht 66.0 in | Wt 283.9 lb

## 2016-11-29 DIAGNOSIS — Z87448 Personal history of other diseases of urinary system: Secondary | ICD-10-CM

## 2016-11-29 DIAGNOSIS — N200 Calculus of kidney: Secondary | ICD-10-CM | POA: Diagnosis not present

## 2016-11-29 LAB — URINALYSIS, COMPLETE
Bilirubin, UA: NEGATIVE
GLUCOSE, UA: NEGATIVE
Ketones, UA: NEGATIVE
LEUKOCYTES UA: NEGATIVE
NITRITE UA: NEGATIVE
Protein, UA: NEGATIVE
RBC, UA: NEGATIVE
Specific Gravity, UA: 1.015 (ref 1.005–1.030)
UUROB: 0.2 mg/dL (ref 0.2–1.0)
pH, UA: 5.5 (ref 5.0–7.5)

## 2016-11-29 MED ORDER — NITROFURANTOIN MONOHYD MACRO 100 MG PO CAPS: 100.0000 mg | ORAL_CAPSULE | Freq: Two times a day (BID) | ORAL | 0 refills | Status: DC

## 2016-11-29 MED ORDER — FLUCONAZOLE 150 MG PO TABS: 150.0000 mg | ORAL_TABLET | Freq: Once | ORAL | 0 refills | Status: AC

## 2016-11-29 NOTE — Progress Notes (Signed)
2:11 PM   [facility] Nov 01, 1970 [id number]  Referring provider: Valerie Roys, DO [place], [name]  Chief Complaint  Patient presents with  . Follow-up    urethral dilation    HPI: Patient  is a 46 y/o Caucasian female with a h/o urethral stricture who presents today for a urethral dilation.   Background history She was referred to [place] in 2013 for increased frequency, increased urgency and feelings of not emptying her bladder by Dr. Ronette Lutz, M.D.  Per patient, she was diagnosed by Dr. Madelin Lutz with urethral dilation and had been receiving serial dilations every 4 to 6 months since 2013.    She also has a history of right ureteral stones treated with ESWL and right URS/LL/right ureteral stent placement and stent removal in winter 2016.  A renal ultrasound completed on 07/13/2015 did not note any hydronephrosis or nephrolithiasis. KUB On 08/22/2015 noted probable interval passage of distal left ureteral calculi noted on prior KUB and a question of a small left renal calculus.  KUB on 11/09/2016 noted a 5 mm calcification projects over the lower pole of the left renal shadow consistent with nephrolithiasis.  I have independently reviewed the films.  She has not had any flank pain or gross hematuria.    Today, patient presents requesting urethral dilatation. She is experiencing increasing frequent urination and difficulty urinating.  Her UA is unremarkable.  She is not experiencing any gross hematuria, dysuria or suprapubic pain. She has not had any flank pain. She is on any fevers, chills, nausea or vomiting.   PMH: Past Medical History:  Diagnosis Date  . Acid reflux   . Arthritis   . Candidal dermatitis 05/25/2014  . Cervical spine disease   . Chicken pox   . Chronic urethral narrowing    undergoing stretching  . Complication of anesthesia   . Endometriosis    Dr. Rushie Lutz at [facility], removed   . Esophageal reflux 05/25/2014  . Gross hematuria  04/25/2015  . H/O cystoscopy    normal  . Heart murmur   . Infection of urinary tract 09/11/2014  . Kidney stone 09/11/2014  . Kidney stones   . Microscopic hematuria 09/11/2014  . Obesity, morbid, BMI 40.0-49.9 (Latoya Lutz) 04/10/2012  . PONV (postoperative nausea and vomiting)   . Right ureteral stone 04/28/2015  . Skin cancer   . Urinary retention     Surgical History: Past Surgical History:  Procedure Laterality Date  . ABDOMINAL HYSTERECTOMY     [facility] complete  . BLADDER SURGERY  07/2003  . CARPAL TUNNEL RELEASE Right   . CESAREAN SECTION  1998  . CHOLECYSTECTOMY    . CYSTOSCOPY W/ RETROGRADES Right 05/16/2015   Procedure: CYSTOSCOPY WITH RETROGRADE PYELOGRAM;  Surgeon: Nickie Retort, MD;  Location: [facility] OR[facility];  Service: Urology;  Laterality: Right;  . CYSTOSCOPY W/ URETERAL STENT PLACEMENT Right 05/16/2015   Procedure: CYSTOSCOPY WITH STENT REPLACEMENT;  Surgeon: Nickie Retort, MD;  Location: [facility];  Service: Urology;  Laterality: Right;  . CYSTOSCOPY WITH STENT PLACEMENT Right 04/29/2015   Procedure: CYSTOSCOPY WITH STENT PLACEMENT;  Surgeon: Nickie Retort, MD;  Location: [facility];  Service: Urology;  Laterality: Right;  . EXTRACORPOREAL SHOCK WAVE LITHOTRIPSY Left 06/02/2015   Procedure: EXTRACORPOREAL SHOCK WAVE LITHOTRIPSY (ESWL);  Surgeon: Hollice Espy, MD;  Location: [facility];  Service: Urology;  Laterality: Left;  . FOOT SURGERY  2003  . KNEE SURGERY  2005  . TONSILLECTOMY    .  TONSILLECTOMY     lingual growth on vocal cord removal  . URETEROSCOPY WITH HOLMIUM LASER LITHOTRIPSY Right 05/16/2015   Procedure: URETEROSCOPY WITH HOLMIUM LASER LITHOTRIPSY;  Surgeon: Nickie Retort, MD;  Location: [facility];  Service: Urology;  Laterality: Right;    Home Medications:  Allergies as of 11/29/2016      Reactions   Atorvastatin Other (See Comments)   Leg cramps      Medication List       Accurate as of 11/29/16  2:11 PM. Always use your most recent med list.            acyclovir 400 MG tablet Commonly known as:  ZOVIRAX Take 1 tablet (400 mg total) by mouth 3 (three) times daily.   busPIRone 10 MG tablet Commonly known as:  BUSPAR TAKE 1 TABLET BY MOUTH 3 TIMES DAILY   CALCIUM 600 PO Take 1 tablet by mouth daily.   dextromethorphan-guaiFENesin 30-600 MG 12hr tablet Commonly known as:  MUCINEX DM Take 1 tablet by mouth 2 (two) times daily.   fluconazole 150 MG tablet Commonly known as:  DIFLUCAN Take 1 tablet (150 mg total) by mouth once.   fluticasone 50 MCG/ACT nasal spray Commonly known as:  FLONASE Place 2 sprays into both nostrils daily.   meloxicam 7.5 MG tablet Commonly known as:  MOBIC Take 1 tablet (7.5 mg total) by mouth as needed. Reported on 11/04/2015   MENOPAUSE FORMULA Tabs Take by mouth. Reported on 11/04/2015   nitrofurantoin (macrocrystal-monohydrate) 100 MG capsule Commonly known as:  MACROBID Take 1 capsule (100 mg total) by mouth every 12 (twelve) hours.   pantoprazole 40 MG tablet Commonly known as:  PROTONIX TAKE 1 TABLET BY MOUTH DAILY.   pravastatin 20 MG tablet Commonly known as:  PRAVACHOL Take 1 tablet (20 mg total) by mouth daily.   pseudoephedrine 30 MG tablet Commonly known as:  SUDAFED Take 30 mg by mouth 3 (three) times daily.   Simethicone 180 MG Caps Take 1 capsule (180 mg total) by mouth 3 (three) times daily with meals.   Vitamin D (Cholecalciferol) 1000 units Tabs Take by mouth.   vitamin E 400 UNIT capsule Take 400 Units by mouth daily.       Allergies:  Allergies  Allergen Reactions  . Atorvastatin Other (See Comments)    Leg cramps    Family History: Family History  Problem Relation Age of Onset  . Heart disease Mother        Pacemaker and defib  . Hypertension Mother   . Hyperlipidemia Mother   . Cancer Father   . COPD Father        Lung and brain cancer  . Cancer Maternal Grandmother        Colon  . Diabetes Paternal Grandmother   . Cirrhosis Paternal  Grandmother   . Cancer Paternal Grandmother   . Kidney disease Neg Hx   . Bladder Cancer Neg Hx   . Breast cancer Neg Hx     Social History:  reports that she quit smoking about 8 years ago. Her smoking use included Cigarettes. She has a 8.50 pack-year smoking history. She has never used smokeless tobacco. She reports that she does not drink alcohol or use drugs.  ROS: Urological Symptom Review UROLOGY Frequent Urination?: No Hard to postpone urination?: No Burning/pain with urination?: No Get up at night to urinate?: No Leakage of urine?: No Urine stream starts and stops?: No Trouble starting stream?: No Do you have  to strain to urinate?: No Blood in urine?: No Urinary tract infection?: No Sexually transmitted disease?: No Injury to kidneys or bladder?: No Painful intercourse?: No Weak stream?: No Currently pregnant?: No Vaginal bleeding?: No Last menstrual period?: n Gastrointestinal Nausea?: No Vomiting?: No Indigestion/heartburn?: No Diarrhea?: No Constipation?: No Constitutional Fever: No Night sweats?: No Weight loss?: No Fatigue?: No Skin Skin rash/lesions?: No Itching?: No Eyes Blurred vision?: No Double vision?: No Ears/Nose/Throat Sore throat?: No Sinus problems?: No Hematologic/Lymphatic Swollen glands?: No Easy bruising?: No Cardiovascular Leg swelling?: No Chest pain?: No Respiratory Cough?: No Shortness of breath?: No Endocrine Excessive thirst?: No Musculoskeletal Back pain?: No Joint pain?: No Neurological Headaches?: No Dizziness?: No Psychologic Depression?: No Anxiety?: No UROLOGY Frequent Urination?: No Hard to postpone urination?: No Burning/pain with urination?: No Get up at night to urinate?: No Leakage of urine?: No Urine stream starts and stops?: No Trouble starting stream?: No Do you have to strain to urinate?: No Blood in urine?: No Urinary tract infection?: No Sexually transmitted disease?: No Injury to  kidneys or bladder?: No Painful intercourse?: No Weak stream?: No Currently pregnant?: No Vaginal bleeding?: No Last menstrual period?: n Gastrointestinal Nausea?: No Vomiting?: No Indigestion/heartburn?: No Diarrhea?: No Constipation?: No Constitutional Fever: No Night sweats?: No Weight loss?: No Fatigue?: No Skin Skin rash/lesions?: No Itching?: No Eyes Blurred vision?: No Double vision?: No Ears/Nose/Throat Sore throat?: No Sinus problems?: No Hematologic/Lymphatic Swollen glands?: No Easy bruising?: No Cardiovascular Leg swelling?: No Chest pain?: No Respiratory Cough?: No Shortness of breath?: No Endocrine Excessive thirst?: No Musculoskeletal Back pain?: No Joint pain?: No Neurological Headaches?: No Dizziness?: No Psychologic Depression?: No Anxiety?: No     Physical Exam: Blood pressure (!) 147/81, pulse 81, height 5\' 6"  (1.676 m), weight 283 lb 14.4 oz (128.8 kg). Constitutional: Well nourished. Alert and oriented, No acute distress. HEENT: [place] AT, moist mucus membranes. Trachea midline, no masses. Cardiovascular: No clubbing, cyanosis, or edema. Respiratory: Normal respiratory effort, no increased work of breathing. GI: Abdomen is soft, non tender, non distended, no abdominal masses. Liver and spleen not palpable.  No hernias appreciated.  Stool sample for occult testing is not indicated.   GU: No CVA tenderness.  No bladder fullness or masses.  Normal external genitalia, normal pubic hair distribution, no lesions.  Normal urethral meatus, no lesions, no prolapse, no discharge.   No urethral masses, tenderness and/or tenderness. No bladder fullness, tenderness or masses. Normal vagina mucosa, good estrogen effect, no discharge, no lesions, good pelvic support, no cystocele or rectocele noted.  Cervix, uterus and adnexa are surgically absent.    Anus and perineum are without rashes or lesions.    Skin: No rashes, bruises or suspicious  lesions. Lymph: No cervical or inguinal adenopathy. Neurologic: Grossly intact, no focal deficits, moving all 4 extremities. Psychiatric: Normal mood and affect.   Laboratory Data: Urinalysis Unremarkable.  See EPIC.   Pertinent imaging CLINICAL DATA:  Nephrolithiasis  EXAM: ABDOMEN - 1 VIEW  COMPARISON:  CT from 04/27/2015  FINDINGS: The bowel gas pattern is normal. A 5 mm calculus projects over the lower pole of the left renal shadow similar to CT findings. No right-sided renal calculi are apparent. Stable tiny phlebolith in the left hemipelvis. No organomegaly. Lower lumbar degenerative facet arthropathy L4 through S1. Cholecystectomy clips are seen in the right upper quadrant.  IMPRESSION: A 5 mm calcification projects over the lower pole of the left renal shadow consistent with nephrolithiasis.   Electronically Signed   By: Shanon Brow  Randel Pigg M.D.   On: 11/09/2016 18:57  Procedure:   Patient is placed in stirrups and her urethral meatus and vulva are cleansed with Betadine.  2% Lidocaine jelly was inserted into her urethra.  I then dilated her with Elby Showers sounds to a 21f without difficultly.  She tolerated the procedure well.  She will return in 4 months.  Assessment & Plan:    1. 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  Her UA was negative for microscopic hematuria.  She will follow up in four months time for next dilation.    - patient given Macrobid and Diflucan post procedurally   - Urinalysis, Complete  2. Left renal stone    Patient status post ESWL and right ureteroscopy for right ureteral stones. Recent renal ultrasound did not note any nephrolithiasis or hydronephrosis.    Recent KUB was positive for a 5 mm LLP stone.    Her UA is negative for microscopic hematuria.     Return in about 4 months (around 03/31/2017) for dilation.  Zara Council, [facility] [address], [place], [place] [address] [phone]

## 2016-11-30 ENCOUNTER — Other Ambulatory Visit: Payer: Self-pay | Admitting: Family Medicine

## 2016-11-30 ENCOUNTER — Encounter: Payer: Self-pay | Admitting: Family Medicine

## 2016-11-30 ENCOUNTER — Other Ambulatory Visit: Payer: Self-pay

## 2016-11-30 DIAGNOSIS — K219 Gastro-esophageal reflux disease without esophagitis: Secondary | ICD-10-CM

## 2016-11-30 MED ORDER — PANTOPRAZOLE SODIUM 40 MG PO TBEC: 40.0000 mg | DELAYED_RELEASE_TABLET | Freq: Every day | ORAL | 1 refills | Status: DC

## 2016-12-03 ENCOUNTER — Ambulatory Visit: Payer: 59 | Admitting: Urology

## 2016-12-13 ENCOUNTER — Ambulatory Visit: Payer: 59 | Admitting: Urology

## 2017-02-21 DIAGNOSIS — L821 Other seborrheic keratosis: Secondary | ICD-10-CM | POA: Diagnosis not present

## 2017-02-21 DIAGNOSIS — L820 Inflamed seborrheic keratosis: Secondary | ICD-10-CM | POA: Diagnosis not present

## 2017-02-21 DIAGNOSIS — L400 Psoriasis vulgaris: Secondary | ICD-10-CM | POA: Diagnosis not present

## 2017-02-21 DIAGNOSIS — L918 Other hypertrophic disorders of the skin: Secondary | ICD-10-CM | POA: Diagnosis not present

## 2017-02-21 DIAGNOSIS — L570 Actinic keratosis: Secondary | ICD-10-CM | POA: Diagnosis not present

## 2017-03-04 ENCOUNTER — Other Ambulatory Visit: Payer: Self-pay | Admitting: Family Medicine

## 2017-03-15 ENCOUNTER — Ambulatory Visit: Payer: Self-pay | Admitting: Registered Nurse

## 2017-03-15 VITALS — BP 130/89 | HR 87 | Temp 98.5°F | Resp 16

## 2017-03-15 DIAGNOSIS — J019 Acute sinusitis, unspecified: Secondary | ICD-10-CM

## 2017-03-15 DIAGNOSIS — H6692 Otitis media, unspecified, left ear: Secondary | ICD-10-CM

## 2017-03-15 DIAGNOSIS — J209 Acute bronchitis, unspecified: Secondary | ICD-10-CM

## 2017-03-15 MED ORDER — PREDNISONE 20 MG PO TABS: 30.0000 mg | ORAL_TABLET | Freq: Every day | ORAL | 0 refills | Status: AC

## 2017-03-15 MED ORDER — FLUCONAZOLE 150 MG PO TABS: 150.0000 mg | ORAL_TABLET | Freq: Once | ORAL | 0 refills | Status: AC

## 2017-03-15 MED ORDER — AMOXICILLIN-POT CLAVULANATE 875-125 MG PO TABS: 1.0000 | ORAL_TABLET | Freq: Two times a day (BID) | ORAL | 0 refills | Status: AC

## 2017-03-15 MED ORDER — SALINE SPRAY 0.65 % NA SOLN: 2.0000 | NASAL | 0 refills | Status: DC

## 2017-03-15 NOTE — Patient Instructions (Signed)
Otitis Media, Adult Otitis media is redness, soreness, and puffiness (swelling) in the space just behind your eardrum (middle ear). It may be caused by allergies or infection. It often happens along with a cold. Follow these instructions at home:  Take your medicine as told. Finish it even if you start to feel better.  Only take over-the-counter or prescription medicines for pain, discomfort, or fever as told by your doctor.  Follow up with your doctor as told. Contact a doctor if:  You have otitis media only in one ear, or bleeding from your nose, or both.  You notice a lump on your neck.  You are not getting better in 3-5 days.  You feel worse instead of better. Get help right away if:  You have pain that is not helped with medicine.  You have puffiness, redness, or pain around your ear.  You get a stiff neck.  You cannot move part of your face (paralysis).  You notice that the bone behind your ear hurts when you touch it. This information is not intended to replace advice given to you by your health care provider. Make sure you discuss any questions you have with your health care provider. Document Released: 11/14/2007 Document Revised: 11/03/2015 Document Reviewed: 12/23/2012 Elsevier Interactive Patient Education  2017 [employer] Sinus Rinse What is a sinus rinse? A sinus rinse is a simple home treatment that is used to rinse your sinuses with a sterile mixture of salt and water (saline solution). Sinuses are air-filled spaces in your skull behind the bones of your face and forehead that open into your nasal cavity. You will use the following:  Saline solution.  Neti pot or spray bottle. This releases the saline solution into your nose and through your sinuses. Neti pots and spray bottles can be purchased at [employer], a health food store, or online.  When would I do a sinus rinse? A sinus rinse can help to clear mucus, dirt, dust, or pollen from the nasal  cavity. You may do a sinus rinse when you have a cold, a virus, nasal allergy symptoms, a sinus infection, or stuffiness in the nose or sinuses. If you are considering a sinus rinse:  Ask your child's health care provider before performing a sinus rinse on your child.  Do not do a sinus rinse if you have had ear or nasal surgery, ear infection, or blocked ears.  How do I do a sinus rinse?  Wash your hands.  Disinfect your device according to the directions provided and then dry it.  Use the solution that comes with your device or one that is sold separately in stores. Follow the mixing directions on the package.  Fill your device with the amount of saline solution as directed by the device instructions.  Stand over a sink and tilt your head sideways over the sink.  Place the spout of the device in your upper nostril (the one closer to the ceiling).  Gently pour or squeeze the saline solution into the nasal cavity. The liquid should drain to the lower nostril if you are not overly congested.  Gently blow your nose. Blowing too hard may cause ear pain.  Repeat in the other nostril.  Clean and rinse your device with clean water and then air-dry it. Are there risks of a sinus rinse? Sinus rinse is generally very safe and effective. However, there are a few risks, which include:  A burning sensation in the sinuses. This may happen if  you do not make the saline solution as directed. Make sure to follow all directions when making the saline solution.  Infection from contaminated water. This is rare, but possible.  Nasal irritation.  This information is not intended to replace advice given to you by your health care provider. Make sure you discuss any questions you have with your health care provider. Document Released: 12/23/2013 Document Revised: 04/24/2016 Document Reviewed: 10/13/2013 Elsevier Interactive Patient Education  2017 [employer] Sinusitis, Adult Sinusitis is  soreness and inflammation of your sinuses. Sinuses are hollow spaces in the bones around your face. Your sinuses are located:  Around your eyes.  In the middle of your forehead.  Behind your nose.  In your cheekbones.  Your sinuses and nasal passages are lined with a stringy fluid (mucus). Mucus normally drains out of your sinuses. When your nasal tissues become inflamed or swollen, the mucus can become trapped or blocked so air cannot flow through your sinuses. This allows bacteria, viruses, and funguses to grow, which leads to infection. Sinusitis can develop quickly and last for 7?10 days (acute) or for more than 12 weeks (chronic). Sinusitis often develops after a cold. What are the causes? This condition is caused by anything that creates swelling in the sinuses or stops mucus from draining, including:  Allergies.  Asthma.  Bacterial or viral infection.  Abnormally shaped bones between the nasal passages.  Nasal growths that contain mucus (nasal polyps).  Narrow sinus openings.  Pollutants, such as chemicals or irritants in the air.  A foreign object stuck in the nose.  A fungal infection. This is rare.  What increases the risk? The following factors may make you more likely to develop this condition:  Having allergies or asthma.  Having had a recent cold or respiratory tract infection.  Having structural deformities or blockages in your nose or sinuses.  Having a weak immune system.  Doing a lot of swimming or diving.  Overusing nasal sprays.  Smoking.  What are the signs or symptoms? The main symptoms of this condition are pain and a feeling of pressure around the affected sinuses. Other symptoms include:  Upper toothache.  Earache.  Headache.  Bad breath.  Decreased sense of smell and taste.  A cough that may get worse at night.  Fatigue.  Fever.  Thick drainage from your nose. The drainage is often green and it may contain pus  (purulent).  Stuffy nose or congestion.  Postnasal drip. This is when extra mucus collects in the throat or back of the nose.  Swelling and warmth over the affected sinuses.  Sore throat.  Sensitivity to light.  How is this diagnosed? This condition is diagnosed based on symptoms, a medical history, and a physical exam. To find out if your condition is acute or chronic, your health care provider may:  Look in your nose for signs of nasal polyps.  Tap over the affected sinus to check for signs of infection.  View the inside of your sinuses using an imaging device that has a light attached (endoscope).  If your health care provider suspects that you have chronic sinusitis, you may also:  Be tested for allergies.  Have a sample of mucus taken from your nose (nasal culture) and checked for bacteria.  Have a mucus sample examined to see if your sinusitis is related to an allergy.  If your sinusitis does not respond to treatment and it lasts longer than 8 weeks, you may have an MRI or CT  scan to check your sinuses. These scans also help to determine how severe your infection is. In rare cases, a bone biopsy may be done to rule out more serious types of fungal sinus disease. How is this treated? Treatment for sinusitis depends on the cause and whether your condition is chronic or acute. If a virus is causing your sinusitis, your symptoms will go away on their own within 10 days. You may be given medicines to relieve your symptoms, including:  Topical nasal decongestants. They shrink swollen nasal passages and let mucus drain from your sinuses.  Antihistamines. These drugs block inflammation that is triggered by allergies. This can help to ease swelling in your nose and sinuses.  Topical nasal corticosteroids. These are nasal sprays that ease inflammation and swelling in your nose and sinuses.  Nasal saline washes. These rinses can help to get rid of thick mucus in your nose.  If  your condition is caused by bacteria, you will be given an antibiotic medicine. If your condition is caused by a fungus, you will be given an antifungal medicine. Surgery may be needed to correct underlying conditions, such as narrow nasal passages. Surgery may also be needed to remove polyps. Follow these instructions at home: Medicines  Take, use, or apply over-the-counter and prescription medicines only as told by your health care provider. These may include nasal sprays.  If you were prescribed an antibiotic medicine, take it as told by your health care provider. Do not stop taking the antibiotic even if you start to feel better. Hydrate and Humidify  Drink enough water to keep your urine clear or pale yellow. Staying hydrated will help to thin your mucus.  Use a cool mist humidifier to keep the humidity level in your home above 50%.  Inhale steam for 10-15 minutes, 3-4 times a day or as told by your health care provider. You can do this in the bathroom while a hot shower is running.  Limit your exposure to cool or dry air. Rest  Rest as much as possible.  Sleep with your head raised (elevated).  Make sure to get enough sleep each night. General instructions  Apply a warm, moist washcloth to your face 3-4 times a day or as told by your health care provider. This will help with discomfort.  Wash your hands often with soap and water to reduce your exposure to viruses and other germs. If soap and water are not available, use hand sanitizer.  Do not smoke. Avoid being around people who are smoking (secondhand smoke).  Keep all follow-up visits as told by your health care provider. This is important. Contact a health care provider if:  You have a fever.  Your symptoms get worse.  Your symptoms do not improve within 10 days. Get help right away if:  You have a severe headache.  You have persistent vomiting.  You have pain or swelling around your face or eyes.  You have  vision problems.  You develop confusion.  Your neck is stiff.  You have trouble breathing. This information is not intended to replace advice given to you by your health care provider. Make sure you discuss any questions you have with your health care provider. Document Released: 05/28/2005 Document Revised: 01/22/2016 Document Reviewed: 03/23/2015 Elsevier Interactive Patient Education  2017 [employer] Acute Bronchitis, Adult Acute bronchitis is sudden (acute) swelling of the air tubes (bronchi) in the lungs. Acute bronchitis causes these tubes to fill with mucus, which can make it hard  to breathe. It can also cause coughing or wheezing. In adults, acute bronchitis usually goes away within 2 weeks. A cough caused by bronchitis may last up to 3 weeks. Smoking, allergies, and asthma can make the condition worse. Repeated episodes of bronchitis may cause further lung problems, such as chronic obstructive pulmonary disease (COPD). What are the causes? This condition can be caused by germs and by substances that irritate the lungs, including:  Cold and flu viruses. This condition is most often caused by the same virus that causes a cold.  Bacteria.  Exposure to tobacco smoke, dust, fumes, and air pollution.  What increases the risk? This condition is more likely to develop in people who:  Have close contact with someone with acute bronchitis.  Are exposed to lung irritants, such as tobacco smoke, dust, fumes, and vapors.  Have a weak immune system.  Have a respiratory condition such as asthma.  What are the signs or symptoms? Symptoms of this condition include:  A cough.  Coughing up clear, yellow, or green mucus.  Wheezing.  Chest congestion.  Shortness of breath.  A fever.  Body aches.  Chills.  A sore throat.  How is this diagnosed? This condition is usually diagnosed with a physical exam. During the exam, your health care provider may order tests, such as  chest X-rays, to rule out other conditions. He or she may also:  Test a sample of your mucus for bacterial infection.  Check the level of oxygen in your blood. This is done to check for pneumonia.  Do a chest X-ray or lung function testing to rule out pneumonia and other conditions.  Perform blood tests.  Your health care provider will also ask about your symptoms and medical history. How is this treated? Most cases of acute bronchitis clear up over time without treatment. Your health care provider may recommend:  Drinking more fluids. Drinking more makes your mucus thinner, which may make it easier to breathe.  Taking a medicine for a fever or cough.  Taking an antibiotic medicine.  Using an inhaler to help improve shortness of breath and to control a cough.  Using a cool mist vaporizer or humidifier to make it easier to breathe.  Follow these instructions at home: Medicines  Take over-the-counter and prescription medicines only as told by your health care provider.  If you were prescribed an antibiotic, take it as told by your health care provider. Do not stop taking the antibiotic even if you start to feel better. General instructions  Get plenty of rest.  Drink enough fluids to keep your urine clear or pale yellow.  Avoid smoking and secondhand smoke. Exposure to cigarette smoke or irritating chemicals will make bronchitis worse. If you smoke and you need help quitting, ask your health care provider. Quitting smoking will help your lungs heal faster.  Use an inhaler, cool mist vaporizer, or humidifier as told by your health care provider.  Keep all follow-up visits as told by your health care provider. This is important. How is this prevented? To lower your risk of getting this condition again:  Wash your hands often with soap and water. If soap and water are not available, use hand sanitizer.  Avoid contact with people who have cold symptoms.  Try not to touch  your hands to your mouth, nose, or eyes.  Make sure to get the flu shot every year.  Contact a health care provider if:  Your symptoms do not improve in 2 weeks  of treatment. Get help right away if:  You cough up blood.  You have chest pain.  You have severe shortness of breath.  You become dehydrated.  You faint or keep feeling like you are going to faint.  You keep vomiting.  You have a severe headache.  Your fever or chills gets worse. This information is not intended to replace advice given to you by your health care provider. Make sure you discuss any questions you have with your health care provider. Document Released: 07/05/2004 Document Revised: 12/21/2015 Document Reviewed: 11/16/2015 Elsevier Interactive Patient Education  2017 [employer].

## 2017-03-15 NOTE — Progress Notes (Signed)
Subjective:    Patient ID: Latoya Lutz, female    DOB: [birth year], 46 y.o.   MRN: [id number]  46y/o married caucasian female established patient here for evaluation sinus pain/pressure/congestion, cough with intermittent wheezing/chest tightness and  Bilateral ear pain.  Started using her azelastine nose spray in addition to flonase 1 spray daily but still getting headache and symptoms.  Currently not using nasal saline.  Last sinus infection one year ago augmentin, diflucan and prednisone worked well for her.  Usually needs single dose diflucan for vaginal candidiasis with antibiotic use  Denied history of seizures.  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  HENT: Positive for congestion, ear pain, postnasal drip, rhinorrhea, sinus pain, sinus pressure and sneezing. Negative for dental problem, drooling, ear discharge, facial swelling, hearing loss, mouth sores, nosebleeds, sore throat, tinnitus, trouble swallowing and voice change.   Eyes: Negative for photophobia, pain, discharge, redness, itching and visual disturbance.  Respiratory: Positive for cough and wheezing. Negative for choking, chest tightness, shortness of breath and stridor.   Cardiovascular: Negative for chest pain, palpitations and leg swelling.  Gastrointestinal: Negative for diarrhea and vomiting.  Endocrine: Negative for cold intolerance and heat intolerance.  Genitourinary: Negative for difficulty urinating, dysuria and hematuria.  Musculoskeletal: Negative for arthralgias, back pain, gait problem, joint swelling, myalgias, neck pain and neck stiffness.  Skin: Negative for color change, pallor, rash and wound.  Allergic/Immunologic: Positive for environmental allergies. Negative for food allergies.  Neurological: Positive for headaches. Negative for dizziness, tremors,  seizures, syncope, facial asymmetry, speech difficulty, weakness, light-headedness and numbness.  Hematological: Negative for adenopathy. Does not bruise/bleed easily.  Psychiatric/Behavioral: Positive for sleep disturbance. Negative for agitation, behavioral problems and confusion.       Objective:   Physical Exam  Constitutional: She is oriented to person, place, and time. Vital signs are normal. She appears well-developed and well-nourished. She is active and cooperative.  Non-toxic appearance. She does not have a sickly appearance. She appears ill. No distress.  HENT:  Head: Normocephalic and atraumatic.  Right Ear: Hearing, external ear and ear canal normal. Tympanic membrane is erythematous and bulging. A middle ear effusion is present.  Left Ear: Hearing, external ear and ear canal normal. A middle ear effusion is present.  Nose: Mucosal edema and rhinorrhea present. No nose lacerations, sinus tenderness, nasal deformity, septal deviation or nasal septal hematoma. No epistaxis.  No foreign bodies. Right sinus exhibits no maxillary sinus tenderness and no frontal sinus tenderness. Left sinus exhibits no maxillary sinus tenderness and no frontal sinus tenderness.  Mouth/Throat: Uvula is midline and mucous membranes are normal. Mucous membranes are not pale, not dry and not cyanotic. She does not have dentures. No oral lesions. No trismus in the jaw. Normal dentition. No dental abscesses, uvula swelling, lacerations or dental caries. Posterior oropharyngeal edema and posterior oropharyngeal erythema present. No oropharyngeal exudate or tonsillar abscesses.  Left TM air fluid level clear bulging erythematous around diameter/perimeter; right tm air fluid level clear; bilateral allergic shiners; cobblestoning posterior pharynx; bilateral nasal turbinates edema/erythema clear discharge; nasally voice  Eyes: Pupils are equal, round, and reactive to light. Conjunctivae, EOM and lids are normal. Right  eye exhibits no chemosis, no discharge, no exudate and no hordeolum. No foreign body present in the right eye. Left eye exhibits no chemosis, no discharge, no exudate and no hordeolum. No  foreign body present in the left eye. Right conjunctiva is not injected. Right conjunctiva has no hemorrhage. Left conjunctiva is not injected. Left conjunctiva has no hemorrhage. No scleral icterus. Right eye exhibits normal extraocular motion and no nystagmus. Left eye exhibits normal extraocular motion and no nystagmus. Right pupil is round and reactive. Left pupil is round and reactive. Pupils are equal.  Neck: Trachea normal and normal range of motion. Neck supple. No tracheal tenderness, no spinous process tenderness and no muscular tenderness present. No neck rigidity. No tracheal deviation, no edema, no erythema and normal range of motion present. No thyroid mass and no thyromegaly present.  Cardiovascular: Normal rate, regular rhythm, S1 normal, S2 normal, normal heart sounds and intact distal pulses.  PMI is not displaced.  Exam reveals no gallop and no friction rub.   No murmur heard. Pulmonary/Chest: Effort normal and breath sounds normal. No accessory muscle usage or stridor. No respiratory distress. She has no decreased breath sounds. She has no wheezes. She has no rhonchi. She has no rales. She exhibits no tenderness.  Rare intermittent nonproductive cough in exam room; spoke full sentences without difficulty  Abdominal: She exhibits no distension. There is no guarding.  Musculoskeletal: Normal range of motion. She exhibits no edema or tenderness.       Right shoulder: Normal.       Left shoulder: Normal.       Right hip: Normal.       Left hip: Normal.       Right knee: Normal.       Left knee: Normal.       Cervical back: Normal.       Right hand: Normal.       Left hand: Normal.  Lymphadenopathy:       Head (right side): No submental, no submandibular, no tonsillar, no preauricular, no posterior  auricular and no occipital adenopathy present.       Head (left side): No submental, no submandibular, no tonsillar, no preauricular, no posterior auricular and no occipital adenopathy present.    She has no cervical adenopathy.       Right cervical: No superficial cervical, no deep cervical and no posterior cervical adenopathy present.      Left cervical: No superficial cervical, no deep cervical and no posterior cervical adenopathy present.  Neurological: She is alert and oriented to person, place, and time. She has normal strength. She is not disoriented. She displays no atrophy and no tremor. No cranial nerve deficit or sensory deficit. She exhibits normal muscle tone. She displays no seizure activity. Coordination and gait normal. GCS eye subscore is 4. GCS verbal subscore is 5. GCS motor subscore is 6.  Skin: Skin is warm, dry and intact. No abrasion, no bruising, no burn, no ecchymosis, no laceration, no lesion, no petechiae and no rash noted. She is not diaphoretic. No cyanosis or erythema. No pallor. Nails show no clubbing.  Psychiatric: She has a normal mood and affect. Her speech is normal and behavior is normal. Judgment and thought content normal. Cognition and memory are normal.  Nursing note and vitals reviewed.         Assessment & Plan:  A-acute otitis media left; acute bronchitis, acute rhinosinusitis, morbid obesity  P-start flonase 1 spray each nostril BID, continue azelastine 1 spray each nostril consider BID at home, start saline 2 sprays each nostril q2h wa prn congestion.  If no improvement with 48 hours of saline and flonase use start augmentin 875mg   po BID x 10 days #20 RF0.  Electronic Rx given.  Discussed protonix can decrease absorption of augmentin and if do not need daily to use protonix prn while taking augmentin.  Rx diflucan 150mg  po x1 onset symptoms vaginal yeast post antibiotics #1 RF0  May continue sudafed 30mg  po q4-6h max 8 tabs in 24 hours; caution may cause  drowsiness.  Denied personal or family history of ENT cancer.  Shower BID especially prior to bed. No evidence of systemic bacterial infection, non toxic and well hydrated.  I do not see where any further testing or imaging is necessary at this time.   I will suggest supportive care, rest, good hygiene and encourage the patient to take adequate fluids.  The patient is to return to clinic or EMERGENCY ROOM if symptoms worsen or change significantly.  Exitcare handout on sinusitis and sinus rinse given to patient.  Patient verbalized agreement and understanding of treatment plan and had no further questions at this time.   P2:  Hand washing and cover cough  Rx augmentin 875mg  po BID x 10 days #20 RF0 electronic.  Supportive treatment.   No evidence of invasive bacterial infection, non toxic and well hydrated.  This is most likely self limiting viral infection.  I do not see where any further testing or imaging is necessary at this time.   I will suggest supportive care, rest, good hygiene and encourage the patient to take adequate fluids.  The patient is to return to clinic or EMERGENCY ROOM if symptoms worsen or change significantly e.g. ear pain, fever, purulent discharge from ears or bleeding.  Exitcare handout on otitis media given to patient.  Patient verbalized agreement and understanding of treatment plan.    Rx prednisone 30mg  po daily with breakfast (20mg ) take 1 1/2 tabs #5 RF0 for 3 days.  Hold flu vaccination for at least 4 days post prednisone use 1/2 life 36 hours. Cough lozenges po q2h prn cough. Discussed possible side effects increased/decreased appetite, difficulty sleeping, increased blood sugar, increased blood pressure and heart rate. . Bronchitis simple, community acquired, may have started as viral (probably respiratory syncytial, parainfluenza, influenza, or adenovirus), but now evidence of acute purulent bronchitis with resultant bronchial edema and mucus formation.  Viruses are the most  common cause of bronchial inflammation in otherwise healthy adults with acute bronchitis.  The appearance of sputum is not predictive of whether a bacterial infection is present.  Purulent sputum is most often caused by viral infections.  There are a small portion of those caused by non-viral agents being Mycoplama pneumonia.  Microscopic examination or C&S of sputum in the healthy adult with acute bronchitis is generally not helpful (usually negative or normal respiratory flora) other considerations being cough from upper respiratory tract infections, sinusitis or allergic syndromes (mild asthma or viral pneumonia).  Differential Diagnoses:  reactive airway disease (asthma, allergic aspergillosis (eosinophilia), chronic bronchitis, respiratory infection (sinusitis, common cold, pneumonia), congestive heart failure, reflux esophagitis, bronchogenic tumor, aspiration syndromes and/or exposure to pulmonary irritants/smoke.  Without high fever, severe dyspnea, lack of physical findings or other risk factors, I will hold on a chest radiograph and CBC at this time.  I discussed that approximately 50% of patients with acute bronchitis have a cough that lasts up to three weeks, and 25% for over a month.  Tylenol 500mg  one to two tablets every four to six hours as needed for fever or myalgias.  No aspirin. Exitcare handout on bronchitis given to patient.  ER  if hemopthysis, SOB, worst chest pain of life.   Patient instructed to follow up in one week or sooner if symptoms worsen.  Patient verbalized agreement and understanding of treatment plan.  P2:  hand washing and cover cough  Recommend weight loss, exercise 150 minutes per week.

## 2017-03-25 ENCOUNTER — Telehealth: Payer: Self-pay | Admitting: Urology

## 2017-03-25 NOTE — Telephone Encounter (Signed)
Ok just put the order in and I will send her a message  Latoya Lutz

## 2017-03-25 NOTE — Telephone Encounter (Signed)
Patient called asking if she could have litho before the end of the year? She has met her deductible and wants to get this done. Can you please call her and let her know?  [name]

## 2017-03-25 NOTE — Telephone Encounter (Signed)
We will need to get a CT Renal stone study prior to possible ESWL.

## 2017-03-29 ENCOUNTER — Encounter: Payer: Self-pay | Admitting: Family Medicine

## 2017-04-01 ENCOUNTER — Other Ambulatory Visit: Payer: Self-pay

## 2017-04-01 DIAGNOSIS — N200 Calculus of kidney: Secondary | ICD-10-CM

## 2017-04-02 NOTE — Progress Notes (Signed)
1:48 PM   [place] 26-Oct-1970 [id number]  Referring provider: Valerie Roys, DO [place], [place] [id number]  Chief Complaint  Patient presents with  . Follow-up    uretheral dilation     HPI: Patient  is a 46 y/o Caucasian female with a h/o urethral stricture who presents today for a urethral dilation.   Background history She was referred to [place] in 2013 for increased frequency, increased urgency and feelings of not emptying her bladder by Dr. Ronette Deter, M.D.  Per patient, she was diagnosed by Dr. Madelin Headings with urethral dilation and had been receiving serial dilations every 4 to 6 months since 2013.    She also has a history of right ureteral stones treated with ESWL and right URS/LL/right ureteral stent placement and stent removal in winter 2016.  A renal ultrasound completed on 07/13/2015 did not note any hydronephrosis or nephrolithiasis. KUB On 08/22/2015 noted probable interval passage of distal left ureteral calculi noted on prior KUB and a question of a small left renal calculus.  KUB on 11/09/2016 noted a 5 mm calcification projects over the lower pole of the left renal shadow consistent with nephrolithiasis.  I have independently reviewed the films.  She has not had any flank pain or gross hematuria.    Today, patient presents requesting urethral dilatation. She is experiencing nocturia.  Her UA is positive for many bacteria.  She is not experiencing any gross hematuria, dysuria or suprapubic pain. She has not had any flank pain. She is on any fevers, chills, nausea or vomiting.   PMH: Past Medical History:  Diagnosis Date  . Acid reflux   . Arthritis   . Candidal dermatitis 05/25/2014  . Cervical spine disease   . Chicken pox   . Chronic urethral narrowing    undergoing stretching  . Complication of anesthesia   . Endometriosis    Dr. Rushie Chestnut at [facility], removed   . Esophageal reflux 05/25/2014  . Gross hematuria 04/25/2015  . H/O cystoscopy    normal  . Heart murmur   . Infection of urinary tract 09/11/2014  . Kidney stone 09/11/2014  . Kidney stones   . Microscopic hematuria 09/11/2014  . Obesity, morbid, BMI 40.0-49.9 ([place]) 04/10/2012  . PONV (postoperative nausea and vomiting)   . Right ureteral stone 04/28/2015  . Skin cancer   . Urinary retention     Surgical History: Past Surgical History:  Procedure Laterality Date  . ABDOMINAL HYSTERECTOMY     [facility] complete  . BLADDER SURGERY  07/2003  . CARPAL TUNNEL RELEASE Right   . CESAREAN SECTION  1998  . CHOLECYSTECTOMY    . CYSTOSCOPY W/ RETROGRADES Right 05/16/2015   Procedure: CYSTOSCOPY WITH RETROGRADE PYELOGRAM;  Surgeon: Nickie Retort, MD;  Location: [facility];  Service: Urology;  Laterality: Right;  . CYSTOSCOPY W/ URETERAL STENT PLACEMENT Right 05/16/2015   Procedure: CYSTOSCOPY WITH STENT REPLACEMENT;  Surgeon: Nickie Retort, MD;  Location: [facility];  Service: Urology;  Laterality: Right;  . CYSTOSCOPY WITH STENT PLACEMENT Right 04/29/2015   Procedure: CYSTOSCOPY WITH STENT PLACEMENT;  Surgeon: Nickie Retort, MD;  Location: [facility];  Service: Urology;  Laterality: Right;  . EXTRACORPOREAL SHOCK WAVE LITHOTRIPSY Left 06/02/2015   Procedure: EXTRACORPOREAL SHOCK WAVE LITHOTRIPSY (ESWL);  Surgeon: Hollice Espy, MD;  Location: [facility];  Service: Urology;  Laterality: Left;  . FOOT SURGERY  2003  . KNEE SURGERY  2005  . TONSILLECTOMY    .  TONSILLECTOMY     lingual growth on vocal cord removal  . URETEROSCOPY WITH HOLMIUM LASER LITHOTRIPSY Right 05/16/2015   Procedure: URETEROSCOPY WITH HOLMIUM LASER LITHOTRIPSY;  Surgeon: Nickie Retort, MD;  Location: [facility];  Service: Urology;  Laterality: Right;    Home Medications:  Allergies as of 04/04/2017      Reactions   Atorvastatin Other (See Comments)   Leg cramps      Medication List       Accurate as of 04/04/17  1:48 PM. Always use your most recent med list.          acyclovir 400 MG  tablet Commonly known as:  ZOVIRAX Take 1 tablet (400 mg total) by mouth 3 (three) times daily.   azelastine 0.1 % nasal spray Commonly known as:  ASTELIN Place 1 spray into both nostrils 2 (two) times daily.   busPIRone 10 MG tablet Commonly known as:  BUSPAR TAKE 1 TABLET BY MOUTH 3 TIMES DAILY   CALCIUM 600 PO Take 1 tablet by mouth daily.   dextromethorphan-guaiFENesin 30-600 MG 12hr tablet Commonly known as:  MUCINEX DM Take 1 tablet by mouth 2 (two) times daily.   fluticasone 50 MCG/ACT nasal spray Commonly known as:  FLONASE Place 2 sprays into both nostrils daily.   meloxicam 7.5 MG tablet Commonly known as:  MOBIC Take 1 tablet (7.5 mg total) by mouth as needed. Reported on 11/04/2015   MENOPAUSE FORMULA Tabs Take by mouth. Reported on 11/04/2015   pantoprazole 40 MG tablet Commonly known as:  PROTONIX Take 1 tablet (40 mg total) by mouth daily.   pravastatin 20 MG tablet Commonly known as:  PRAVACHOL TAKE 1 TABLET BY MOUTH DAILY   pseudoephedrine 30 MG tablet Commonly known as:  SUDAFED Take 30 mg by mouth 3 (three) times daily.   Simethicone 180 MG Caps Take 1 capsule (180 mg total) by mouth 3 (three) times daily with meals.   sodium chloride 0.65 % Soln nasal spray Commonly known as:  OCEAN Place 2 sprays into both nostrils every 2 (two) hours while awake.   triamcinolone cream 0.1 % Commonly known as:  KENALOG Apply 1 application topically 2 (two) times daily as needed.   Vitamin D (Cholecalciferol) 1000 units Tabs Take by mouth.   vitamin E 400 UNIT capsule Take 400 Units by mouth daily.       Allergies:  Allergies  Allergen Reactions  . Atorvastatin Other (See Comments)    Leg cramps    Family History: Family History  Problem Relation Age of Onset  . Heart disease Mother        Pacemaker and defib  . Hypertension Mother   . Hyperlipidemia Mother   . Cancer Father   . COPD Father        Lung and brain cancer  . Cancer Maternal  Grandmother        Colon  . Diabetes Paternal Grandmother   . Cirrhosis Paternal Grandmother   . Cancer Paternal Grandmother   . Kidney disease Neg Hx   . Bladder Cancer Neg Hx   . Breast cancer Neg Hx     Social History:  reports that she quit smoking about 8 years ago. Her smoking use included Cigarettes. She has a 8.50 pack-year smoking history. She has never used smokeless tobacco. She reports that she does not drink alcohol or use drugs.  ROS: Urological Symptom Review UROLOGY Frequent Urination?: No Hard to postpone urination?: No Burning/pain with urination?: No  Get up at night to urinate?: Yes Leakage of urine?: No Urine stream starts and stops?: No Trouble starting stream?: No Do you have to strain to urinate?: No Blood in urine?: No Urinary tract infection?: No Sexually transmitted disease?: No Injury to kidneys or bladder?: No Painful intercourse?: No Weak stream?: No Currently pregnant?: No Vaginal bleeding?: No Last menstrual period?: n Gastrointestinal Nausea?: No Vomiting?: No Indigestion/heartburn?: No Diarrhea?: No Constipation?: No Constitutional Fever: No Night sweats?: No Weight loss?: No Fatigue?: No Skin Skin rash/lesions?: No Itching?: No Eyes Blurred vision?: No Double vision?: No Ears/Nose/Throat Sore throat?: No Sinus problems?: No Hematologic/Lymphatic Swollen glands?: No Easy bruising?: No Cardiovascular Leg swelling?: No Chest pain?: No Respiratory Cough?: No Shortness of breath?: No Endocrine Excessive thirst?: No Musculoskeletal Back pain?: No Joint pain?: No Neurological Headaches?: No Dizziness?: No Psychologic Depression?: No Anxiety?: No UROLOGY Frequent Urination?: No Hard to postpone urination?: No Burning/pain with urination?: No Get up at night to urinate?: Yes Leakage of urine?: No Urine stream starts and stops?: No Trouble starting stream?: No Do you have to strain to urinate?: No Blood in  urine?: No Urinary tract infection?: No Sexually transmitted disease?: No Injury to kidneys or bladder?: No Painful intercourse?: No Weak stream?: No Currently pregnant?: No Vaginal bleeding?: No Last menstrual period?: n Gastrointestinal Nausea?: No Vomiting?: No Indigestion/heartburn?: No Diarrhea?: No Constipation?: No Constitutional Fever: No Night sweats?: No Weight loss?: No Fatigue?: No Skin Skin rash/lesions?: No Itching?: No Eyes Blurred vision?: No Double vision?: No Ears/Nose/Throat Sore throat?: No Sinus problems?: No Hematologic/Lymphatic Swollen glands?: No Easy bruising?: No Cardiovascular Leg swelling?: No Chest pain?: No Respiratory Cough?: No Shortness of breath?: No Endocrine Excessive thirst?: No Musculoskeletal Back pain?: No Joint pain?: No Neurological Headaches?: No Dizziness?: No Psychologic Depression?: No Anxiety?: No     Physical Exam: Blood pressure (!) 147/81, pulse 81, height 5\' 6"  (1.676 m), weight 283 lb 14.4 oz (128.8 kg). Constitutional: Well nourished. Alert and oriented, No acute distress. HEENT: [place] AT, moist mucus membranes. Trachea midline, no masses. Cardiovascular: No clubbing, cyanosis, or edema. Respiratory: Normal respiratory effort, no increased work of breathing. GI: Abdomen is soft, non tender, non distended, no abdominal masses. Liver and spleen not palpable.  No hernias appreciated.  Stool sample for occult testing is not indicated.   GU: No CVA tenderness.  No bladder fullness or masses.  Normal external genitalia, normal pubic hair distribution, no lesions.  Normal urethral meatus, no lesions, no prolapse, no discharge.   No urethral masses, tenderness and/or tenderness. No bladder fullness, tenderness or masses. Normal vagina mucosa, good estrogen effect, no discharge, no lesions, good pelvic support, no cystocele or rectocele noted.  Cervix, uterus and adnexa are surgically absent.    Anus and perineum  are without rashes or lesions.    Skin: No rashes, bruises or suspicious lesions. Lymph: No cervical or inguinal adenopathy. Neurologic: Grossly intact, no focal deficits, moving all 4 extremities. Psychiatric: Normal mood and affect.   Laboratory Data: Urinalysis Many bacteria.   See EPIC.   Pertinent imaging CLINICAL DATA:  Nephrolithiasis  EXAM: ABDOMEN - 1 VIEW  COMPARISON:  CT from 04/27/2015  FINDINGS: The bowel gas pattern is normal. A 5 mm calculus projects over the lower pole of the left renal shadow similar to CT findings. No right-sided renal calculi are apparent. Stable tiny phlebolith in the left hemipelvis. No organomegaly. Lower lumbar degenerative facet arthropathy L4 through S1. Cholecystectomy clips are seen in the right upper quadrant.  IMPRESSION: A 5 mm calcification projects over the lower pole of the left renal shadow consistent with nephrolithiasis.   Electronically Signed   By: Ashley Royalty M.D.   On: 11/09/2016 18:57  Procedure:   Patient is placed in stirrups and her urethral meatus and vulva are cleansed with Betadine.  2% Lidocaine jelly was inserted into her urethra.  I then dilated her with Elby Showers sounds to a 6f without difficultly.  She tolerated the procedure well.  She will return in 4 months.  Assessment & Plan:    1. 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  Her UA was negative for microscopic hematuria.  She will follow up in four months time for next dilation.    - patient given Macrobid and Diflucan post procedurally   - Urinalysis, Complete  2. Left renal stone    Patient status post ESWL and right ureteroscopy for right ureteral stones. Recent renal ultrasound did not note any nephrolithiasis or hydronephrosis.    Recent KUB was positive for a 5 mm LLP stone.    CT is pending.  Her UA is negative for microscopic hematuria.     Return in about 4 months (around 08/05/2017) for urethral  dialation - needs 30 minutes.  Zara Council, [facility] [address], [place], [place] [address] [phone]

## 2017-04-04 ENCOUNTER — Ambulatory Visit (INDEPENDENT_AMBULATORY_CARE_PROVIDER_SITE_OTHER): Payer: 59 | Admitting: Urology

## 2017-04-04 ENCOUNTER — Encounter: Payer: Self-pay | Admitting: Urology

## 2017-04-04 VITALS — BP 138/81 | HR 74 | Ht 65.0 in | Wt 280.0 lb

## 2017-04-04 DIAGNOSIS — N3592 Unspecified urethral stricture, female: Secondary | ICD-10-CM

## 2017-04-04 DIAGNOSIS — N200 Calculus of kidney: Secondary | ICD-10-CM

## 2017-04-04 MED ORDER — FLUCONAZOLE 150 MG PO TABS: 150.0000 mg | ORAL_TABLET | Freq: Once | ORAL | 0 refills | Status: AC

## 2017-04-04 MED ORDER — NITROFURANTOIN MONOHYD MACRO 100 MG PO CAPS: 100.0000 mg | ORAL_CAPSULE | Freq: Two times a day (BID) | ORAL | 0 refills | Status: DC

## 2017-04-05 ENCOUNTER — Ambulatory Visit
Admission: RE | Admit: 2017-04-05 | Discharge: 2017-04-05 | Disposition: A | Discharge status: Home / Self Care | Payer: 59 | Source: Ambulatory Visit | Attending: Urology | Admitting: Urology

## 2017-04-05 DIAGNOSIS — Z87442 Personal history of urinary calculi: Secondary | ICD-10-CM | POA: Insufficient documentation

## 2017-04-05 DIAGNOSIS — N200 Calculus of kidney: Secondary | ICD-10-CM

## 2017-04-05 DIAGNOSIS — Z09 Encounter for follow-up examination after completed treatment for conditions other than malignant neoplasm: Secondary | ICD-10-CM | POA: Insufficient documentation

## 2017-04-05 DIAGNOSIS — K760 Fatty (change of) liver, not elsewhere classified: Secondary | ICD-10-CM | POA: Diagnosis not present

## 2017-04-05 LAB — URINALYSIS, COMPLETE
Bilirubin, UA: NEGATIVE
Glucose, UA: NEGATIVE
Ketones, UA: NEGATIVE
NITRITE UA: NEGATIVE
Protein, UA: NEGATIVE
UUROB: 0.2 mg/dL (ref 0.2–1.0)
pH, UA: 5.5 (ref 5.0–7.5)

## 2017-04-05 LAB — MICROSCOPIC EXAMINATION: RBC MICROSCOPIC, UA: NONE SEEN /HPF (ref 0–?)

## 2017-04-08 ENCOUNTER — Telehealth: Payer: Self-pay

## 2017-04-08 ENCOUNTER — Encounter: Payer: Self-pay | Admitting: Urology

## 2017-04-08 NOTE — Telephone Encounter (Signed)
Pt made aware via my chart

## 2017-04-08 NOTE — Telephone Encounter (Signed)
-----   Message from Nori Riis, PA-C sent at 04/07/2017  8:23 PM EDT ----- Please let [name] know that her CT scan did not show any stones.

## 2017-04-15 DIAGNOSIS — Z85828 Personal history of other malignant neoplasm of skin: Secondary | ICD-10-CM | POA: Diagnosis not present

## 2017-04-15 DIAGNOSIS — D2371 Other benign neoplasm of skin of right lower limb, including hip: Secondary | ICD-10-CM | POA: Diagnosis not present

## 2017-04-15 DIAGNOSIS — L700 Acne vulgaris: Secondary | ICD-10-CM | POA: Diagnosis not present

## 2017-04-15 DIAGNOSIS — D1801 Hemangioma of skin and subcutaneous tissue: Secondary | ICD-10-CM | POA: Diagnosis not present

## 2017-04-15 DIAGNOSIS — L578 Other skin changes due to chronic exposure to nonionizing radiation: Secondary | ICD-10-CM | POA: Diagnosis not present

## 2017-04-15 DIAGNOSIS — Z1283 Encounter for screening for malignant neoplasm of skin: Secondary | ICD-10-CM | POA: Diagnosis not present

## 2017-04-15 DIAGNOSIS — D229 Melanocytic nevi, unspecified: Secondary | ICD-10-CM | POA: Diagnosis not present

## 2017-04-15 DIAGNOSIS — L812 Freckles: Secondary | ICD-10-CM | POA: Diagnosis not present

## 2017-04-15 DIAGNOSIS — D485 Neoplasm of uncertain behavior of skin: Secondary | ICD-10-CM | POA: Diagnosis not present

## 2017-04-19 ENCOUNTER — Ambulatory Visit: Payer: 59 | Admitting: Family Medicine

## 2017-04-19 ENCOUNTER — Ambulatory Visit (INDEPENDENT_AMBULATORY_CARE_PROVIDER_SITE_OTHER): Payer: 59 | Admitting: Family Medicine

## 2017-04-19 ENCOUNTER — Encounter: Payer: Self-pay | Admitting: Family Medicine

## 2017-04-19 ENCOUNTER — Other Ambulatory Visit: Payer: Self-pay | Admitting: Family Medicine

## 2017-04-19 ENCOUNTER — Ambulatory Visit
Admission: RE | Admit: 2017-04-19 | Discharge: 2017-04-19 | Disposition: A | Discharge status: Home / Self Care | Payer: 59 | Source: Ambulatory Visit | Attending: Family Medicine | Admitting: Family Medicine

## 2017-04-19 VITALS — BP 114/79 | HR 63 | Temp 98.0°F | Ht 64.0 in | Wt 291.2 lb

## 2017-04-19 DIAGNOSIS — M79661 Pain in right lower leg: Secondary | ICD-10-CM | POA: Insufficient documentation

## 2017-04-19 MED ORDER — CYCLOBENZAPRINE HCL 10 MG PO TABS: 10.0000 mg | ORAL_TABLET | Freq: Three times a day (TID) | ORAL | 0 refills | Status: DC | PRN

## 2017-04-19 NOTE — Patient Instructions (Addendum)
Take tylenol (up to 1000 mg) and a full strength aspirin in the morning, can repeat tylenol up to 3 more times daily and can add ibuprofen in the evenings

## 2017-04-19 NOTE — Progress Notes (Signed)
BP 114/79 (BP Location: Right Arm, Patient Position: Sitting, Cuff Size: Large)   Pulse 63   Temp 98 F (36.7 C) (Oral)   Ht 5\' 4"  (1.626 m)   Wt 291 lb 3.2 oz (132.1 kg)   SpO2 98%   BMI 49.98 kg/m    Subjective:    Patient ID: Latoya Lutz, female    DOB: 05/12/1971, 46 y.o.   MRN: [id number]  HPI: [name] is a 46 y.o. female  Chief Complaint  Patient presents with  . Leg Pain    Cramp started Wednesday. Drank water, took tylenol. Patient woke up this morning and couldn't put weight on her leg. Calf cramp.    Patient presents with significant right calf pain. Started with muscle cramping in the area 2 days ago that has waxed and waned, but woke this morning with strong calf pain in specific area. Cannot flex foot this morning without severe pain. No redness, CP, palpitations, SOB, cough. No hx of blood clots, recent travel, known clotting disorders, recent smoking hx. Her mother has her concerned about a blood clot.   Relevant past medical, surgical, family and social history reviewed and updated as indicated. Interim medical history since our last visit reviewed. Allergies and medications reviewed and updated.  Review of Systems  Constitutional: Negative.   Respiratory: Negative.   Cardiovascular: Negative.   Gastrointestinal: Negative.   Musculoskeletal: Positive for myalgias.  Skin: Negative for color change.  Neurological: Negative.   Psychiatric/Behavioral: Negative.    Per HPI unless specifically indicated above     Objective:    BP 114/79 (BP Location: Right Arm, Patient Position: Sitting, Cuff Size: Large)   Pulse 63   Temp 98 F (36.7 C) (Oral)   Ht 5\' 4"  (1.626 m)   Wt 291 lb 3.2 oz (132.1 kg)   SpO2 98%   BMI 49.98 kg/m   Wt Readings from Last 3 Encounters:  04/19/17 291 lb 3.2 oz (132.1 kg)  04/04/17 280 lb (127 kg)  11/29/16 283 lb 14.4 oz (128.8 kg)    Physical Exam  Constitutional: She is oriented to person, place, and  time. She appears well-developed and well-nourished.  HENT:  Head: Atraumatic.  Eyes: Conjunctivae are normal. Pupils are equal, round, and reactive to light.  Neck: Normal range of motion. Neck supple.  Cardiovascular: Normal rate and normal heart sounds.  Pulmonary/Chest: Effort normal and breath sounds normal. No respiratory distress.  Musculoskeletal: She exhibits tenderness (Significant TTP right mid-calf, + homan's sign). She exhibits no edema (No RLE edema).  Neurological: She is alert and oriented to person, place, and time.  Skin: Skin is warm and dry. No erythema.  Psychiatric: She has a normal mood and affect. Her behavior is normal.  Nursing note and vitals reviewed.     Assessment & Plan:   Problem List Items Addressed This Visit    None    Visit Diagnoses    Right calf pain    -  Primary   Relevant Orders   VAS [place] LOWER EXTREMITY VENOUS (DVT)   US Venous Img Lower Unilateral Right (Completed)    Suspect pain is residual from severe leg cramping, but cannot r/o new DVT given localization of pain. Pt very concerned, and is about to go on a road trip for the weekend. Will order STAT DVT r/o u/s. ASA, tylenol, heat, massage, stretches, flexeril prn. Await results.   Follow up plan: Return if symptoms worsen or fail to improve.

## 2017-04-22 ENCOUNTER — Encounter: Payer: Self-pay | Admitting: Urology

## 2017-05-14 ENCOUNTER — Encounter: Payer: Self-pay | Admitting: Family Medicine

## 2017-05-27 ENCOUNTER — Encounter: Payer: Self-pay | Admitting: Family Medicine

## 2017-05-27 ENCOUNTER — Ambulatory Visit
Admission: RE | Admit: 2017-05-27 | Discharge: 2017-05-27 | Disposition: A | Discharge status: Home / Self Care | Payer: 59 | Source: Ambulatory Visit | Attending: Family Medicine | Admitting: Family Medicine

## 2017-05-27 ENCOUNTER — Telehealth: Payer: Self-pay | Admitting: Family Medicine

## 2017-05-27 ENCOUNTER — Ambulatory Visit (INDEPENDENT_AMBULATORY_CARE_PROVIDER_SITE_OTHER): Payer: 59 | Admitting: Family Medicine

## 2017-05-27 VITALS — BP 138/80 | HR 73 | Temp 98.3°F | Wt 289.8 lb

## 2017-05-27 DIAGNOSIS — K219 Gastro-esophageal reflux disease without esophagitis: Secondary | ICD-10-CM | POA: Diagnosis not present

## 2017-05-27 DIAGNOSIS — M25872 Other specified joint disorders, left ankle and foot: Secondary | ICD-10-CM | POA: Insufficient documentation

## 2017-05-27 DIAGNOSIS — M25572 Pain in left ankle and joints of left foot: Secondary | ICD-10-CM

## 2017-05-27 DIAGNOSIS — Z1239 Encounter for other screening for malignant neoplasm of breast: Secondary | ICD-10-CM

## 2017-05-27 DIAGNOSIS — M7989 Other specified soft tissue disorders: Secondary | ICD-10-CM | POA: Diagnosis not present

## 2017-05-27 MED ORDER — PANTOPRAZOLE SODIUM 40 MG PO TBEC: 40.0000 mg | DELAYED_RELEASE_TABLET | Freq: Every day | ORAL | 1 refills | Status: DC

## 2017-05-27 MED ORDER — MELOXICAM 15 MG PO TABS: 15.0000 mg | ORAL_TABLET | Freq: Every day | ORAL | 0 refills | Status: DC

## 2017-05-27 MED ORDER — PRAVASTATIN SODIUM 20 MG PO TABS: 20.0000 mg | ORAL_TABLET | Freq: Every day | ORAL | 1 refills | Status: DC

## 2017-05-27 NOTE — Progress Notes (Signed)
BP 138/80 (BP Location: Right Arm, Patient Position: Sitting, Cuff Size: Large)   Pulse 73   Temp 98.3 F (36.8 C) (Oral)   Wt 289 lb 12.8 oz (131.5 kg)   SpO2 96%   BMI 49.74 kg/m    Subjective:    Patient ID: Latoya Lutz, female    DOB: May 13, 1971, 46 y.o.   MRN: [id number]  HPI: Latoya Lutz is a 46 y.o. female  Chief Complaint  Patient presents with  . Ankle Pain    Left ankle pain and swelling. No cause patient states. x's 1.5 weeks.   Patient presents with left ankle pain and swelling x 1-2 weeks. No known trauma to area, redness, bruising, heat. Pain has remained consistent throughout duration, worse with weight bearing. Has not been trying anything OTC for sxs.   Past Medical History:  Diagnosis Date  . Acid reflux   . Arthritis   . Candidal dermatitis 05/25/2014  . Cervical spine disease   . Chicken pox   . Chronic urethral narrowing    undergoing stretching  . Complication of anesthesia   . Endometriosis    Dr. Rushie Chestnut at [facility], removed   . Esophageal reflux 05/25/2014  . Gross hematuria 04/25/2015  . H/O cystoscopy    normal  . Heart murmur   . Infection of urinary tract 09/11/2014  . Kidney stone 09/11/2014  . Kidney stones   . Microscopic hematuria 09/11/2014  . Obesity, morbid, BMI 40.0-49.9 ([place]) 04/10/2012  . PONV (postoperative nausea and vomiting)   . Right ureteral stone 04/28/2015  . Skin cancer   . Urinary retention    Social History   Socioeconomic History  . Marital status: Married    Spouse name: Not on file  . Number of children: Not on file  . Years of education: Not on file  . Highest education level: Not on file  Social Needs  . Financial resource strain: Not on file  . Food insecurity - worry: Not on file  . Food insecurity - inability: Not on file  . Transportation needs - medical: Not on file  . Transportation needs - non-medical: Not on file  Occupational History  . Not on file  Tobacco Use  . Smoking status:  Former Smoker    Packs/day: 0.50    Years: 17.00    Pack years: 8.50    Types: Cigarettes    Last attempt to quit: 04/27/2008    Years since quitting: 9.0  . Smokeless tobacco: Never Used  . Tobacco comment: quit 7 years   Substance and Sexual Activity  . Alcohol use: No    Alcohol/week: 0.0 oz  . Drug use: No  . Sexual activity: Yes  Other Topics Concern  . Not on file  Social History Narrative   Lives in [place] with son 15YO and fiance      Work - [employer]    Relevant past medical, surgical, family and social history reviewed and updated as indicated. Interim medical history since our last visit reviewed. Allergies and medications reviewed and updated.  Review of Systems  Constitutional: Negative.   Respiratory: Negative.   Cardiovascular: Negative.   Gastrointestinal: Negative.   Genitourinary: Negative.   Musculoskeletal: Positive for arthralgias and joint swelling.  Neurological: Negative.   Psychiatric/Behavioral: Negative.     Per HPI unless specifically indicated above     Objective:    BP 138/80 (BP Location: Right Arm, Patient Position: Sitting, Cuff Size: Large)   Pulse 73  Temp 98.3 F (36.8 C) (Oral)   Wt 289 lb 12.8 oz (131.5 kg)   SpO2 96%   BMI 49.74 kg/m   Wt Readings from Last 3 Encounters:  05/27/17 289 lb 12.8 oz (131.5 kg)  04/19/17 291 lb 3.2 oz (132.1 kg)  04/04/17 280 lb (127 kg)    Physical Exam  Constitutional: She is oriented to person, place, and time. She appears well-developed and well-nourished. No distress.  HENT:  Head: Atraumatic.  Eyes: Conjunctivae are normal. Pupils are equal, round, and reactive to light.  Neck: Normal range of motion. Neck supple.  Cardiovascular: Normal rate and normal heart sounds.  Pulmonary/Chest: Effort normal and breath sounds normal. No respiratory distress.  Musculoskeletal: Normal range of motion. She exhibits edema (left ankle, diffuse) and tenderness (left ankle, worst laterally). She  exhibits no deformity.  Minimal resistance to PROM of left ankle   Neurological: She is alert and oriented to person, place, and time.  Skin: Skin is warm and dry. No erythema.  Psychiatric: She has a normal mood and affect. Her behavior is normal.  Nursing note and vitals reviewed.  Results for orders placed or performed in visit on 04/04/17  Microscopic Examination  Result Value Ref Range   WBC, UA 0-5 0 - 5 /hpf   RBC, UA None seen 0 - 2 /hpf   Epithelial Cells (non renal) 0-10 0 - 10 /hpf   Mucus, UA Present (A) Not Estab.   Bacteria, UA Many (A) None seen/Few  Urinalysis, Complete  Result Value Ref Range   Specific Gravity, UA >1.030 (H) 1.005 - 1.030   pH, UA 5.5 5.0 - 7.5   Color, UA Yellow Yellow   Appearance Ur Cloudy (A) Clear   Leukocytes, UA 1+ (A) Negative   Protein, UA Negative Negative/Trace   Glucose, UA Negative Negative   Ketones, UA Negative Negative   RBC, UA 1+ (A) Negative   Bilirubin, UA Negative Negative   Urobilinogen, Ur 0.2 0.2 - 1.0 mg/dL   Nitrite, UA Negative Negative   Microscopic Examination See below:       Assessment & Plan:   Problem List Items Addressed This Visit      Digestive   Esophageal reflux    Will take protonix regularly for reflux sxs, particularly while on meloxicam daily for ankle problems for stomach protection. Lifestyle modifications reviewed as well.       Relevant Medications   pantoprazole (PROTONIX) 40 MG tablet    Other Visit Diagnoses    Acute left ankle pain    -  Primary   Low suspicion for fx, but will get x-ray to r/o. Meloxicam, RICE protocol, epsom salt soaks. Follow up if not improving over next few weeks   Relevant Orders   DG Ankle Complete Left (Completed)       Follow up plan: Return if symptoms worsen or fail to improve.

## 2017-05-27 NOTE — Telephone Encounter (Signed)
Pt. Would like to know when her last mammogram was and if she could go ahead and get an order put in for her next mammogram.  Please Advise.  Thank you

## 2017-05-27 NOTE — Telephone Encounter (Signed)
Order placed, message sent to let patient know that she can call and schedule.

## 2017-05-28 ENCOUNTER — Encounter: Payer: Self-pay | Admitting: Family Medicine

## 2017-05-28 ENCOUNTER — Other Ambulatory Visit: Payer: Self-pay | Admitting: Family Medicine

## 2017-05-28 MED ORDER — ACYCLOVIR 400 MG PO TABS: 400.0000 mg | ORAL_TABLET | Freq: Three times a day (TID) | ORAL | 0 refills | Status: DC

## 2017-05-28 NOTE — Telephone Encounter (Signed)
Routing to provider  

## 2017-05-29 NOTE — Patient Instructions (Signed)
Follow up as needed

## 2017-05-29 NOTE — Assessment & Plan Note (Signed)
Will take protonix regularly for reflux sxs, particularly while on meloxicam daily for ankle problems for stomach protection. Lifestyle modifications reviewed as well.

## 2017-07-19 ENCOUNTER — Ambulatory Visit
Admission: RE | Admit: 2017-07-19 | Discharge: 2017-07-19 | Disposition: A | Discharge status: Home / Self Care | Payer: 59 | Source: Ambulatory Visit | Attending: Family Medicine | Admitting: Family Medicine

## 2017-07-19 DIAGNOSIS — Z1231 Encounter for screening mammogram for malignant neoplasm of breast: Secondary | ICD-10-CM | POA: Diagnosis not present

## 2017-07-19 DIAGNOSIS — Z1239 Encounter for other screening for malignant neoplasm of breast: Secondary | ICD-10-CM

## 2017-08-05 ENCOUNTER — Encounter: Payer: Self-pay | Admitting: Urology

## 2017-08-05 ENCOUNTER — Ambulatory Visit (INDEPENDENT_AMBULATORY_CARE_PROVIDER_SITE_OTHER): Payer: 59 | Admitting: Urology

## 2017-08-05 VITALS — BP 158/88 | HR 65 | Ht 60.0 in | Wt 287.0 lb

## 2017-08-05 DIAGNOSIS — N3592 Unspecified urethral stricture, female: Secondary | ICD-10-CM

## 2017-08-05 DIAGNOSIS — Z87442 Personal history of urinary calculi: Secondary | ICD-10-CM

## 2017-08-05 LAB — URINALYSIS, COMPLETE
BILIRUBIN UA: NEGATIVE
GLUCOSE, UA: NEGATIVE
KETONES UA: NEGATIVE
Leukocytes, UA: NEGATIVE
NITRITE UA: NEGATIVE
PROTEIN UA: NEGATIVE
RBC UA: NEGATIVE
UUROB: 0.2 mg/dL (ref 0.2–1.0)
pH, UA: 5.5 (ref 5.0–7.5)

## 2017-08-05 LAB — MICROSCOPIC EXAMINATION
RBC MICROSCOPIC, UA: NONE SEEN /HPF (ref 0–?)
WBC UA: NONE SEEN /HPF (ref 0–?)

## 2017-08-05 NOTE — Progress Notes (Signed)
8:45 AM   [place] 1971/01/16 [id number]  Referring provider: Valerie Roys, DO [place], [place] [address]  Chief Complaint  Patient presents with  . Follow-up    HPI: Patient  is a 47 y/o Caucasian female with a h/o urethral stricture who presents today for a urethral dilation.   Background history She was referred to [place] in 2013 for increased frequency, increased urgency and feelings of not emptying her bladder by Dr. Ronette Deter, M.D.  Per patient, she was diagnosed by Dr. Madelin Headings with urethral dilation and had been receiving serial dilations every 4 to 6 months since 2013.    She also has a history of right ureteral stones treated with ESWL and right URS/LL/right ureteral stent placement and stent removal in winter 2016.  A renal ultrasound completed on 07/13/2015 did not note any hydronephrosis or nephrolithiasis. KUB On 08/22/2015 noted probable interval passage of distal left ureteral calculi noted on prior KUB and a question of a small left renal calculus.  KUB on 11/09/2016 noted a 5 mm calcification projects over the lower pole of the left renal shadow consistent with nephrolithiasis.  CT Renal stone study performed on 04/05/2017 noted no nephroureterolithiasis.  No hydronephrosis.  Hepatic steatosis.    Today, she is complaining of nocturia which is baseline.  She states she passed a stone after her last urethral dilation.  Patient denies any gross hematuria, dysuria or suprapubic/flank pain.  Patient denies any fevers, chills, nausea or vomiting.  Her UA is positive for many bacteria.    PMH: Past Medical History:  Diagnosis Date  . Acid reflux   . Arthritis   . Candidal dermatitis 05/25/2014  . Cervical spine disease   . Chicken pox   . Chronic urethral narrowing    undergoing stretching  . Complication of anesthesia   . Endometriosis    Dr. Rushie Chestnut at [facility], removed   . Esophageal reflux 05/25/2014  . Gross hematuria 04/25/2015  . H/O  cystoscopy    normal  . Heart murmur   . Infection of urinary tract 09/11/2014  . Kidney stone 09/11/2014  . Kidney stones   . Microscopic hematuria 09/11/2014  . Obesity, morbid, BMI 40.0-49.9 ([place]) 04/10/2012  . PONV (postoperative nausea and vomiting)   . Right ureteral stone 04/28/2015  . Skin cancer   . Urinary retention     Surgical History: Past Surgical History:  Procedure Laterality Date  . ABDOMINAL HYSTERECTOMY     [facility] complete  . BLADDER SURGERY  07/2003  . CARPAL TUNNEL RELEASE Right   . CESAREAN SECTION  1998  . CHOLECYSTECTOMY    . CYSTOSCOPY W/ RETROGRADES Right 05/16/2015   Procedure: CYSTOSCOPY WITH RETROGRADE PYELOGRAM;  Surgeon: Nickie Retort, MD;  Location: [facility] ORS;  Service: Urology;  Laterality: Right;  . CYSTOSCOPY W/ URETERAL STENT PLACEMENT Right 05/16/2015   Procedure: CYSTOSCOPY WITH STENT REPLACEMENT;  Surgeon: Nickie Retort, MD;  Location: [facility] OR[facility];  Service: Urology;  Laterality: Right;  . CYSTOSCOPY WITH STENT PLACEMENT Right 04/29/2015   Procedure: CYSTOSCOPY WITH STENT PLACEMENT;  Surgeon: Nickie Retort, MD;  Location: [facility] OR[facility];  Service: Urology;  Laterality: Right;  . EXTRACORPOREAL SHOCK WAVE LITHOTRIPSY Left 06/02/2015   Procedure: EXTRACORPOREAL SHOCK WAVE LITHOTRIPSY (ESWL);  Surgeon: Hollice Espy, MD;  Location: [facility] ORS;  Service: Urology;  Laterality: Left;  . FOOT SURGERY  2003  . KNEE SURGERY  2005  . TONSILLECTOMY    . TONSILLECTOMY  lingual growth on vocal cord removal  . URETEROSCOPY WITH HOLMIUM LASER LITHOTRIPSY Right 05/16/2015   Procedure: URETEROSCOPY WITH HOLMIUM LASER LITHOTRIPSY;  Surgeon: Nickie Retort, MD;  Location: [facility] OR[facility];  Service: Urology;  Laterality: Right;    Home Medications:  Allergies as of 08/05/2017      Reactions   Atorvastatin Other (See Comments)   Leg cramps      Medication List        Accurate as of 08/05/17  8:45 AM. Always use your most recent med list.            acyclovir 400 MG tablet Commonly known as:  ZOVIRAX Take 1 tablet (400 mg total) by mouth 3 (three) times daily.   azelastine 0.1 % nasal spray Commonly known as:  ASTELIN Place 1 spray into both nostrils 2 (two) times daily.   busPIRone 10 MG tablet Commonly known as:  BUSPAR TAKE 1 TABLET BY MOUTH 3 TIMES DAILY   CALCIUM 600 PO Take 1 tablet by mouth daily.   cyclobenzaprine 10 MG tablet Commonly known as:  FLEXERIL Take 1 tablet (10 mg total) 3 (three) times daily as needed by mouth for muscle spasms.   fluticasone 50 MCG/ACT nasal spray Commonly known as:  FLONASE Place 2 sprays into both nostrils daily.   meloxicam 15 MG tablet Commonly known as:  MOBIC Take 1 tablet (15 mg total) by mouth daily.   MENOPAUSE FORMULA Tabs Take by mouth. Reported on 11/04/2015   pantoprazole 40 MG tablet Commonly known as:  PROTONIX Take 1 tablet (40 mg total) by mouth daily.   pravastatin 20 MG tablet Commonly known as:  PRAVACHOL Take 1 tablet (20 mg total) by mouth daily.   pseudoephedrine 30 MG tablet Commonly known as:  SUDAFED Take 30 mg by mouth 3 (three) times daily.   Simethicone 180 MG Caps Take 1 capsule (180 mg total) by mouth 3 (three) times daily with meals.   triamcinolone cream 0.1 % Commonly known as:  KENALOG Apply 1 application topically 2 (two) times daily as needed.   Vitamin D (Cholecalciferol) 1000 units Tabs Take by mouth.   vitamin E 400 UNIT capsule Take 400 Units by mouth daily.       Allergies:  Allergies  Allergen Reactions  . Atorvastatin Other (See Comments)    Leg cramps    Family History: Family History  Problem Relation Age of Onset  . Heart disease Mother        Pacemaker and defib  . Hypertension Mother   . Hyperlipidemia Mother   . Cancer Father   . COPD Father        Lung and brain cancer  . Cancer Maternal Grandmother        Colon  . Diabetes Paternal Grandmother   . Cirrhosis Paternal Grandmother   . Cancer  Paternal Grandmother   . Kidney disease Neg Hx   . Bladder Cancer Neg Hx   . Breast cancer Neg Hx     Social History:  reports that she quit smoking about 9 years ago. Her smoking use included cigarettes. She has a 8.50 pack-year smoking history. she has never used smokeless tobacco. She reports that she does not drink alcohol or use drugs.  ROS: Urological Symptom Review UROLOGY Frequent Urination?: No Hard to postpone urination?: No Burning/pain with urination?: No Get up at night to urinate?: Yes Leakage of urine?: No Urine stream starts and stops?: No Trouble starting stream?: No Do you have  to strain to urinate?: No Blood in urine?: No Urinary tract infection?: No Sexually transmitted disease?: No Injury to kidneys or bladder?: No Painful intercourse?: No Weak stream?: No Currently pregnant?: No Vaginal bleeding?: No Last menstrual period?: n Gastrointestinal Nausea?: No Vomiting?: No Indigestion/heartburn?: No Diarrhea?: No Constipation?: No Constitutional Fever: No Weight loss?: No Fatigue?: No Skin Skin rash/lesions?: No Itching?: No Eyes Blurred vision?: No Double vision?: No Ears/Nose/Throat Sore throat?: No Sinus problems?: No Hematologic/Lymphatic Swollen glands?: No Easy bruising?: No Cardiovascular Leg swelling?: No Chest pain?: No Respiratory Cough?: No Shortness of breath?: No Endocrine Excessive thirst?: No Musculoskeletal Back pain?: No Joint pain?: No Neurological Headaches?: No Dizziness?: No Psychologic Depression?: No Anxiety?: No UROLOGY Frequent Urination?: No Hard to postpone urination?: No Burning/pain with urination?: No Get up at night to urinate?: Yes Leakage of urine?: No Urine stream starts and stops?: No Trouble starting stream?: No Do you have to strain to urinate?: No Blood in urine?: No Urinary tract infection?: No Sexually transmitted disease?: No Injury to kidneys or bladder?: No Painful intercourse?:  No Weak stream?: No Currently pregnant?: No Vaginal bleeding?: No Last menstrual period?: n Gastrointestinal Nausea?: No Vomiting?: No Indigestion/heartburn?: No Diarrhea?: No Constipation?: No Constitutional Fever: No Weight loss?: No Fatigue?: No Skin Skin rash/lesions?: No Itching?: No Eyes Blurred vision?: No Double vision?: No Ears/Nose/Throat Sore throat?: No Sinus problems?: No Hematologic/Lymphatic Swollen glands?: No Easy bruising?: No Cardiovascular Leg swelling?: No Chest pain?: No Respiratory Cough?: No Shortness of breath?: No Endocrine Excessive thirst?: No Musculoskeletal Back pain?: No Joint pain?: No Neurological Headaches?: No Dizziness?: No Psychologic Depression?: No Anxiety?: No   Physical Exam: Blood pressure (!) 158/88, pulse 65, height 5' (1.524 m), weight 287 lb (130.2 kg). Constitutional: Well nourished. Alert and oriented, No acute distress. HEENT: [place] AT, moist mucus membranes. Trachea midline, no masses. Cardiovascular: No clubbing, cyanosis, or edema. Respiratory: Normal respiratory effort, no increased work of breathing. GI: Abdomen is soft, non tender, non distended, no abdominal masses. Liver and spleen not palpable.  No hernias appreciated.  Stool sample for occult testing is not indicated.   GU: No CVA tenderness.  No bladder fullness or masses.  Normal external genitalia, normal pubic hair distribution, no lesions.  Normal urethral meatus, no lesions, no prolapse, no discharge.   No urethral masses, tenderness and/or tenderness. No bladder fullness, tenderness or masses. Normal vagina mucosa, good estrogen effect, no discharge, no lesions, good pelvic support, no cystocele or rectocele noted.  Cervix, uterus and adnexa are surgically absent.  Anus and perineum are without rashes or lesions.    Skin: No rashes, bruises or suspicious lesions. Lymph: No cervical or inguinal adenopathy. Neurologic: Grossly intact, no focal  deficits, moving all 4 extremities. Psychiatric: Normal mood and affect.   Laboratory Data: Urinalysis Many bacteria.   See EPIC.   I have reviewed the labs.  Pertinent imaging N/a  Procedure:   Patient is placed in stirrups and her urethral meatus and vulva are cleansed with Betadine.  2% Lidocaine jelly was inserted into her urethra.  I then dilated her with Elby Showers sounds to a 56f without difficultly.  She tolerated the procedure well.  She will return in 4 months.  Assessment & Plan:    1. 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  Her UA was negative for microscopic hematuria.  She will follow up in four months time for next dilation.    - Urinalysis, Complete    Return in about 4  months (around 12/03/2017) for urethral dilation.  Zara Council, [facility] [address], [place], [place] [address] [phone]

## 2017-08-12 ENCOUNTER — Ambulatory Visit (INDEPENDENT_AMBULATORY_CARE_PROVIDER_SITE_OTHER): Payer: 59 | Admitting: Family Medicine

## 2017-08-12 ENCOUNTER — Encounter: Payer: Self-pay | Admitting: Family Medicine

## 2017-08-12 VITALS — BP 117/74 | HR 62 | Temp 97.8°F | Ht 64.0 in | Wt 288.3 lb

## 2017-08-12 DIAGNOSIS — F419 Anxiety disorder, unspecified: Secondary | ICD-10-CM

## 2017-08-12 DIAGNOSIS — G8929 Other chronic pain: Secondary | ICD-10-CM | POA: Diagnosis not present

## 2017-08-12 DIAGNOSIS — Z0000 Encounter for general adult medical examination without abnormal findings: Secondary | ICD-10-CM | POA: Diagnosis not present

## 2017-08-12 DIAGNOSIS — Z0001 Encounter for general adult medical examination with abnormal findings: Secondary | ICD-10-CM | POA: Diagnosis not present

## 2017-08-12 DIAGNOSIS — K219 Gastro-esophageal reflux disease without esophagitis: Secondary | ICD-10-CM

## 2017-08-12 DIAGNOSIS — E782 Mixed hyperlipidemia: Secondary | ICD-10-CM

## 2017-08-12 DIAGNOSIS — M25572 Pain in left ankle and joints of left foot: Secondary | ICD-10-CM

## 2017-08-12 LAB — UA/M W/RFLX CULTURE, ROUTINE
Bilirubin, UA: NEGATIVE
GLUCOSE, UA: NEGATIVE
Ketones, UA: NEGATIVE
LEUKOCYTES UA: NEGATIVE
Nitrite, UA: NEGATIVE
PROTEIN UA: NEGATIVE
RBC, UA: NEGATIVE
Specific Gravity, UA: 1.025 (ref 1.005–1.030)
Urobilinogen, Ur: 0.2 mg/dL (ref 0.2–1.0)
pH, UA: 5.5 (ref 5.0–7.5)

## 2017-08-12 MED ORDER — MELOXICAM 15 MG PO TABS: 15.0000 mg | ORAL_TABLET | Freq: Every day | ORAL | 3 refills | Status: DC

## 2017-08-12 MED ORDER — PRAVASTATIN SODIUM 20 MG PO TABS: 20.0000 mg | ORAL_TABLET | Freq: Every day | ORAL | 1 refills | Status: DC

## 2017-08-12 MED ORDER — PANTOPRAZOLE SODIUM 40 MG PO TBEC: 40.0000 mg | DELAYED_RELEASE_TABLET | Freq: Every day | ORAL | 1 refills | Status: DC

## 2017-08-12 NOTE — Progress Notes (Signed)
BP 117/74 (BP Location: Left Arm, Patient Position: Sitting, Cuff Size: Large)   Pulse 62   Temp 97.8 F (36.6 C) (Oral)   Ht 5\' 4"  (1.626 m)   Wt 288 lb 4.8 oz (130.8 kg)   SpO2 96%   BMI 49.49 kg/m    Subjective:    Patient ID: Latoya Lutz, female    DOB: September 25, 1970, 47 y.o.   MRN: [id number]  HPI: Latoya Lutz is a 47 y.o. female presenting on 08/12/2017 for comprehensive medical examination. Current medical complaints include:see below  Still having intermittent pain and swelling in left ankle, usually during especially active days. Meloxicam and her ankle brace seem to bring relief after a day or so. Previous x-ray revealed a bone spur at the ankle.   Using buspar prn, mostly for anxiety during car trips. Works very well for her in these situations. Denies side effects. Doing very well on pravastatin and protonix for hyperlipidemia and GERD.   She currently lives with: husband Menopausal Symptoms: no  Depression Screen done today and results listed below:  Depression screen [facility] 2/9 08/12/2017 08/08/2016 03/05/2016 11/14/2015 06/01/2015  Decreased Interest 0 0 0 0 0  Down, Depressed, Hopeless 0 0 0 0 0  PHQ - 2 Score 0 0 0 0 0  Altered sleeping 0 - 0 - -  Tired, decreased energy 0 - 0 - -  Change in appetite 0 - 0 - -  Feeling bad or failure about yourself  0 - 0 - -  Trouble concentrating 0 - 0 - -  Moving slowly or fidgety/restless 0 - 0 - -  Suicidal thoughts 0 - 0 - -  PHQ-9 Score 0 - 0 - -  Difficult doing work/chores Not difficult at all - - - -    The patient does not have a history of falls. I did not complete a risk assessment for falls. A plan of care for falls was not documented.   Past Medical History:  Past Medical History:  Diagnosis Date  . Acid reflux   . Arthritis   . Candidal dermatitis 05/25/2014  . Cervical spine disease   . Chicken pox   . Chronic urethral narrowing    undergoing stretching  . Complication of anesthesia   .  Endometriosis    Dr. Rushie Chestnut at [facility], removed   . Esophageal reflux 05/25/2014  . Gross hematuria 04/25/2015  . H/O cystoscopy    normal  . Heart murmur   . Infection of urinary tract 09/11/2014  . Kidney stone 09/11/2014  . Kidney stones   . Microscopic hematuria 09/11/2014  . Obesity, morbid, BMI 40.0-49.9 ([place]) 04/10/2012  . PONV (postoperative nausea and vomiting)   . Right ureteral stone 04/28/2015  . Skin cancer   . Urinary retention     Surgical History:  Past Surgical History:  Procedure Laterality Date  . ABDOMINAL HYSTERECTOMY     [facility] complete  . BLADDER SURGERY  07/2003  . CARPAL TUNNEL RELEASE Right   . CESAREAN SECTION  1998  . CHOLECYSTECTOMY    . CYSTOSCOPY W/ RETROGRADES Right 05/16/2015   Procedure: CYSTOSCOPY WITH RETROGRADE PYELOGRAM;  Surgeon: Nickie Retort, MD;  Location: [facility] ORS;  Service: Urology;  Laterality: Right;  . CYSTOSCOPY W/ URETERAL STENT PLACEMENT Right 05/16/2015   Procedure: CYSTOSCOPY WITH STENT REPLACEMENT;  Surgeon: Nickie Retort, MD;  Location: [facility] OR[facility];  Service: Urology;  Laterality: Right;  . CYSTOSCOPY WITH STENT PLACEMENT Right 04/29/2015  Procedure: CYSTOSCOPY WITH STENT PLACEMENT;  Surgeon: Nickie Retort, MD;  Location: [facility] OR[facility];  Service: Urology;  Laterality: Right;  . EXTRACORPOREAL SHOCK WAVE LITHOTRIPSY Left 06/02/2015   Procedure: EXTRACORPOREAL SHOCK WAVE LITHOTRIPSY (ESWL);  Surgeon: Hollice Espy, MD;  Location: [facility] ORS;  Service: Urology;  Laterality: Left;  . FOOT SURGERY  2003  . KNEE SURGERY  2005  . TONSILLECTOMY    . TONSILLECTOMY     lingual growth on vocal cord removal  . URETEROSCOPY WITH HOLMIUM LASER LITHOTRIPSY Right 05/16/2015   Procedure: URETEROSCOPY WITH HOLMIUM LASER LITHOTRIPSY;  Surgeon: Nickie Retort, MD;  Location: [facility] OR[facility];  Service: Urology;  Laterality: Right;    Medications:  Current Outpatient Medications on File Prior to Visit  Medication Sig  . acyclovir (ZOVIRAX) 400 MG  tablet Take 1 tablet (400 mg total) by mouth 3 (three) times daily.  Marland Kitchen azelastine (ASTELIN) 0.1 % nasal spray Place 1 spray into both nostrils 2 (two) times daily.  . busPIRone (BUSPAR) 10 MG tablet TAKE 1 TABLET BY MOUTH 3 TIMES DAILY (Patient taking differently: prn)  . Calcium Carbonate (CALCIUM 600 PO) Take 1 tablet by mouth daily.   . cyclobenzaprine (FLEXERIL) 10 MG tablet Take 1 tablet (10 mg total) 3 (three) times daily as needed by mouth for muscle spasms.  . fluticasone (FLONASE) 50 MCG/ACT nasal spray Place 2 sprays into both nostrils daily.  . Nutritional Supplements (MENOPAUSE FORMULA) TABS Take by mouth. Reported on 11/04/2015  . pseudoephedrine (SUDAFED) 30 MG tablet Take 30 mg by mouth 3 (three) times daily.  . Simethicone 180 MG CAPS Take 1 capsule (180 mg total) by mouth 3 (three) times daily with meals.  . triamcinolone cream (KENALOG) 0.1 % Apply 1 application topically 2 (two) times daily as needed.  . Vitamin D, Cholecalciferol, 1000 UNITS TABS Take by mouth.  . vitamin E 400 UNIT capsule Take 400 Units by mouth daily.   No current facility-administered medications on file prior to visit.     Allergies:  Allergies  Allergen Reactions  . Atorvastatin Other (See Comments)    Leg cramps    Social History:  Social History   Socioeconomic History  . Marital status: Married    Spouse name: Not on file  . Number of children: Not on file  . Years of education: Not on file  . Highest education level: Not on file  Social Needs  . Financial resource strain: Not on file  . Food insecurity - worry: Not on file  . Food insecurity - inability: Not on file  . Transportation needs - medical: Not on file  . Transportation needs - non-medical: Not on file  Occupational History  . Not on file  Tobacco Use  . Smoking status: Former Smoker    Packs/day: 0.50    Years: 17.00    Pack years: 8.50    Types: Cigarettes    Last attempt to quit: 04/27/2008    Years since  quitting: 9.3  . Smokeless tobacco: Never Used  . Tobacco comment: quit 7 years   Substance and Sexual Activity  . Alcohol use: No    Alcohol/week: 0.0 oz  . Drug use: No  . Sexual activity: Yes  Other Topics Concern  . Not on file  Social History Narrative   Lives in [place] with son 15YO and fiance      Work - [employer]   Social History   Tobacco Use  Smoking Status Former Smoker  . Packs/day:  0.50  . Years: 17.00  . Pack years: 8.50  . Types: Cigarettes  . Last attempt to quit: 04/27/2008  . Years since quitting: 9.3  Smokeless Tobacco Never Used  Tobacco Comment   quit 7 years    Social History   Substance and Sexual Activity  Alcohol Use No  . Alcohol/week: 0.0 oz    Family History:  Family History  Problem Relation Age of Onset  . Heart disease Mother        Pacemaker and defib  . Hypertension Mother   . Hyperlipidemia Mother   . Cancer Father   . COPD Father        Lung and brain cancer  . Cancer Maternal Grandmother        Colon  . Diabetes Paternal Grandmother   . Cirrhosis Paternal Grandmother   . Cancer Paternal Grandmother   . Kidney disease Neg Hx   . Bladder Cancer Neg Hx   . Breast cancer Neg Hx     Past medical history, surgical history, medications, allergies, family history and social history reviewed with patient today and changes made to appropriate areas of the chart.   Review of Systems - General ROS: negative Psychological ROS: negative Ophthalmic ROS: negative ENT ROS: negative Allergy and Immunology ROS: negative Breast ROS: negative for breast lumps Respiratory ROS: no cough, shortness of breath, or wheezing Cardiovascular ROS: no chest pain or dyspnea on exertion Gastrointestinal ROS: no abdominal pain, change in bowel habits, or black or bloody stools Genito-Urinary ROS: no dysuria, trouble voiding, or hematuria Musculoskeletal ROS: positive for - joint pain and joint swelling Neurological ROS: no TIA or stroke  symptoms Dermatological ROS: negative All other ROS negative except what is listed above and in the HPI.      Objective:    BP 117/74 (BP Location: Left Arm, Patient Position: Sitting, Cuff Size: Large)   Pulse 62   Temp 97.8 F (36.6 C) (Oral)   Ht 5\' 4"  (1.626 m)   Wt 288 lb 4.8 oz (130.8 kg)   SpO2 96%   BMI 49.49 kg/m   Wt Readings from Last 3 Encounters:  08/12/17 288 lb 4.8 oz (130.8 kg)  08/05/17 287 lb (130.2 kg)  05/27/17 289 lb 12.8 oz (131.5 kg)    Physical Exam  Constitutional: She is oriented to person, place, and time. She appears well-developed and well-nourished. No distress.  HENT:  Head: Atraumatic.  Right Ear: External ear normal.  Left Ear: External ear normal.  Nose: Nose normal.  Mouth/Throat: Oropharynx is clear and moist. No oropharyngeal exudate.  Eyes: Conjunctivae are normal. Pupils are equal, round, and reactive to light. No scleral icterus.  Neck: Normal range of motion. Neck supple. No thyromegaly present.  Cardiovascular: Normal rate, regular rhythm, normal heart sounds and intact distal pulses.  Pulmonary/Chest: Effort normal and breath sounds normal. No respiratory distress.  Breast exam declined with shared decision making  Abdominal: Soft. Bowel sounds are normal. She exhibits no mass. There is no tenderness.  Musculoskeletal: Normal range of motion. She exhibits no edema or tenderness.  Lymphadenopathy:    She has no cervical adenopathy.  Neurological: She is alert and oriented to person, place, and time. No cranial nerve deficit.  Skin: Skin is warm and dry. No rash noted.  Psychiatric: She has a normal mood and affect. Her behavior is normal.  Nursing note and vitals reviewed.   Results for orders placed or performed in visit on 08/12/17  HIV antibody  Result Value Ref Range   HIV 1&2 Ab, 4th Generation Negative   CBC with Differential/Platelet  Result Value Ref Range   WBC 5.7 3.4 - 10.8 x10E3/uL   RBC 4.46 3.77 - 5.28 x10E6/uL    Hemoglobin 12.1 11.1 - 15.9 g/dL   Hematocrit 37.4 34.0 - 46.6 %   MCV 84 79 - 97 fL   MCH 27.1 26.6 - 33.0 pg   MCHC 32.4 31.5 - 35.7 g/dL   RDW 14.1 12.3 - 15.4 %   Platelets 228 150 - 379 x10E3/uL   Neutrophils 46 Not Estab. %   Lymphs 46 Not Estab. %   Monocytes 6 Not Estab. %   Eos 2 Not Estab. %   Basos 0 Not Estab. %   Neutrophils Absolute 2.6 1.4 - 7.0 x10E3/uL   Lymphocytes Absolute 2.6 0.7 - 3.1 x10E3/uL   Monocytes Absolute 0.3 0.1 - 0.9 x10E3/uL   EOS (ABSOLUTE) 0.1 0.0 - 0.4 x10E3/uL   Basophils Absolute 0.0 0.0 - 0.2 x10E3/uL   Immature Granulocytes 0 Not Estab. %   Immature Grans (Abs) 0.0 0.0 - 0.1 x10E3/uL  Comprehensive metabolic panel  Result Value Ref Range   Glucose 93 65 - 99 mg/dL   BUN 12 6 - 24 mg/dL   Creatinine, Ser 0.79 0.57 - 1.00 mg/dL   GFR calc non Af Amer 89 >59 mL/min/1.73   GFR calc Af Amer 103 >59 mL/min/1.73   BUN/Creatinine Ratio 15 9 - 23   Sodium 142 134 - 144 mmol/L   Potassium 4.1 3.5 - 5.2 mmol/L   Chloride 105 96 - 106 mmol/L   CO2 23 20 - 29 mmol/L   Calcium 9.0 8.7 - 10.2 mg/dL   Total Protein 6.3 6.0 - 8.5 g/dL   Albumin 3.8 3.5 - 5.5 g/dL   Globulin, Total 2.5 1.5 - 4.5 g/dL   Albumin/Globulin Ratio 1.5 1.2 - 2.2   Bilirubin Total 0.3 0.0 - 1.2 mg/dL   Alkaline Phosphatase 98 39 - 117 IU/L   AST 11 0 - 40 IU/L   ALT 8 0 - 32 IU/L  Lipid Panel w/o Chol/HDL Ratio  Result Value Ref Range   Cholesterol, Total 167 100 - 199 mg/dL   Triglycerides 128 0 - 149 mg/dL   HDL 45 >39 mg/dL   VLDL Cholesterol Cal 26 5 - 40 mg/dL   LDL Calculated 96 0 - 99 mg/dL  TSH  Result Value Ref Range   TSH 2.170 0.450 - 4.500 uIU/mL  UA/M w/rflx Culture, Routine  Result Value Ref Range   Specific Gravity, UA 1.025 1.005 - 1.030   pH, UA 5.5 5.0 - 7.5   Color, UA Yellow Yellow   Appearance Ur Cloudy (A) Clear   Leukocytes, UA Negative Negative   Protein, UA Negative Negative/Trace   Glucose, UA Negative Negative   Ketones, UA  Negative Negative   RBC, UA Negative Negative   Bilirubin, UA Negative Negative   Urobilinogen, Ur 0.2 0.2 - 1.0 mg/dL   Nitrite, UA Negative Negative      Assessment & Plan:   Problem List Items Addressed This Visit      Digestive   Esophageal reflux    Stable and under good control with protonix. Continue current regimen      Relevant Medications   pantoprazole (PROTONIX) 40 MG tablet     Other   Acute anxiety    Stable on prn buspar for car trips. Continue current regimen  Hyperlipidemia - Primary    Await fasting lipids, continue pravastatin and work on lifestyle modifications      Relevant Medications   pravastatin (PRAVACHOL) 20 MG tablet   Other Relevant Orders   Comprehensive metabolic panel (Completed)   Lipid Panel w/o Chol/HDL Ratio (Completed)    Other Visit Diagnoses    Annual physical exam       Relevant Orders   CBC with Differential/Platelet (Completed)   TSH (Completed)   UA/M w/rflx Culture, Routine (Completed)   Chronic pain of left ankle       Intermittent flares with increased physical activity. Well controlled with meloxicam and compression brace. Pt will continue conservative management for now       Follow up plan: Return in about 6 months (around 02/12/2018) for Cholesterol f/u.   LABORATORY TESTING:  - Pap smear: up to date  IMMUNIZATIONS:   - Tdap: Tetanus vaccination status reviewed: last tetanus booster within 10 years. - Influenza: Up to date  SCREENING: -Mammogram: Up to date   PATIENT COUNSELING:   Advised to take 1 mg of folate supplement per day if capable of pregnancy.   Sexuality: Discussed sexually transmitted diseases, partner selection, use of condoms, avoidance of unintended pregnancy  and contraceptive alternatives.   Advised to avoid cigarette smoking.  I discussed with the patient that most people either abstain from alcohol or drink within safe limits (<=14/week and <=4 drinks/occasion for males, <=7/weeks  and <= 3 drinks/occasion for females) and that the risk for alcohol disorders and other health effects rises proportionally with the number of drinks per week and how often a drinker exceeds daily limits.  Discussed cessation/primary prevention of drug use and availability of treatment for abuse.   Diet: Encouraged to adjust caloric intake to maintain  or achieve ideal body weight, to reduce intake of dietary saturated fat and total fat, to limit sodium intake by avoiding high sodium foods and not adding table salt, and to maintain adequate dietary potassium and calcium preferably from fresh fruits, vegetables, and low-fat dairy products.    stressed the importance of regular exercise  Injury prevention: Discussed safety belts, safety helmets, smoke detector, smoking near bedding or upholstery.   Dental health: Discussed importance of regular tooth brushing, flossing, and dental visits.    NEXT PREVENTATIVE PHYSICAL DUE IN 1 YEAR. Return in about 6 months (around 02/12/2018) for Cholesterol f/u.

## 2017-08-13 LAB — COMPREHENSIVE METABOLIC PANEL
A/G RATIO: 1.5 (ref 1.2–2.2)
ALT: 8 IU/L (ref 0–32)
AST: 11 IU/L (ref 0–40)
Albumin: 3.8 g/dL (ref 3.5–5.5)
Alkaline Phosphatase: 98 IU/L (ref 39–117)
BILIRUBIN TOTAL: 0.3 mg/dL (ref 0.0–1.2)
BUN/Creatinine Ratio: 15 (ref 9–23)
BUN: 12 mg/dL (ref 6–24)
CHLORIDE: 105 mmol/L (ref 96–106)
CO2: 23 mmol/L (ref 20–29)
Calcium: 9 mg/dL (ref 8.7–10.2)
Creatinine, Ser: 0.79 mg/dL (ref 0.57–1.00)
GFR calc non Af Amer: 89 mL/min/{1.73_m2} (ref >59)
GFR, EST AFRICAN AMERICAN: 103 mL/min/{1.73_m2} (ref >59)
GLOBULIN, TOTAL: 2.5 g/dL (ref 1.5–4.5)
Glucose: 93 mg/dL (ref 65–99)
POTASSIUM: 4.1 mmol/L (ref 3.5–5.2)
SODIUM: 142 mmol/L (ref 134–144)
Total Protein: 6.3 g/dL (ref 6.0–8.5)

## 2017-08-13 LAB — CBC WITH DIFFERENTIAL/PLATELET
BASOS: 0 %
Basophils Absolute: 0 10*3/uL (ref 0.0–0.2)
EOS (ABSOLUTE): 0.1 10*3/uL (ref 0.0–0.4)
Eos: 2 %
Hematocrit: 37.4 % (ref 34.0–46.6)
Hemoglobin: 12.1 g/dL (ref 11.1–15.9)
Immature Grans (Abs): 0 10*3/uL (ref 0.0–0.1)
Immature Granulocytes: 0 %
Lymphocytes Absolute: 2.6 10*3/uL (ref 0.7–3.1)
Lymphs: 46 %
MCH: 27.1 pg (ref 26.6–33.0)
MCHC: 32.4 g/dL (ref 31.5–35.7)
MCV: 84 fL (ref 79–97)
MONOS ABS: 0.3 10*3/uL (ref 0.1–0.9)
Monocytes: 6 %
NEUTROS ABS: 2.6 10*3/uL (ref 1.4–7.0)
NEUTROS PCT: 46 %
PLATELETS: 228 10*3/uL (ref 150–379)
RBC: 4.46 x10E6/uL (ref 3.77–5.28)
RDW: 14.1 % (ref 12.3–15.4)
WBC: 5.7 10*3/uL (ref 3.4–10.8)

## 2017-08-13 LAB — LIPID PANEL W/O CHOL/HDL RATIO
Cholesterol, Total: 167 mg/dL (ref 100–199)
HDL: 45 mg/dL (ref >39)
LDL Calculated: 96 mg/dL (ref 0–99)
TRIGLYCERIDES: 128 mg/dL (ref 0–149)
VLDL Cholesterol Cal: 26 mg/dL (ref 5–40)

## 2017-08-13 LAB — TSH: TSH: 2.17 u[IU]/mL (ref 0.450–4.500)

## 2017-08-13 NOTE — Assessment & Plan Note (Signed)
Stable and under good control with protonix. Continue current regimen

## 2017-08-13 NOTE — Assessment & Plan Note (Signed)
Stable on prn buspar for car trips. Continue current regimen

## 2017-08-13 NOTE — Patient Instructions (Signed)
Follow up in 6 months 

## 2017-08-13 NOTE — Assessment & Plan Note (Signed)
Await fasting lipids, continue pravastatin and work on lifestyle modifications

## 2017-10-03 ENCOUNTER — Telehealth: Payer: Self-pay | Admitting: Family Medicine

## 2017-10-03 NOTE — Telephone Encounter (Signed)
Copied from [place] [phone]. Topic: General - Other >> Sep 24, 2017  9:41 AM Carolyn Stare wrote:  Pt would like a call back about a bill she received . She rec a bill for 260.00 because of a refill doing her appt for her physical. She would like a call back to discuss how this bill can be corrected  she saw Merrie Roof in March for her CPE   [phone]   >> Sep 27, 2017  6:13 PM Barth Kirks B wrote: Spoke with patient and advised that I would send a message to the provider to review charges.  Will follow up with patient accordingly.

## 2017-12-03 NOTE — Progress Notes (Signed)
9:16 AM   Latoya Lutz Latoya Lutz 1971/01/18 [id number]  Referring provider: Valerie Roys, DO [place], [place] [address]  Chief Complaint  Patient presents with  . uretheral stricture    HPI: Patient  is a 47 y/o Caucasian female with a h/o urethral stricture and nephrolithiasis who presents today for a urethral dilation.   Background history She was referred to [place] in 2013 for increased frequency, increased urgency and feelings of not emptying her bladder by Dr. Ronette Deter, M.D.  Per patient, she was diagnosed by Dr. Madelin Headings with urethral dilation and had been receiving serial dilations every 4 to 6 months since 2013.    She also has a history of right ureteral stones treated with ESWL and right URS/LL/right ureteral stent placement and stent removal in winter 2016.  A renal ultrasound completed on 07/13/2015 did not note any hydronephrosis or nephrolithiasis. KUB On 08/22/2015 noted probable interval passage of distal left ureteral calculi noted on prior KUB and a question of a small left renal calculus.  KUB on 11/09/2016 noted a 5 mm calcification projects over the lower pole of the left renal shadow consistent with nephrolithiasis.  CT Renal stone study performed on 04/05/2017 noted no nephroureterolithiasis.  No hydronephrosis.  Hepatic steatosis.    Today, she is complaining of frequency and nocturia which is baseline.  She states she passed a stone after her last urethral dilation.  Patient denies any gross hematuria, dysuria or suprapubic/flank pain.  Patient denies any fevers, chills, nausea or vomiting.  Her UA is positive for moderate bacteria which is baseline for her.    PMH: Past Medical History:  Diagnosis Date  . Acid reflux   . Arthritis   . Candidal dermatitis 05/25/2014  . Cervical spine disease   . Chicken pox   . Chronic urethral narrowing    undergoing stretching  . Complication of anesthesia   . Endometriosis    Dr. Rushie Chestnut at [facility], removed   .  Esophageal reflux 05/25/2014  . Gross hematuria 04/25/2015  . H/O cystoscopy    normal  . Heart murmur   . Infection of urinary tract 09/11/2014  . Kidney stone 09/11/2014  . Kidney stones   . Microscopic hematuria 09/11/2014  . Obesity, morbid, BMI 40.0-49.9 ([place]) 04/10/2012  . PONV (postoperative nausea and vomiting)   . Right ureteral stone 04/28/2015  . Skin cancer   . Urinary retention     Surgical History: Past Surgical History:  Procedure Laterality Date  . ABDOMINAL HYSTERECTOMY     [facility] complete  . BLADDER SURGERY  07/2003  . CARPAL TUNNEL RELEASE Right   . CESAREAN SECTION  1998  . CHOLECYSTECTOMY    . CYSTOSCOPY W/ RETROGRADES Right 05/16/2015   Procedure: CYSTOSCOPY WITH RETROGRADE PYELOGRAM;  Surgeon: Nickie Retort, MD;  Location: [facility];  Service: Urology;  Laterality: Right;  . CYSTOSCOPY W/ URETERAL STENT PLACEMENT Right 05/16/2015   Procedure: CYSTOSCOPY WITH STENT REPLACEMENT;  Surgeon: Nickie Retort, MD;  Location: [facility];  Service: Urology;  Laterality: Right;  . CYSTOSCOPY WITH STENT PLACEMENT Right 04/29/2015   Procedure: CYSTOSCOPY WITH STENT PLACEMENT;  Surgeon: Nickie Retort, MD;  Location: [facility];  Service: Urology;  Laterality: Right;  . EXTRACORPOREAL SHOCK WAVE LITHOTRIPSY Left 06/02/2015   Procedure: EXTRACORPOREAL SHOCK WAVE LITHOTRIPSY (ESWL);  Surgeon: Hollice Espy, MD;  Location: [facility];  Service: Urology;  Laterality: Left;  . FOOT SURGERY  2003  . KNEE SURGERY  2005  . TONSILLECTOMY    . TONSILLECTOMY     lingual growth on vocal cord removal  . URETEROSCOPY WITH HOLMIUM LASER LITHOTRIPSY Right 05/16/2015   Procedure: URETEROSCOPY WITH HOLMIUM LASER LITHOTRIPSY;  Surgeon: Nickie Retort, MD;  Location: [facility];  Service: Urology;  Laterality: Right;    Home Medications:  Allergies as of 12/04/2017      Reactions   Atorvastatin Other (See Comments)   Leg cramps      Medication List        Accurate as of 12/04/17   9:16 AM. Always use your most recent med list.          acyclovir 400 MG tablet Commonly known as:  ZOVIRAX Take 1 tablet (400 mg total) by mouth 3 (three) times daily.   azelastine 0.1 % nasal spray Commonly known as:  ASTELIN Place 1 spray into both nostrils 2 (two) times daily.   busPIRone 10 MG tablet Commonly known as:  BUSPAR TAKE 1 TABLET BY MOUTH 3 TIMES DAILY   CALCIUM 600 PO Take 1 tablet by mouth daily.   cyclobenzaprine 10 MG tablet Commonly known as:  FLEXERIL Take 1 tablet (10 mg total) 3 (three) times daily as needed by mouth for muscle spasms.   fluticasone 50 MCG/ACT nasal spray Commonly known as:  FLONASE Place 2 sprays into both nostrils daily.   meloxicam 15 MG tablet Commonly known as:  MOBIC Take 1 tablet (15 mg total) by mouth daily.   MENOPAUSE FORMULA Tabs Take by mouth. Reported on 11/04/2015   pantoprazole 40 MG tablet Commonly known as:  PROTONIX Take 1 tablet (40 mg total) by mouth daily.   pravastatin 20 MG tablet Commonly known as:  PRAVACHOL Take 1 tablet (20 mg total) by mouth daily.   pseudoephedrine 30 MG tablet Commonly known as:  SUDAFED Take 30 mg by mouth 3 (three) times daily.   Simethicone 180 MG Caps Take 1 capsule (180 mg total) by mouth 3 (three) times daily with meals.   triamcinolone cream 0.1 % Commonly known as:  KENALOG Apply 1 application topically 2 (two) times daily as needed.   Vitamin D (Cholecalciferol) 1000 units Tabs Take by mouth.   vitamin E 400 UNIT capsule Take 400 Units by mouth daily.       Allergies:  Allergies  Allergen Reactions  . Atorvastatin Other (See Comments)    Leg cramps    Family History: Family History  Problem Relation Age of Onset  . Heart disease Mother        Pacemaker and defib  . Hypertension Mother   . Hyperlipidemia Mother   . Cancer Father   . COPD Father        Lung and brain cancer  . Cancer Maternal Grandmother        Colon  . Diabetes Paternal  Grandmother   . Cirrhosis Paternal Grandmother   . Cancer Paternal Grandmother   . Kidney disease Neg Hx   . Bladder Cancer Neg Hx   . Breast cancer Neg Hx     Social History:  reports that she quit smoking about 9 years ago. Her smoking use included cigarettes. She has a 8.50 pack-year smoking history. She has never used smokeless tobacco. She reports that she does not drink alcohol or use drugs.  ROS: Urological Symptom Review UROLOGY Frequent Urination?: Yes Hard to postpone urination?: No Burning/pain with urination?: No Get up at night to urinate?: Yes Leakage of urine?: No Urine  stream starts and stops?: No Trouble starting stream?: No Do you have to strain to urinate?: No Blood in urine?: No Urinary tract infection?: No Sexually transmitted disease?: No Injury to kidneys or bladder?: No Painful intercourse?: No Weak stream?: No Currently pregnant?: No Vaginal bleeding?: No Last menstrual period?: n Gastrointestinal Nausea?: No Vomiting?: No Indigestion/heartburn?: No Diarrhea?: No Constipation?: No Constitutional Fever: No Night sweats?: No Weight loss?: No Fatigue?: No Skin Skin rash/lesions?: No Itching?: No Eyes Blurred vision?: No Double vision?: No Ears/Nose/Throat Sore throat?: No Sinus problems?: No Hematologic/Lymphatic Swollen glands?: No Easy bruising?: No Cardiovascular Leg swelling?: No Chest pain?: No Respiratory Cough?: No Shortness of breath?: No Endocrine Excessive thirst?: No Musculoskeletal Back pain?: No Joint pain?: No Neurological Headaches?: No Dizziness?: No Psychologic Depression?: No Anxiety?: No UROLOGY Frequent Urination?: Yes Hard to postpone urination?: No Burning/pain with urination?: No Get up at night to urinate?: Yes Leakage of urine?: No Urine stream starts and stops?: No Trouble starting stream?: No Do you have to strain to urinate?: No Blood in urine?: No Urinary tract infection?: No Sexually  transmitted disease?: No Injury to kidneys or bladder?: No Painful intercourse?: No Weak stream?: No Currently pregnant?: No Vaginal bleeding?: No Last menstrual period?: n Gastrointestinal Nausea?: No Vomiting?: No Indigestion/heartburn?: No Diarrhea?: No Constipation?: No Constitutional Fever: No Night sweats?: No Weight loss?: No Fatigue?: No Skin Skin rash/lesions?: No Itching?: No Eyes Blurred vision?: No Double vision?: No Ears/Nose/Throat Sore throat?: No Sinus problems?: No Hematologic/Lymphatic Swollen glands?: No Easy bruising?: No Cardiovascular Leg swelling?: No Chest pain?: No Respiratory Cough?: No Shortness of breath?: No Endocrine Excessive thirst?: No Musculoskeletal Back pain?: No Joint pain?: No Neurological Headaches?: No Dizziness?: No Psychologic Depression?: No Anxiety?: No   Physical Exam: There were no vitals taken for this visit. Constitutional: Well nourished. Alert and oriented, No acute distress. HEENT: [place] AT, moist mucus membranes. Trachea midline, no masses. Cardiovascular: No clubbing, cyanosis, or edema. Respiratory: Normal respiratory effort, no increased work of breathing. GI: Abdomen is soft, non tender, non distended, no abdominal masses. Liver and spleen not palpable.  No hernias appreciated.  Stool sample for occult testing is not indicated.   GU: No CVA tenderness.  No bladder fullness or masses.  Normal external genitalia, normal pubic hair distribution, no lesions.  Normal urethral meatus, no lesions, no prolapse, no discharge.   No urethral masses, tenderness and/or tenderness. No bladder fullness, tenderness or masses. Normal vagina mucosa, good estrogen effect, no discharge, no lesions, good pelvic support, no cystocele or rectocele noted.  Cervix, uterus and adnexa are surgically absent.  Anus and perineum are without rashes or lesions.    Skin: No rashes, bruises or suspicious lesions. Lymph: No cervical or  inguinal adenopathy. Neurologic: Grossly intact, no focal deficits, moving all 4 extremities. Psychiatric: Normal mood and affect.   Laboratory Data: Urinalysis See HPI and EPIC.   I have reviewed the labs.    Procedure:   Patient is placed in stirrups and her urethral meatus and vulva are cleansed with Betadine.  2% Lidocaine jelly was inserted into her urethra.  I then dilated her with Elby Showers sounds to a 33f without difficultly.  She tolerated the procedure well.  She will return in 5 months.  Assessment & Plan:    1. 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  Her UA was negative for microscopic hematuria.  She will follow up in five months time for next dilation.    - Urinalysis, Complete  2. History of nephrolithiasis UA is negative for hematuria No reports of flank pain or gross hematuria     Return in about 5 months (around 05/06/2018) for dilation .  Zara Council, PA-C  [facility] [address] [place], [place] [address] [phone]

## 2017-12-04 ENCOUNTER — Encounter: Payer: Self-pay | Admitting: Urology

## 2017-12-04 ENCOUNTER — Ambulatory Visit (INDEPENDENT_AMBULATORY_CARE_PROVIDER_SITE_OTHER): Payer: 59 | Admitting: Urology

## 2017-12-04 ENCOUNTER — Ambulatory Visit: Payer: 59 | Admitting: Urology

## 2017-12-04 VITALS — BP 131/80 | HR 80 | Ht 64.0 in | Wt 287.7 lb

## 2017-12-04 DIAGNOSIS — N3592 Unspecified urethral stricture, female: Secondary | ICD-10-CM

## 2017-12-04 DIAGNOSIS — Z87442 Personal history of urinary calculi: Secondary | ICD-10-CM | POA: Diagnosis not present

## 2017-12-05 LAB — URINALYSIS, COMPLETE
Bilirubin, UA: NEGATIVE
GLUCOSE, UA: NEGATIVE
Ketones, UA: NEGATIVE
Leukocytes, UA: NEGATIVE
Nitrite, UA: NEGATIVE
PH UA: 5 (ref 5.0–7.5)
PROTEIN UA: NEGATIVE
RBC, UA: NEGATIVE
Specific Gravity, UA: 1.025 (ref 1.005–1.030)
Urobilinogen, Ur: 0.2 mg/dL (ref 0.2–1.0)

## 2017-12-05 LAB — MICROSCOPIC EXAMINATION: RBC MICROSCOPIC, UA: NONE SEEN /HPF (ref 0–2)

## 2017-12-11 ENCOUNTER — Ambulatory Visit: Payer: 59 | Admitting: Urology

## 2017-12-24 NOTE — Telephone Encounter (Signed)
LMOM to advise patient that the $61.21 balance is due.  Providers documentation does warrant CPE and OV.

## 2018-04-30 ENCOUNTER — Ambulatory Visit (INDEPENDENT_AMBULATORY_CARE_PROVIDER_SITE_OTHER): Payer: 59 | Admitting: Urology

## 2018-04-30 ENCOUNTER — Encounter: Payer: Self-pay | Admitting: Urology

## 2018-04-30 VITALS — BP 135/84 | HR 72 | Ht 64.0 in | Wt 281.8 lb

## 2018-04-30 DIAGNOSIS — Z87442 Personal history of urinary calculi: Secondary | ICD-10-CM

## 2018-04-30 DIAGNOSIS — N3592 Unspecified urethral stricture, female: Secondary | ICD-10-CM

## 2018-04-30 LAB — MICROSCOPIC EXAMINATION
RBC, UA: NONE SEEN /hpf (ref 0–2)
WBC, UA: NONE SEEN /hpf (ref 0–5)

## 2018-04-30 LAB — URINALYSIS, COMPLETE
BILIRUBIN UA: NEGATIVE
GLUCOSE, UA: NEGATIVE
KETONES UA: NEGATIVE
LEUKOCYTES UA: NEGATIVE
Nitrite, UA: NEGATIVE
Protein, UA: NEGATIVE
RBC UA: NEGATIVE
SPEC GRAV UA: 1.025 (ref 1.005–1.030)
Urobilinogen, Ur: 0.2 mg/dL (ref 0.2–1.0)
pH, UA: 6.5 (ref 5.0–7.5)

## 2018-04-30 NOTE — Progress Notes (Signed)
9:03 AM   [place] 1971/04/30 [id number]  Referring provider: Valerie Roys, DO [place], [place] [address]  No chief complaint on file.   HPI: Patient  is a 47 y/o Caucasian female with a h/o urethral stricture and nephrolithiasis who presents today for a urethral dilation.   History of urethral stricture She was referred to [place] in 2013 for increased frequency, increased urgency and feelings of not emptying her bladder by Dr. Ronette Deter, M.D.  Per patient, she was diagnosed by Dr. Madelin Headings with urethral dilation and had been receiving serial dilations every 4 to 6 months since 2013.   Today, she is complaining of frequency, nocturia and a weak urinary stream.  Patient denies any gross hematuria, dysuria or suprapubic/flank pain.  Patient denies any fevers, chills, nausea or vomiting.  UA today with moderate bacteria.   History of nephrolithiasis She also has a history of right ureteral stones treated with ESWL and right URS/LL/right ureteral stent placement and stent removal in winter 2016.  A renal ultrasound completed on 07/13/2015 did not note any hydronephrosis or nephrolithiasis. KUB On 08/22/2015 noted probable interval passage of distal left ureteral calculi noted on prior KUB and a question of a small left renal calculus.  KUB on 11/09/2016 noted a 5 mm calcification projects over the lower pole of the left renal shadow consistent with nephrolithiasis.  CT Renal stone study performed on 04/05/2017 noted no nephroureterolithiasis.  No hydronephrosis.  Hepatic steatosis.     PMH: Past Medical History:  Diagnosis Date  . Acid reflux   . Arthritis   . Candidal dermatitis 05/25/2014  . Cervical spine disease   . Chicken pox   . Chronic urethral narrowing    undergoing stretching  . Complication of anesthesia   . Endometriosis    Dr. Rushie Chestnut at [facility], removed   . Esophageal reflux 05/25/2014  . Gross hematuria 04/25/2015  . H/O cystoscopy    normal  .  Heart murmur   . Infection of urinary tract 09/11/2014  . Kidney stone 09/11/2014  . Kidney stones   . Microscopic hematuria 09/11/2014  . Obesity, morbid, BMI 40.0-49.9 ([place]) 04/10/2012  . PONV (postoperative nausea and vomiting)   . Right ureteral stone 04/28/2015  . Skin cancer   . Urinary retention     Surgical History: Past Surgical History:  Procedure Laterality Date  . ABDOMINAL HYSTERECTOMY     [facility] complete  . BLADDER SURGERY  07/2003  . CARPAL TUNNEL RELEASE Right   . CESAREAN SECTION  1998  . CHOLECYSTECTOMY    . CYSTOSCOPY W/ RETROGRADES Right 05/16/2015   Procedure: CYSTOSCOPY WITH RETROGRADE PYELOGRAM;  Surgeon: Nickie Retort, MD;  Location: [facility];  Service: Urology;  Laterality: Right;  . CYSTOSCOPY W/ URETERAL STENT PLACEMENT Right 05/16/2015   Procedure: CYSTOSCOPY WITH STENT REPLACEMENT;  Surgeon: Nickie Retort, MD;  Location: [facility];  Service: Urology;  Laterality: Right;  . CYSTOSCOPY WITH STENT PLACEMENT Right 04/29/2015   Procedure: CYSTOSCOPY WITH STENT PLACEMENT;  Surgeon: Nickie Retort, MD;  Location: [facility];  Service: Urology;  Laterality: Right;  . EXTRACORPOREAL SHOCK WAVE LITHOTRIPSY Left 06/02/2015   Procedure: EXTRACORPOREAL SHOCK WAVE LITHOTRIPSY (ESWL);  Surgeon: Hollice Espy, MD;  Location: [facility];  Service: Urology;  Laterality: Left;  . FOOT SURGERY  2003  . KNEE SURGERY  2005  . TONSILLECTOMY    . TONSILLECTOMY     lingual growth on vocal cord removal  .  URETEROSCOPY WITH HOLMIUM LASER LITHOTRIPSY Right 05/16/2015   Procedure: URETEROSCOPY WITH HOLMIUM LASER LITHOTRIPSY;  Surgeon: Nickie Retort, MD;  Location: [facility];  Service: Urology;  Laterality: Right;    Home Medications:  Allergies as of 04/30/2018      Reactions   Atorvastatin Other (See Comments)   Leg cramps      Medication List        Accurate as of 04/30/18  9:03 AM. Always use your most recent med list.          acyclovir 400 MG  tablet Commonly known as:  ZOVIRAX Take 1 tablet (400 mg total) by mouth 3 (three) times daily.   azelastine 0.1 % nasal spray Commonly known as:  ASTELIN Place 1 spray into both nostrils 2 (two) times daily.   busPIRone 10 MG tablet Commonly known as:  BUSPAR TAKE 1 TABLET BY MOUTH 3 TIMES DAILY   CALCIUM 600 PO Take 1 tablet by mouth daily.   cyclobenzaprine 10 MG tablet Commonly known as:  FLEXERIL Take 1 tablet (10 mg total) 3 (three) times daily as needed by mouth for muscle spasms.   fluticasone 50 MCG/ACT nasal spray Commonly known as:  FLONASE Place 2 sprays into both nostrils daily.   meloxicam 15 MG tablet Commonly known as:  MOBIC Take 1 tablet (15 mg total) by mouth daily.   MENOPAUSE FORMULA Tabs Take by mouth. Reported on 11/04/2015   pantoprazole 40 MG tablet Commonly known as:  PROTONIX Take 1 tablet (40 mg total) by mouth daily.   pravastatin 20 MG tablet Commonly known as:  PRAVACHOL Take 1 tablet (20 mg total) by mouth daily.   pseudoephedrine 30 MG tablet Commonly known as:  SUDAFED Take 30 mg by mouth 3 (three) times daily.   Simethicone 180 MG Caps Take 1 capsule (180 mg total) by mouth 3 (three) times daily with meals.   triamcinolone cream 0.1 % Commonly known as:  KENALOG Apply 1 application topically 2 (two) times daily as needed.   Vitamin D (Cholecalciferol) 25 MCG (1000 UT) Tabs Take by mouth.   vitamin E 400 UNIT capsule Take 400 Units by mouth daily.       Allergies:  Allergies  Allergen Reactions  . Atorvastatin Other (See Comments)    Leg cramps    Family History: Family History  Problem Relation Age of Onset  . Heart disease Mother        Pacemaker and defib  . Hypertension Mother   . Hyperlipidemia Mother   . Cancer Father   . COPD Father        Lung and brain cancer  . Cancer Maternal Grandmother        Colon  . Diabetes Paternal Grandmother   . Cirrhosis Paternal Grandmother   . Cancer Paternal  Grandmother   . Kidney disease Neg Hx   . Bladder Cancer Neg Hx   . Breast cancer Neg Hx     Social History:  reports that she quit smoking about 10 years ago. Her smoking use included cigarettes. She has a 8.50 pack-year smoking history. She has never used smokeless tobacco. She reports that she does not drink alcohol or use drugs.  ROS: Urological Symptom Review UROLOGY Frequent Urination?: Yes Hard to postpone urination?: No Burning/pain with urination?: No Get up at night to urinate?: Yes Leakage of urine?: No Urine stream starts and stops?: No Trouble starting stream?: No Do you have to strain to urinate?: No Blood in  urine?: No Urinary tract infection?: No Sexually transmitted disease?: No Injury to kidneys or bladder?: No Painful intercourse?: No Weak stream?: Yes Currently pregnant?: No Vaginal bleeding?: No Last menstrual period?: n Gastrointestinal Nausea?: No Vomiting?: No Indigestion/heartburn?: No Diarrhea?: No Constipation?: No Constitutional Fever: No Night sweats?: No Weight loss?: No Fatigue?: No Skin Skin rash/lesions?: No Itching?: No Eyes Blurred vision?: No Double vision?: No Ears/Nose/Throat Sore throat?: No Sinus problems?: No Hematologic/Lymphatic Swollen glands?: No Easy bruising?: No Cardiovascular Leg swelling?: No Chest pain?: No Respiratory Cough?: No Shortness of breath?: No Endocrine Excessive thirst?: No Musculoskeletal Back pain?: No Joint pain?: No Neurological Headaches?: No Dizziness?: No Psychologic Depression?: No Anxiety?: No UROLOGY Frequent Urination?: Yes Hard to postpone urination?: No Burning/pain with urination?: No Get up at night to urinate?: Yes Leakage of urine?: No Urine stream starts and stops?: No Trouble starting stream?: No Do you have to strain to urinate?: No Blood in urine?: No Urinary tract infection?: No Sexually transmitted disease?: No Injury to kidneys or bladder?: No Painful  intercourse?: No Weak stream?: Yes Currently pregnant?: No Vaginal bleeding?: No Last menstrual period?: n Gastrointestinal Nausea?: No Vomiting?: No Indigestion/heartburn?: No Diarrhea?: No Constipation?: No Constitutional Fever: No Night sweats?: No Weight loss?: No Fatigue?: No Skin Skin rash/lesions?: No Itching?: No Eyes Blurred vision?: No Double vision?: No Ears/Nose/Throat Sore throat?: No Sinus problems?: No Hematologic/Lymphatic Swollen glands?: No Easy bruising?: No Cardiovascular Leg swelling?: No Chest pain?: No Respiratory Cough?: No Shortness of breath?: No Endocrine Excessive thirst?: No Musculoskeletal Back pain?: No Joint pain?: No Neurological Headaches?: No Dizziness?: No Psychologic Depression?: No Anxiety?: No   Physical Exam: Blood pressure 135/84, pulse 72, height 5\' 4"  (1.626 m), weight 281 lb 12.8 oz (127.8 kg). Constitutional:  Well nourished. Alert and oriented, No acute distress. HEENT: [place] AT, moist mucus membranes.  Trachea midline, no masses. Cardiovascular: No clubbing, cyanosis, or edema. Respiratory: Normal respiratory effort, no increased work of breathing. GI: Abdomen is soft, non tender, non distended, no abdominal masses. Liver and spleen not palpable.  No hernias appreciated.  Stool sample for occult testing is not indicated.   GU: No CVA tenderness.  No bladder fullness or masses.  Normal external genitalia, normal pubic hair distribution, no lesions.  Normal urethral meatus, no lesions, no prolapse, no discharge.   No urethral masses, tenderness and/or tenderness. No bladder fullness, tenderness or masses. Normal vagina mucosa, good estrogen effect, no discharge, no lesions and fair pelvic support.    Skin: No rashes, bruises or suspicious lesions. Neurologic: Grossly intact, no focal deficits, moving all 4 extremities. Psychiatric: Normal mood and affect.   Laboratory Data: Urinalysis See HPI and EPIC.  I have  reviewed the labs.    Procedure:   Patient is placed in stirrups and her urethral meatus and vulva are cleansed with Betadine.  2% Lidocaine jelly was inserted into her urethra.  I then dilated her with Elby Showers sounds to a 68f without difficultly.  She tolerated the procedure well.  She will return in 5 months.  Assessment & Plan:    1. 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  Her UA was negative for microscopic hematuria.  She will follow up in five months time for next dilation.    - Urinalysis, Complete  2. History of nephrolithiasis UA is negative for hematuria No reports of flank pain or gross hematuria   Return in about 5 months (around 09/29/2018) for dilation .  Zara Council, PA-C  [facility] [place], [place] [address] [phone]

## 2018-05-06 ENCOUNTER — Ambulatory Visit: Payer: 59 | Admitting: Urology

## 2018-06-30 ENCOUNTER — Other Ambulatory Visit: Payer: Self-pay | Admitting: Family Medicine

## 2018-06-30 DIAGNOSIS — K219 Gastro-esophageal reflux disease without esophagitis: Secondary | ICD-10-CM

## 2018-07-10 ENCOUNTER — Other Ambulatory Visit: Payer: Self-pay

## 2018-07-10 ENCOUNTER — Ambulatory Visit (INDEPENDENT_AMBULATORY_CARE_PROVIDER_SITE_OTHER): Payer: 59 | Admitting: Nurse Practitioner

## 2018-07-10 ENCOUNTER — Encounter: Payer: Self-pay | Admitting: Nurse Practitioner

## 2018-07-10 VITALS — BP 125/82 | HR 73 | Temp 98.2°F | Ht 66.0 in | Wt 279.0 lb

## 2018-07-10 DIAGNOSIS — M25562 Pain in left knee: Secondary | ICD-10-CM

## 2018-07-10 DIAGNOSIS — M25511 Pain in right shoulder: Secondary | ICD-10-CM | POA: Insufficient documentation

## 2018-07-10 NOTE — Progress Notes (Signed)
BP 125/82   Pulse 73   Temp 98.2 F (36.8 C) (Oral)   Ht 5\' 6"  (1.676 m)   Wt 279 lb (126.6 kg)   SpO2 96%   BMI 45.03 kg/m    Subjective:    Patient ID: Latoya Lutz, female    DOB: 1970-08-11, 48 y.o.   MRN: [id number]  HPI: Latoya Lutz is a 48 y.o. female  Chief Complaint  Patient presents with  . Knee Pain    left side. pt state she fell about 2 months ago. states she has been OTC tylenol and  RX mobic. no relief   . Arm Pain    right   LEFT KNEE PAIN & RIGHT ARM PAIN Ms. Burnsworth reports she fell 2 months ago.  Fall took place at home, was putting beads on Christmas tree and was on foot stool.  Her left foot got stuck on step stool, when she got down, and she fell to right side hitting right shoulder and twisting left knee.  Has been taking Mobic and Tylenol at home (takes Mobic for ankle spurs at baseline).  Has had knee surgery on left knee, in 2007 by Dr. Derrel Nip, has occasional flares of pain in knee at baseline with prolonged walking (once a month).  Reports he has told her she has "a little arthritis in that knee".  Reports this pain as different than her baseline discomfort.  Is taking Tylenol at night, not helping sleep much at night as shoulder pain is worse at night when moving in bed. Has been wearing knee brace on occasion. Reports swelling presented over weekend left knee and that pain has been worsening to knee since fall.  Is now having difficulty with walking.  States knee pain is worse than shoulder. Duration: months Involved knee: left and right arm Mechanism of injury: fall two months ago Location:diffuse Onset: gradual Severity: 9/10 left knee and 5/10 right shoulder at worst Quality:  dull, aching and throbbing knee and shoulder pain with knee > shoulder Frequency: intermittent Radiation: no Aggravating factors: weight bearing and walking  Alleviating factors: APAP and Mobic Status: fluctuating Treatments attempted: APAP and ibuprofen    Relief with NSAIDs?:  no Weakness with weight bearing or walking: yes Sensation of giving way: no Locking: no Popping: no Bruising: no Swelling: yes Redness: no Paresthesias/decreased sensation: no Fevers: no     Relevant past medical, surgical, family and social history reviewed and updated as indicated. Interim medical history since our last visit reviewed. Allergies and medications reviewed and updated.  Review of Systems  Constitutional: Negative for activity change, appetite change, diaphoresis, fatigue and fever.  Respiratory: Negative for cough, chest tightness and shortness of breath.   Cardiovascular: Negative for chest pain, palpitations and leg swelling.  Gastrointestinal: Negative for abdominal distention, abdominal pain, constipation, diarrhea, nausea and vomiting.  Endocrine: Negative for cold intolerance, heat intolerance, polydipsia, polyphagia and polyuria.  Musculoskeletal: Positive for arthralgias and joint swelling.  Neurological: Negative for dizziness, syncope, weakness, light-headedness, numbness and headaches.  Psychiatric/Behavioral: Negative.     Per HPI unless specifically indicated above     Objective:    BP 125/82   Pulse 73   Temp 98.2 F (36.8 C) (Oral)   Ht 5\' 6"  (1.676 m)   Wt 279 lb (126.6 kg)   SpO2 96%   BMI 45.03 kg/m   Wt Readings from Last 3 Encounters:  07/10/18 279 lb (126.6 kg)  04/30/18 281 lb 12.8 oz (127.8 kg)  12/04/17 287 lb 11.2 oz (130.5 kg)    Physical Exam Vitals signs and nursing note reviewed.  Constitutional:      General: She is awake.     Appearance: She is well-developed.  HENT:     Head: Normocephalic.     Right Ear: Hearing normal.     Left Ear: Hearing normal.     Nose: Nose normal.     Mouth/Throat:     Mouth: Mucous membranes are moist.  Eyes:     General: Lids are normal.        Right eye: No discharge.        Left eye: No discharge.     Conjunctiva/sclera: Conjunctivae normal.     Pupils:  Pupils are equal, round, and reactive to light.  Neck:     Musculoskeletal: Normal range of motion and neck supple.     Thyroid: No thyromegaly.     Vascular: No carotid bruit or JVD.  Cardiovascular:     Rate and Rhythm: Normal rate and regular rhythm.     Heart sounds: Normal heart sounds. No murmur. No gallop.   Pulmonary:     Effort: Pulmonary effort is normal.     Breath sounds: Normal breath sounds.  Abdominal:     General: Bowel sounds are normal.     Palpations: Abdomen is soft. There is no hepatomegaly or splenomegaly.  Musculoskeletal:     Right shoulder: She exhibits pain and decreased strength. She exhibits normal range of motion, no tenderness, no swelling and no crepitus.     Left shoulder: She exhibits normal range of motion, no tenderness, no swelling, no crepitus, no pain and normal strength.     Left knee: She exhibits swelling. She exhibits normal range of motion, no effusion, no laceration, no erythema and no bony tenderness. Tenderness found.     Right lower leg: No edema.     Left lower leg: No edema.     Comments: Right shoulder with full ROM without discomfort.  Discomfort noted with empty can testing and mild weakness present.  Left shoulder no discomfort and FROM.  Left knee positive crepitus (which pt reports as baseline) with negative Valgus & positive Varus testing (increased discomfort with Varus).  No discomfort with flexion/extension of left knee.  Right knee FROM with no pain. Swelling noted to left knee lateral and lower aspect without erythema.  Mild tenderness to palpation lateral aspect.  No bruising or abrasions.  Right knee no tenderness or swelling. Antalgic gait present.  Lymphadenopathy:     Cervical: No cervical adenopathy.  Skin:    General: Skin is warm and dry.  Neurological:     Mental Status: She is alert and oriented to person, place, and time.  Psychiatric:        Attention and Perception: Attention normal.        Mood and Affect: Mood  normal.        Behavior: Behavior normal. Behavior is cooperative.        Thought Content: Thought content normal.        Judgment: Judgment normal.     Results for orders placed or performed in visit on 04/30/18  Microscopic Examination  Result Value Ref Range   WBC, UA None seen 0 - 5 /hpf   RBC, UA None seen 0 - 2 /hpf   Epithelial Cells (non renal) 0-10 0 - 10 /hpf   Mucus, UA Present (A) Not Estab.   Bacteria, UA Moderate (  A) None seen/Few  Urinalysis, Complete  Result Value Ref Range   Specific Gravity, UA 1.025 1.005 - 1.030   pH, UA 6.5 5.0 - 7.5   Color, UA Yellow Yellow   Appearance Ur Cloudy (A) Clear   Leukocytes, UA Negative Negative   Protein, UA Negative Negative/Trace   Glucose, UA Negative Negative   Ketones, UA Negative Negative   RBC, UA Negative Negative   Bilirubin, UA Negative Negative   Urobilinogen, Ur 0.2 0.2 - 1.0 mg/dL   Nitrite, UA Negative Negative   Microscopic Examination See below:       Assessment & Plan:   Problem List Items Addressed This Visit      Other   Acute pain of left knee - Primary    Acute with worsening pain since fall two months ago.  Positive varus stress.  Concern for tear.  Order for MRI left knee and urgent consult to ortho.  Continue Tylenol and Mobic at home.  Recommend ice at home for pain and swelling.  Wear knee brace for stability when walking.  May use Lidocaine patch at home for discomfort (pt prefers no opioids).  Return for worsening or continued symptoms.      Relevant Orders   Ambulatory referral to Orthopedics   MR Knee Left  Wo Contrast   Acute pain of right shoulder    Acute, stable since fall two months ago.  Recommend continue Tylenol (max 3000 MG daily) and Mobic at home.  Use of Biofreeze or Icy/Hot + alternate heat/ice.  Physical therapy consult placed.  Return for worsening on continued symptoms.      Relevant Orders   Ambulatory referral to Physical Therapy       Follow up plan: Return if  symptoms worsen or fail to improve.

## 2018-07-10 NOTE — Assessment & Plan Note (Signed)
Acute with worsening pain since fall two months ago.  Positive varus stress.  Concern for tear.  Order for MRI left knee and urgent consult to ortho.  Continue Tylenol and Mobic at home.  Recommend ice at home for pain and swelling.  Wear knee brace for stability when walking.  May use Lidocaine patch at home for discomfort (pt prefers no opioids).  Return for worsening or continued symptoms.

## 2018-07-10 NOTE — Assessment & Plan Note (Signed)
Acute, stable since fall two months ago.  Recommend continue Tylenol (max 3000 MG daily) and Mobic at home.  Use of Biofreeze or Icy/Hot + alternate heat/ice.  Physical therapy consult placed.  Return for worsening on continued symptoms.

## 2018-07-10 NOTE — Patient Instructions (Signed)

## 2018-07-11 ENCOUNTER — Telehealth: Payer: Self-pay

## 2018-07-11 DIAGNOSIS — M1712 Unilateral primary osteoarthritis, left knee: Secondary | ICD-10-CM | POA: Diagnosis not present

## 2018-07-11 NOTE — Telephone Encounter (Signed)
Copied from [place] [id number]. Topic: Referral - Status >> Jul 11, 2018  2:12 PM Reyne Dumas L wrote: Reason for CRM:   Pt states she was referred to ortho and for a MRI.  Pt states that she did see ortho and they do not feel that she needs the MRI.  Pt would like this to be cancelled. Pt can be reached at [phone]

## 2018-07-17 DIAGNOSIS — M1712 Unilateral primary osteoarthritis, left knee: Secondary | ICD-10-CM | POA: Diagnosis not present

## 2018-07-17 DIAGNOSIS — M179 Osteoarthritis of knee, unspecified: Secondary | ICD-10-CM | POA: Diagnosis not present

## 2018-07-22 ENCOUNTER — Ambulatory Visit: Payer: 59

## 2018-07-24 DIAGNOSIS — M25562 Pain in left knee: Secondary | ICD-10-CM | POA: Diagnosis not present

## 2018-07-28 ENCOUNTER — Ambulatory Visit
Admission: RE | Admit: 2018-07-28 | Discharge: 2018-07-28 | Disposition: A | Discharge status: Home / Self Care | Payer: 59 | Source: Ambulatory Visit | Attending: Physician Assistant | Admitting: Physician Assistant

## 2018-07-28 ENCOUNTER — Other Ambulatory Visit (HOSPITAL_COMMUNITY): Payer: Self-pay | Admitting: Physician Assistant

## 2018-07-28 ENCOUNTER — Other Ambulatory Visit: Payer: Self-pay | Admitting: Physician Assistant

## 2018-07-28 DIAGNOSIS — M25562 Pain in left knee: Secondary | ICD-10-CM | POA: Insufficient documentation

## 2018-07-28 DIAGNOSIS — R600 Localized edema: Secondary | ICD-10-CM | POA: Diagnosis not present

## 2018-07-31 DIAGNOSIS — M179 Osteoarthritis of knee, unspecified: Secondary | ICD-10-CM | POA: Diagnosis not present

## 2018-08-04 ENCOUNTER — Other Ambulatory Visit: Payer: Self-pay | Admitting: Family Medicine

## 2018-08-04 MED ORDER — ACYCLOVIR 400 MG PO TABS: 400.0000 mg | ORAL_TABLET | Freq: Three times a day (TID) | ORAL | 0 refills | Status: DC

## 2018-08-04 NOTE — Telephone Encounter (Signed)
Disregard previous refill request. Please see current one.

## 2018-08-04 NOTE — Telephone Encounter (Signed)
Patient requested a Rx refill for acyclovir (Astelin) 400 mg tablet.

## 2018-08-29 ENCOUNTER — Other Ambulatory Visit: Payer: Self-pay | Admitting: Family Medicine

## 2018-08-29 MED ORDER — ACYCLOVIR 400 MG PO TABS: 400.0000 mg | ORAL_TABLET | Freq: Three times a day (TID) | ORAL | 0 refills | Status: DC

## 2018-09-22 ENCOUNTER — Encounter: Payer: Self-pay | Admitting: Urology

## 2018-09-22 ENCOUNTER — Ambulatory Visit (INDEPENDENT_AMBULATORY_CARE_PROVIDER_SITE_OTHER): Payer: 59 | Admitting: Urology

## 2018-09-22 ENCOUNTER — Other Ambulatory Visit: Payer: Self-pay

## 2018-09-22 VITALS — BP 152/85 | HR 72 | Ht 66.0 in | Wt 271.0 lb

## 2018-09-22 DIAGNOSIS — N3592 Unspecified urethral stricture, female: Secondary | ICD-10-CM | POA: Diagnosis not present

## 2018-09-22 LAB — URINALYSIS, COMPLETE
Bilirubin, UA: NEGATIVE
Glucose, UA: NEGATIVE
Ketones, UA: NEGATIVE
Leukocytes,UA: NEGATIVE
Nitrite, UA: NEGATIVE
Protein,UA: NEGATIVE
RBC, UA: NEGATIVE
Specific Gravity, UA: >1.03 — ABNORMAL HIGH (ref 1.005–1.030)
Urobilinogen, Ur: 0.2 mg/dL (ref 0.2–1.0)
pH, UA: 5 (ref 5.0–7.5)

## 2018-09-22 LAB — MICROSCOPIC EXAMINATION: RBC, Urine: NONE SEEN /hpf (ref 0–2)

## 2018-09-22 NOTE — Progress Notes (Signed)
3:52 PM   [place] [birth year] [id number]  Referring provider: Valerie Roys, DO [place], [place] [address]  Chief Complaint  Patient presents with  . 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   History of urethral stricture She was referred to [place] in 2013 for increased frequency, increased urgency and feelings of not emptying her bladder by Dr. Ronette Deter, M.D.  Per patient, she was diagnosed by Dr. Madelin Headings with urethral dilation and had been receiving serial dilations every 4 to 6 months since 2013.   Today, she is complaining of frequency, urgency and nocturia.  Patient denies any gross hematuria, dysuria or suprapubic/flank pain.  Patient denies any fevers, chills, nausea or vomiting.  UA today is negative.    History of nephrolithiasis She also has a history of right ureteral stones treated with ESWL and right URS/LL/right ureteral stent placement and stent removal in winter 2016.  A renal ultrasound completed on 07/13/2015 did not note any hydronephrosis or nephrolithiasis. KUB On 08/22/2015 noted probable interval passage of distal left ureteral calculi noted on prior KUB and a question of a small left renal calculus.  KUB on 11/09/2016 noted a 5 mm calcification projects over the lower pole of the left renal shadow consistent with nephrolithiasis.  CT Renal stone study performed on 04/05/2017 noted no nephroureterolithiasis.  No hydronephrosis.  Hepatic steatosis.     PMH: Past Medical History:  Diagnosis Date  . Acid reflux   . Arthritis   . Candidal dermatitis 05/25/2014  . Cervical spine disease   . Chicken pox   . Chronic urethral narrowing    undergoing stretching  . Complication of anesthesia   . Endometriosis    Dr. Rushie Chestnut at [facility], removed   . Esophageal reflux 05/25/2014  . Gross hematuria 04/25/2015  . H/O  cystoscopy    normal  . Heart murmur   . Infection of urinary tract 09/11/2014  . Kidney stone 09/11/2014  . Kidney stones   . Microscopic hematuria 09/11/2014  . Obesity, morbid, BMI 40.0-49.9 ([place]) 04/10/2012  . PONV (postoperative nausea and vomiting)   . Right ureteral stone 04/28/2015  . Skin cancer   . Urinary retention     Surgical History: Past Surgical History:  Procedure Laterality Date  . ABDOMINAL HYSTERECTOMY     [facility] complete  . BLADDER SURGERY  07/2003  . CARPAL TUNNEL RELEASE Right   . CESAREAN SECTION  1998  . CHOLECYSTECTOMY    . CYSTOSCOPY W/ RETROGRADES Right 05/16/2015   Procedure: CYSTOSCOPY WITH RETROGRADE PYELOGRAM;  Surgeon: Nickie Retort, MD;  Location: [facility];  Service: Urology;  Laterality: Right;  . CYSTOSCOPY W/ URETERAL STENT PLACEMENT Right 05/16/2015   Procedure: CYSTOSCOPY WITH STENT REPLACEMENT;  Surgeon: Nickie Retort, MD;  Location: [facility];  Service: Urology;  Laterality: Right;  . CYSTOSCOPY WITH STENT PLACEMENT Right 04/29/2015   Procedure: CYSTOSCOPY WITH STENT PLACEMENT;  Surgeon: Nickie Retort, MD;  Location: [facility] OR[facility];  Service: Urology;  Laterality: Right;  . EXTRACORPOREAL SHOCK WAVE LITHOTRIPSY Left 06/02/2015   Procedure: EXTRACORPOREAL SHOCK WAVE LITHOTRIPSY (ESWL);  Surgeon: Hollice Espy, MD;  Location: [facility] OR[facility];  Service: Urology;  Laterality: Left;  . FOOT SURGERY  2003  . KNEE SURGERY  2005  . TONSILLECTOMY    . TONSILLECTOMY     lingual  growth on vocal cord removal  . URETEROSCOPY WITH HOLMIUM LASER LITHOTRIPSY Right 05/16/2015   Procedure: URETEROSCOPY WITH HOLMIUM LASER LITHOTRIPSY;  Surgeon: Nickie Retort, MD;  Location: [facility];  Service: Urology;  Laterality: Right;    Home Medications:  Allergies as of 09/22/2018      Reactions   Atorvastatin Other (See Comments)   Leg cramps      Medication List       Accurate as of September 22, 2018  3:52 PM. Always use your most recent med list.         acyclovir 400 MG tablet Commonly known as:  Zovirax Take 1 tablet (400 mg total) by mouth 3 (three) times daily.   azelastine 0.1 % nasal spray Commonly known as:  ASTELIN Place 1 spray into both nostrils 2 (two) times daily.   busPIRone 10 MG tablet Commonly known as:  BUSPAR TAKE 1 TABLET BY MOUTH 3 TIMES DAILY   CALCIUM 600 PO Take 1 tablet by mouth daily.   diclofenac sodium 1 % Gel Commonly known as:  VOLTAREN   fluticasone 50 MCG/ACT nasal spray Commonly known as:  FLONASE Place 2 sprays into both nostrils daily.   meloxicam 15 MG tablet Commonly known as:  MOBIC Take 1 tablet (15 mg total) by mouth daily.   Menopause Formula Tabs Take by mouth. Reported on 11/04/2015   pantoprazole 40 MG tablet Commonly known as:  PROTONIX TAKE 1 TABLET BY MOUTH DAILY   pravastatin 20 MG tablet Commonly known as:  PRAVACHOL TAKE 1 TABLET BY MOUTH DAILY   pseudoephedrine 30 MG tablet Commonly known as:  SUDAFED Take 30 mg by mouth 3 (three) times daily.   Simethicone 180 MG Caps Take 1 capsule (180 mg total) by mouth 3 (three) times daily with meals.   traMADol 50 MG tablet Commonly known as:  ULTRAM   triamcinolone cream 0.1 % Commonly known as:  KENALOG Apply 1 application topically 2 (two) times daily as needed.   Vitamin D (Cholecalciferol) 25 MCG (1000 UT) Tabs Take by mouth.   vitamin E 400 UNIT capsule Take 400 Units by mouth daily.       Allergies:  Allergies  Allergen Reactions  . Atorvastatin Other (See Comments)    Leg cramps    Family History: Family History  Problem Relation Age of Onset  . Heart disease Mother        Pacemaker and defib  . Hypertension Mother   . Hyperlipidemia Mother   . Cancer Father   . COPD Father        Lung and brain cancer  . Cancer Maternal Grandmother        Colon  . Diabetes Paternal Grandmother   . Cirrhosis Paternal Grandmother   . Cancer Paternal Grandmother   . Kidney disease Neg Hx   . Bladder Cancer  Neg Hx   . Breast cancer Neg Hx     Social History:  reports that she quit smoking about 10 years ago. Her smoking use included cigarettes. She has a 8.50 pack-year smoking history. She has never used smokeless tobacco. She reports that she does not drink alcohol or use drugs.  ROS: Urological Symptom Review UROLOGY Frequent Urination?: Yes Hard to postpone urination?: Yes Burning/pain with urination?: No Get up at night to urinate?: Yes Leakage of urine?: No Urine stream starts and stops?: No Trouble starting stream?: No Do you have to strain to urinate?: No Blood in urine?: No Urinary tract infection?:  No Sexually transmitted disease?: No Injury to kidneys or bladder?: No Painful intercourse?: No Weak stream?: No Currently pregnant?: No Vaginal bleeding?: No Last menstrual period?: n Gastrointestinal Nausea?: No Vomiting?: No Indigestion/heartburn?: No Diarrhea?: No Constipation?: No Constitutional Fever: No Night sweats?: No Weight loss?: No Fatigue?: No Skin Skin rash/lesions?: No Itching?: No Eyes Blurred vision?: No Double vision?: No Ears/Nose/Throat Sore throat?: No Sinus problems?: No Hematologic/Lymphatic Swollen glands?: No Easy bruising?: No Cardiovascular Leg swelling?: No Chest pain?: No Respiratory Cough?: No Shortness of breath?: No Endocrine Excessive thirst?: No Musculoskeletal Back pain?: No Joint pain?: No Neurological Headaches?: No Dizziness?: No Psychologic Depression?: No Anxiety?: No UROLOGY Frequent Urination?: Yes Hard to postpone urination?: Yes Burning/pain with urination?: No Get up at night to urinate?: Yes Leakage of urine?: No Urine stream starts and stops?: No Trouble starting stream?: No Do you have to strain to urinate?: No Blood in urine?: No Urinary tract infection?: No Sexually transmitted disease?: No Injury to kidneys or bladder?: No Painful intercourse?: No Weak stream?: No Currently pregnant?:  No Vaginal bleeding?: No Last menstrual period?: n Gastrointestinal Nausea?: No Vomiting?: No Indigestion/heartburn?: No Diarrhea?: No Constipation?: No Constitutional Fever: No Night sweats?: No Weight loss?: No Fatigue?: No Skin Skin rash/lesions?: No Itching?: No Eyes Blurred vision?: No Double vision?: No Ears/Nose/Throat Sore throat?: No Sinus problems?: No Hematologic/Lymphatic Swollen glands?: No Easy bruising?: No Cardiovascular Leg swelling?: No Chest pain?: No Respiratory Cough?: No Shortness of breath?: No Endocrine Excessive thirst?: No Musculoskeletal Back pain?: No Joint pain?: No Neurological Headaches?: No Dizziness?: No Psychologic Depression?: No Anxiety?: No   Physical Exam: Blood pressure (!) 152/85, pulse 72, height 5\' 6"  (1.676 m), weight 271 lb (122.9 kg). Constitutional:  Well nourished. Alert and oriented, No acute distress. HEENT: [place] AT, moist mucus membranes.  Trachea midline, no masses. Cardiovascular: No clubbing, cyanosis, or edema. Respiratory: Normal respiratory effort, no increased work of breathing. GI: Abdomen is soft, non tender, non distended, no abdominal masses.  GU: No CVA tenderness.  No bladder fullness or masses.  Normal external genitalia, normal pubic hair distribution, no lesions.  Normal urethral meatus, no lesions, no prolapse, no discharge.   No urethral masses, tenderness and/or tenderness. No bladder fullness, tenderness or masses.   Neurologic: Grossly intact, no focal deficits, moving all 4 extremities. Psychiatric: Normal mood and affect.   Laboratory Data: Urinalysis See HPI and EPIC.  I have reviewed the labs.    Procedure:   Patient is placed in stirrups and her urethral meatus and vulva are cleansed with Betadine.  2% Lidocaine jelly was inserted into her urethra.  I then dilated her with Elby Showers sounds to a 73f without difficultly.  She tolerated the procedure well.  She will return in 5 months.   Assessment & Plan:    1. 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  Her UA was negative for microscopic hematuria.  She will follow up in five months time for next dilation.    - Urinalysis, Complete  2. History of nephrolithiasis UA is negative for hematuria No reports of flank pain or gross hematuria   Return in about 5 months (around 02/22/2019) for for dilation .  Zara Council, PA-C  [facility] [address] [place],  [address] [phone]

## 2018-09-26 ENCOUNTER — Other Ambulatory Visit
Admission: RE | Admit: 2018-09-26 | Discharge: 2018-09-26 | Disposition: A | Discharge status: Home / Self Care | Payer: 59 | Source: Ambulatory Visit | Attending: Family Medicine | Admitting: Family Medicine

## 2018-09-26 ENCOUNTER — Telehealth: Payer: Self-pay | Admitting: Family Medicine

## 2018-09-26 ENCOUNTER — Other Ambulatory Visit: Payer: Self-pay | Admitting: Family Medicine

## 2018-09-26 DIAGNOSIS — R030 Elevated blood-pressure reading, without diagnosis of hypertension: Secondary | ICD-10-CM | POA: Insufficient documentation

## 2018-09-26 DIAGNOSIS — R310 Gross hematuria: Secondary | ICD-10-CM | POA: Diagnosis not present

## 2018-09-26 DIAGNOSIS — E782 Mixed hyperlipidemia: Secondary | ICD-10-CM | POA: Insufficient documentation

## 2018-09-26 DIAGNOSIS — K219 Gastro-esophageal reflux disease without esophagitis: Secondary | ICD-10-CM | POA: Insufficient documentation

## 2018-09-26 DIAGNOSIS — F419 Anxiety disorder, unspecified: Secondary | ICD-10-CM | POA: Insufficient documentation

## 2018-09-26 LAB — CBC WITH DIFFERENTIAL/PLATELET
Abs Immature Granulocytes: 0.01 10*3/uL (ref 0.00–0.07)
Basophils Absolute: 0 10*3/uL (ref 0.0–0.1)
Basophils Relative: 0 %
Eosinophils Absolute: 0.1 10*3/uL (ref 0.0–0.5)
Eosinophils Relative: 1 %
HCT: 37.6 % (ref 36.0–46.0)
Hemoglobin: 11.9 g/dL — ABNORMAL LOW (ref 12.0–15.0)
Immature Granulocytes: 0 %
Lymphocytes Relative: 40 %
Lymphs Abs: 2.7 10*3/uL (ref 0.7–4.0)
MCH: 26.9 pg (ref 26.0–34.0)
MCHC: 31.6 g/dL (ref 30.0–36.0)
MCV: 85.1 fL (ref 80.0–100.0)
Monocytes Absolute: 0.4 10*3/uL (ref 0.1–1.0)
Monocytes Relative: 6 %
Neutro Abs: 3.6 10*3/uL (ref 1.7–7.7)
Neutrophils Relative %: 53 %
Platelets: 221 10*3/uL (ref 150–400)
RBC: 4.42 MIL/uL (ref 3.87–5.11)
RDW: 13.2 % (ref 11.5–15.5)
WBC: 6.8 10*3/uL (ref 4.0–10.5)
nRBC: 0 % (ref 0.0–0.2)

## 2018-09-26 LAB — HEMOGLOBIN A1C
Hgb A1c MFr Bld: 5.5 % (ref 4.8–5.6)
Mean Plasma Glucose: 111.15 mg/dL

## 2018-09-26 LAB — URINALYSIS, ROUTINE W REFLEX MICROSCOPIC
Bacteria, UA: NONE SEEN
Bilirubin Urine: NEGATIVE
Glucose, UA: NEGATIVE mg/dL
Ketones, ur: NEGATIVE mg/dL
Leukocytes,Ua: NEGATIVE
Nitrite: NEGATIVE
Protein, ur: NEGATIVE mg/dL
Specific Gravity, Urine: 1.017 (ref 1.005–1.030)
pH: 6 (ref 5.0–8.0)

## 2018-09-26 LAB — LIPID PANEL
Cholesterol: 172 mg/dL (ref 0–200)
HDL: 44 mg/dL (ref >40)
LDL Cholesterol: 102 mg/dL — ABNORMAL HIGH (ref 0–99)
Total CHOL/HDL Ratio: 3.9 RATIO
Triglycerides: 128 mg/dL (ref <150)
VLDL: 26 mg/dL (ref 0–40)

## 2018-09-26 LAB — COMPREHENSIVE METABOLIC PANEL
ALT: 13 U/L (ref 0–44)
AST: 13 U/L — ABNORMAL LOW (ref 15–41)
Albumin: 4 g/dL (ref 3.5–5.0)
Alkaline Phosphatase: 97 U/L (ref 38–126)
Anion gap: 9 (ref 5–15)
BUN: 18 mg/dL (ref 6–20)
CO2: 26 mmol/L (ref 22–32)
Calcium: 8.5 mg/dL — ABNORMAL LOW (ref 8.9–10.3)
Chloride: 104 mmol/L (ref 98–111)
Creatinine, Ser: 0.67 mg/dL (ref 0.44–1.00)
GFR calc Af Amer: >60 mL/min (ref >60)
GFR calc non Af Amer: >60 mL/min (ref >60)
Glucose, Bld: 102 mg/dL — ABNORMAL HIGH (ref 70–99)
Potassium: 4.1 mmol/L (ref 3.5–5.1)
Sodium: 139 mmol/L (ref 135–145)
Total Bilirubin: 0.3 mg/dL (ref 0.3–1.2)
Total Protein: 6.5 g/dL (ref 6.5–8.1)

## 2018-09-26 LAB — TSH: TSH: 2.247 u[IU]/mL (ref 0.350–4.500)

## 2018-09-26 NOTE — Telephone Encounter (Signed)
Called pt to let her know that orders are in, no answer, left voicemail.

## 2018-09-26 NOTE — Telephone Encounter (Signed)
Requested medication (s) are due for refill today:  yes  Requested medication (s) are on the active medication list:  yes  Future visit scheduled: yes  Last Refill: 10/24/2016  Requested Prescriptions  Pending Prescriptions Disp Refills   fluticasone (FLONASE) 50 MCG/ACT nasal spray [Pharmacy Med Name: FLUTICASONE PROP 50 MCG SPR 50 SUS] 48 g 3    Sig: PLACE 2 SPRAYS INTO BOTH NOSTRILS DAILY.     Ear, Nose, and Throat: Nasal Preparations - Corticosteroids Passed - 09/26/2018  8:02 AM      Passed - Valid encounter within last 12 months    Recent Outpatient Visits          2 months ago Acute pain of left knee   [facility] [place], Henrine Screws T, NP   1 year ago Mixed hyperlipidemia   [facility] Merrie Roof [place], [place]   1 year ago Acute left ankle pain   [facility] Merrie Roof [place], [place]   1 year ago Right calf pain   [facility] [place], [place]   1 year ago Acute right-sided low back pain with right-sided sciatica   [place], Megan P, DO      Future Appointments            In 3 days Wynetta Emery, Barb Merino, DO [employer], [place]   In 5 months [place], Gordan Payment [facility]         Signed Prescriptions Disp Refills   pantoprazole (PROTONIX) 40 MG tablet 90 tablet 0    Sig: TAKE 1 TABLET BY MOUTH DAILY     Gastroenterology: Proton Pump Inhibitors Passed - 09/26/2018  8:02 AM      Passed - Valid encounter within last 12 months    Recent Outpatient Visits          2 months ago Acute pain of left knee   [facility] [place], Henrine Screws T, NP   1 year ago Mixed hyperlipidemia   [place], [place], [place]   1 year ago Acute left ankle pain   [facility] Merrie Roof [place], [place]   1 year ago Right calf pain   [facility] Volney [place], [place]   1 year ago Acute right-sided low  back pain with right-sided sciatica   [place], Megan P, DO      Future Appointments            In 3 days Wynetta Emery, Barb Merino, DO [employer], [place]   In 5 months [place], Gordan Payment  [facility]          pravastatin (PRAVACHOL) 20 MG tablet 30 tablet 0    Sig: TAKE 1 TABLET BY MOUTH DAILY     Cardiovascular:  Antilipid - Statins Failed - 09/26/2018  8:02 AM      Failed - Total Cholesterol in normal range and within 360 days    Cholesterol, Total  Date Value Ref Range Status  08/12/2017 167 100 - 199 mg/dL Final         Failed - LDL in normal range and within 360 days    LDL Calculated  Date Value Ref Range Status  08/12/2017 96 0 - 99 mg/dL Final         Failed - HDL in normal range and within 360 days    HDL  Date Value Ref Range Status  08/12/2017 45 >39  mg/dL Final         Failed - Triglycerides in normal range and within 360 days    Triglycerides  Date Value Ref Range Status  08/12/2017 128 0 - 149 mg/dL Final         Passed - Patient is not pregnant      Passed - Valid encounter within last 12 months    Recent Outpatient Visits          2 months ago Acute pain of left knee   [facility] [place], Henrine Screws T, NP   1 year ago Mixed hyperlipidemia   [facility] [place], [place]   1 year ago Acute left ankle pain   [facility] Merrie Roof [place], [place]   1 year ago Right calf pain   [facility] Merrie Roof [place], [place]   1 year ago Acute right-sided low back pain with right-sided sciatica   [place], Barb Merino, DO      Future Appointments            In 3 days Wynetta Emery, Barb Merino, DO [employer], [place]   In 5 months [place], Gordan Payment [facility]

## 2018-09-26 NOTE — Telephone Encounter (Signed)
Orders placed.

## 2018-09-26 NOTE — Telephone Encounter (Signed)
Requested Prescriptions  Pending Prescriptions Disp Refills  . fluticasone (FLONASE) 50 MCG/ACT nasal spray [Pharmacy Med Name: FLUTICASONE PROP 50 MCG SPR 50 SUS] 48 g 3    Sig: PLACE 2 SPRAYS INTO BOTH NOSTRILS DAILY.     Ear, Nose, and Throat: Nasal Preparations - Corticosteroids Passed - 09/26/2018  8:02 AM      Passed - Valid encounter within last 12 months    Recent Outpatient Visits          2 months ago Acute pain of left knee   [facility] [place], Henrine Screws T, NP   1 year ago Mixed hyperlipidemia   [facility] Merrie Roof [place], [place]   1 year ago Acute left ankle pain   [facility] Merrie Roof [place], [place]   1 year ago Right calf pain   [facility] Merrie Roof [place], [place]   1 year ago Acute right-sided low back pain with right-sided sciatica   [place], Barb Merino, DO      Future Appointments            In 3 days Wynetta Emery, Barb Merino, DO [employer], [place]   In 5 months McGowan, Gordan Payment [facility]         . pantoprazole (PROTONIX) 40 MG tablet [Pharmacy Med Name: PANTOPRAZOLE SOD DR 40 MG T 40 TAB] 90 tablet 0    Sig: TAKE 1 TABLET BY MOUTH DAILY     Gastroenterology: Proton Pump Inhibitors Passed - 09/26/2018  8:02 AM      Passed - Valid encounter within last 12 months    Recent Outpatient Visits          2 months ago Acute pain of left knee   [facility] [place], Henrine Screws T, NP   1 year ago Mixed hyperlipidemia   [facility] [place], [place]   1 year ago Acute left ankle pain   [facility] Merrie Roof [place], [place]   1 year ago Right calf pain   [facility] Merrie Roof [place], [place]   1 year ago Acute right-sided low back pain with right-sided sciatica   [place], Barb Merino, DO      Future Appointments            In 3 days Wynetta Emery, Barb Merino, DO  [employer], [place]   In 5 months McGowan, Gordan Payment [facility]         . pravastatin (PRAVACHOL) 20 MG tablet [Pharmacy Med Name: PRAVASTATIN NA 20 MG TAB 20 TAB] 30 tablet 0    Sig: TAKE 1 TABLET BY MOUTH DAILY     Cardiovascular:  Antilipid - Statins Failed - 09/26/2018  8:02 AM      Failed - Total Cholesterol in normal range and within 360 days    Cholesterol, Total  Date Value Ref Range Status  08/12/2017 167 100 - 199 mg/dL Final         Failed - LDL in normal range and within 360 days    LDL Calculated  Date Value Ref Range Status  08/12/2017 96 0 - 99 mg/dL Final         Failed - HDL in normal range and within 360 days    HDL  Date Value Ref Range Status  08/12/2017 45 >39 mg/dL Final         Failed - Triglycerides in normal range  and within 360 days    Triglycerides  Date Value Ref Range Status  08/12/2017 128 0 - 149 mg/dL Final         Passed - Patient is not pregnant      Passed - Valid encounter within last 12 months    Recent Outpatient Visits          2 months ago Acute pain of left knee   [facility] [place], Henrine Screws T, NP   1 year ago Mixed hyperlipidemia   [facility] Merrie Roof [place], [place]   1 year ago Acute left ankle pain   [facility] Merrie Roof [place], [place]   1 year ago Right calf pain   [facility] Merrie Roof [place], [place]   1 year ago Acute right-sided low back pain with right-sided sciatica   [facility] Valerie Roys, DO      Future Appointments            In 3 days Wynetta Emery, Barb Merino, DO [employer], [place]   In 5 months McGowan, Gordan Payment [facility]         appt. overdue; scheduled appt. 09/29/18; 30 day courtesy refill given for Pravastatin

## 2018-09-26 NOTE — Telephone Encounter (Signed)
Pt called in for refills, set up virtual visit. She would like orders put in so that she can do labs at the hospital.

## 2018-09-26 NOTE — Telephone Encounter (Signed)
Pt would like to know if labs can be done at the hospital.

## 2018-09-27 LAB — MICROALBUMIN / CREATININE URINE RATIO
Creatinine, Urine: 88.4 mg/dL
Microalb Creat Ratio: <3 mg/g creat (ref 0–29)
Microalb, Ur: <3 ug/mL — ABNORMAL HIGH

## 2018-09-29 ENCOUNTER — Other Ambulatory Visit: Payer: Self-pay

## 2018-09-29 ENCOUNTER — Encounter: Payer: Self-pay | Admitting: Family Medicine

## 2018-09-29 ENCOUNTER — Ambulatory Visit (INDEPENDENT_AMBULATORY_CARE_PROVIDER_SITE_OTHER): Payer: 59 | Admitting: Family Medicine

## 2018-09-29 VITALS — BP 154/90 | HR 59 | Ht 66.0 in

## 2018-09-29 DIAGNOSIS — K219 Gastro-esophageal reflux disease without esophagitis: Secondary | ICD-10-CM | POA: Diagnosis not present

## 2018-09-29 DIAGNOSIS — E782 Mixed hyperlipidemia: Secondary | ICD-10-CM

## 2018-09-29 DIAGNOSIS — Z1239 Encounter for other screening for malignant neoplasm of breast: Secondary | ICD-10-CM

## 2018-09-29 DIAGNOSIS — F419 Anxiety disorder, unspecified: Secondary | ICD-10-CM | POA: Diagnosis not present

## 2018-09-29 DIAGNOSIS — R030 Elevated blood-pressure reading, without diagnosis of hypertension: Secondary | ICD-10-CM | POA: Diagnosis not present

## 2018-09-29 MED ORDER — PRAVASTATIN SODIUM 20 MG PO TABS: 20.0000 mg | ORAL_TABLET | Freq: Every day | ORAL | 1 refills | Status: DC

## 2018-09-29 MED ORDER — PANTOPRAZOLE SODIUM 40 MG PO TBEC: 40.0000 mg | DELAYED_RELEASE_TABLET | Freq: Every day | ORAL | 1 refills | Status: DC

## 2018-09-29 MED ORDER — MELOXICAM 15 MG PO TABS: 15.0000 mg | ORAL_TABLET | Freq: Every day | ORAL | 3 refills | Status: DC

## 2018-09-29 MED ORDER — FLUTICASONE PROPIONATE 50 MCG/ACT NA SUSP: 2.0000 | Freq: Every day | NASAL | 3 refills | Status: DC

## 2018-09-29 MED ORDER — BUSPIRONE HCL 10 MG PO TABS: 10.0000 mg | ORAL_TABLET | Freq: Three times a day (TID) | ORAL | 6 refills | Status: DC

## 2018-09-29 NOTE — Assessment & Plan Note (Signed)
Under good control on current regimen. Continue current regimen. Continue to monitor. Call with any concerns. Refills given. Labs checked and reviewed.

## 2018-09-29 NOTE — Progress Notes (Signed)
BP (!) 154/90   Pulse (!) 59   Ht 5\' 6"  (1.676 m)   BMI 43.74 kg/m    Subjective:    Patient ID: Latoya Lutz, female    DOB: 07-22-1970, 48 y.o.   MRN: [id number]  HPI: Latoya Lutz is a 48 y.o. female  Chief Complaint  Patient presents with  . Hyperlipidemia    pravastATIN 90 DAY SUPPLY REQUEST  . Anxiety   HYPERLIPIDEMIA Hyperlipidemia status: excellent compliance Satisfied with current treatment?  yes Side effects:  no Medication compliance: excellent compliance Past cholesterol meds: pravastatin Supplements: none Aspirin:  no The 10-year ASCVD risk score Mikey Bussing DC Jr., et al., 2013) is: 1.8%   Values used to calculate the score:     Age: 54 years     Sex: Female     Is Non-Hispanic African American: No     Diabetic: No     Tobacco smoker: No     Systolic Blood Pressure: 354 mmHg     Is BP treated: No     HDL Cholesterol: 44 mg/dL     Total Cholesterol: 172 mg/dL Chest pain:  no Coronary artery disease:  no  ANXIETY/STRESS- just with car rides. Does well with buspar. No concerns. Duration:controlled Anxious mood: no  Excessive worrying: no Irritability: no  Sweating: no Nausea: no Palpitations:no Hyperventilation: no Panic attacks: no Agoraphobia: no  Obscessions/compulsions: no Depressed mood: no Depression screen [facility] 2/9 09/29/2018 08/12/2017 08/08/2016 03/05/2016 11/14/2015  Decreased Interest 0 0 0 0 0  Down, Depressed, Hopeless 0 0 0 0 0  PHQ - 2 Score 0 0 0 0 0  Altered sleeping 0 0 - 0 -  Tired, decreased energy 0 0 - 0 -  Change in appetite 0 0 - 0 -  Feeling bad or failure about yourself  0 0 - 0 -  Trouble concentrating 0 0 - 0 -  Moving slowly or fidgety/restless 0 0 - 0 -  Suicidal thoughts 0 0 - 0 -  PHQ-9 Score 0 0 - 0 -  Difficult doing work/chores - Not difficult at all - - -   Anhedonia: no Weight changes: no Insomnia: no   Hypersomnia: no Fatigue/loss of energy: no Feelings of worthlessness: no Feelings of guilt:  no Impaired concentration/indecisiveness: no Suicidal ideations: no  Crying spells: no Recent Stressors/Life Changes: no   Relationship problems: no   Family stress: no     Financial stress: no    Job stress: no    Recent death/loss: no  GERD GERD control status: controlled  Satisfied with current treatment? yes Medication side effects: no  Medication compliance: excellent Dysphagia: no Odynophagia:  no Hematemesis: no Blood in stool: no EGD: no   Relevant past medical, surgical, family and social history reviewed and updated as indicated. Interim medical history since our last visit reviewed. Allergies and medications reviewed and updated.  Review of Systems  Constitutional: Negative.   Respiratory: Negative.   Cardiovascular: Negative.   Gastrointestinal: Negative.   Neurological: Negative.   Psychiatric/Behavioral: Negative.     Per HPI unless specifically indicated above     Objective:    BP (!) 154/90   Pulse (!) 59   Ht 5\' 6"  (1.676 m)   BMI 43.74 kg/m   Wt Readings from Last 3 Encounters:  09/22/18 271 lb (122.9 kg)  07/10/18 279 lb (126.6 kg)  04/30/18 281 lb 12.8 oz (127.8 kg)    Physical Exam Vitals signs and nursing note  reviewed.  Constitutional:      General: She is not in acute distress.    Appearance: Normal appearance. She is not ill-appearing, toxic-appearing or diaphoretic.  HENT:     Head: Normocephalic and atraumatic.     Right Ear: External ear normal.     Left Ear: External ear normal.     Nose: Nose normal.     Mouth/Throat:     Mouth: Mucous membranes are moist.     Pharynx: Oropharynx is clear.  Eyes:     General: No scleral icterus.       Right eye: No discharge.        Left eye: No discharge.     Conjunctiva/sclera: Conjunctivae normal.     Pupils: Pupils are equal, round, and reactive to light.  Neck:     Musculoskeletal: Normal range of motion.  Pulmonary:     Effort: Pulmonary effort is normal. No respiratory  distress.     Comments: Speaking in full sentences Musculoskeletal: Normal range of motion.  Skin:    Coloration: Skin is not jaundiced or pale.     Findings: No bruising, erythema, lesion or rash.  Neurological:     Mental Status: She is alert and oriented to person, place, and time. Mental status is at baseline.  Psychiatric:        Mood and Affect: Mood normal.        Behavior: Behavior normal.        Thought Content: Thought content normal.        Judgment: Judgment normal.     Results for orders placed or performed during the hospital encounter of 09/26/18  Urine Microalbumin w/creat. ratio  Result Value Ref Range   Microalb, Ur <3.0 (H) Not Estab. ug/mL   Microalb Creat Ratio <3 0 - 29 mg/g creat   Creatinine, Urine 88.4 Not Estab. mg/dL  Lipid Profile  Result Value Ref Range   Cholesterol 172 0 - 200 mg/dL   Triglycerides 128 <150 mg/dL   HDL 44 >40 mg/dL   Total CHOL/HDL Ratio 3.9 RATIO   VLDL 26 0 - 40 mg/dL   LDL Cholesterol 102 (H) 0 - 99 mg/dL  HgB A1c  Result Value Ref Range   Hgb A1c MFr Bld 5.5 4.8 - 5.6 %   Mean Plasma Glucose 111.15 mg/dL  Urinalysis, Routine w reflex microscopic  Result Value Ref Range   Color, Urine STRAW (A) YELLOW   APPearance CLEAR (A) CLEAR   Specific Gravity, Urine 1.017 1.005 - 1.030   pH 6.0 5.0 - 8.0   Glucose, UA NEGATIVE NEGATIVE mg/dL   Hgb urine dipstick SMALL (A) NEGATIVE   Bilirubin Urine NEGATIVE NEGATIVE   Ketones, ur NEGATIVE NEGATIVE mg/dL   Protein, ur NEGATIVE NEGATIVE mg/dL   Nitrite NEGATIVE NEGATIVE   Leukocytes,Ua NEGATIVE NEGATIVE   RBC / HPF 0-5 0 - 5 RBC/hpf   WBC, UA 0-5 0 - 5 WBC/hpf   Bacteria, UA NONE SEEN NONE SEEN   Squamous Epithelial / LPF 0-5 0 - 5   Mucus PRESENT   TSH  Result Value Ref Range   TSH 2.247 0.350 - 4.500 uIU/mL  Comprehensive metabolic panel  Result Value Ref Range   Sodium 139 135 - 145 mmol/L   Potassium 4.1 3.5 - 5.1 mmol/L   Chloride 104 98 - 111 mmol/L   CO2 26 22 -  32 mmol/L   Glucose, Bld 102 (H) 70 - 99 mg/dL   BUN 18 6 -  20 mg/dL   Creatinine, Ser 0.67 0.44 - 1.00 mg/dL   Calcium 8.5 (L) 8.9 - 10.3 mg/dL   Total Protein 6.5 6.5 - 8.1 g/dL   Albumin 4.0 3.5 - 5.0 g/dL   AST 13 (L) 15 - 41 U/L   ALT 13 0 - 44 U/L   Alkaline Phosphatase 97 38 - 126 U/L   Total Bilirubin 0.3 0.3 - 1.2 mg/dL   GFR calc non Af Amer >60 >60 mL/min   GFR calc Af Amer >60 >60 mL/min   Anion gap 9 5 - 15  CBC with Differential/Platelet  Result Value Ref Range   WBC 6.8 4.0 - 10.5 K/uL   RBC 4.42 3.87 - 5.11 MIL/uL   Hemoglobin 11.9 (L) 12.0 - 15.0 g/dL   HCT 37.6 36.0 - 46.0 %   MCV 85.1 80.0 - 100.0 fL   MCH 26.9 26.0 - 34.0 pg   MCHC 31.6 30.0 - 36.0 g/dL   RDW 13.2 11.5 - 15.5 %   Platelets 221 150 - 400 K/uL   nRBC 0.0 0.0 - 0.2 %   Neutrophils Relative % 53 %   Neutro Abs 3.6 1.7 - 7.7 K/uL   Lymphocytes Relative 40 %   Lymphs Abs 2.7 0.7 - 4.0 K/uL   Monocytes Relative 6 %   Monocytes Absolute 0.4 0.1 - 1.0 K/uL   Eosinophils Relative 1 %   Eosinophils Absolute 0.1 0.0 - 0.5 K/uL   Basophils Relative 0 %   Basophils Absolute 0.0 0.0 - 0.1 K/uL   Immature Granulocytes 0 %   Abs Immature Granulocytes 0.01 0.00 - 0.07 K/uL      Assessment & Plan:   Problem List Items Addressed This Visit      Digestive   Esophageal reflux   Relevant Medications   pantoprazole (PROTONIX) 40 MG tablet     Other   Morbid obesity (HCC)    Will work on diet and exercise with goal of losing 1-2lbs a week. Continue to monitor.       Elevated BP without diagnosis of hypertension    Microalbumin OK. Will work on [employer] and recheck 6 months. Call if running high at home.       Acute anxiety    Stable. Doing well. Refill of buspar given. Call with any concerns.       Relevant Medications   busPIRone (BUSPAR) 10 MG tablet   Hyperlipidemia - Primary    Under good control on current regimen. Continue current regimen. Continue to monitor. Call with any concerns.  Refills given. Labs checked and reviewed.        Relevant Medications   pravastatin (PRAVACHOL) 20 MG tablet    Other Visit Diagnoses    Breast cancer screening       Mammogram ordered.    Relevant Orders   MM DIGITAL SCREENING BILATERAL       Follow up plan: Return in about 6 months (around 03/31/2019) for Physical.    . This visit was completed via FaceTime due to the restrictions of the COVID-19 pandemic. All issues as above were discussed and addressed. Physical exam was done as above through visual confirmation on FaceTime. If it was felt that the patient should be evaluated in the office, they were directed there. The patient verbally consented to this visit. . Location of the patient: home . Location of the provider: home . Those involved with this call:  . Provider: Park Liter, DO . CMA: Rodena Piety  [place], [place] . Front Desk/Registration: Don Perking  . Time spent on call: 25 minutes on the phone discussing health concerns. 40 minutes total spent in review of patient's record and preparation of their chart.

## 2018-09-29 NOTE — Assessment & Plan Note (Signed)
Microalbumin OK. Will work on [employer] and recheck 6 months. Call if running high at home.

## 2018-09-29 NOTE — Assessment & Plan Note (Signed)
Will work on diet and exercise with goal of losing 1-2lbs a week. Continue to monitor.

## 2018-09-29 NOTE — Assessment & Plan Note (Signed)
Stable. Doing well. Refill of buspar given. Call with any concerns.

## 2018-09-30 ENCOUNTER — Ambulatory Visit: Payer: 59 | Admitting: Urology

## 2018-10-13 ENCOUNTER — Other Ambulatory Visit: Payer: Self-pay | Admitting: Family Medicine

## 2018-10-13 NOTE — Telephone Encounter (Signed)
Requested Prescriptions  Pending Prescriptions Disp Refills  . pravastatin (PRAVACHOL) 20 MG tablet [Pharmacy Med Name: PRAVASTATIN NA 20 MG TAB 20 TAB] 90 tablet 4    Sig: TAKE 1 TABLET BY MOUTH DAILY **MUST MAKE AN APPOINTMENT**     Cardiovascular:  Antilipid - Statins Failed - 10/13/2018  9:59 AM      Failed - LDL in normal range and within 360 days    LDL Calculated  Date Value Ref Range Status  08/12/2017 96 0 - 99 mg/dL Final   LDL Cholesterol  Date Value Ref Range Status  09/26/2018 102 (H) 0 - 99 mg/dL Final    Comment:           Total Cholesterol/HDL:CHD Risk Coronary Heart Disease Risk Table                     Men   Women  1/2 Average Risk   3.4   3.3  Average Risk       5.0   4.4  2 X Average Risk   9.6   7.1  3 X Average Risk  23.4   11.0        Use the calculated Patient Ratio above and the CHD Risk Table to determine the patient's CHD Risk.        ATP III CLASSIFICATION (LDL):  <100     mg/dL   Optimal  100-129  mg/dL   Near or Above                    Optimal  130-159  mg/dL   Borderline  160-189  mg/dL   High  >190     mg/dL   Very High Performed at [facility], [address] [place], [place] [address]          Passed - Total Cholesterol in normal range and within 360 days    Cholesterol, Total  Date Value Ref Range Status  08/12/2017 167 100 - 199 mg/dL Final   Cholesterol  Date Value Ref Range Status  09/26/2018 172 0 - 200 mg/dL Final         Passed - HDL in normal range and within 360 days    HDL  Date Value Ref Range Status  09/26/2018 44 >40 mg/dL Final  08/12/2017 45 >39 mg/dL Final         Passed - Triglycerides in normal range and within 360 days    Triglycerides  Date Value Ref Range Status  09/26/2018 128 <150 mg/dL Final         Passed - Patient is not pregnant      Passed - Valid encounter within last 12 months    Recent Outpatient Visits          2 weeks ago Mixed hyperlipidemia   [place], Megan P, DO   3 months ago Acute pain of left knee   [facility] [place], Barbaraann Faster, NP   1 year ago Mixed hyperlipidemia   [facility] [place], [place]   1 year ago Acute left ankle pain   [facility] Merrie Roof [place], [place]   1 year ago Right calf pain   [place], Lilia [place], [place]      Future Appointments            In 4 months McGowan, Shannon A, [place]

## 2018-10-29 ENCOUNTER — Telehealth: Payer: Self-pay | Admitting: Family Medicine

## 2018-10-29 ENCOUNTER — Encounter: Payer: Self-pay | Admitting: Family Medicine

## 2018-10-29 NOTE — Telephone Encounter (Signed)
Tried to reach patient. Mailbox full could not leave a message.

## 2019-01-08 DIAGNOSIS — H5213 Myopia, bilateral: Secondary | ICD-10-CM | POA: Diagnosis not present

## 2019-01-15 ENCOUNTER — Other Ambulatory Visit: Payer: Self-pay | Admitting: Nurse Practitioner

## 2019-02-23 NOTE — Progress Notes (Signed)
3:56 PM   [place] [birth year] [id number]  Referring provider: Valerie Roys, DO [place],  [place] [address]  Chief Complaint  Latoya Lutz presents with  . Follow-up    HPI: Latoya Lutz  is a 48 y/o female with a h/o urethral stricture and h/o nephrolithiasis who presents today for a urethral dilation.   History of urethral stricture She was referred to [place] in 2013 for increased frequency, increased urgency and feelings of not emptying her bladder by Dr. Ronette Deter, M.D.  Per Latoya Lutz, she was diagnosed by Dr. Madelin Headings with urethral dilation and had been receiving serial dilations every 4 to 6 months since 2013.   Today, she is complaining of nocturia.  Latoya Lutz denies any gross hematuria, dysuria or suprapubic/flank pain.  Latoya Lutz denies any fevers, chills, nausea or vomiting.  Her UA was negative.  History of nephrolithiasis She also has a history of right ureteral stones treated with ESWL and right URS/LL/right ureteral stent placement and stent removal in winter 2016.  A renal ultrasound completed on 07/13/2015 did not note any hydronephrosis or nephrolithiasis. KUB On 08/22/2015 noted probable interval passage of distal left ureteral calculi noted on prior KUB and a question of a small left renal calculus.  KUB on 11/09/2016 noted a 5 mm calcification projects over the lower pole of the left renal shadow consistent with nephrolithiasis.  CT Renal stone study performed on 04/05/2017 noted no nephroureterolithiasis.  No hydronephrosis.  Hepatic steatosis.   She has not noted any passage of fragments or flank pain.   PMH: Past Medical History:  Diagnosis Date  . Acid reflux   . Arthritis   . Candidal dermatitis 05/25/2014  . Cervical spine disease   . Chicken pox   . Chronic urethral narrowing    undergoing stretching  . Complication of anesthesia   . Endometriosis    Dr. Rushie Chestnut at [facility], removed   . Esophageal reflux 05/25/2014  . Gross hematuria 04/25/2015  . H/O  cystoscopy    normal  . Heart murmur   . Infection of urinary tract 09/11/2014  . Kidney stone 09/11/2014  . Kidney stones   . Microscopic hematuria 09/11/2014  . Obesity, morbid, BMI 40.0-49.9 ([place]) 04/10/2012  . PONV (postoperative nausea and vomiting)   . Right ureteral stone 04/28/2015  . Skin cancer   . Urinary retention     Surgical History: Past Surgical History:  Procedure Laterality Date  . ABDOMINAL HYSTERECTOMY     [facility] complete  . BLADDER SURGERY  07/2003  . CARPAL TUNNEL RELEASE Right   . CESAREAN SECTION  1998  . CHOLECYSTECTOMY    . CYSTOSCOPY W/ RETROGRADES Right 05/16/2015   Procedure: CYSTOSCOPY WITH RETROGRADE PYELOGRAM;  Surgeon: Nickie Retort, MD;  Location: [facility];  Service: Urology;  Laterality: Right;  . CYSTOSCOPY W/ URETERAL STENT PLACEMENT Right 05/16/2015   Procedure: CYSTOSCOPY WITH STENT REPLACEMENT;  Surgeon: Nickie Retort, MD;  Location: [facility];  Service: Urology;  Laterality: Right;  . CYSTOSCOPY WITH STENT PLACEMENT Right 04/29/2015   Procedure: CYSTOSCOPY WITH STENT PLACEMENT;  Surgeon: Nickie Retort, MD;  Location: [facility];  Service: Urology;  Laterality: Right;  . EXTRACORPOREAL SHOCK WAVE LITHOTRIPSY Left 06/02/2015   Procedure: EXTRACORPOREAL SHOCK WAVE LITHOTRIPSY (ESWL);  Surgeon: Hollice Espy, MD;  Location: [facility];  Service: Urology;  Laterality: Left;  . FOOT SURGERY  2003  . KNEE SURGERY  2005  . TONSILLECTOMY    . TONSILLECTOMY  lingual growth on vocal cord removal  . URETEROSCOPY WITH HOLMIUM LASER LITHOTRIPSY Right 05/16/2015   Procedure: URETEROSCOPY WITH HOLMIUM LASER LITHOTRIPSY;  Surgeon: Nickie Retort, MD;  Location: [facility];  Service: Urology;  Laterality: Right;    Home Medications:  Allergies as of 02/24/2019      Reactions   Atorvastatin Other (See Comments)   Leg cramps      Medication List       Accurate as of February 24, 2019  3:56 PM. If you have any questions, ask your nurse or  doctor.        acyclovir 400 MG tablet Commonly known as: ZOVIRAX TAKE 1 TABLET BY MOUTH 3 TIMES DAILY   azelastine 0.1 % nasal spray Commonly known as: ASTELIN Place 1 spray into both nostrils 2 (two) times daily.   busPIRone 10 MG tablet Commonly known as: BUSPAR Take 1 tablet (10 mg total) by mouth 3 (three) times daily.   CALCIUM 600 PO Take 1 tablet by mouth daily.   fluticasone 50 MCG/ACT nasal spray Commonly known as: FLONASE Place 2 sprays into both nostrils daily.   meloxicam 15 MG tablet Commonly known as: MOBIC Take 1 tablet (15 mg total) by mouth daily.   Menopause Formula Tabs Take by mouth. Reported on 11/04/2015   pantoprazole 40 MG tablet Commonly known as: PROTONIX Take 1 tablet (40 mg total) by mouth daily.   pravastatin 20 MG tablet Commonly known as: PRAVACHOL TAKE 1 TABLET BY MOUTH DAILY **MUST MAKE AN APPOINTMENT**   pseudoephedrine 30 MG tablet Commonly known as: SUDAFED Take 30 mg by mouth 3 (three) times daily.   Simethicone 180 MG Caps Take 1 capsule (180 mg total) by mouth 3 (three) times daily with meals.   traMADol 50 MG tablet Commonly known as: ULTRAM   triamcinolone cream 0.1 % Commonly known as: KENALOG Apply 1 application topically 2 (two) times daily as needed.   Vitamin D (Cholecalciferol) 25 MCG (1000 UT) Tabs Take by mouth.   vitamin E 400 UNIT capsule Take 400 Units by mouth daily.       Allergies:  Allergies  Allergen Reactions  . Atorvastatin Other (See Comments)    Leg cramps    Family History: Family History  Problem Relation Age of Onset  . Heart disease Mother        Pacemaker and defib  . Hypertension Mother   . Hyperlipidemia Mother   . Cancer Father   . COPD Father        Lung and brain cancer  . Cancer Maternal Grandmother        Colon  . Diabetes Paternal Grandmother   . Cirrhosis Paternal Grandmother   . Cancer Paternal Grandmother   . Kidney disease Neg Hx   . Bladder Cancer Neg Hx    . Breast cancer Neg Hx     Social History:  reports that she quit smoking about 10 years ago. Her smoking use included cigarettes. She has a 8.50 pack-year smoking history. She has never used smokeless tobacco. She reports that she does not drink alcohol or use drugs.  ROS: Urological Symptom Review UROLOGY Frequent Urination?: Yes Hard to postpone urination?: Yes Burning/pain with urination?: No Get up at night to urinate?: Yes Leakage of urine?: No Urine stream starts and stops?: No Trouble starting stream?: No Do you have to strain to urinate?: No Blood in urine?: No Urinary tract infection?: No Sexually transmitted disease?: No Injury to kidneys or bladder?: No  Painful intercourse?: No Weak stream?: No Currently pregnant?: No Vaginal bleeding?: No Last menstrual period?: n Gastrointestinal Nausea?: No Vomiting?: No Indigestion/heartburn?: No Diarrhea?: No Constipation?: No Constitutional Fever: No Night sweats?: No Weight loss?: No Fatigue?: No Skin Skin rash/lesions?: No Itching?: No Eyes Blurred vision?: No Double vision?: No Ears/Nose/Throat Sore throat?: No Sinus problems?: No Hematologic/Lymphatic Swollen glands?: No Easy bruising?: No Cardiovascular Leg swelling?: No Chest pain?: No Respiratory Cough?: No Shortness of breath?: No Endocrine Excessive thirst?: No Musculoskeletal Back pain?: No Joint pain?: No Neurological Headaches?: No Dizziness?: No Psychologic Depression?: No Anxiety?: No UROLOGY Frequent Urination?: Yes Hard to postpone urination?: Yes Burning/pain with urination?: No Get up at night to urinate?: Yes Leakage of urine?: No Urine stream starts and stops?: No Trouble starting stream?: No Do you have to strain to urinate?: No Blood in urine?: No Urinary tract infection?: No Sexually transmitted disease?: No Injury to kidneys or bladder?: No Painful intercourse?: No Weak stream?: No Currently pregnant?: No  Vaginal bleeding?: No Last menstrual period?: n Gastrointestinal Nausea?: No Vomiting?: No Indigestion/heartburn?: No Diarrhea?: No Constipation?: No Constitutional Fever: No Night sweats?: No Weight loss?: No Fatigue?: No Skin Skin rash/lesions?: No Itching?: No Eyes Blurred vision?: No Double vision?: No Ears/Nose/Throat Sore throat?: No Sinus problems?: No Hematologic/Lymphatic Swollen glands?: No Easy bruising?: No Cardiovascular Leg swelling?: No Chest pain?: No Respiratory Cough?: No Shortness of breath?: No Endocrine Excessive thirst?: No Musculoskeletal Back pain?: No Joint pain?: No Neurological Headaches?: No Dizziness?: No Psychologic Depression?: No Anxiety?: No   Physical Exam: Blood pressure (!) 156/82, pulse 68, height 5\' 6"  (1.676 m), weight 280 lb (127 kg). Constitutional:  Well nourished. Alert and oriented, No acute distress. HEENT: [place] AT, moist mucus membranes.  Trachea midline, no masses. Cardiovascular: No clubbing, cyanosis, or edema. Respiratory: Normal respiratory effort, no increased work of breathing. GU: No CVA tenderness.  No bladder fullness or masses.  Normal external genitalia, normal pubic hair distribution, no lesions.  Normal urethral meatus, no lesions, no prolapse, no discharge.   No urethral masses, tenderness and/or tenderness. No bladder fullness, tenderness or masses. Normal vagina mucosa, good estrogen effect, no discharge, no lesions.  Anus and perineum are without rashes or lesions.    Neurologic: Grossly intact, no focal deficits, moving all 4 extremities. Psychiatric: Normal mood and affect.   Laboratory Data: Urinalysis Component     Latest Ref Rng & Units 02/24/2019  Specific Gravity, UA     1.005 - 1.030 1.020  pH, UA     5.0 - 7.5 5.0  Color, UA     Yellow Yellow  Appearance Ur     Clear Clear  Leukocytes,UA     Negative Negative  Protein,UA     Negative/Trace Negative  Glucose, UA     Negative  Negative  Ketones, UA     Negative Negative  RBC, UA     Negative Negative  Bilirubin, UA     Negative Negative  Urobilinogen, Ur     0.2 - 1.0 mg/dL 0.2  Nitrite, UA     Negative Negative  Microscopic Examination      See below:   Component     Latest Ref Rng & Units 02/24/2019  WBC, UA     0 - 5 /hpf 0-5  RBC     0 - 2 /hpf None seen  Epithelial Cells (non renal)     0 - 10 /hpf 0-10  Bacteria, UA     None seen/Few None  seen   I have reviewed the labs.  Procedure:   Latoya Lutz is placed in stirrups and her urethral meatus and vulva are cleansed with Betadine.  2% Lidocaine jelly was inserted into her urethra.  I then dilated her with Elby Showers sounds to a 17f without difficultly.  She tolerated the procedure well.  She will return in 6 months.  Assessment & Plan:    1. Stricture of female urethra Latoya Lutz tolerated procedure RTC in 6 months for dilation  2. History of nephrolithiasis UA is negative for hematuria No reports of flank pain or gross hematuria   Return in about 6 months (around 08/24/2019) for urethral dilation .  Zara Council, PA-C  [facility] [address] [place], [place] [address] [phone]

## 2019-02-24 ENCOUNTER — Ambulatory Visit (INDEPENDENT_AMBULATORY_CARE_PROVIDER_SITE_OTHER): Payer: 59 | Admitting: Urology

## 2019-02-24 ENCOUNTER — Encounter: Payer: Self-pay | Admitting: Urology

## 2019-02-24 ENCOUNTER — Other Ambulatory Visit: Payer: Self-pay

## 2019-02-24 VITALS — BP 156/82 | HR 68 | Ht 66.0 in | Wt 280.0 lb

## 2019-02-24 DIAGNOSIS — N3592 Unspecified urethral stricture, female: Secondary | ICD-10-CM

## 2019-02-24 LAB — MICROSCOPIC EXAMINATION
Bacteria, UA: NONE SEEN
RBC, Urine: NONE SEEN /hpf (ref 0–2)

## 2019-02-24 LAB — URINALYSIS, COMPLETE
Bilirubin, UA: NEGATIVE
Glucose, UA: NEGATIVE
Ketones, UA: NEGATIVE
Leukocytes,UA: NEGATIVE
Nitrite, UA: NEGATIVE
Protein,UA: NEGATIVE
RBC, UA: NEGATIVE
Specific Gravity, UA: 1.02 (ref 1.005–1.030)
Urobilinogen, Ur: 0.2 mg/dL (ref 0.2–1.0)
pH, UA: 5 (ref 5.0–7.5)

## 2019-06-01 ENCOUNTER — Encounter: Payer: Self-pay | Admitting: Surgery

## 2019-06-01 ENCOUNTER — Other Ambulatory Visit: Payer: Self-pay

## 2019-06-01 ENCOUNTER — Encounter: Payer: Self-pay | Admitting: Family Medicine

## 2019-06-01 ENCOUNTER — Ambulatory Visit (INDEPENDENT_AMBULATORY_CARE_PROVIDER_SITE_OTHER): Payer: 59 | Admitting: Surgery

## 2019-06-01 VITALS — BP 159/94 | HR 86 | Temp 97.7°F | Resp 14 | Ht 66.0 in | Wt 296.4 lb

## 2019-06-01 DIAGNOSIS — L02214 Cutaneous abscess of groin: Secondary | ICD-10-CM | POA: Diagnosis not present

## 2019-06-01 DIAGNOSIS — R1909 Other intra-abdominal and pelvic swelling, mass and lump: Secondary | ICD-10-CM

## 2019-06-01 MED ORDER — SULFAMETHOXAZOLE-TRIMETHOPRIM 800-160 MG PO TABS: 1.0000 | ORAL_TABLET | Freq: Two times a day (BID) | ORAL | 0 refills | Status: DC

## 2019-06-01 NOTE — Patient Instructions (Addendum)
Please remove the packing, wash the area with soap and water, and replace new packing into wound. You will need to do this daily. Cover with a dry dressing and secure with tape. Please pick up your medication at the pharmacy.   Please see your appointment listed below.

## 2019-06-03 ENCOUNTER — Encounter: Payer: Self-pay | Admitting: Surgery

## 2019-06-03 NOTE — Progress Notes (Signed)
Patient ID: Latoya Lutz, female   DOB: 02/12/1971, 48 y.o.   MRN: [id number]  HPI Latoya Lutz is a 48 y.o. female seen in consultation at the request of Dr. Wynetta Emery.  About 3 to 4 days ago started having some left groin pain that intensified over the last 24 hours.  She reports that she picked up an object waiting about 30 pounds.  No real trauma.  No fevers no chills.  She thought this might be related to her hernia.  She reports that the pain is intermittent moderate in intensity and worsening with certain movements.  Pain is sharp.  She has a BMI of 47 and a history of a stricture on her urethra with multiple dilations.  HPI  Past Medical History:  Diagnosis Date  . Acid reflux   . Arthritis   . Candidal dermatitis 05/25/2014  . Cervical spine disease   . Chicken pox   . Chronic urethral narrowing    undergoing stretching  . Complication of anesthesia   . Endometriosis    Dr. Rushie Chestnut at [facility], removed   . Esophageal reflux 05/25/2014  . Gross hematuria 04/25/2015  . H/O cystoscopy    normal  . Heart murmur   . Infection of urinary tract 09/11/2014  . Kidney stone 09/11/2014  . Kidney stones   . Microscopic hematuria 09/11/2014  . Obesity, morbid, BMI 40.0-49.9 ([place]) 04/10/2012  . PONV (postoperative nausea and vomiting)   . Right ureteral stone 04/28/2015  . Skin cancer   . Urinary retention     Past Surgical History:  Procedure Laterality Date  . ABDOMINAL HYSTERECTOMY     [facility] complete  . BLADDER SURGERY  07/2003  . CARPAL TUNNEL RELEASE Right   . CESAREAN SECTION  1998  . CHOLECYSTECTOMY    . CYSTOSCOPY W/ RETROGRADES Right 05/16/2015   Procedure: CYSTOSCOPY WITH RETROGRADE PYELOGRAM;  Surgeon: Nickie Retort, MD;  Location: [facility];  Service: Urology;  Laterality: Right;  . CYSTOSCOPY W/ URETERAL STENT PLACEMENT Right 05/16/2015   Procedure: CYSTOSCOPY WITH STENT REPLACEMENT;  Surgeon: Nickie Retort, MD;  Location: [facility];  Service: Urology;   Laterality: Right;  . CYSTOSCOPY WITH STENT PLACEMENT Right 04/29/2015   Procedure: CYSTOSCOPY WITH STENT PLACEMENT;  Surgeon: Nickie Retort, MD;  Location: [facility];  Service: Urology;  Laterality: Right;  . EXTRACORPOREAL SHOCK WAVE LITHOTRIPSY Left 06/02/2015   Procedure: EXTRACORPOREAL SHOCK WAVE LITHOTRIPSY (ESWL);  Surgeon: Hollice Espy, MD;  Location: [facility];  Service: Urology;  Laterality: Left;  . FOOT SURGERY  2003  . KNEE SURGERY  2005  . TONSILLECTOMY    . TONSILLECTOMY     lingual growth on vocal cord removal  . URETEROSCOPY WITH HOLMIUM LASER LITHOTRIPSY Right 05/16/2015   Procedure: URETEROSCOPY WITH HOLMIUM LASER LITHOTRIPSY;  Surgeon: Nickie Retort, MD;  Location: [facility];  Service: Urology;  Laterality: Right;    Family History  Problem Relation Age of Onset  . Heart disease Mother        Pacemaker and defib  . Hypertension Mother   . Hyperlipidemia Mother   . Cancer Father   . COPD Father        Lung and brain cancer  . Cancer Maternal Grandmother        Colon  . Diabetes Paternal Grandmother   . Cirrhosis Paternal Grandmother   . Cancer Paternal Grandmother   . Kidney disease Neg Hx   . Bladder Cancer Neg Hx   .  Breast cancer Neg Hx     Social History Social History   Tobacco Use  . Smoking status: Former Smoker    Packs/day: 0.50    Years: 17.00    Pack years: 8.50    Types: Cigarettes    Quit date: 04/27/2008    Years since quitting: 11.1  . Smokeless tobacco: Never Used  . Tobacco comment: quit 7 years   Substance Use Topics  . Alcohol use: No    Alcohol/week: 0.0 standard drinks  . Drug use: No    Allergies  Allergen Reactions  . Atorvastatin Other (See Comments)    Leg cramps    Current Outpatient Medications  Medication Sig Dispense Refill  . acyclovir (ZOVIRAX) 400 MG tablet TAKE 1 TABLET BY MOUTH 3 TIMES DAILY 30 tablet 0  . azelastine (ASTELIN) 0.1 % nasal spray Place 1 spray into both nostrils 2 (two) times  daily.  12  . busPIRone (BUSPAR) 10 MG tablet Take 1 tablet (10 mg total) by mouth 3 (three) times daily. 90 tablet 6  . Calcium Carbonate (CALCIUM 600 PO) Take 1 tablet by mouth daily.     . fluticasone (FLONASE) 50 MCG/ACT nasal spray Place 2 sprays into both nostrils daily. 48 g 3  . meloxicam (MOBIC) 15 MG tablet Take 1 tablet (15 mg total) by mouth daily. 90 tablet 3  . Nutritional Supplements (MENOPAUSE FORMULA) TABS Take by mouth. Reported on 11/04/2015    . pantoprazole (PROTONIX) 40 MG tablet Take 1 tablet (40 mg total) by mouth daily. 90 tablet 1  . pravastatin (PRAVACHOL) 20 MG tablet TAKE 1 TABLET BY MOUTH DAILY **MUST MAKE AN APPOINTMENT** 90 tablet 4  . pseudoephedrine (SUDAFED) 30 MG tablet Take 30 mg by mouth 3 (three) times daily.    . Simethicone 180 MG CAPS Take 1 capsule (180 mg total) by mouth 3 (three) times daily with meals. 90 capsule 6  . traMADol (ULTRAM) 50 MG tablet     . triamcinolone cream (KENALOG) 0.1 % Apply 1 application topically 2 (two) times daily as needed.  2  . vitamin E 400 UNIT capsule Take 400 Units by mouth daily.    Marland Kitchen sulfamethoxazole-trimethoprim (BACTRIM DS) 800-160 MG tablet Take 1 tablet by mouth 2 (two) times daily. 20 tablet 0   No current facility-administered medications for this visit.     Review of Systems Full ROS  was asked and was negative except for the information on the HPI  Physical Exam Blood pressure (!) 159/94, pulse 86, temperature 97.7 F (36.5 C), temperature source Temporal, resp. rate 14, height 5\' 6"  (1.676 m), weight 296 lb 6.4 oz (134.4 kg), SpO2 96 %. CONSTITUTIONAL: No acute distress, morbidly obese EYES: Pupils are equal, round, , Sclera are non-icteric. LYMPH NODES:  Lymph nodes in the neck are normal. RESPIRATORY:  Lungs are clear. There is normal respiratory effort, with equal breath sounds bilaterally, and without pathologic use of accessory muscles. CARDIOVASCULAR: Heart is regular without murmurs, gallops,  or rubs. GI: The abdomen is  soft, nontender, and nondistended. There are no palpable masses. There is no hepatosplenomegaly. There are normal bowel sounds in all quadrants. There is evidence of a complex left groin abscess measuring approximately 2 x 2 cm tender to palpation.  No evidence of necrotizing infection GU: Rectal deferred.   MUSCULOSKELETAL: Normal muscle strength and tone. No cyanosis or edema.   SKIN: Turgor is good and there are no pathologic skin lesions or ulcers. NEUROLOGIC: Motor and sensation  is grossly normal. Cranial nerves are grossly intact. PSYCH:  Oriented to person, place and time. Affect is normal.  Data Reviewed  I have personally reviewed the patient's imaging, laboratory findings and medical records.    Assessment/Plan 48 year old female with left complex groin abscess in need for I&D.  Discussed with the patient in detail the need for I&D.  She agrees.  We will also give her a short course of antibiotic therapy and will see her back in a couple weeks.  A copy of this report was sent to the referring provider  Procedure Note 1.  I&D of left groin abscess complex  Anesthesia: Lidocaine 1% with epinephrine  After informed consent was obtained the patient was prepped and draped in the usual sterile fashion.  Lidocaine 1% with epi was infiltrated and using a 15 blade knife elliptical incision was created draining pus.  We broke down the loculations with a hemostat.  Wound was irrigated and packed with quarter inch packing.  No complications    Caroleen Hamman, MD FACS General Surgeon 06/03/2019, 10:18 AM

## 2019-06-07 LAB — ANAEROBIC AND AEROBIC CULTURE

## 2019-06-15 ENCOUNTER — Telehealth (INDEPENDENT_AMBULATORY_CARE_PROVIDER_SITE_OTHER): Payer: Self-pay | Admitting: Surgery

## 2019-06-15 ENCOUNTER — Other Ambulatory Visit: Payer: Self-pay

## 2019-06-15 DIAGNOSIS — Z09 Encounter for follow-up examination after completed treatment for conditions other than malignant neoplasm: Secondary | ICD-10-CM

## 2019-06-15 NOTE — Progress Notes (Signed)
Phone call with the pt S/p I/D complex groin abscess right cultures morganella sens to bactrim She is doing well No fevers or chills Covering wound w band-aid She will call us back for additional concerns.

## 2019-07-13 ENCOUNTER — Other Ambulatory Visit: Payer: Self-pay | Admitting: Family Medicine

## 2019-07-13 DIAGNOSIS — K219 Gastro-esophageal reflux disease without esophagitis: Secondary | ICD-10-CM

## 2019-07-13 NOTE — Telephone Encounter (Signed)
Requested Prescriptions  Pending Prescriptions Disp Refills  . pantoprazole (PROTONIX) 40 MG tablet [Pharmacy Med Name: PANTOPRAZOLE SOD DR 40 MG T 40 Tablet] 90 tablet 1    Sig: TAKE 1 TABLET BY MOUTH DAILY.     Gastroenterology: Proton Pump Inhibitors Passed - 07/13/2019  7:33 AM      Passed - Valid encounter within last 12 months    Recent Outpatient Visits          9 months ago Mixed hyperlipidemia   [place], DO   1 year ago Acute pain of left knee   [facility] Venita Lick, NP   1 year ago Mixed hyperlipidemia   [place], [place]   2 years ago Acute left ankle pain   [place], [place]   2 years ago Right calf pain   [place], Lilia Argue, [place]      Future Appointments            In 1 month [place], Shannon A, [place]

## 2019-07-23 ENCOUNTER — Other Ambulatory Visit: Payer: Self-pay | Admitting: Family Medicine

## 2019-08-03 ENCOUNTER — Other Ambulatory Visit: Payer: Self-pay | Admitting: Family Medicine

## 2019-08-03 ENCOUNTER — Ambulatory Visit
Admission: RE | Admit: 2019-08-03 | Discharge: 2019-08-03 | Disposition: A | Discharge status: Home / Self Care | Payer: 59 | Source: Ambulatory Visit | Attending: Family Medicine | Admitting: Family Medicine

## 2019-08-03 DIAGNOSIS — Z1239 Encounter for other screening for malignant neoplasm of breast: Secondary | ICD-10-CM

## 2019-08-03 DIAGNOSIS — Z1231 Encounter for screening mammogram for malignant neoplasm of breast: Secondary | ICD-10-CM | POA: Diagnosis not present

## 2019-08-06 ENCOUNTER — Encounter: Payer: 59 | Admitting: Family Medicine

## 2019-08-10 ENCOUNTER — Ambulatory Visit (INDEPENDENT_AMBULATORY_CARE_PROVIDER_SITE_OTHER): Payer: 59 | Admitting: Family Medicine

## 2019-08-10 ENCOUNTER — Encounter: Payer: Self-pay | Admitting: Family Medicine

## 2019-08-10 ENCOUNTER — Other Ambulatory Visit: Payer: Self-pay

## 2019-08-10 VITALS — BP 147/72 | HR 64 | Temp 97.9°F | Ht 63.0 in | Wt 294.6 lb

## 2019-08-10 DIAGNOSIS — R030 Elevated blood-pressure reading, without diagnosis of hypertension: Secondary | ICD-10-CM

## 2019-08-10 DIAGNOSIS — G2581 Restless legs syndrome: Secondary | ICD-10-CM | POA: Diagnosis not present

## 2019-08-10 DIAGNOSIS — E782 Mixed hyperlipidemia: Secondary | ICD-10-CM | POA: Diagnosis not present

## 2019-08-10 DIAGNOSIS — R310 Gross hematuria: Secondary | ICD-10-CM | POA: Diagnosis not present

## 2019-08-10 DIAGNOSIS — Z0000 Encounter for general adult medical examination without abnormal findings: Secondary | ICD-10-CM

## 2019-08-10 LAB — UA/M W/RFLX CULTURE, ROUTINE
Bilirubin, UA: NEGATIVE
Glucose, UA: NEGATIVE
Ketones, UA: NEGATIVE
Leukocytes,UA: NEGATIVE
Nitrite, UA: NEGATIVE
Protein,UA: NEGATIVE
RBC, UA: NEGATIVE
Specific Gravity, UA: >1.03 — ABNORMAL HIGH (ref 1.005–1.030)
Urobilinogen, Ur: 0.2 mg/dL (ref 0.2–1.0)
pH, UA: 5.5 (ref 5.0–7.5)

## 2019-08-10 LAB — BAYER DCA HB A1C WAIVED: HB A1C (BAYER DCA - WAIVED): 5.6 % (ref <7.0)

## 2019-08-10 MED ORDER — SAXENDA 18 MG/3ML ~~LOC~~ SOPN: PEN_INJECTOR | SUBCUTANEOUS | 3 refills | Status: DC

## 2019-08-10 MED ORDER — BUSPIRONE HCL 10 MG PO TABS: 10.0000 mg | ORAL_TABLET | Freq: Three times a day (TID) | ORAL | 6 refills | Status: DC

## 2019-08-10 MED ORDER — ACYCLOVIR 400 MG PO TABS: 400.0000 mg | ORAL_TABLET | Freq: Three times a day (TID) | ORAL | 12 refills | Status: DC

## 2019-08-10 MED ORDER — ROPINIROLE HCL 0.25 MG PO TABS: 0.2500 mg | ORAL_TABLET | Freq: Every day | ORAL | 3 refills | Status: DC

## 2019-08-10 MED ORDER — MELOXICAM 15 MG PO TABS: 15.0000 mg | ORAL_TABLET | Freq: Every day | ORAL | 3 refills | Status: DC

## 2019-08-10 MED ORDER — PRAVASTATIN SODIUM 20 MG PO TABS: ORAL_TABLET | ORAL | 4 refills | Status: DC

## 2019-08-10 NOTE — Progress Notes (Signed)
BP (!) 147/72 (BP Location: Left Arm, Patient Position: Sitting, Cuff Size: Normal)   Pulse 64   Temp 97.9 F (36.6 C) (Oral)   Ht 5\' 3"  (1.6 m)   Wt 294 lb 9.6 oz (133.6 kg)   SpO2 99%   BMI 52.19 kg/m    Subjective:    Patient ID: Latoya Lutz, female    DOB: August 28, 1970, 49 y.o.   MRN: [id number]  HPI: Latoya Lutz is a 49 y.o. female presenting on 08/10/2019 for comprehensive medical examination. Current medical complaints include:  RESTLESS LEGS Duration: months Discomfort description:  Creeping, crawling, ill-defined and cramping Pain: yes Location: calves Bilateral: yes Symmetric: yes Severity: moderate Onset:  gradual Frequency:  at night Symptoms only occur while legs at rest: yes Sudden unintentional leg jerking: yes Bed partner bothered by leg movements: yes LE numbness: no Decreased sensation: no Weakness: no Insomnia: no Daytime somnolence: yes Fatigue: yes Status: worse  HYPERLIPIDEMIA Hyperlipidemia status: excellent compliance Satisfied with current treatment?  yes Side effects:  no Medication compliance: excellent compliance Past cholesterol meds: pravastatin Supplements: none Aspirin:  no The 10-year ASCVD risk score Mikey Bussing DC Jr., et al., 2013) is: 1.3%   Values used to calculate the score:     Age: 17 years     Sex: Female     Is Non-Hispanic African American: No     Diabetic: No     Tobacco smoker: No     Systolic Blood Pressure: [id number] mmHg     Is BP treated: No     HDL Cholesterol: 45 mg/dL     Total Cholesterol: 154 mg/dL Chest pain:  no Coronary artery disease:  no  WEIGHT GAIN Duration: chronic Previous attempts at weight loss: yes- diet, exercise, weight watchers, saxenda Complications of obesity: HLD, knee arthritis, GERD  Peak weight: current Weight loss goal: 50lbs for surgery Weight loss to date: none Requesting obesity pharmacotherapy: yes  Menopausal Symptoms: no  Depression Screen done today and results  listed below:  Depression screen [facility] 2/9 09/29/2018 08/12/2017 08/08/2016 03/05/2016 11/14/2015  Decreased Interest 0 0 0 0 0  Down, Depressed, Hopeless 0 0 0 0 0  PHQ - 2 Score 0 0 0 0 0  Altered sleeping 0 0 - 0 -  Tired, decreased energy 0 0 - 0 -  Change in appetite 0 0 - 0 -  Feeling bad or failure about yourself  0 0 - 0 -  Trouble concentrating 0 0 - 0 -  Moving slowly or fidgety/restless 0 0 - 0 -  Suicidal thoughts 0 0 - 0 -  PHQ-9 Score 0 0 - 0 -  Difficult doing work/chores - Not difficult at all - - -    Past Medical History:  Past Medical History:  Diagnosis Date  . Acid reflux   . Arthritis   . Candidal dermatitis 05/25/2014  . Cervical spine disease   . Chicken pox   . Chronic urethral narrowing    undergoing stretching  . Complication of anesthesia   . Endometriosis    Dr. Rushie Chestnut at [facility], removed   . Esophageal reflux 05/25/2014  . Gross hematuria 04/25/2015  . H/O cystoscopy    normal  . Heart murmur   . Infection of urinary tract 09/11/2014  . Kidney stone 09/11/2014  . Kidney stones   . Microscopic hematuria 09/11/2014  . Obesity, morbid, BMI 40.0-49.9 ([place]) 04/10/2012  . PONV (postoperative nausea and vomiting)   . Right ureteral stone  04/28/2015  . Skin cancer   . Urinary retention     Surgical History:  Past Surgical History:  Procedure Laterality Date  . ABDOMINAL HYSTERECTOMY     [facility] complete  . BLADDER SURGERY  07/2003  . CARPAL TUNNEL RELEASE Right   . CESAREAN SECTION  1998  . CHOLECYSTECTOMY    . CYSTOSCOPY W/ RETROGRADES Right 05/16/2015   Procedure: CYSTOSCOPY WITH RETROGRADE PYELOGRAM;  Surgeon: Nickie Retort, MD;  Location: [facility] OR[facility];  Service: Urology;  Laterality: Right;  . CYSTOSCOPY W/ URETERAL STENT PLACEMENT Right 05/16/2015   Procedure: CYSTOSCOPY WITH STENT REPLACEMENT;  Surgeon: Nickie Retort, MD;  Location: [facility] OR[facility];  Service: Urology;  Laterality: Right;  . CYSTOSCOPY WITH STENT PLACEMENT Right 04/29/2015   Procedure:  CYSTOSCOPY WITH STENT PLACEMENT;  Surgeon: Nickie Retort, MD;  Location: [facility] OR[facility];  Service: Urology;  Laterality: Right;  . EXTRACORPOREAL SHOCK WAVE LITHOTRIPSY Left 06/02/2015   Procedure: EXTRACORPOREAL SHOCK WAVE LITHOTRIPSY (ESWL);  Surgeon: Hollice Espy, MD;  Location: [facility] OR[facility];  Service: Urology;  Laterality: Left;  . FOOT SURGERY  2003  . KNEE SURGERY  2005  . TONSILLECTOMY    . TONSILLECTOMY     lingual growth on vocal cord removal  . URETEROSCOPY WITH HOLMIUM LASER LITHOTRIPSY Right 05/16/2015   Procedure: URETEROSCOPY WITH HOLMIUM LASER LITHOTRIPSY;  Surgeon: Nickie Retort, MD;  Location: [facility];  Service: Urology;  Laterality: Right;    Medications:  Current Outpatient Medications on File Prior to Visit  Medication Sig  . azelastine (ASTELIN) 0.1 % nasal spray Place 1 spray into both nostrils 2 (two) times daily.  . Calcium Carbonate (CALCIUM 600 PO) Take 1 tablet by mouth daily.   . fluticasone (FLONASE) 50 MCG/ACT nasal spray Place 2 sprays into both nostrils daily.  . Nutritional Supplements (MENOPAUSE FORMULA) TABS Take by mouth. Reported on 11/04/2015  . pantoprazole (PROTONIX) 40 MG tablet TAKE 1 TABLET BY MOUTH DAILY.  Marland Kitchen pseudoephedrine (SUDAFED) 30 MG tablet Take 30 mg by mouth 3 (three) times daily.  . Simethicone 180 MG CAPS Take 1 capsule (180 mg total) by mouth 3 (three) times daily with meals.  . triamcinolone cream (KENALOG) 0.1 % Apply 1 application topically 2 (two) times daily as needed.  . vitamin E 400 UNIT capsule Take 400 Units by mouth daily.  . traMADol (ULTRAM) 50 MG tablet    No current facility-administered medications on file prior to visit.    Allergies:  Allergies  Allergen Reactions  . Atorvastatin Other (See Comments)    Leg cramps    Social History:  Social History   Socioeconomic History  . Marital status: Married    Spouse name: Not on file  . Number of children: Not on file  . Years of education: Not on file  .  Highest education level: Not on file  Occupational History  . Not on file  Tobacco Use  . Smoking status: Former Smoker    Packs/day: 0.50    Years: 17.00    Pack years: 8.50    Types: Cigarettes    Quit date: 04/27/2008    Years since quitting: 11.2  . Smokeless tobacco: Never Used  . Tobacco comment: quit 7 years   Substance and Sexual Activity  . Alcohol use: No    Alcohol/week: 0.0 standard drinks  . Drug use: No  . Sexual activity: Yes  Other Topics Concern  . Not on file  Social History Narrative   Lives in [place]  with son 15YO and fiance      Work - [employer]   Social Determinants of Radio broadcast assistant Strain:   . Difficulty of Paying Living Expenses: Not on file  Food Insecurity:   . Worried About Charity fundraiser in the Last Year: Not on file  . Ran Out of Food in the Last Year: Not on file  Transportation Needs:   . Lack of Transportation (Medical): Not on file  . Lack of Transportation (Non-Medical): Not on file  Physical Activity:   . Days of Exercise per Week: Not on file  . Minutes of Exercise per Session: Not on file  Stress:   . Feeling of Stress : Not on file  Social Connections:   . Frequency of Communication with Friends and Family: Not on file  . Frequency of Social Gatherings with Friends and Family: Not on file  . Attends Religious Services: Not on file  . Active Member of Clubs or Organizations: Not on file  . Attends Archivist Meetings: Not on file  . Marital Status: Not on file  Intimate Partner Violence:   . Fear of Current or Ex-Partner: Not on file  . Emotionally Abused: Not on file  . Physically Abused: Not on file  . Sexually Abused: Not on file   Social History   Tobacco Use  Smoking Status Former Smoker  . Packs/day: 0.50  . Years: 17.00  . Pack years: 8.50  . Types: Cigarettes  . Quit date: 04/27/2008  . Years since quitting: 11.2  Smokeless Tobacco Never Used  Tobacco Comment   quit 7 years     Social History   Substance and Sexual Activity  Alcohol Use No  . Alcohol/week: 0.0 standard drinks    Family History:  Family History  Problem Relation Age of Onset  . Heart disease Mother        Pacemaker and defib  . Hypertension Mother   . Hyperlipidemia Mother   . Cancer Father   . COPD Father        Lung and brain cancer  . Cancer Maternal Grandmother        Colon  . Diabetes Paternal Grandmother   . Cirrhosis Paternal Grandmother   . Cancer Paternal Grandmother   . Kidney disease Neg Hx   . Bladder Cancer Neg Hx   . Breast cancer Neg Hx     Past medical history, surgical history, medications, allergies, family history and social history reviewed with patient today and changes made to appropriate areas of the chart.   Review of Systems  Constitutional: Negative.   HENT: Negative.   Eyes: Negative.   Respiratory: Negative.   Cardiovascular: Positive for leg swelling. Negative for chest pain, palpitations, orthopnea, claudication and PND.  Gastrointestinal: Negative.   Genitourinary: Negative.   Musculoskeletal: Negative.   Skin: Negative.   Neurological: Negative.   Endo/Heme/Allergies: Negative.   Psychiatric/Behavioral: Negative.     All other ROS negative except what is listed above and in the HPI.      Objective:    BP (!) 147/72 (BP Location: Left Arm, Patient Position: Sitting, Cuff Size: Normal)   Pulse 64   Temp 97.9 F (36.6 C) (Oral)   Ht 5\' 3"  (1.6 m)   Wt 294 lb 9.6 oz (133.6 kg)   SpO2 99%   BMI 52.19 kg/m   Wt Readings from Last 3 Encounters:  08/10/19 294 lb 9.6 oz (133.6 kg)  06/01/19 296 lb  6.4 oz (134.4 kg)  02/24/19 280 lb (127 kg)    Physical Exam Vitals and nursing note reviewed.  Constitutional:      General: She is not in acute distress.    Appearance: Normal appearance. She is obese. She is not ill-appearing, toxic-appearing or diaphoretic.  HENT:     Head: Normocephalic and atraumatic.     Right Ear: Tympanic  membrane, ear canal and external ear normal. There is no impacted cerumen.     Left Ear: Tympanic membrane, ear canal and external ear normal. There is no impacted cerumen.     Nose: Nose normal. No congestion or rhinorrhea.     Mouth/Throat:     Mouth: Mucous membranes are moist.     Pharynx: Oropharynx is clear. No oropharyngeal exudate or posterior oropharyngeal erythema.  Eyes:     General: No scleral icterus.       Right eye: No discharge.        Left eye: No discharge.     Extraocular Movements: Extraocular movements intact.     Conjunctiva/sclera: Conjunctivae normal.     Pupils: Pupils are equal, round, and reactive to light.  Neck:     Vascular: No carotid bruit.  Cardiovascular:     Rate and Rhythm: Normal rate and regular rhythm.     Pulses: Normal pulses.     Heart sounds: No murmur. No friction rub. No gallop.   Pulmonary:     Effort: Pulmonary effort is normal. No respiratory distress.     Breath sounds: Normal breath sounds. No stridor. No wheezing, rhonchi or rales.  Chest:     Chest wall: No tenderness.  Abdominal:     General: Abdomen is flat. Bowel sounds are normal. There is no distension.     Palpations: Abdomen is soft. There is no mass.     Tenderness: There is no abdominal tenderness. There is no right CVA tenderness, left CVA tenderness, guarding or rebound.     Hernia: No hernia is present.  Genitourinary:    Comments: Breast and pelvic exams deferred with shared decision making Musculoskeletal:        General: No swelling, tenderness, deformity or signs of injury.     Cervical back: Normal range of motion and neck supple. No rigidity. No muscular tenderness.     Right lower leg: No edema.     Left lower leg: No edema.  Lymphadenopathy:     Cervical: No cervical adenopathy.  Skin:    General: Skin is warm and dry.     Capillary Refill: Capillary refill takes less than 2 seconds.     Coloration: Skin is not jaundiced or pale.     Findings: No  bruising, erythema, lesion or rash.  Neurological:     General: No focal deficit present.     Mental Status: She is alert and oriented to person, place, and time. Mental status is at baseline.     Cranial Nerves: No cranial nerve deficit.     Sensory: No sensory deficit.     Motor: No weakness.     Coordination: Coordination normal.     Gait: Gait normal.     Deep Tendon Reflexes: Reflexes normal.  Psychiatric:        Mood and Affect: Mood normal.        Behavior: Behavior normal.        Thought Content: Thought content normal.        Judgment: Judgment normal.  Results for orders placed or performed in visit on 08/10/19  Bayer DCA Hb A1c Waived  Result Value Ref Range   HB A1C (BAYER DCA - WAIVED) 5.6 <7.0 %  CBC with Differential/Platelet  Result Value Ref Range   WBC 6.5 3.4 - 10.8 x10E3/uL   RBC 4.20 3.77 - 5.28 x10E6/uL   Hemoglobin 11.6 11.1 - 15.9 g/dL   Hematocrit 34.8 34.0 - 46.6 %   MCV 83 79 - 97 fL   MCH 27.6 26.6 - 33.0 pg   MCHC 33.3 31.5 - 35.7 g/dL   RDW 13.6 11.7 - 15.4 %   Platelets 232 150 - 450 x10E3/uL   Neutrophils 53 Not Estab. %   Lymphs 37 Not Estab. %   Monocytes 7 Not Estab. %   Eos 2 Not Estab. %   Basos 1 Not Estab. %   Neutrophils Absolute 3.5 1.4 - 7.0 x10E3/uL   Lymphocytes Absolute 2.4 0.7 - 3.1 x10E3/uL   Monocytes Absolute 0.4 0.1 - 0.9 x10E3/uL   EOS (ABSOLUTE) 0.1 0.0 - 0.4 x10E3/uL   Basophils Absolute 0.0 0.0 - 0.2 x10E3/uL   Immature Granulocytes 0 Not Estab. %   Immature Grans (Abs) 0.0 0.0 - 0.1 x10E3/uL  Comprehensive metabolic panel  Result Value Ref Range   Glucose 106 (H) 65 - 99 mg/dL   BUN 13 6 - 24 mg/dL   Creatinine, Ser 0.72 0.57 - 1.00 mg/dL   GFR calc non Af Amer 99 >59 mL/min/1.73   GFR calc Af Amer 114 >59 mL/min/1.73   BUN/Creatinine Ratio 18 9 - 23   Sodium 140 134 - 144 mmol/L   Potassium 3.9 3.5 - 5.2 mmol/L   Chloride 104 96 - 106 mmol/L   CO2 24 20 - 29 mmol/L   Calcium 8.9 8.7 - 10.2 mg/dL    Total Protein 5.9 (L) 6.0 - 8.5 g/dL   Albumin 4.0 3.8 - 4.8 g/dL   Globulin, Total 1.9 1.5 - 4.5 g/dL   Albumin/Globulin Ratio 2.1 1.2 - 2.2   Bilirubin Total 0.2 0.0 - 1.2 mg/dL   Alkaline Phosphatase 108 39 - 117 IU/L   AST 16 0 - 40 IU/L   ALT 11 0 - 32 IU/L  Lipid Panel w/o Chol/HDL Ratio  Result Value Ref Range   Cholesterol, Total 154 100 - 199 mg/dL   Triglycerides 120 0 - 149 mg/dL   HDL 45 >39 mg/dL   VLDL Cholesterol Cal 22 5 - 40 mg/dL   LDL Chol Calc (NIH) 87 0 - 99 mg/dL  TSH  Result Value Ref Range   TSH 2.150 0.450 - 4.500 uIU/mL  UA/M w/rflx Culture, Routine   Specimen: Blood   BLD  Result Value Ref Range   Specific Gravity, UA >1.030 (H) 1.005 - 1.030   pH, UA 5.5 5.0 - 7.5   Color, UA Yellow Yellow   Appearance Ur Clear Clear   Leukocytes,UA Negative Negative   Protein,UA Negative Negative/Trace   Glucose, UA Negative Negative   Ketones, UA Negative Negative   RBC, UA Negative Negative   Bilirubin, UA Negative Negative   Urobilinogen, Ur 0.2 0.2 - 1.0 mg/dL   Nitrite, UA Negative Negative      Assessment & Plan:   Problem List Items Addressed This Visit      Genitourinary   Gross hematuria    Will repeat UA. Await results.       Relevant Orders   CBC with Differential/Platelet (Completed)   UA/M  w/rflx Culture, Routine (Completed)     Other   Morbid obesity ([place])    Needs to lose 50lbs to have her knee replacement. Rx for saxenda given today. Discussed the importance of diet and exercise. Goal of losing about 1-2lbs per week. Recheck 1 month.       Relevant Medications   Liraglutide -Weight Management (SAXENDA) 18 MG/3ML SOPN   Other Relevant Orders   Bayer DCA Hb A1c Waived (Completed)   CBC with Differential/Platelet (Completed)   TSH (Completed)   Elevated BP without diagnosis of hypertension    Continues to be borderline. Will start saxenda to work on weight loss- recheck 1 month.       Hyperlipidemia    Under good control on  current regimen. Continue current regimen. Continue to monitor. Call with any concerns. Refills given. Labs drawn today.       Relevant Medications   pravastatin (PRAVACHOL) 20 MG tablet   Other Relevant Orders   CBC with Differential/Platelet (Completed)   Comprehensive metabolic panel (Completed)   Lipid Panel w/o Chol/HDL Ratio (Completed)    Other Visit Diagnoses    Routine general medical examination at a health care facility    -  Primary   Vaccines up to date. Screening labs checked today. Pap N/A. Mammogram up to date. Continue diet and exercise. Call with any concerns.    Relevant Orders   Bayer DCA Hb A1c Waived (Completed)   CBC with Differential/Platelet (Completed)   Comprehensive metabolic panel (Completed)   Lipid Panel w/o Chol/HDL Ratio (Completed)   TSH (Completed)   UA/M w/rflx Culture, Routine (Completed)   RLS (restless legs syndrome)       Will start requip and check labs and recheck 1 month. Call with any concerns.        Follow up plan: Return in about 4 weeks (around 09/07/2019).   LABORATORY TESTING:  - Pap smear: not applicable  IMMUNIZATIONS:   - Tdap: Tetanus vaccination status reviewed: last tetanus booster within 10 years. - Influenza: Up to date - Pneumovax: Up to date  SCREENING: -Mammogram: Up to date  - Colonoscopy: Not applicable   PATIENT COUNSELING:   Advised to take 1 mg of folate supplement per day if capable of pregnancy.   Sexuality: Discussed sexually transmitted diseases, partner selection, use of condoms, avoidance of unintended pregnancy  and contraceptive alternatives.   Advised to avoid cigarette smoking.  I discussed with the patient that most people either abstain from alcohol or drink within safe limits (<=14/week and <=4 drinks/occasion for males, <=7/weeks and <= 3 drinks/occasion for females) and that the risk for alcohol disorders and other health effects rises proportionally with the number of drinks per week and  how often a drinker exceeds daily limits.  Discussed cessation/primary prevention of drug use and availability of treatment for abuse.   Diet: Encouraged to adjust caloric intake to maintain  or achieve ideal body weight, to reduce intake of dietary saturated fat and total fat, to limit sodium intake by avoiding high sodium foods and not adding table salt, and to maintain adequate dietary potassium and calcium preferably from fresh fruits, vegetables, and low-fat dairy products.    stressed the importance of regular exercise  Injury prevention: Discussed safety belts, safety helmets, smoke detector, smoking near bedding or upholstery.   Dental health: Discussed importance of regular tooth brushing, flossing, and dental visits.    NEXT PREVENTATIVE PHYSICAL DUE IN 1 YEAR. Return in about 4 weeks (around 09/07/2019).

## 2019-08-11 ENCOUNTER — Telehealth: Payer: Self-pay

## 2019-08-11 LAB — COMPREHENSIVE METABOLIC PANEL
ALT: 11 IU/L (ref 0–32)
AST: 16 IU/L (ref 0–40)
Albumin/Globulin Ratio: 2.1 (ref 1.2–2.2)
Albumin: 4 g/dL (ref 3.8–4.8)
Alkaline Phosphatase: 108 IU/L (ref 39–117)
BUN/Creatinine Ratio: 18 (ref 9–23)
BUN: 13 mg/dL (ref 6–24)
Bilirubin Total: 0.2 mg/dL (ref 0.0–1.2)
CO2: 24 mmol/L (ref 20–29)
Calcium: 8.9 mg/dL (ref 8.7–10.2)
Chloride: 104 mmol/L (ref 96–106)
Creatinine, Ser: 0.72 mg/dL (ref 0.57–1.00)
GFR calc Af Amer: 114 mL/min/{1.73_m2} (ref >59)
GFR calc non Af Amer: 99 mL/min/{1.73_m2} (ref >59)
Globulin, Total: 1.9 g/dL (ref 1.5–4.5)
Glucose: 106 mg/dL — ABNORMAL HIGH (ref 65–99)
Potassium: 3.9 mmol/L (ref 3.5–5.2)
Sodium: 140 mmol/L (ref 134–144)
Total Protein: 5.9 g/dL — ABNORMAL LOW (ref 6.0–8.5)

## 2019-08-11 LAB — CBC WITH DIFFERENTIAL/PLATELET
Basophils Absolute: 0 10*3/uL (ref 0.0–0.2)
Basos: 1 %
EOS (ABSOLUTE): 0.1 10*3/uL (ref 0.0–0.4)
Eos: 2 %
Hematocrit: 34.8 % (ref 34.0–46.6)
Hemoglobin: 11.6 g/dL (ref 11.1–15.9)
Immature Grans (Abs): 0 10*3/uL (ref 0.0–0.1)
Immature Granulocytes: 0 %
Lymphocytes Absolute: 2.4 10*3/uL (ref 0.7–3.1)
Lymphs: 37 %
MCH: 27.6 pg (ref 26.6–33.0)
MCHC: 33.3 g/dL (ref 31.5–35.7)
MCV: 83 fL (ref 79–97)
Monocytes Absolute: 0.4 10*3/uL (ref 0.1–0.9)
Monocytes: 7 %
Neutrophils Absolute: 3.5 10*3/uL (ref 1.4–7.0)
Neutrophils: 53 %
Platelets: 232 10*3/uL (ref 150–450)
RBC: 4.2 x10E6/uL (ref 3.77–5.28)
RDW: 13.6 % (ref 11.7–15.4)
WBC: 6.5 10*3/uL (ref 3.4–10.8)

## 2019-08-11 LAB — LIPID PANEL W/O CHOL/HDL RATIO
Cholesterol, Total: 154 mg/dL (ref 100–199)
HDL: 45 mg/dL (ref >39)
LDL Chol Calc (NIH): 87 mg/dL (ref 0–99)
Triglycerides: 120 mg/dL (ref 0–149)
VLDL Cholesterol Cal: 22 mg/dL (ref 5–40)

## 2019-08-11 LAB — TSH: TSH: 2.15 u[IU]/mL (ref 0.450–4.500)

## 2019-08-11 NOTE — Assessment & Plan Note (Signed)
Will repeat UA. Await results.

## 2019-08-11 NOTE — Assessment & Plan Note (Signed)
Continues to be borderline. Will start saxenda to work on weight loss- recheck 1 month.

## 2019-08-11 NOTE — Assessment & Plan Note (Signed)
Needs to lose 50lbs to have her knee replacement. Rx for saxenda given today. Discussed the importance of diet and exercise. Goal of losing about 1-2lbs per week. Recheck 1 month.

## 2019-08-11 NOTE — Telephone Encounter (Signed)
Prior Authorization initiated via CoverMyMeds for Saxenda Key: Latoya Lutz

## 2019-08-11 NOTE — Assessment & Plan Note (Signed)
Under good control on current regimen. Continue current regimen. Continue to monitor. Call with any concerns. Refills given. Labs drawn today.   

## 2019-08-12 ENCOUNTER — Encounter: Payer: Self-pay | Admitting: Family Medicine

## 2019-08-13 NOTE — Telephone Encounter (Signed)
PA Approved

## 2019-08-17 ENCOUNTER — Encounter: Payer: Self-pay | Admitting: Family Medicine

## 2019-08-25 ENCOUNTER — Ambulatory Visit (INDEPENDENT_AMBULATORY_CARE_PROVIDER_SITE_OTHER): Payer: 59 | Admitting: Urology

## 2019-08-25 ENCOUNTER — Other Ambulatory Visit: Payer: Self-pay

## 2019-08-25 ENCOUNTER — Encounter: Payer: Self-pay | Admitting: Urology

## 2019-08-25 VITALS — BP 159/80 | HR 74 | Ht 63.0 in | Wt 281.0 lb

## 2019-08-25 DIAGNOSIS — N3592 Unspecified urethral stricture, female: Secondary | ICD-10-CM

## 2019-08-25 NOTE — Progress Notes (Signed)
Female urethral dilation  Patient is placed in stirrups and her urethral meatus and vulva are cleansed with Betadine.  2% Lidocaine jelly was inserted into her urethra.  I then dilated her with Elby Showers sounds to a 26 f without difficultly.  She tolerated the procedure well.  She will return in 6 months.  During the urethral dilation, it is noted that she has two atypical looking moles on her inner external labia.  She has a history of melanoma and will mention this to her PCP.

## 2019-08-26 LAB — URINALYSIS, COMPLETE
Bilirubin, UA: NEGATIVE
Glucose, UA: NEGATIVE
Ketones, UA: NEGATIVE
Leukocytes,UA: NEGATIVE
Nitrite, UA: NEGATIVE
Protein,UA: NEGATIVE
RBC, UA: NEGATIVE
Specific Gravity, UA: 1.025 (ref 1.005–1.030)
Urobilinogen, Ur: 0.2 mg/dL (ref 0.2–1.0)
pH, UA: 5 (ref 5.0–7.5)

## 2019-08-26 LAB — MICROSCOPIC EXAMINATION
Bacteria, UA: NONE SEEN
RBC, Urine: NONE SEEN /hpf (ref 0–2)

## 2019-09-10 ENCOUNTER — Other Ambulatory Visit: Payer: Self-pay

## 2019-09-10 ENCOUNTER — Telehealth: Payer: Self-pay | Admitting: Family Medicine

## 2019-09-10 ENCOUNTER — Ambulatory Visit (INDEPENDENT_AMBULATORY_CARE_PROVIDER_SITE_OTHER): Payer: 59 | Admitting: Family Medicine

## 2019-09-10 ENCOUNTER — Encounter: Payer: Self-pay | Admitting: Family Medicine

## 2019-09-10 DIAGNOSIS — K921 Melena: Secondary | ICD-10-CM

## 2019-09-10 DIAGNOSIS — D489 Neoplasm of uncertain behavior, unspecified: Secondary | ICD-10-CM

## 2019-09-10 DIAGNOSIS — G2581 Restless legs syndrome: Secondary | ICD-10-CM

## 2019-09-10 DIAGNOSIS — L237 Allergic contact dermatitis due to plants, except food: Secondary | ICD-10-CM | POA: Diagnosis not present

## 2019-09-10 DIAGNOSIS — I10 Essential (primary) hypertension: Secondary | ICD-10-CM | POA: Diagnosis not present

## 2019-09-10 MED ORDER — HYDROCHLOROTHIAZIDE 25 MG PO TABS: 25.0000 mg | ORAL_TABLET | Freq: Every day | ORAL | 3 refills | Status: DC

## 2019-09-10 MED ORDER — CYCLOBENZAPRINE HCL 10 MG PO TABS: 10.0000 mg | ORAL_TABLET | Freq: Every day | ORAL | 2 refills | Status: DC

## 2019-09-10 MED ORDER — SUCRALFATE 1 G PO TABS: 1.0000 g | ORAL_TABLET | Freq: Three times a day (TID) | ORAL | 1 refills | Status: DC

## 2019-09-10 MED ORDER — SUCRALFATE 1 GM/10ML PO SUSP: 1.0000 g | Freq: Three times a day (TID) | ORAL | 1 refills | Status: DC

## 2019-09-10 MED ORDER — TRIAMCINOLONE ACETONIDE 0.5 % EX OINT: 1.0000 "application " | TOPICAL_OINTMENT | Freq: Two times a day (BID) | CUTANEOUS | 0 refills | Status: DC

## 2019-09-10 NOTE — Telephone Encounter (Signed)
Pharmacy called stating that the medication, sucralfate, is too expensive for pt due to insurance. They are requesting instead to have a prescription for the same medication in tablet form and she can dissolve them in water. Please advise.      [place], [address] [place] [address]  Phone: [phone] Fax: [phone]  Not a 24 hour pharmacy; exact hours not known.

## 2019-09-10 NOTE — Patient Instructions (Addendum)
Tonic Water before bed Pick up OTC magnesium supplement (not magnesium citrate)

## 2019-09-10 NOTE — Progress Notes (Signed)
BP (!) 159/76 (BP Location: Left Arm, Patient Position: Sitting, Cuff Size: Normal)   Pulse 66   Temp (!) 97.5 F (36.4 C) (Oral)   Wt 294 lb 6.4 oz (133.5 kg)   SpO2 96%   BMI 52.15 kg/m    Subjective:    Patient ID: Latoya Lutz, female    DOB: 1971-01-03, 49 y.o.   MRN: [id number]  HPI: Latoya Lutz is a 49 y.o. female  Chief Complaint  Patient presents with  . Obesity  . rls   Latoya Lutz was woken up with leg cramps 3x in the last week- Drinking about 2 big glasses of water a day. It happens in her thighs and will wake her up. Very painful.   Would like to see dermatology.   HYPERTENSION Hypertension status: uncontrolled  Satisfied with current treatment? no Duration of hypertension: newly diagnosed BP monitoring frequency:  not checking Aspirin: no Recurrent headaches: no Visual changes: no Palpitations: no Dyspnea: no Chest pain: no Lower extremity edema: no Dizzy/lightheaded: no  ABDOMINAL ISSUES Duration: on and off for 2 years Nature: aching Location: diffuse  Severity: moderate  Radiation: no Frequency: intermittent Treatments attempted: protonix Constipation: no Diarrhea: yes Episodes of diarrhea/day: multiple times a day Mucous in the stool: no Heartburn: yes Bloating:no Flatulence: no Nausea: no Vomiting: no Episodes of vomit/day: Melena or hematochezia: yes Rash: no Jaundice: no Fever: no Weight loss: no   Relevant past medical, surgical, family and social history reviewed and updated as indicated. Interim medical history since our last visit reviewed. Allergies and medications reviewed and updated.  Review of Systems  Constitutional: Negative.   HENT: Negative.   Respiratory: Negative.   Cardiovascular: Positive for leg swelling. Negative for chest pain and palpitations.  Gastrointestinal: Negative.   Musculoskeletal: Positive for arthralgias and myalgias. Negative for back pain, gait problem, joint swelling, neck  pain and neck stiffness.  Skin: Negative.   Neurological: Negative.   Hematological: Negative.   Psychiatric/Behavioral: Negative.     Per HPI unless specifically indicated above     Objective:    BP (!) 159/76 (BP Location: Left Arm, Patient Position: Sitting, Cuff Size: Normal)   Pulse 66   Temp (!) 97.5 F (36.4 C) (Oral)   Wt 294 lb 6.4 oz (133.5 kg)   SpO2 96%   BMI 52.15 kg/m   Wt Readings from Last 3 Encounters:  09/10/19 294 lb 6.4 oz (133.5 kg)  08/25/19 281 lb (127.5 kg)  08/10/19 294 lb 9.6 oz (133.6 kg)    Physical Exam Vitals and nursing note reviewed.  Constitutional:      General: She is not in acute distress.    Appearance: Normal appearance. She is not ill-appearing, toxic-appearing or diaphoretic.  HENT:     Head: Normocephalic and atraumatic.     Right Ear: External ear normal.     Left Ear: External ear normal.     Nose: Nose normal.     Mouth/Throat:     Mouth: Mucous membranes are moist.     Pharynx: Oropharynx is clear.  Eyes:     General: No scleral icterus.       Right eye: No discharge.        Left eye: No discharge.     Extraocular Movements: Extraocular movements intact.     Conjunctiva/sclera: Conjunctivae normal.     Pupils: Pupils are equal, round, and reactive to light.  Cardiovascular:     Rate and Rhythm: Normal rate and regular  rhythm.     Pulses: Normal pulses.     Heart sounds: Normal heart sounds. No murmur. No friction rub. No gallop.   Pulmonary:     Effort: Pulmonary effort is normal. No respiratory distress.     Breath sounds: Normal breath sounds. No stridor. No wheezing, rhonchi or rales.  Chest:     Chest wall: No tenderness.  Musculoskeletal:        General: Normal range of motion.     Cervical back: Normal range of motion and neck supple.     Right lower leg: No edema.     Left lower leg: Edema present.  Skin:    General: Skin is warm and dry.     Capillary Refill: Capillary refill takes less than 2 seconds.       Coloration: Skin is not jaundiced or pale.     Findings: Rash (on R arm) present. No bruising, erythema or lesion.  Neurological:     General: No focal deficit present.     Mental Status: She is alert and oriented to person, place, and time. Mental status is at baseline.  Psychiatric:        Mood and Affect: Mood normal.        Behavior: Behavior normal.        Thought Content: Thought content normal.        Judgment: Judgment normal.     Results for orders placed or performed in visit on 09/10/19  CBC with Differential/Platelet  Result Value Ref Range   WBC 6.5 3.4 - 10.8 x10E3/uL   RBC 4.43 3.77 - 5.28 x10E6/uL   Hemoglobin 12.0 11.1 - 15.9 g/dL   Hematocrit 37.7 34.0 - 46.6 %   MCV 85 79 - 97 fL   MCH 27.1 26.6 - 33.0 pg   MCHC 31.8 31.5 - 35.7 g/dL   RDW 13.1 11.7 - 15.4 %   Platelets 238 150 - 450 x10E3/uL   Neutrophils 53 Not Estab. %   Lymphs 38 Not Estab. %   Monocytes 6 Not Estab. %   Eos 2 Not Estab. %   Basos 1 Not Estab. %   Neutrophils Absolute 3.5 1.4 - 7.0 x10E3/uL   Lymphocytes Absolute 2.5 0.7 - 3.1 x10E3/uL   Monocytes Absolute 0.4 0.1 - 0.9 x10E3/uL   EOS (ABSOLUTE) 0.1 0.0 - 0.4 x10E3/uL   Basophils Absolute 0.0 0.0 - 0.2 x10E3/uL   Immature Granulocytes 0 Not Estab. %   Immature Grans (Abs) 0.0 0.0 - 0.1 x10E3/uL  Iron and TIBC  Result Value Ref Range   Total Iron Binding Capacity 277 250 - 450 ug/dL   UIBC 239 131 - 425 ug/dL   Iron 38 27 - 159 ug/dL   Iron Saturation 14 (L) 15 - 55 %  Ferritin  Result Value Ref Range   Ferritin 119 15 - 150 ng/mL  H. pylori antibody, IgA([facility])  Result Value Ref Range   H. pylori, IgA Abs <9.0 0.0 - 8.9 units  Magnesium  Result Value Ref Range   Magnesium 2.0 1.6 - 2.3 mg/dL  Phosphorus  Result Value Ref Range   Phosphorus 3.3 3.0 - 4.3 mg/dL      Assessment & Plan:   Problem List Items Addressed This Visit      Cardiovascular and Mediastinum   Essential hypertension    Will start HCTZ.  Recheck 1 month. Call with any concerns.       Relevant Medications   hydrochlorothiazide (HYDRODIURIL) 25  MG tablet     Other   Morbid obesity ([place]) - Primary    Was not able to get saxenda due to cost. Will start it when she meets her deductible.        Other Visit Diagnoses    RLS (restless legs syndrome)       Checking labs today. Await results.    Relevant Orders   Magnesium (Completed)   Phosphorus (Completed)   Neoplasm of uncertain behavior       Referral to dermatology made today.   Relevant Orders   Ambulatory referral to Dermatology   Melena       Will check labs and stool studies. If + will get her into GI. Await results.    Relevant Orders   CBC with Differential/Platelet (Completed)   Iron and TIBC (Completed)   Ferritin (Completed)   H. pylori antibody, IgA([facility]) (Completed)   Fecal occult blood, imunochemical([facility])   Allergic contact dermatitis due to plants, except food       Will treat with with triamcinalone. If it spreads, will call and we will call in prednisone.        Follow up plan: Return in about 4 weeks (around 10/08/2019).

## 2019-09-12 LAB — CBC WITH DIFFERENTIAL/PLATELET
Basophils Absolute: 0 10*3/uL (ref 0.0–0.2)
Basos: 1 %
EOS (ABSOLUTE): 0.1 10*3/uL (ref 0.0–0.4)
Eos: 2 %
Hematocrit: 37.7 % (ref 34.0–46.6)
Hemoglobin: 12 g/dL (ref 11.1–15.9)
Immature Grans (Abs): 0 10*3/uL (ref 0.0–0.1)
Immature Granulocytes: 0 %
Lymphocytes Absolute: 2.5 10*3/uL (ref 0.7–3.1)
Lymphs: 38 %
MCH: 27.1 pg (ref 26.6–33.0)
MCHC: 31.8 g/dL (ref 31.5–35.7)
MCV: 85 fL (ref 79–97)
Monocytes Absolute: 0.4 10*3/uL (ref 0.1–0.9)
Monocytes: 6 %
Neutrophils Absolute: 3.5 10*3/uL (ref 1.4–7.0)
Neutrophils: 53 %
Platelets: 238 10*3/uL (ref 150–450)
RBC: 4.43 x10E6/uL (ref 3.77–5.28)
RDW: 13.1 % (ref 11.7–15.4)
WBC: 6.5 10*3/uL (ref 3.4–10.8)

## 2019-09-12 LAB — IRON AND TIBC
Iron Saturation: 14 % — ABNORMAL LOW (ref 15–55)
Iron: 38 ug/dL (ref 27–159)
Total Iron Binding Capacity: 277 ug/dL (ref 250–450)
UIBC: 239 ug/dL (ref 131–425)

## 2019-09-12 LAB — FERRITIN: Ferritin: 119 ng/mL (ref 15–150)

## 2019-09-12 LAB — MAGNESIUM: Magnesium: 2 mg/dL (ref 1.6–2.3)

## 2019-09-12 LAB — PHOSPHORUS: Phosphorus: 3.3 mg/dL (ref 3.0–4.3)

## 2019-09-12 LAB — H. PYLORI ANTIBODY, IGA: H. pylori, IgA Abs: <9 units (ref 0.0–8.9)

## 2019-09-13 ENCOUNTER — Encounter: Payer: Self-pay | Admitting: Family Medicine

## 2019-09-13 DIAGNOSIS — I10 Essential (primary) hypertension: Secondary | ICD-10-CM | POA: Insufficient documentation

## 2019-09-13 NOTE — Assessment & Plan Note (Signed)
Was not able to get saxenda due to cost. Will start it when she meets her deductible.

## 2019-09-13 NOTE — Assessment & Plan Note (Signed)
Will start HCTZ. Recheck 1 month. Call with any concerns.  

## 2019-09-22 ENCOUNTER — Other Ambulatory Visit: Payer: 59

## 2019-09-22 ENCOUNTER — Other Ambulatory Visit: Payer: Self-pay

## 2019-09-22 DIAGNOSIS — K921 Melena: Secondary | ICD-10-CM | POA: Diagnosis not present

## 2019-09-24 LAB — FECAL OCCULT BLOOD, IMMUNOCHEMICAL: Fecal Occult Bld: NEGATIVE

## 2019-09-30 ENCOUNTER — Encounter: Payer: Self-pay | Admitting: Family Medicine

## 2019-10-15 ENCOUNTER — Ambulatory Visit (INDEPENDENT_AMBULATORY_CARE_PROVIDER_SITE_OTHER): Payer: 59 | Admitting: Family Medicine

## 2019-10-15 ENCOUNTER — Encounter: Payer: Self-pay | Admitting: Family Medicine

## 2019-10-15 ENCOUNTER — Other Ambulatory Visit: Payer: Self-pay | Admitting: Family Medicine

## 2019-10-15 ENCOUNTER — Other Ambulatory Visit: Payer: Self-pay

## 2019-10-15 VITALS — BP 135/81 | HR 86 | Temp 97.2°F | Ht 63.39 in | Wt 293.4 lb

## 2019-10-15 DIAGNOSIS — Z1211 Encounter for screening for malignant neoplasm of colon: Secondary | ICD-10-CM | POA: Diagnosis not present

## 2019-10-15 DIAGNOSIS — I10 Essential (primary) hypertension: Secondary | ICD-10-CM | POA: Diagnosis not present

## 2019-10-15 MED ORDER — HYDROCHLOROTHIAZIDE 25 MG PO TABS: 25.0000 mg | ORAL_TABLET | Freq: Every day | ORAL | 1 refills | Status: DC

## 2019-10-15 NOTE — Progress Notes (Signed)
BP 135/81 (BP Location: Left Arm, Patient Position: Sitting, Cuff Size: Normal)   Pulse 86   Temp (!) 97.2 F (36.2 C) (Oral)   Ht 5' 3.39" (1.61 m)   Wt 293 lb 6.4 oz (133.1 kg)   SpO2 98%   BMI 51.34 kg/m    Subjective:    Patient ID: Latoya Lutz, female    DOB: 1970-07-12, 49 y.o.   MRN: [id number]  HPI: Latoya Lutz is a 49 y.o. female  Chief Complaint  Patient presents with  . Hypertension  . cologuard    Placing order for cologuard.   HYPERTENSION Hypertension status: controlled  Satisfied with current treatment? yes Duration of hypertension: chronic BP monitoring frequency:  not checking BP medication side effects:  no Medication compliance: excellent compliance Previous BP meds: hctz Aspirin: no Recurrent headaches: no Visual changes: no Palpitations: no Dyspnea: no Chest pain: no Lower extremity edema: no Dizzy/lightheaded: no  Relevant past medical, surgical, family and social history reviewed and updated as indicated. Interim medical history since our last visit reviewed. Allergies and medications reviewed and updated.  Review of Systems  Constitutional: Negative.   Respiratory: Negative.   Cardiovascular: Negative.   Musculoskeletal: Positive for myalgias. Negative for arthralgias, back pain, gait problem, joint swelling, neck pain and neck stiffness.  Psychiatric/Behavioral: Negative.     Per HPI unless specifically indicated above     Objective:    BP 135/81 (BP Location: Left Arm, Patient Position: Sitting, Cuff Size: Normal)   Pulse 86   Temp (!) 97.2 F (36.2 C) (Oral)   Ht 5' 3.39" (1.61 m)   Wt 293 lb 6.4 oz (133.1 kg)   SpO2 98%   BMI 51.34 kg/m   Wt Readings from Last 3 Encounters:  10/15/19 293 lb 6.4 oz (133.1 kg)  09/10/19 294 lb 6.4 oz (133.5 kg)  08/25/19 281 lb (127.5 kg)    Physical Exam Vitals and nursing note reviewed.  Constitutional:      General: She is not in acute distress.    Appearance:  Normal appearance. She is not ill-appearing, toxic-appearing or diaphoretic.  HENT:     Head: Normocephalic and atraumatic.     Right Ear: External ear normal.     Left Ear: External ear normal.     Nose: Nose normal.     Mouth/Throat:     Mouth: Mucous membranes are moist.     Pharynx: Oropharynx is clear.  Eyes:     General: No scleral icterus.       Right eye: No discharge.        Left eye: No discharge.     Extraocular Movements: Extraocular movements intact.     Conjunctiva/sclera: Conjunctivae normal.     Pupils: Pupils are equal, round, and reactive to light.  Cardiovascular:     Rate and Rhythm: Normal rate and regular rhythm.     Pulses: Normal pulses.     Heart sounds: Normal heart sounds. No murmur. No friction rub. No gallop.   Pulmonary:     Effort: Pulmonary effort is normal. No respiratory distress.     Breath sounds: Normal breath sounds. No stridor. No wheezing, rhonchi or rales.  Chest:     Chest wall: No tenderness.  Musculoskeletal:        General: Normal range of motion.     Cervical back: Normal range of motion and neck supple.  Skin:    General: Skin is warm and dry.  Capillary Refill: Capillary refill takes less than 2 seconds.     Coloration: Skin is not jaundiced or pale.     Findings: No bruising, erythema, lesion or rash.  Neurological:     General: No focal deficit present.     Mental Status: She is alert and oriented to person, place, and time. Mental status is at baseline.  Psychiatric:        Mood and Affect: Mood normal.        Behavior: Behavior normal.        Thought Content: Thought content normal.        Judgment: Judgment normal.     Results for orders placed or performed in visit on 09/22/19  Fecal occult blood, imunochemical([facility])   Specimen: Stool   ST  Result Value Ref Range   Fecal Occult Bld Negative Negative      Assessment & Plan:   Problem List Items Addressed This Visit      Cardiovascular and  Mediastinum   Essential hypertension - Primary    Under good control on current regimen. Continue current regimen. Continue to monitor. Call with any concerns. Refills given. Labs drawn today.       Relevant Medications   hydrochlorothiazide (HYDRODIURIL) 25 MG tablet   Other Relevant Orders   Basic metabolic panel    Other Visit Diagnoses    Screening for colon cancer       Cologuard ordered. Await results.    Relevant Orders   Cologuard       Follow up plan: Return after starting saxenda, or 6 months, whichever comes first.

## 2019-10-15 NOTE — Progress Notes (Signed)
Ordering Cologuard for pt.

## 2019-10-15 NOTE — Assessment & Plan Note (Signed)
Under good control on current regimen. Continue current regimen. Continue to monitor. Call with any concerns. Refills given. Labs drawn today.   

## 2019-10-16 LAB — BASIC METABOLIC PANEL
BUN/Creatinine Ratio: 21 (ref 9–23)
BUN: 16 mg/dL (ref 6–24)
CO2: 25 mmol/L (ref 20–29)
Calcium: 9 mg/dL (ref 8.7–10.2)
Chloride: 104 mmol/L (ref 96–106)
Creatinine, Ser: 0.76 mg/dL (ref 0.57–1.00)
GFR calc Af Amer: 106 mL/min/{1.73_m2} (ref >59)
GFR calc non Af Amer: 92 mL/min/{1.73_m2} (ref >59)
Glucose: 127 mg/dL — ABNORMAL HIGH (ref 65–99)
Potassium: 3.9 mmol/L (ref 3.5–5.2)
Sodium: 142 mmol/L (ref 134–144)

## 2019-10-27 ENCOUNTER — Ambulatory Visit (INDEPENDENT_AMBULATORY_CARE_PROVIDER_SITE_OTHER): Payer: 59 | Admitting: Dermatology

## 2019-10-27 ENCOUNTER — Other Ambulatory Visit: Payer: Self-pay | Admitting: Dermatology

## 2019-10-27 ENCOUNTER — Encounter: Payer: Self-pay | Admitting: Dermatology

## 2019-10-27 ENCOUNTER — Other Ambulatory Visit: Payer: Self-pay

## 2019-10-27 DIAGNOSIS — L720 Epidermal cyst: Secondary | ICD-10-CM

## 2019-10-27 DIAGNOSIS — L409 Psoriasis, unspecified: Secondary | ICD-10-CM

## 2019-10-27 DIAGNOSIS — D485 Neoplasm of uncertain behavior of skin: Secondary | ICD-10-CM | POA: Diagnosis not present

## 2019-10-27 DIAGNOSIS — Z85828 Personal history of other malignant neoplasm of skin: Secondary | ICD-10-CM | POA: Diagnosis not present

## 2019-10-27 DIAGNOSIS — D492 Neoplasm of unspecified behavior of bone, soft tissue, and skin: Secondary | ICD-10-CM

## 2019-10-27 DIAGNOSIS — D229 Melanocytic nevi, unspecified: Secondary | ICD-10-CM

## 2019-10-27 HISTORY — DX: Melanocytic nevi, unspecified: D22.9

## 2019-10-27 MED ORDER — ENSTILAR 0.005-0.064 % EX FOAM: 1.0000 "application " | CUTANEOUS | 2 refills | Status: DC

## 2019-10-27 NOTE — Patient Instructions (Signed)

## 2019-10-27 NOTE — Progress Notes (Signed)
   Follow-Up Visit   Subjective  ZAREA SMART is a 49 y.o. female who presents for the following: Nevus (check moles on vaginal area ) and Rash (itchy areas on her chest appeared after pt had been out in the sun). Patient also has a spot behind her ear she would like checked. The patient has history of basal cell carcinoma treated in the past. She declines total-body skin exam today. Patient also has history of psoriasis would like treatment.  The following portions of the chart were reviewed this encounter and updated as appropriate:  Tobacco  Allergies  Meds  Problems  Med Hx  Surg Hx  Fam Hx      Review of Systems:  No other skin or systemic complaints except as noted in HPI or Assessment and Plan.  Objective  Well appearing patient in no apparent distress; mood and affect are within normal limits.  A focused examination was performed including face, groin, elbows, back, post auricular. Relevant physical exam findings are noted in the Assessment and Plan.  Objective  R post auricular: Subcutaneous papule/nodule with erythema and edema, tender to touch 0.4cm  Objective  Left Lower Back: Well healed scar with no evidence of recurrence.   Objective  L labia mucosa: 0.5cm brown macule   Objective  bil elbows: Well-demarcated erythematous papules/plaques with silvery scale, guttate pink scaly papules.    Assessment & Plan  Epidermal cyst R post auricular  Discussed excising.  Patient declines at this time.  We should observe and biopsy if change.  History of basal cell carcinoma (BCC) Left Lower Back  Clear. Observe for recurrence. Call clinic for new or changing lesions.  Recommend regular skin exams, daily broad-spectrum spf 30+ sunscreen use, and photoprotection.     Neoplasm of skin L labia mucosa  Epidermal / dermal shaving  Lesion length (cm):  0.5 Lesion width (cm):  0.5 Margin per side (cm):  0.2 Total excision diameter (cm):   0.9 Informed consent: discussed and consent obtained   Timeout: patient name, date of birth, surgical site, and procedure verified   Procedure prep:  Patient was prepped and draped in usual sterile fashion Prep type:  Isopropyl alcohol Anesthesia: the lesion was anesthetized in a standard fashion   Anesthetic:  1% lidocaine w/ epinephrine 1-100,000 buffered w/ 8.4% NaHCO3 Instrument used: scissors   Hemostasis achieved with: pressure, aluminum chloride and electrodesiccation   Outcome: patient tolerated procedure well   Post-procedure details: sterile dressing applied and wound care instructions given   Dressing type: bandage and petrolatum    Specimen 1 - Surgical pathology Differential Diagnosis: D48.5 Nevus vs Dysplastic nevus Check Margins: yes 0.5cm brown macule  Psoriasis bil elbows  Start Enstilar foam qd up to 5d/wk to elbow prn flares, avoid f/g/a (if Enstilar not covered can send in Taclonex solution)  Calcipotriene-Betameth Diprop (ENSTILAR) 0.005-0.064 % FOAM - bil elbows  Sunburn changes of the chest.  Commend sunscreen and frequent self skin exams.   Return for ~56m for TBSE.   I, Othelia Pulling, RMA, am acting as scribe for Sarina Ser, MD . Documentation: I have reviewed the above documentation for accuracy and completeness, and I agree with the above.  Sarina Ser, MD

## 2019-10-28 ENCOUNTER — Encounter: Payer: Self-pay | Admitting: Dermatology

## 2019-11-03 ENCOUNTER — Telehealth: Payer: Self-pay

## 2019-11-03 ENCOUNTER — Other Ambulatory Visit: Payer: Self-pay

## 2019-11-03 NOTE — Telephone Encounter (Signed)
Patient informed of pathology results. Information faxed to Zara Council PA-C at [place] for review of treatment options.

## 2019-11-03 NOTE — Telephone Encounter (Signed)
-----   Message from Ralene Bathe, MD sent at 10/29/2019  6:24 PM EDT ----- Skin , left labia mucosa ATYPICAL JUNCTIONAL LENTIGINOUS MELANOCYTIC PROLIFERATION, PERIPHERAL MARGIN INVOLVED, SEE DESCRIPTION  Severely Atypical mole Needs excision May be best done by Urology or GYN. Discuss with pt. So we can communicate with surgeons to excise. I can do it, but Urology and GYN are more familiar with doing surgery in this location.

## 2019-11-03 NOTE — Telephone Encounter (Signed)
LM on VM to return my call. 

## 2019-11-11 ENCOUNTER — Telehealth: Payer: Self-pay

## 2019-11-11 NOTE — Telephone Encounter (Signed)
Left message on clinical voicemail to return my call. Has Zara Council PA-C reviewed my fax, and if so where does she recommend the patient have surgery?

## 2019-11-16 ENCOUNTER — Telehealth: Payer: Self-pay | Admitting: Urology

## 2019-11-16 ENCOUNTER — Other Ambulatory Visit: Payer: Self-pay | Admitting: Urology

## 2019-11-16 DIAGNOSIS — D239 Other benign neoplasm of skin, unspecified: Secondary | ICD-10-CM

## 2019-11-16 NOTE — Telephone Encounter (Signed)
Does she have a particular provider or location in mind for her gynecology referral?

## 2019-11-16 NOTE — Telephone Encounter (Signed)
Would you ask if Latoya Lutz has a gynecologist?  We need to refer her to one regarding the atypical moles in her vaginal area as they cannot biopsy them at [facility].

## 2019-11-16 NOTE — Telephone Encounter (Signed)
Patient states she does not have a GYN. She is requesting a referral at [facility].

## 2019-11-16 NOTE — Progress Notes (Unsigned)
Referral placed to [facility].

## 2019-11-19 NOTE — Telephone Encounter (Signed)
I called and left another message on the clinical voicemail to return my call in regards to patient care/surgery.

## 2019-11-25 NOTE — Telephone Encounter (Signed)
I left another message on the clinical voicemail to return my call in regards to patient care/surgery.

## 2019-11-30 ENCOUNTER — Telehealth: Payer: Self-pay

## 2019-11-30 ENCOUNTER — Other Ambulatory Visit: Payer: Self-pay

## 2019-11-30 NOTE — Telephone Encounter (Signed)
Latoya Lutz states that she was the one that requested the September date due to work constraints.

## 2019-11-30 NOTE — Telephone Encounter (Signed)
Prior Authorization initiated via CoverMyMeds for Saxenda Key: [id number]  PA Approved

## 2019-11-30 NOTE — Telephone Encounter (Signed)
I sent in a referral for Latoya Lutz to gynecology for a dysplastic nevus and I see her appointment is not until September.  I would like to have her seen sooner, it that possible?

## 2019-11-30 NOTE — Telephone Encounter (Signed)
The soonest they could move it was July 30th at 10:00 I took that and I will notify [name]

## 2019-12-01 NOTE — Telephone Encounter (Signed)
Left a voicemail for patient to return my call. I haven't heard back from the urologist yet and I was wondering if she had, and if so when is that appointment?

## 2019-12-01 NOTE — Telephone Encounter (Signed)
Patient did return Ashley's call. I had to call her back and left another message to return my call.

## 2019-12-02 NOTE — Telephone Encounter (Signed)
Patient called back this morning. She has been referred to [facility] and scheduled for 02/18/20. She could have been seen sooner but she wanted to delay appointment during the summer.

## 2019-12-18 ENCOUNTER — Telehealth: Payer: Self-pay | Admitting: Obstetrics and Gynecology

## 2019-12-18 NOTE — Telephone Encounter (Signed)
Pt called in and stated that she has an appt in September, the pt wants to know if she is going to be cut or is this just a consult. I told the pt I would send a message. I am just a front desk. I told her that they have 24 to 48 hours to reply. Please advise

## 2019-12-24 NOTE — Telephone Encounter (Signed)
Patient called in stating she sent a message last week and hasn't gotten a response. Patient is planning on taking time off of work as the moles that are being removed are in sensitive areas, patient would like to know if Dr. Marcelline Mates will be removing them at her upcoming appointment or if they would be doing just a consult. Informed patient the nurse has 24-48 hours to reply so if she doesn't hear anything by 1:16 pm on Monday then to give [place] a call back. Patient was very pleasant and verbalized understanding.

## 2019-12-28 ENCOUNTER — Other Ambulatory Visit: Payer: Self-pay | Admitting: Family Medicine

## 2019-12-28 DIAGNOSIS — K219 Gastro-esophageal reflux disease without esophagitis: Secondary | ICD-10-CM

## 2019-12-28 NOTE — Telephone Encounter (Signed)
Pt called no answer LM via VM that I was not sure of what she would be having done during her visit. Due to her type of visit the moles may or may not be removed.

## 2019-12-30 ENCOUNTER — Encounter: Payer: Self-pay | Admitting: Family Medicine

## 2019-12-30 ENCOUNTER — Other Ambulatory Visit: Payer: Self-pay | Admitting: Nurse Practitioner

## 2019-12-30 ENCOUNTER — Encounter: Payer: Self-pay | Admitting: Nurse Practitioner

## 2019-12-30 ENCOUNTER — Other Ambulatory Visit: Payer: Self-pay

## 2019-12-30 ENCOUNTER — Ambulatory Visit: Payer: 59 | Admitting: Nurse Practitioner

## 2019-12-30 ENCOUNTER — Telehealth (INDEPENDENT_AMBULATORY_CARE_PROVIDER_SITE_OTHER): Payer: 59 | Admitting: Family Medicine

## 2019-12-30 DIAGNOSIS — H9202 Otalgia, left ear: Secondary | ICD-10-CM

## 2019-12-30 DIAGNOSIS — H65112 Acute and subacute allergic otitis media (mucoid) (sanguinous) (serous), left ear: Secondary | ICD-10-CM

## 2019-12-30 DIAGNOSIS — H6690 Otitis media, unspecified, unspecified ear: Secondary | ICD-10-CM | POA: Insufficient documentation

## 2019-12-30 MED ORDER — AZELASTINE HCL 0.1 % NA SOLN: 1.0000 | Freq: Two times a day (BID) | NASAL | 12 refills | Status: DC

## 2019-12-30 MED ORDER — AMOXICILLIN-POT CLAVULANATE 875-125 MG PO TABS: 1.0000 | ORAL_TABLET | Freq: Two times a day (BID) | ORAL | 0 refills | Status: AC

## 2019-12-30 MED ORDER — PREDNISONE 20 MG PO TABS
40.0000 mg | ORAL_TABLET | Freq: Every day | ORAL | 0 refills | Status: AC
Start: 2019-12-30 — End: 2020-01-04

## 2019-12-30 NOTE — Progress Notes (Signed)
Virtual appointment not appropriate due to ear pain with no other symptoms. Will reschedule for in person this afternoon

## 2019-12-30 NOTE — Patient Instructions (Signed)
Otitis Media, Adult  Otitis media means that the middle ear is red and swollen (inflamed) and full of fluid. The condition usually goes away on its own. Follow these instructions at home:  Take over-the-counter and prescription medicines only as told by your doctor.  If you were prescribed an antibiotic medicine, take it as told by your doctor. Do not stop taking the antibiotic even if you start to feel better.  Keep all follow-up visits as told by your doctor. This is important. Contact a doctor if:  You have bleeding from your nose.  There is a lump on your neck.  You are not getting better in 5 days.  You feel worse instead of better. Get help right away if:  You have pain that is not helped with medicine.  You have swelling, redness, or pain around your ear.  You get a stiff neck.  You cannot move part of your face (paralyzed).  You notice that the bone behind your ear hurts when you touch it.  You get a very bad headache. Summary  Otitis media means that the middle ear is red, swollen, and full of fluid.  This condition usually goes away on its own. In some cases, treatment may be needed.  If you were prescribed an antibiotic medicine, take it as told by your doctor. This information is not intended to replace advice given to you by your health care provider. Make sure you discuss any questions you have with your health care provider. Document Revised: 05/10/2017 Document Reviewed: 06/18/2016 Elsevier Patient Education  2020 [employer]  

## 2019-12-30 NOTE — Progress Notes (Signed)
BP 128/81   Pulse 66   Temp 98 F (36.7 C) (Oral)   Wt 294 lb 3.2 oz (133.4 kg)   SpO2 98%   BMI 51.48 kg/m    Subjective:    Patient ID: Latoya Lutz, female    DOB: 05-07-1971, 49 y.o.   MRN: [id number]  HPI: Latoya Lutz is a 49 y.o. female  Chief Complaint  Patient presents with  . Ear Pain    L ear, patient states her ear has been hurting for the past month, states she was swimming this past weekend and water got into her ear    EAR PAIN (LEFT) Reports left pain for one month.  Has pool at home, assumes she is getting water in it.  Has been putting putty in ear to help current pain.  Takes Flonase, Astelin, Sudafed, Allegra -- not helping current pain.   Duration: weeks Involved ear(s): left Severity:  7/10  Quality:  dull, aching and throbbing Fever: no Otorrhea: no Upper respiratory infection symptoms: no Pruritus: no Hearing loss: no Water immersion yes Using Q-tips: once in a blue moon Recurrent otitis media: no Status: fluctuating Treatments attempted: Flonase, Astelin, Sudafed, Allegra, Tylenol  Relevant past medical, surgical, family and social history reviewed and updated as indicated. Interim medical history since our last visit reviewed. Allergies and medications reviewed and updated.  Review of Systems  Constitutional: Negative for activity change, appetite change, diaphoresis, fatigue and fever.  HENT: Positive for ear pain. Negative for congestion, ear discharge, postnasal drip, rhinorrhea, sinus pressure and sinus pain.   Respiratory: Negative for cough, chest tightness and shortness of breath.   Cardiovascular: Negative for chest pain, palpitations and leg swelling.  Gastrointestinal: Negative.   Neurological: Negative.   Psychiatric/Behavioral: Negative.     Per HPI unless specifically indicated above     Objective:    BP 128/81   Pulse 66   Temp 98 F (36.7 C) (Oral)   Wt 294 lb 3.2 oz (133.4 kg)   SpO2 98%   BMI  51.48 kg/m   Wt Readings from Last 3 Encounters:  12/30/19 294 lb 3.2 oz (133.4 kg)  10/15/19 293 lb 6.4 oz (133.1 kg)  09/10/19 294 lb 6.4 oz (133.5 kg)    Physical Exam Vitals and nursing note reviewed.  Constitutional:      General: She is awake. She is not in acute distress.    Appearance: She is well-developed and well-groomed. She is obese. She is not ill-appearing.  HENT:     Head: Normocephalic.     Right Ear: Hearing, tympanic membrane, ear canal and external ear normal. No decreased hearing noted.     Left Ear: Hearing, ear canal and external ear normal. No decreased hearing noted. A middle ear effusion is present. Tympanic membrane is injected (mild).     Nose: Nose normal.     Mouth/Throat:     Mouth: Mucous membranes are moist.  Eyes:     General: Lids are normal.        Right eye: No discharge.        Left eye: No discharge.     Conjunctiva/sclera: Conjunctivae normal.     Pupils: Pupils are equal, round, and reactive to light.  Neck:     Thyroid: No thyromegaly.  Cardiovascular:     Rate and Rhythm: Normal rate and regular rhythm.     Heart sounds: Normal heart sounds. No murmur heard.  No gallop.   Pulmonary:  Effort: Pulmonary effort is normal. No accessory muscle usage or respiratory distress.     Breath sounds: Normal breath sounds.  Abdominal:     General: Bowel sounds are normal.     Palpations: Abdomen is soft.  Musculoskeletal:     Cervical back: Normal range of motion and neck supple.     Right lower leg: No edema.     Left lower leg: No edema.  Skin:    General: Skin is warm and dry.  Neurological:     Mental Status: She is alert and oriented to person, place, and time.  Psychiatric:        Attention and Perception: Attention normal.        Mood and Affect: Mood normal.        Speech: Speech normal.        Behavior: Behavior normal. Behavior is cooperative.        Thought Content: Thought content normal.        Judgment: Judgment normal.       Results for orders placed or performed in visit on 09/81/19  Basic metabolic panel  Result Value Ref Range   Glucose 127 (H) 65 - 99 mg/dL   BUN 16 6 - 24 mg/dL   Creatinine, Ser 0.76 0.57 - 1.00 mg/dL   GFR calc non Af Amer 92 >59 mL/min/1.73   GFR calc Af Amer 106 >59 mL/min/1.73   BUN/Creatinine Ratio 21 9 - 23   Sodium 142 134 - 144 mmol/L   Potassium 3.9 3.5 - 5.2 mmol/L   Chloride 104 96 - 106 mmol/L   CO2 25 20 - 29 mmol/L   Calcium 9.0 8.7 - 10.2 mg/dL      Assessment & Plan:   Problem List Items Addressed This Visit      Nervous and Auditory   Otitis media    Acute with symptoms x one month, recent worsening after immersion in water.  Scripts for Augmentin and Prednisone sent.  Recommend avoiding immersion in water over next couple weeks, use ear plugs in future if in water.  May take Tylenol as needed for pain.  Return to office for worsening or ongoing pain.      Relevant Medications   amoxicillin-clavulanate (AUGMENTIN) 875-125 MG tablet       Follow up plan: Return if symptoms worsen or fail to improve.

## 2019-12-30 NOTE — Assessment & Plan Note (Signed)
Acute with symptoms x one month, recent worsening after immersion in water.  Scripts for Augmentin and Prednisone sent.  Recommend avoiding immersion in water over next couple weeks, use ear plugs in future if in water.  May take Tylenol as needed for pain.  Return to office for worsening or ongoing pain.

## 2019-12-31 MED ORDER — FLUCONAZOLE 150 MG PO TABS
150.0000 mg | ORAL_TABLET | Freq: Once | ORAL | 0 refills | Status: AC
Start: 2019-12-31 — End: 2019-12-31

## 2019-12-31 NOTE — Addendum Note (Signed)
Addended by: Marnee Guarneri T on: 12/31/2019 07:41 AM   Modules accepted: Orders

## 2020-01-08 ENCOUNTER — Encounter: Payer: 59 | Admitting: Obstetrics and Gynecology

## 2020-01-15 ENCOUNTER — Telehealth: Payer: Self-pay | Admitting: Obstetrics and Gynecology

## 2020-01-15 NOTE — Telephone Encounter (Signed)
I have scheduled patient for consult for removal of two moles on the vagina. Patient does not want the moles removed at this appointment. She has scheduled another appointment to have these removed.

## 2020-01-15 NOTE — Telephone Encounter (Signed)
Pt called back in and stated that she had a missed call. I informed the pt that yes ur provider will be out of the office on 9/9 and that her appt was moved. The pt stated that she didn't want to wait that long and requested to be seen by another provider. The pt is requesting a call back she wants to know what will happen at this appt. I told her I will send a message and to please allow 24 to 48 hours for a reply. The pt verbally understood

## 2020-01-19 ENCOUNTER — Ambulatory Visit (INDEPENDENT_AMBULATORY_CARE_PROVIDER_SITE_OTHER): Payer: 59 | Admitting: Obstetrics and Gynecology

## 2020-01-19 ENCOUNTER — Encounter: Payer: Self-pay | Admitting: Obstetrics and Gynecology

## 2020-01-19 VITALS — BP 132/70 | HR 73 | Ht 63.0 in | Wt 295.0 lb

## 2020-01-19 DIAGNOSIS — D280 Benign neoplasm of vulva: Secondary | ICD-10-CM | POA: Diagnosis not present

## 2020-01-19 NOTE — Progress Notes (Addendum)
HPI:      [name] is a 49 y.o. No obstetric history on file. who LMP was No LMP recorded. Patient has had a hysterectomy.  Subjective:   She presents today because of a vulvar lesion that has previously been excised.  It was incompletely excised according to the margins.  Please see pathology report. Patient sees urology every 6 months for urethral dilation and at the time of her last visit to new appearing dark lesions appeared on her labia.  She was referred to her family physician who then referred her to a dermatologist.  A biopsy was performed on one of the lesions.  According to the patient, the other lesion was not significant and did not require biopsy. The lesion that was biopsied is now "completely gone" according to the patient.  She does not want any further biopsy or excision performed unless it is "necessary".    Hx: The following portions of the patient's history were reviewed and updated as appropriate:             She  has a past medical history of Acid reflux, Actinic keratosis, Arthritis, Candidal dermatitis (05/25/2014), Cervical spine disease, Chicken pox, Chronic urethral narrowing, Complication of anesthesia, Endometriosis, Esophageal reflux (05/25/2014), Gross hematuria (04/25/2015), H/O cystoscopy, Heart murmur, basal cell carcinoma (12/29/2008), Infection of urinary tract (09/11/2014), Kidney stone (09/11/2014), Kidney stones, Microscopic hematuria (09/11/2014), Obesity, morbid, BMI 40.0-49.9 ([place]) (04/10/2012), PONV (postoperative nausea and vomiting), Right ureteral stone (04/28/2015), Skin cancer, and Urinary retention. She does not have any pertinent problems on file. She  has a past surgical history that includes Cholecystectomy; Bladder surgery (07/2003); Cesarean section (1998); Abdominal hysterectomy; Knee surgery (2005); Foot surgery (2003); Carpal tunnel release (Right); Cystoscopy with stent placement (Right, 04/29/2015); Ureteroscopy with holmium laser  lithotripsy (Right, 05/16/2015); Cystoscopy w/ ureteral stent placement (Right, 05/16/2015); Cystoscopy w/ retrogrades (Right, 05/16/2015); Extracorporeal shock wave lithotripsy (Left, 06/02/2015); Tonsillectomy; and Tonsillectomy. Her family history includes COPD in her father; Cancer in her father, maternal grandmother, and paternal grandmother; Cirrhosis in her paternal grandmother; Diabetes in her paternal grandmother; Heart disease in her mother; Hyperlipidemia in her mother; Hypertension in her mother. She  reports that she quit smoking about 11 years ago. Her smoking use included cigarettes. She has a 8.50 pack-year smoking history. She has never used smokeless tobacco. She reports that she does not drink alcohol and does not use drugs. She has a current medication list which includes the following prescription(s): acyclovir, azelastine, buspirone, enstilar, calcium carbonate, cyclobenzaprine, diclofenac sodium, fexofenadine, fluticasone, hydrochlorothiazide, meloxicam, menopause formula, pantoprazole, pravastatin, pseudoephedrine, simethicone, sucralfate, tramadol, unifine pentips, vitamin e, and saxenda. She is allergic to atorvastatin.       Review of Systems:  Review of Systems  Constitutional: Denied constitutional symptoms, night sweats, recent illness, fatigue, fever, insomnia and weight loss.  Eyes: Denied eye symptoms, eye pain, photophobia, vision change and visual disturbance.  Ears/Nose/Throat/Neck: Denied ear, nose, throat or neck symptoms, hearing loss, nasal discharge, sinus congestion and sore throat.  Cardiovascular: Denied cardiovascular symptoms, arrhythmia, chest pain/pressure, edema, exercise intolerance, orthopnea and palpitations.  Respiratory: Denied pulmonary symptoms, asthma, pleuritic pain, productive sputum, cough, dyspnea and wheezing.  Gastrointestinal: Denied, gastro-esophageal reflux, melena, nausea and vomiting.  Genitourinary: See HPI for additional information.   Musculoskeletal: Denied musculoskeletal symptoms, stiffness, swelling, muscle weakness and myalgia.  Dermatologic: Denied dermatology symptoms, rash and scar.  Neurologic: Denied neurology symptoms, dizziness, headache, neck pain and syncope.  Psychiatric: Denied psychiatric symptoms, anxiety and depression.  Endocrine: Denied endocrine symptoms including hot flashes and night sweats.   Meds:   Current Outpatient Medications on File Prior to Visit  Medication Sig Dispense Refill  . acyclovir (ZOVIRAX) 400 MG tablet Take 1 tablet (400 mg total) by mouth 3 (three) times daily. 30 tablet 12  . azelastine (ASTELIN) 0.1 % nasal spray Place 1 spray into both nostrils 2 (two) times daily. 30 mL 12  . busPIRone (BUSPAR) 10 MG tablet Take 1 tablet (10 mg total) by mouth 3 (three) times daily. 90 tablet 6  . Calcipotriene-Betameth Diprop (ENSTILAR) 0.005-0.064 % FOAM Apply 1 application topically as directed. Qd up to 5 days a week aa psoriasis on elbows prn flares 60 g 2  . Calcium Carbonate (CALCIUM 600 PO) Take 1 tablet by mouth daily.     . cyclobenzaprine (FLEXERIL) 10 MG tablet Take 1 tablet (10 mg total) by mouth at bedtime. 30 tablet 2  . diclofenac Sodium (VOLTAREN) 1 % GEL     . fexofenadine (ALLEGRA) 180 MG tablet Take 180 mg by mouth daily.    . fluticasone (FLONASE) 50 MCG/ACT nasal spray Place 2 sprays into both nostrils daily. 48 g 3  . hydrochlorothiazide (HYDRODIURIL) 25 MG tablet Take 1 tablet (25 mg total) by mouth daily. 90 tablet 1  . meloxicam (MOBIC) 15 MG tablet Take 1 tablet (15 mg total) by mouth daily. 90 tablet 3  . Nutritional Supplements (MENOPAUSE FORMULA) TABS Take by mouth. Reported on 11/04/2015    . pantoprazole (PROTONIX) 40 MG tablet TAKE 1 TABLET BY MOUTH DAILY. 90 tablet 1  . pravastatin (PRAVACHOL) 20 MG tablet TAKE 1 TABLET BY MOUTH DAILY 90 tablet 4  . pseudoephedrine (SUDAFED) 30 MG tablet Take 30 mg by mouth 3 (three) times daily.    . Simethicone 180 MG  CAPS Take 1 capsule (180 mg total) by mouth 3 (three) times daily with meals. 90 capsule 6  . sucralfate (CARAFATE) 1 g tablet Take 1 tablet (1 g total) by mouth 4 (four) times daily -  with meals and at bedtime. 120 tablet 1  . traMADol (ULTRAM) 50 MG tablet     . UNIFINE PENTIPS 31G X 8 MM MISC     . vitamin E 400 UNIT capsule Take 400 Units by mouth daily.    Marland Kitchen SAXENDA 18 MG/3ML SOPN Inject 3 mg into the skin daily. (Patient not taking: Reported on 12/30/2019)     No current facility-administered medications on file prior to visit.    Objective:     Vitals:   01/19/20 1007  BP: 132/70  Pulse: 73              Patient has shown me pictures of the area in question on her phone.  Declining examination today.  Assessment:    No obstetric history on file. Patient Active Problem List   Diagnosis Date Noted  . Otitis media 12/30/2019  . Essential hypertension 09/13/2019  . Acute pain of left knee 07/10/2018  . Acute pain of right shoulder 07/10/2018  . Hyperlipidemia 10/19/2016  . Murmur 07/23/2016  . Acute anxiety 03/05/2016  . Dysphagia 11/14/2015  . 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 History of urinary stone 10/29/2015  . History of urethral narrowing 07/16/2015  . Right ureteral stone 04/28/2015  . Gross hematuria 04/25/2015  . Urethral stricture 09/11/2014  . Urinary retention 09/11/2014  . Microscopic hematuria 09/11/2014  . Kidney stone 09/11/2014  . Incomplete emptying of bladder 09/11/2014  . Disease caused  by fungus 09/11/2014  . Esophageal reflux 05/25/2014  . Candidal dermatitis 05/25/2014  . Morbid obesity ([place]) 04/10/2012     1. Atypical nevus of labia majora     Clinical significance now that the lesion is " gone" is unknown.   Following literature was reviewed:  Precursors to melanoma and their mimics: nevi of special sites - Virl Diamond Elder     Plan:            1.  Consideration of expectant management and if any evidence of return then excisional  biopsi  2.  We will discuss this with Dr. Nehemiah Massed and make sure this is an adequate plan  (awaiting his return phone call)  I was able to speak with Dr. Nehemiah Massed today and discuss his biopsy findings.  We discussed the possibility of expectant management for this type of lesion and while he believes it is low risk for melanoma and may in fact be completely excised because of its previous size he thinks the best course of action would likely be to perform a wider excisional biopsy just to be sure.  I discussed with him the possibility of expectant management because the lesion is now gone.  The possibility of waiting to see a change in color or texture of the skin in this area before performing the excision was floated as a possibility but again he feels like complete excision would be best.  He was not able to confirm whether or not the other lesion that was present is of similar nature to this 1 and would thus consider excision of this lesion at the time of excision of the first. (I hope that I have characterized our conversation well.)  3.  If further excision necessary we will discuss with the patient.  I will have her follow-up and discussed possible excision.  Office vs outpt surgery.        Orders No orders of the defined types were placed in this encounter.   No orders of the defined types were placed in this encounter.     F/U  No follow-ups on file. I spent 31 minutes involved in the care of this patient preparing to see the patient by obtaining and reviewing her medical history (including labs, imaging tests and prior procedures), documenting clinical information in the electronic health record (EHR), counseling and coordinating care plans, writing and sending prescriptions, ordering tests or procedures and directly communicating with the patient by discussing pertinent items from her history and physical exam as well as detailing my assessment and plan as noted above so that she has an  informed understanding.  All of her questions were answered.  Finis Bud, M.D. 01/19/2020 11:38 AM

## 2020-02-16 ENCOUNTER — Telehealth: Payer: Self-pay

## 2020-02-16 NOTE — Telephone Encounter (Signed)
LMTRC

## 2020-02-16 NOTE — Progress Notes (Signed)
Female urethral dilation  Patient is placed in stirrups and her urethral meatus and vulva are cleansed with Betadine.  2% Lidocaine jelly was inserted into her urethra.  I then dilated her with Elby Showers sounds to a 26 f without difficultly.  She tolerated the procedure well.  She will return in 6 months.  In regards to the atypical labial moles, she has been seen by dermatology and is scheduled for further removal of the lesion with gynecology.

## 2020-02-17 ENCOUNTER — Ambulatory Visit (INDEPENDENT_AMBULATORY_CARE_PROVIDER_SITE_OTHER): Payer: 59 | Admitting: Urology

## 2020-02-17 ENCOUNTER — Other Ambulatory Visit: Payer: Self-pay

## 2020-02-17 ENCOUNTER — Encounter: Payer: Self-pay | Admitting: Urology

## 2020-02-17 VITALS — BP 165/84 | HR 70 | Ht 66.0 in | Wt 296.0 lb

## 2020-02-17 DIAGNOSIS — N3592 Unspecified urethral stricture, female: Secondary | ICD-10-CM

## 2020-02-17 NOTE — Telephone Encounter (Signed)
Informed pt that DJE would prefer to do her lesion removal in OR. He states that it may be more invasive than he originally thought. He states this may be better if the pt is asleep.   Pt was very frustrated as she has taken time off work and feels like she has been going back and forth for the last several months. She wants to get this over with.   Discussed with DJE and he states if the pt wants it removed in office he will do it.   Pt called back and decided that it may be best to remove it in the OR. I advised her to keep her appt for tomorrow to discuss the procedure and determine which day is best for her. Pt voices understanding.

## 2020-02-18 ENCOUNTER — Encounter: Payer: 59 | Admitting: Obstetrics and Gynecology

## 2020-02-18 ENCOUNTER — Ambulatory Visit (INDEPENDENT_AMBULATORY_CARE_PROVIDER_SITE_OTHER): Payer: 59 | Admitting: Obstetrics and Gynecology

## 2020-02-18 ENCOUNTER — Encounter: Payer: Self-pay | Admitting: Obstetrics and Gynecology

## 2020-02-18 VITALS — BP 125/77 | HR 76 | Ht 66.0 in | Wt 294.3 lb

## 2020-02-18 DIAGNOSIS — D280 Benign neoplasm of vulva: Secondary | ICD-10-CM | POA: Diagnosis not present

## 2020-02-18 NOTE — Progress Notes (Signed)
HPI:      [name] is a 49 y.o. No obstetric history on file. who LMP was No LMP recorded. Patient has had a hysterectomy.  Subjective:   She presents today to discuss possible excision of nevus of labia.  I previously discussed expectant management with the patient and ran this by Dr. Nehemiah Massed (dermatology) and his recommendation was because it has the possibility of being premalignant a more complete excision should be performed.  He did not want to specifically operate on the labia because is not his area of expertise. Patient also has a another pigmented lesion on the opposite labia but is very lightly pigmented flat and not irregular. She presents today with her husband to further discuss management.  She does report that the lesion in question is completely gone and no evidence remains of pigmentation in that area.  Despite Dr. Alvester Morin recommendations she remains unsure of whether or not to proceed with more extensive surgery to increase the margins of this area.    Hx: The following portions of the patient's history were reviewed and updated as appropriate:             She  has a past medical history of Acid reflux, Actinic keratosis, Arthritis, Candidal dermatitis (05/25/2014), Cervical spine disease, Chicken pox, Chronic urethral narrowing, Complication of anesthesia, Endometriosis, Esophageal reflux (05/25/2014), Gross hematuria (04/25/2015), H/O cystoscopy, Heart murmur, basal cell carcinoma (12/29/2008), Infection of urinary tract (09/11/2014), Kidney stone (09/11/2014), Kidney stones, Microscopic hematuria (09/11/2014), Obesity, morbid, BMI 40.0-49.9 ([place]) (04/10/2012), PONV (postoperative nausea and vomiting), Right ureteral stone (04/28/2015), Skin cancer, and Urinary retention. She does not have any pertinent problems on file. She  has a past surgical history that includes Cholecystectomy; Bladder surgery (07/2003); Cesarean section (1998); Abdominal hysterectomy; Knee surgery  (2005); Foot surgery (2003); Carpal tunnel release (Right); Cystoscopy with stent placement (Right, 04/29/2015); Ureteroscopy with holmium laser lithotripsy (Right, 05/16/2015); Cystoscopy w/ ureteral stent placement (Right, 05/16/2015); Cystoscopy w/ retrogrades (Right, 05/16/2015); Extracorporeal shock wave lithotripsy (Left, 06/02/2015); Tonsillectomy; and Tonsillectomy. Her family history includes COPD in her father; Cancer in her father, maternal grandmother, and paternal grandmother; Cirrhosis in her paternal grandmother; Diabetes in her paternal grandmother; Heart disease in her mother; Hyperlipidemia in her mother; Hypertension in her mother. She  reports that she quit smoking about 11 years ago. Her smoking use included cigarettes. She has a 8.50 pack-year smoking history. She has never used smokeless tobacco. She reports that she does not drink alcohol and does not use drugs. She has a current medication list which includes the following prescription(s): acyclovir, azelastine, buspirone, enstilar, calcium carbonate, cyclobenzaprine, diclofenac sodium, fexofenadine, fluticasone, hydrochlorothiazide, meloxicam, menopause formula, pantoprazole, pravastatin, pseudoephedrine, simethicone, sucralfate, tramadol, unifine pentips, vitamin e, and saxenda. She is allergic to atorvastatin.       Review of Systems:  Review of Systems  Constitutional: Denied constitutional symptoms, night sweats, recent illness, fatigue, fever, insomnia and weight loss.  Eyes: Denied eye symptoms, eye pain, photophobia, vision change and visual disturbance.  Ears/Nose/Throat/Neck: Denied ear, nose, throat or neck symptoms, hearing loss, nasal discharge, sinus congestion and sore throat.  Cardiovascular: Denied cardiovascular symptoms, arrhythmia, chest pain/pressure, edema, exercise intolerance, orthopnea and palpitations.  Respiratory: Denied pulmonary symptoms, asthma, pleuritic pain, productive sputum, cough, dyspnea and  wheezing.  Gastrointestinal: Denied, gastro-esophageal reflux, melena, nausea and vomiting.  Genitourinary: See HPI for additional information.  Musculoskeletal: Denied musculoskeletal symptoms, stiffness, swelling, muscle weakness and myalgia.  Dermatologic: Denied dermatology symptoms, rash and scar.  Neurologic:  Denied neurology symptoms, dizziness, headache, neck pain and syncope.  Psychiatric: Denied psychiatric symptoms, anxiety and depression.  Endocrine: Denied endocrine symptoms including hot flashes and night sweats.   Meds:   Current Outpatient Medications on File Prior to Visit  Medication Sig Dispense Refill  . acyclovir (ZOVIRAX) 400 MG tablet Take 1 tablet (400 mg total) by mouth 3 (three) times daily. 30 tablet 12  . azelastine (ASTELIN) 0.1 % nasal spray Place 1 spray into both nostrils 2 (two) times daily. 30 mL 12  . busPIRone (BUSPAR) 10 MG tablet Take 1 tablet (10 mg total) by mouth 3 (three) times daily. 90 tablet 6  . Calcipotriene-Betameth Diprop (ENSTILAR) 0.005-0.064 % FOAM Apply 1 application topically as directed. Qd up to 5 days a week aa psoriasis on elbows prn flares 60 g 2  . Calcium Carbonate (CALCIUM 600 PO) Take 1 tablet by mouth daily.     . cyclobenzaprine (FLEXERIL) 10 MG tablet Take 1 tablet (10 mg total) by mouth at bedtime. 30 tablet 2  . diclofenac Sodium (VOLTAREN) 1 % GEL     . fexofenadine (ALLEGRA) 180 MG tablet Take 180 mg by mouth daily.    . fluticasone (FLONASE) 50 MCG/ACT nasal spray Place 2 sprays into both nostrils daily. 48 g 3  . hydrochlorothiazide (HYDRODIURIL) 25 MG tablet Take 1 tablet (25 mg total) by mouth daily. 90 tablet 1  . meloxicam (MOBIC) 15 MG tablet Take 1 tablet (15 mg total) by mouth daily. 90 tablet 3  . Nutritional Supplements (MENOPAUSE FORMULA) TABS Take by mouth. Reported on 11/04/2015    . pantoprazole (PROTONIX) 40 MG tablet TAKE 1 TABLET BY MOUTH DAILY. 90 tablet 1  . pravastatin (PRAVACHOL) 20 MG tablet TAKE 1  TABLET BY MOUTH DAILY 90 tablet 4  . pseudoephedrine (SUDAFED) 30 MG tablet Take 30 mg by mouth 3 (three) times daily.    . Simethicone 180 MG CAPS Take 1 capsule (180 mg total) by mouth 3 (three) times daily with meals. 90 capsule 6  . sucralfate (CARAFATE) 1 g tablet Take 1 tablet (1 g total) by mouth 4 (four) times daily -  with meals and at bedtime. 120 tablet 1  . traMADol (ULTRAM) 50 MG tablet     . UNIFINE PENTIPS 31G X 8 MM MISC     . vitamin E 400 UNIT capsule Take 400 Units by mouth daily.    Marland Kitchen SAXENDA 18 MG/3ML SOPN Inject 3 mg into the skin daily.  (Patient not taking: Reported on 02/18/2020)     No current facility-administered medications on file prior to visit.    Objective:     Vitals:   02/18/20 1452  BP: 125/77  Pulse: 76                Assessment:    No obstetric history on file. Patient Active Problem List   Diagnosis Date Noted  . Otitis media 12/30/2019  . Essential hypertension 09/13/2019  . Acute pain of left knee 07/10/2018  . Acute pain of right shoulder 07/10/2018  . Hyperlipidemia 10/19/2016  . Murmur 07/23/2016  . Acute anxiety 03/05/2016  . Dysphagia 11/14/2015  . 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 History of urinary stone 10/29/2015  . History of urethral narrowing 07/16/2015  . Right ureteral stone 04/28/2015  . Gross hematuria 04/25/2015  . Urethral stricture 09/11/2014  . Urinary retention 09/11/2014  . Microscopic hematuria 09/11/2014  . Kidney stone 09/11/2014  . Incomplete emptying of bladder 09/11/2014  .  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 Esophageal reflux 05/25/2014  . Candidal dermatitis 05/25/2014  . Morbid obesity ([place]) 04/10/2012     1. Atypical nevus of labia majora        Plan:            1.  After long discussion regarding wide local excision of previous lesion and possible excision of secondary lesion versus expectant management and the risks of progression to melanoma, it was decided that we would obtain a  second opinion from a dermatologist at [facility] or [facility] before proceeding with further excision.  If they recommend further excision I would then perform a wider local excision of both lesions in the operating room.  This is the patient's desire. I will contact her once I have been able to make contact with a dermatologist and discuss this with him. Orders No orders of the defined types were placed in this encounter.   No orders of the defined types were placed in this encounter.     F/U  Return for We will contact her with any abnormal test results. I spent 31 minutes involved in the care of this patient preparing to see the patient by obtaining and reviewing her medical history (including labs, imaging tests and prior procedures), documenting clinical information in the electronic health record (EHR), counseling and coordinating care plans, writing and sending prescriptions, ordering tests or procedures and directly communicating with the patient by discussing pertinent items from her history and physical exam as well as detailing my assessment and plan as noted above so that she has an informed understanding.  All of her questions were answered.  Finis Bud, M.D. 02/18/2020 3:29 PM

## 2020-02-19 LAB — MICROSCOPIC EXAMINATION: Bacteria, UA: NONE SEEN

## 2020-02-19 LAB — URINALYSIS, COMPLETE
Bilirubin, UA: NEGATIVE
Glucose, UA: NEGATIVE
Ketones, UA: NEGATIVE
Leukocytes,UA: NEGATIVE
Nitrite, UA: NEGATIVE
Protein,UA: NEGATIVE
RBC, UA: NEGATIVE
Specific Gravity, UA: >1.03 — ABNORMAL HIGH (ref 1.005–1.030)
Urobilinogen, Ur: 0.2 mg/dL (ref 0.2–1.0)
pH, UA: 6 (ref 5.0–7.5)

## 2020-02-25 ENCOUNTER — Ambulatory Visit: Payer: 59 | Admitting: Urology

## 2020-03-07 ENCOUNTER — Other Ambulatory Visit: Payer: Self-pay | Admitting: Family Medicine

## 2020-03-07 NOTE — Telephone Encounter (Signed)
Requested medication (s) are due for refill today -yes  Requested medication (s) are on the active medication list -yes  Future visit scheduled -no  Last refill: 11/10/19  Notes to clinic: Rx RF request sent for review for continues use.  Requested Prescriptions  Pending Prescriptions Disp Refills   sucralfate (CARAFATE) 1 g tablet [Pharmacy Med Name: SUCRALFATE 1 GM TABLET 1 Tablet] 120 tablet 1    Sig: TAKE 1 TABLET BY MOUTH 4 TIMES DAILY - WITH MEALS AND AT BEDTIME.      Gastroenterology: Antiacids Passed - 03/07/2020  7:40 AM      Passed - Valid encounter within last 12 months    Recent Outpatient Visits           2 months ago Non-recurrent acute allergic otitis media of left ear   [facility] [place], Henrine Screws T, NP   2 months ago Left ear pain   [facility] [place], [place], DO   4 months ago Essential hypertension   [place], Megan P, DO   5 months ago Morbid obesity ([place])   [place], Megan P, DO   7 months ago Routine general medical examination at a health care facility   [place], Barb Merino, DO       Future Appointments             In 5 months [place], Gordan Payment [place]                Requested Prescriptions  Pending Prescriptions Disp Refills   sucralfate (CARAFATE) 1 g tablet [Pharmacy Med Name: SUCRALFATE 1 GM TABLET 1 Tablet] 120 tablet 1    Sig: TAKE 1 TABLET BY MOUTH 4 TIMES DAILY - WITH MEALS AND AT BEDTIME.      Gastroenterology: Antiacids Passed - 03/07/2020  7:40 AM      Passed - Valid encounter within last 12 months    Recent Outpatient Visits           2 months ago Non-recurrent acute allergic otitis media of left ear   [facility] [place], Henrine Screws T, NP   2 months ago Left ear pain   [facility] [place], [place], DO   4 months ago Essential hypertension   [place],  Megan P, DO   5 months ago Morbid obesity ([place])   [place], Megan P, DO   7 months ago Routine general medical examination at a health care facility   [facility], Barb Merino, DO       Future Appointments             In 5 months McGow[place], Shannon A, [place]

## 2020-03-28 ENCOUNTER — Telehealth: Payer: Self-pay

## 2020-03-28 ENCOUNTER — Other Ambulatory Visit: Payer: Self-pay | Admitting: Urology

## 2020-03-28 DIAGNOSIS — D239 Other benign neoplasm of skin, unspecified: Secondary | ICD-10-CM

## 2020-03-28 NOTE — Progress Notes (Unsigned)
Please let her know I have placed the referral.

## 2020-03-28 NOTE — Telephone Encounter (Signed)
Pt called in and was requesting to speak to the office manager. I told the pt that I will send a message to her and that she will return her call. The pt verbally understood. Please advise

## 2020-03-30 ENCOUNTER — Ambulatory Visit (INDEPENDENT_AMBULATORY_CARE_PROVIDER_SITE_OTHER): Payer: 59 | Admitting: Dermatology

## 2020-03-30 ENCOUNTER — Other Ambulatory Visit: Payer: Self-pay

## 2020-03-30 ENCOUNTER — Encounter: Payer: Self-pay | Admitting: Dermatology

## 2020-03-30 DIAGNOSIS — Z85828 Personal history of other malignant neoplasm of skin: Secondary | ICD-10-CM

## 2020-03-30 DIAGNOSIS — D2371 Other benign neoplasm of skin of right lower limb, including hip: Secondary | ICD-10-CM

## 2020-03-30 DIAGNOSIS — L821 Other seborrheic keratosis: Secondary | ICD-10-CM | POA: Diagnosis not present

## 2020-03-30 DIAGNOSIS — L814 Other melanin hyperpigmentation: Secondary | ICD-10-CM | POA: Diagnosis not present

## 2020-03-30 DIAGNOSIS — L578 Other skin changes due to chronic exposure to nonionizing radiation: Secondary | ICD-10-CM | POA: Diagnosis not present

## 2020-03-30 DIAGNOSIS — D229 Melanocytic nevi, unspecified: Secondary | ICD-10-CM

## 2020-03-30 DIAGNOSIS — Z1283 Encounter for screening for malignant neoplasm of skin: Secondary | ICD-10-CM

## 2020-03-30 DIAGNOSIS — D239 Other benign neoplasm of skin, unspecified: Secondary | ICD-10-CM

## 2020-03-30 DIAGNOSIS — D1800 Hemangioma unspecified site: Secondary | ICD-10-CM

## 2020-03-30 NOTE — Patient Instructions (Addendum)
Recommend daily broad spectrum sunscreen SPF 30+ to sun-exposed areas, reapply every 2 hours as needed. Call for new or changing lesions.  Call clinic for new or changing lesions.

## 2020-03-30 NOTE — Progress Notes (Signed)
Follow-Up Visit   Subjective  Latoya Lutz is a 49 y.o. female who presents for the following: Annual Exam.  The patient has history of Basal Cell Carcinoma treated on the back. The patient presents for Total-Body Skin Exam (TBSE) for skin cancer screening and mole check. 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  The patient was referred from her urologist to Dr. Nehemiah Massed for evaluation of a labial mucosal lesion. Dr. Nehemiah Massed saw the patient and performed a biopsy of the left labial mucosa on 10/27/2019.   Pathology from the left labial mucosa showed atypical melanocytic proliferation with comment "an early in situ melanoma cannot be excluded" .  The pathologist recommended excision. The patient was advised of the pathologic results and advised that we recommend excision.  She was also advised that the surgical procedure may be best done by her urologist or gynecologist due to the location.   Dr. Nehemiah Massed referred the patient to the patient;s gynecologist, Dr. Amalia Hailey for treatment.  Dr. Nehemiah Massed discussed the patient and pathology and diagnosis with Dr. Amalia Hailey, gynecologist by phone and recommended consideration for excision due to the significant atypia.  Again the referral to the gynecologist, Dr. Amalia Hailey for treatment was made due to the location of the labial mucosa, which is more within the specialty of gynecology (or urology ). There was also a second smaller lesion in the labial mucosa that Dr Nehemiah Massed recommended removing at some point also. I reviewed the pts office visit notes with Dr Amalia Hailey  x2 : 01/19/20 and 02/18/20. Dr Amalia Hailey and pt decided to seek second opinion at [facility] or [facility] dermatology prior to proceding with excision.  The patient states today that she did not get an appt at [facility] or [place], but she will be going to [facility] in [place] for further evaluation / additional  opinion. She will see Dr. Verner Chol who is a dermatologist and MOHS surgeon.   The following portions of the chart were reviewed this encounter and updated as appropriate: Tobacco  Allergies  Meds  Problems  Med Hx  Surg Hx  Fam Hx     Review of Systems: No other skin or systemic complaints except as noted in HPI or Assessment and Plan.  Objective  Well appearing patient in no apparent distress; mood and affect are within normal limits.  A full examination was performed including scalp, head, eyes, ears, nose, lips, neck, chest, axillae, abdomen, back, buttocks, bilateral upper extremities, bilateral lower extremities, hands, feet, fingers, toes, fingernails, and toenails. All findings within normal limits unless otherwise noted below.  Objective  left labial mucosa: Bx proven Atypical Melanocytic Proliferation.  Patient has 2 lesions at area. Both may need to be addressed. Bx was performed on lesion that was raised, there is also a macular lesion near Bx site.  Objective  Left Lower Back: Well healed scar with no evidence of recurrence.   Objective  Right Lower Leg - Anterior: Firm pink/brown papulenodule with dimple sign.   Assessment & Plan  Atypical melanocytic proliferation -biopsy-proven " an early in situ melanoma cannot be excluded" -of the left labial mucosa.  See pathology report left labial mucosa See above discussion in history. Patient was referred to GYN, Dr. Amalia Hailey for consideration for surgical treatment due to location of labial mucosa.  Dr. Amalia Hailey had a couple visits with long discussions with patient and they decided to have a second opinion at [facility]  or [facility] prior to surgical intervention.   The patient subsequently made her own appointment with [facility] [place] [facility] in [facility], Dr. Verner Chol who is a dermatologist Mohs surgeon for second opinion. Keep scheduled appointment at [facility] in [place] for evaluation.  There is a  second spot of the labial mucosa that should be considered for biopsy too considering pathology of above.   Pathology printed and given to patient today.  Will fax visit notes and pathology to [facility] [phone]  History of basal cell carcinoma (BCC) Left Lower Back Observe. Watch for recurrence.  Hx of excision  Dermatofibroma Right Lower Leg - Anterior Benign appearing, observe.     Lentigines - Scattered tan macules - Discussed due to sun exposure - Benign, observe - Call for any changes  Seborrheic Keratoses - Stuck-on, waxy, tan-brown papules and plaques  - Discussed benign etiology and prognosis. - Observe - Call for any changes  Melanocytic Nevi - Tan-brown and/or pink-flesh-colored symmetric macules and papules - Benign appearing on exam today - Observation - Call clinic for new or changing moles - Recommend daily use of broad spectrum spf 30+ sunscreen to sun-exposed areas.   Hemangiomas - Red papules - Discussed benign nature - Observe - Call for any changes  Actinic Damage - diffuse scaly erythematous macules with underlying dyspigmentation - Recommend daily broad spectrum sunscreen SPF 30+ to sun-exposed areas, reapply every 2 hours as needed.  - Call for new or changing lesions.  Skin cancer screening performed today.  Return in about 1 year (around 03/30/2021) for full skin exam.   I, Emelia Salisbury, CMA, am acting as scribe for Sarina Ser, MD.  Documentation: I have reviewed the above documentation for accuracy and completeness, and I agree with the above.  Sarina Ser, MD

## 2020-03-30 NOTE — Telephone Encounter (Signed)
Error

## 2020-03-30 NOTE — Telephone Encounter (Signed)
LMTRC

## 2020-03-31 ENCOUNTER — Encounter: Payer: Self-pay | Admitting: Dermatology

## 2020-04-04 NOTE — Telephone Encounter (Signed)
Pt wanted me to be aware that she was not happy with her care at [facility].   She states she has been waiting on a call from Latoya Lutz for 5 weeks. She went to her dermatologist last week and he expressed the concern to have this area removed. 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 Latoya Lutz informed the pt that he has had several conversations with Latoya Lutz about the need to remove this area. He doesn't understand what the hold up is.   She feels like if Latoya Lutz doesn't care enough to contact her back she will just go elsewhere. She will see Dr at [facility].  Pt states she has had 2 visits with Latoya Lutz and he has not laid a hand on her. She is refusing to pay her last visit.   She is asking me to get it written off. Pt is aware  the staff that handles the billing is on vacation. I will follow up once she is back.   Thanked pt for her feedback and apologized we did not meet her expectations.

## 2020-04-04 NOTE — Telephone Encounter (Signed)
LMTRC

## 2020-04-05 DIAGNOSIS — H524 Presbyopia: Secondary | ICD-10-CM | POA: Diagnosis not present

## 2020-04-15 NOTE — Telephone Encounter (Signed)
Spoke with [facility] regarding how to proceed with this complaint.   Monica reviewed DJE notes. No concerns with the note. She advised me to place a SZP.   SZP done- [id number].  Pt aware.

## 2020-04-17 ENCOUNTER — Encounter: Payer: Self-pay | Admitting: Family Medicine

## 2020-04-18 NOTE — Telephone Encounter (Signed)
Latoya Lutz Female, 49 y.o., 04/19/1998 [phone]  [id number] PT son MRN

## 2020-04-20 ENCOUNTER — Encounter: Payer: 59 | Admitting: Obstetrics and Gynecology

## 2020-05-17 ENCOUNTER — Telehealth: Payer: Self-pay

## 2020-05-17 NOTE — Telephone Encounter (Signed)
Pt states she needed a copy of her paper work that was left yesterday she didn't have a extra copy and only gave them to be reviewed.  Fax number is [phone].Document was faxed to her and found.

## 2020-05-23 ENCOUNTER — Other Ambulatory Visit: Payer: Self-pay | Admitting: Orthopedic Surgery

## 2020-05-24 ENCOUNTER — Other Ambulatory Visit: Payer: Self-pay | Admitting: Family Medicine

## 2020-05-24 NOTE — Telephone Encounter (Signed)
Requested Prescriptions  Pending Prescriptions Disp Refills   fluticasone (FLONASE) 50 MCG/ACT nasal spray [Pharmacy Med Name: FLUTICASONE PROP 50 MCG SPR 50 SUS] 48 g 3    Sig: PLACE 2 SPRAYS INTO BOTH NOSTRILS DAILY.     Ear, Nose, and Throat: Nasal Preparations - Corticosteroids Passed - 05/24/2020  4:19 PM      Passed - Valid encounter within last 12 months    Recent Outpatient Visits          4 months ago Non-recurrent acute allergic otitis media of left ear   [facility] [place], Henrine Screws T, NP   4 months ago Left ear pain   [facility] [place], [place], DO   7 months ago Essential hypertension   [place], Megan P, DO   8 months ago Morbid obesity ([place])   [place], Megan P, DO   9 months ago Routine general medical examination at a health care facility   [place], Barb Merino, DO      Future Appointments            In 2 months McGowan, Gordan Payment [place]

## 2020-05-25 DIAGNOSIS — D398 Neoplasm of uncertain behavior of other specified female genital organs: Secondary | ICD-10-CM | POA: Diagnosis not present

## 2020-05-25 DIAGNOSIS — D229 Melanocytic nevi, unspecified: Secondary | ICD-10-CM

## 2020-05-25 DIAGNOSIS — L988 Other specified disorders of the skin and subcutaneous tissue: Secondary | ICD-10-CM | POA: Diagnosis not present

## 2020-05-25 DIAGNOSIS — D225 Melanocytic nevi of trunk: Secondary | ICD-10-CM | POA: Diagnosis not present

## 2020-05-25 HISTORY — DX: Melanocytic nevi, unspecified: D22.9

## 2020-06-02 ENCOUNTER — Encounter: Payer: Self-pay | Admitting: Family Medicine

## 2020-06-02 ENCOUNTER — Other Ambulatory Visit: Payer: Self-pay | Admitting: Family Medicine

## 2020-06-02 DIAGNOSIS — R599 Enlarged lymph nodes, unspecified: Secondary | ICD-10-CM | POA: Insufficient documentation

## 2020-06-02 DIAGNOSIS — M47816 Spondylosis without myelopathy or radiculopathy, lumbar region: Secondary | ICD-10-CM | POA: Insufficient documentation

## 2020-06-02 DIAGNOSIS — R931 Abnormal findings on diagnostic imaging of heart and coronary circulation: Secondary | ICD-10-CM | POA: Insufficient documentation

## 2020-06-02 NOTE — Progress Notes (Signed)
Ref card

## 2020-06-13 ENCOUNTER — Ambulatory Visit
Admission: RE | Admit: 2020-06-13 | Discharge: 2020-06-13 | Disposition: A | Discharge status: Home / Self Care | Payer: 59 | Source: Ambulatory Visit | Attending: Family Medicine | Admitting: Family Medicine

## 2020-06-13 ENCOUNTER — Telehealth: Payer: Self-pay

## 2020-06-13 ENCOUNTER — Encounter: Payer: Self-pay | Admitting: Family Medicine

## 2020-06-13 ENCOUNTER — Other Ambulatory Visit: Payer: Self-pay

## 2020-06-13 ENCOUNTER — Other Ambulatory Visit: Payer: Self-pay | Admitting: Family Medicine

## 2020-06-13 ENCOUNTER — Telehealth (INDEPENDENT_AMBULATORY_CARE_PROVIDER_SITE_OTHER): Payer: 59 | Admitting: Family Medicine

## 2020-06-13 DIAGNOSIS — U071 COVID-19: Secondary | ICD-10-CM

## 2020-06-13 DIAGNOSIS — R059 Cough, unspecified: Secondary | ICD-10-CM | POA: Diagnosis not present

## 2020-06-13 MED ORDER — PREDNISONE 50 MG PO TABS: 50.0000 mg | ORAL_TABLET | Freq: Every day | ORAL | 0 refills | Status: DC

## 2020-06-13 MED ORDER — ALBUTEROL SULFATE HFA 108 (90 BASE) MCG/ACT IN AERS: 2.0000 | INHALATION_SPRAY | Freq: Four times a day (QID) | RESPIRATORY_TRACT | 0 refills | Status: DC | PRN

## 2020-06-13 NOTE — Telephone Encounter (Signed)
Pt states that when she called about her husband she was informed by the nurse that she needs to be seen as well. Pt states that she is positive as well and her symptoms started last sun 12/26. Scheduled virtual tomorrow

## 2020-06-13 NOTE — Progress Notes (Signed)
There were no vitals taken for this visit.   Subjective:    Patient ID: Latoya Lutz, female    DOB: 17-Dec-1970, 50 y.o.   MRN: [id number]  HPI: Latoya Lutz is a 50 y.o. female  Chief Complaint  Patient presents with  . Covid Positive    Pt states she tested positive for covid on 12/27. Has cough, and shortness of breath.    UPPER RESPIRATORY TRACT INFECTION- symptoms started on 12/26 and tested positive for COVID on 12/27 Duration: 1 week Worst symptom: cough and SOB Fever: no Cough: yes Shortness of breath: yes Wheezing: yes Chest pain: no Chest tightness: no Chest congestion: yes Nasal congestion: yes Runny nose: no Post nasal drip: no Sneezing: no Sore throat: no Swollen glands: no Sinus pressure: no Headache: yes Face pain: no Toothache: no Ear pain: no  Ear pressure: no  Eyes red/itching:no Eye drainage/crusting: no  Vomiting: no Rash: no Fatigue: yes Sick contacts: yes Strep contacts: no  Context: better Recurrent sinusitis: no Relief with OTC cold/cough medications: no  Treatments attempted: mucinex and delsym, sudafed   Relevant past medical, surgical, family and social history reviewed and updated as indicated. Interim medical history since our last visit reviewed. Allergies and medications reviewed and updated.  Review of Systems  Constitutional: Positive for fatigue. Negative for activity change, appetite change, chills, diaphoresis, fever and unexpected weight change.  HENT: Positive for congestion, postnasal drip, rhinorrhea and sinus pressure. Negative for dental problem, drooling, ear discharge, ear pain, facial swelling, hearing loss, mouth sores, nosebleeds, sinus pain, sneezing, sore throat, tinnitus, trouble swallowing and voice change.   Respiratory: Positive for cough, shortness of breath and wheezing. Negative for apnea, choking, chest tightness and stridor.   Cardiovascular: Negative.   Gastrointestinal: Negative.    Psychiatric/Behavioral: Negative.     Per HPI unless specifically indicated above     Objective:    There were no vitals taken for this visit.  Wt Readings from Last 3 Encounters:  02/18/20 294 lb 5 oz (133.5 kg)  02/17/20 296 lb (134.3 kg)  01/19/20 295 lb (133.8 kg)    Physical Exam Vitals and nursing note reviewed.  Pulmonary:     Effort: Pulmonary effort is normal. No respiratory distress.     Comments: Speaking in full sentences Neurological:     Mental Status: She is alert.  Psychiatric:        Mood and Affect: Mood normal.        Behavior: Behavior normal.        Thought Content: Thought content normal.        Judgment: Judgment normal.     Results for orders placed or performed in visit on 02/17/20  Microscopic Examination   Urine  Result Value Ref Range   WBC, UA 0-5 0 - 5 /hpf   RBC 0-2 0 - 2 /hpf   Epithelial Cells (non renal) 0-10 0 - 10 /hpf   Bacteria, UA None seen None seen/Few  Urinalysis, Complete  Result Value Ref Range   Specific Gravity, UA >1.030 (H) 1.005 - 1.030   pH, UA 6.0 5.0 - 7.5   Color, UA Yellow Yellow   Appearance Ur Hazy (A) Clear   Leukocytes,UA Negative Negative   Protein,UA Negative Negative/Trace   Glucose, UA Negative Negative   Ketones, UA Negative Negative   RBC, UA Negative Negative   Bilirubin, UA Negative Negative   Urobilinogen, Ur 0.2 0.2 - 1.0 mg/dL   Nitrite, UA Negative  Negative   Microscopic Examination See below:       Assessment & Plan:   Problem List Items Addressed This Visit   None   Visit Diagnoses    [id number]    -  Primary   Patient still coughing on 7 days out. Out of work for at least 10 days/until symptoms resolve. Prednisone and albuterol. Will obtain CXR. Call with concerns.        Follow up plan: Return if symptoms worsen or fail to improve.    . This visit was completed via telephone due to the restrictions of the COVID-19 pandemic. All issues as above were discussed and addressed  but no physical exam was performed. If it was felt that the patient should be evaluated in the office, they were directed there. The patient verbally consented to this visit. Patient was unable to complete an audio/visual visit due to lack of internet. Due to the catastrophic nature of the COVID-19 pandemic, this visit was done through audio contact only. . Location of the patient: home . Location of the provider: work . Those involved with this call:  . Provider: Park Liter, DO . CMA: Louanna Raw, [place] . Front Desk/Registration: Jill Side  . Time spent on call: 21 minutes on the phone discussing health concerns. 30 minutes total spent in review of patient's record and preparation of their chart.

## 2020-06-13 NOTE — Telephone Encounter (Signed)
Called pt's husband to schedule appt per Dr Laural Benes, pt is saying that she had spoken to a nurse and they advised her that she needed to be seen too. Pt is wanting to know if she can come in with her husband today. He is scheduled to come in at 2:20. Please advise.

## 2020-06-13 NOTE — Telephone Encounter (Signed)
Appt today

## 2020-06-14 ENCOUNTER — Telehealth: Payer: 59 | Admitting: Family Medicine

## 2020-06-24 ENCOUNTER — Telehealth: Payer: Self-pay

## 2020-06-24 ENCOUNTER — Encounter: Payer: Self-pay | Admitting: Cardiovascular Disease

## 2020-06-24 ENCOUNTER — Ambulatory Visit (INDEPENDENT_AMBULATORY_CARE_PROVIDER_SITE_OTHER): Payer: 59 | Admitting: Cardiovascular Disease

## 2020-06-24 ENCOUNTER — Other Ambulatory Visit: Payer: Self-pay | Admitting: Cardiovascular Disease

## 2020-06-24 ENCOUNTER — Other Ambulatory Visit: Payer: Self-pay

## 2020-06-24 VITALS — BP 130/80 | HR 71 | Ht 65.0 in | Wt 290.0 lb

## 2020-06-24 DIAGNOSIS — M791 Myalgia, unspecified site: Secondary | ICD-10-CM | POA: Diagnosis not present

## 2020-06-24 DIAGNOSIS — E782 Mixed hyperlipidemia: Secondary | ICD-10-CM

## 2020-06-24 DIAGNOSIS — I2510 Atherosclerotic heart disease of native coronary artery without angina pectoris: Secondary | ICD-10-CM

## 2020-06-24 DIAGNOSIS — M7989 Other specified soft tissue disorders: Secondary | ICD-10-CM | POA: Diagnosis not present

## 2020-06-24 DIAGNOSIS — T466X5A Adverse effect of antihyperlipidemic and antiarteriosclerotic drugs, initial encounter: Secondary | ICD-10-CM | POA: Diagnosis not present

## 2020-06-24 DIAGNOSIS — R931 Abnormal findings on diagnostic imaging of heart and coronary circulation: Secondary | ICD-10-CM

## 2020-06-24 MED ORDER — EZETIMIBE 10 MG PO TABS: 10.0000 mg | ORAL_TABLET | Freq: Every day | ORAL | 3 refills | Status: DC

## 2020-06-24 NOTE — Patient Instructions (Addendum)
Medication Instructions:  Please add zetia 10 mg daily  Try COQ10   If you need a refill on your cardiac medications before your next appointment, please call your pharmacy.    Lab work: No new labs needed   If you have labs (blood work) drawn today and your tests are completely normal, you will receive your results only by: Marland Kitchen MyChart Message (if you have MyChart) OR . A paper copy in the mail If you have any lab test that is abnormal or we need to change your treatment, we will call you to review the results.   Testing/Procedures: No new testing needed   Follow-Up: At [facility], you and your health needs are our priority.  As part of our continuing mission to provide you with exceptional heart care, we have created designated Provider Care Teams.  These Care Teams include your primary Cardiologist (physician) and Advanced Practice Providers (APPs -  Physician Assistants and Nurse Practitioners) who all work together to provide you with the care you need, when you need it.  . You will need a follow up appointment as needed  . Providers on your designated Care Team:   . Murray Hodgkins, NP . Christell Faith, PA-C . Marrianne Mood, PA-C  Any Other Special Instructions Will Be Listed Below (If Applicable).  COVID-19 Vaccine Information can be found at: [email or web address].uk For questions related to vaccine distribution or appointments, please email [email or web address] or call [phone].

## 2020-06-24 NOTE — Progress Notes (Signed)
Cardiology Office Note  Date:  06/24/2020   ID:  Latoya Lutz, DOB [birth year], MRN [id number]  PCP:  Valerie Roys, DO   Chief Complaint  Patient presents with  . New Patient (Initial Visit)    Referred by PCP for abnormal CT results. Meds reviewed verbally with patient.     HPI:  Latoya Lutz is a 50 year old woman with past medical history of COVID in December 2021 Kidney stones Prior smoking, 15 yr total, quit 2010 No diabetes Presenting by referral from Park Liter for consultation of her abnormal cardiac CT score  Presents today with her husband Prior history of smoking discussed with her, total may be 15 years  Works long hours, no regular exercise program  Some leg swelling, comes and goes  Tested positive for COVID June 06, 2020 Had cough, shortness of breath  Lab work reviewed Total cholesterol 172, LDL 102 in April 2020 total chol 154, LDL 87 this past year On pravastatin 20 Prior myalgias on Lipitor  Continues to have mild leg cramps Taking magnesium, mustard, symptoms better but has to keep taking supplements  CT coronary calcium score reviewed in detail, score of 217, predominantly in the right coronary artery Nonspecific finding enlarged prevascular 1.4 cm lymph node  Denies any anginal symptoms  EKG personally reviewed by myself on todays visit No significant ST-T wave changes   PMH:   has a past medical history of Acid reflux, Actinic keratosis, Arthritis, Atypical mole (10/27/2019), Candidal dermatitis (05/25/2014), Cervical spine disease, Chicken pox, Chronic urethral narrowing, Complication of anesthesia, Endometriosis, Esophageal reflux (05/25/2014), Gross hematuria (04/25/2015), H/O cystoscopy, Heart murmur, basal cell carcinoma (12/29/2008), Infection of urinary tract (09/11/2014), Kidney stone (09/11/2014), Kidney stones, Microscopic hematuria (09/11/2014), Obesity, morbid, BMI 40.0-49.9 ([place]) (04/10/2012), PONV (postoperative  nausea and vomiting), Right ureteral stone (04/28/2015), Skin cancer, and Urinary retention.  PSH:    Past Surgical History:  Procedure Laterality Date  . ABDOMINAL HYSTERECTOMY     [facility] complete  . BLADDER SURGERY  07/2003  . CARPAL TUNNEL RELEASE Right   . CESAREAN SECTION  1998  . CHOLECYSTECTOMY    . CYSTOSCOPY W/ RETROGRADES Right 05/16/2015   Procedure: CYSTOSCOPY WITH RETROGRADE PYELOGRAM;  Surgeon: Nickie Retort, MD;  Location: [facility] ORS;  Service: Urology;  Laterality: Right;  . CYSTOSCOPY W/ URETERAL STENT PLACEMENT Right 05/16/2015   Procedure: CYSTOSCOPY WITH STENT REPLACEMENT;  Surgeon: Nickie Retort, MD;  Location: [facility];  Service: Urology;  Laterality: Right;  . CYSTOSCOPY WITH STENT PLACEMENT Right 04/29/2015   Procedure: CYSTOSCOPY WITH STENT PLACEMENT;  Surgeon: Nickie Retort, MD;  Location: [facility];  Service: Urology;  Laterality: Right;  . EXTRACORPOREAL SHOCK WAVE LITHOTRIPSY Left 06/02/2015   Procedure: EXTRACORPOREAL SHOCK WAVE LITHOTRIPSY (ESWL);  Surgeon: Hollice Espy, MD;  Location: [facility] ORS;  Service: Urology;  Laterality: Left;  . FOOT SURGERY  2003  . KNEE SURGERY  2005  . TONSILLECTOMY    . TONSILLECTOMY     lingual growth on vocal cord removal  . URETEROSCOPY WITH HOLMIUM LASER LITHOTRIPSY Right 05/16/2015   Procedure: URETEROSCOPY WITH HOLMIUM LASER LITHOTRIPSY;  Surgeon: Nickie Retort, MD;  Location: [facility];  Service: Urology;  Laterality: Right;    Current Outpatient Medications  Medication Sig Dispense Refill  . acyclovir (ZOVIRAX) 400 MG tablet Take 1 tablet (400 mg total) by mouth 3 (three) times daily. 30 tablet 12  . albuterol (VENTOLIN HFA) 108 (90 Base) MCG/ACT inhaler Inhale 2 puffs into the lungs  every 6 (six) hours as needed for wheezing or shortness of breath. 8 g 0  . azelastine (ASTELIN) 0.1 % nasal spray Place 1 spray into both nostrils 2 (two) times daily. 30 mL 12  . busPIRone (BUSPAR) 10 MG tablet Take 1 tablet  (10 mg total) by mouth 3 (three) times daily. 90 tablet 6  . Calcipotriene-Betameth Diprop (ENSTILAR) 0.005-0.064 % FOAM Apply 1 application topically as directed. Qd up to 5 days a week aa psoriasis on elbows prn flares 60 g 2  . Calcium Carbonate (CALCIUM 600 PO) Take 1 tablet by mouth daily.     . cyclobenzaprine (FLEXERIL) 10 MG tablet Take 1 tablet (10 mg total) by mouth at bedtime. 30 tablet 2  . diclofenac Sodium (VOLTAREN) 1 % GEL     . fexofenadine (ALLEGRA) 180 MG tablet Take 180 mg by mouth daily.    . fluticasone (FLONASE) 50 MCG/ACT nasal spray PLACE 2 SPRAYS INTO BOTH NOSTRILS DAILY. 48 g 3  . hydrochlorothiazide (HYDRODIURIL) 25 MG tablet Take 1 tablet (25 mg total) by mouth daily. 90 tablet 1  . meloxicam (MOBIC) 15 MG tablet Take 1 tablet (15 mg total) by mouth daily. 90 tablet 3  . Nutritional Supplements (MENOPAUSE FORMULA) TABS Take by mouth. Reported on 11/04/2015    . pantoprazole (PROTONIX) 40 MG tablet TAKE 1 TABLET BY MOUTH DAILY. 90 tablet 1  . pravastatin (PRAVACHOL) 20 MG tablet TAKE 1 TABLET BY MOUTH DAILY 90 tablet 4  . predniSONE (DELTASONE) 50 MG tablet Take 1 tablet (50 mg total) by mouth daily with breakfast. 5 tablet 0  . pseudoephedrine (SUDAFED) 30 MG tablet Take 30 mg by mouth 3 (three) times daily.    Marland Kitchen SAXENDA 18 MG/3ML SOPN Inject 3 mg into the skin daily.    . Simethicone 180 MG CAPS Take 1 capsule (180 mg total) by mouth 3 (three) times daily with meals. 90 capsule 6  . sucralfate (CARAFATE) 1 g tablet TAKE 1 TABLET BY MOUTH 4 TIMES DAILY - WITH MEALS AND AT BEDTIME. 120 tablet 1  . traMADol (ULTRAM) 50 MG tablet     . UNIFINE PENTIPS 31G X 8 MM MISC     . vitamin E 400 UNIT capsule Take 400 Units by mouth daily.     No current facility-administered medications for this visit.     Allergies:   Atorvastatin   Social History:  The patient  reports that she quit smoking about 12 years ago. Her smoking use included cigarettes. She has a 8.50 pack-year  smoking history. She has never used smokeless tobacco. She reports that she does not drink alcohol and does not use drugs.   Family History:   family history includes COPD in her father; Cancer in her father, maternal grandmother, and paternal grandmother; Cirrhosis in her paternal grandmother; Diabetes in her paternal grandmother; Heart disease in her mother; Hyperlipidemia in her mother; Hypertension in her mother.    Review of Systems: Review of Systems  Constitutional: Negative.   HENT: Negative.   Respiratory: Negative.   Cardiovascular: Positive for leg swelling.  Gastrointestinal: Negative.   Musculoskeletal: Negative.        Muscle cramp  Neurological: Negative.   Psychiatric/Behavioral: Negative.   All other systems reviewed and are negative.   PHYSICAL EXAM: VS:  BP 130/80 (BP Location: Right Arm, Patient Position: Sitting, Cuff Size: Normal)   Pulse 71   Ht 5\' 5"  (1.651 m)   Wt 290 lb (131.5 kg)  BMI 48.26 kg/m  , BMI Body mass index is 48.26 kg/m. GEN: Well nourished, well developed, in no acute distress HEENT: normal Neck: no JVD, carotid bruits, or masses Cardiac: RRR; no murmurs, rubs, or gallops,no edema  Respiratory:  clear to auscultation bilaterally, normal work of breathing GI: soft, nontender, nondistended, + BS MS: no deformity or atrophy Skin: warm and dry, no rash Neuro:  Strength and sensation are intact Psych: euthymic mood, full affect   Recent Labs: 08/10/2019: ALT 11; TSH 2.150 09/10/2019: Hemoglobin 12.0; Magnesium 2.0; Platelets 238 10/15/2019: BUN 16; Creatinine, Ser 0.76; Potassium 3.9; Sodium 142    Lipid Panel Lab Results  Component Value Date   CHOL 154 08/10/2019   HDL 45 08/10/2019   LDLCALC 87 08/10/2019   TRIG 120 08/10/2019      Wt Readings from Last 3 Encounters:  06/24/20 290 lb (131.5 kg)  02/18/20 294 lb 5 oz (133.5 kg)  02/17/20 296 lb (134.3 kg)       ASSESSMENT AND PLAN:  Problem List Items Addressed This  Visit      Cardiology Problems   Hyperlipidemia   Relevant Medications   ezetimibe (ZETIA) 10 MG tablet   Other Relevant Orders   EKG 12-Lead     Other   Abnormal screening cardiac CT   Morbid obesity ([place])    Other Visit Diagnoses    Coronary artery calcification seen on CAT scan    -  Primary   Relevant Medications   ezetimibe (ZETIA) 10 MG tablet   Other Relevant Orders   EKG 12-Lead   Leg swelling       Myalgia due to statin         Coronary calcification seen on CT scan Low calcium score, no angina, prior smoking history, hyperlipidemia Discussed pathology of atherosclerosis No further testing needed at this time Discussed first treatment options for managing hyperlipidemia Continues to have some leg cramping on pravastatin, Ideally goal LDL less than 70 Prescription provided for Zetia 10 mg daily This will allow her to consider pravastatin 10 alternating with 20, or skipping a day or 2 of pravastatin during the week in effort to improve her cramping -Alternatively could try Crestor 5 every other day or daily with Zetia  Leg swelling Likely exacerbated by weight Symptoms stable on HCTZ Recommended periodic breaks for walking, could try compression hose but this irritates her knees Dress socks perhaps with leg elevation  Morbid obesity We have encouraged continued exercise, careful diet management in an effort to lose weight.    Total encounter time more than 60 minutes  Greater than 50% was spent in counseling and coordination of care with the patient  Patient seen in consultation from Park Liter will be referred back to her office for ongoing care of the issues detailed above   Signed, Esmond Plants, M.D., Ph.D. [place], [place]

## 2020-06-24 NOTE — Telephone Encounter (Signed)
Able to reach pt, Dr. Rockey Situ asked to call to see if she could bring her results of her recent CT calcium score results, they have not been scanned in and she has an appt today with Dr. Rockey Situ, pt works within the building and will bring them down before her appt, so Dr. Rockey Situ has a chance to review.

## 2020-06-27 ENCOUNTER — Other Ambulatory Visit: Payer: Self-pay | Admitting: Family Medicine

## 2020-06-27 DIAGNOSIS — K219 Gastro-esophageal reflux disease without esophagitis: Secondary | ICD-10-CM

## 2020-07-14 DIAGNOSIS — L818 Other specified disorders of pigmentation: Secondary | ICD-10-CM | POA: Diagnosis not present

## 2020-07-14 DIAGNOSIS — C510 Malignant neoplasm of labium majus: Secondary | ICD-10-CM | POA: Diagnosis not present

## 2020-07-15 ENCOUNTER — Other Ambulatory Visit: Payer: Self-pay

## 2020-07-15 ENCOUNTER — Ambulatory Visit
Admission: RE | Admit: 2020-07-15 | Discharge: 2020-07-15 | Disposition: A | Discharge status: Home / Self Care | Payer: 59 | Source: Ambulatory Visit | Attending: Urology | Admitting: Urology

## 2020-07-15 ENCOUNTER — Ambulatory Visit (INDEPENDENT_AMBULATORY_CARE_PROVIDER_SITE_OTHER): Payer: 59 | Admitting: Urology

## 2020-07-15 VITALS — BP 117/83 | HR 76 | Wt 290.0 lb

## 2020-07-15 DIAGNOSIS — R109 Unspecified abdominal pain: Secondary | ICD-10-CM | POA: Diagnosis not present

## 2020-07-15 DIAGNOSIS — R10A0 Flank pain, unspecified side: Secondary | ICD-10-CM

## 2020-07-15 LAB — URINALYSIS, COMPLETE
Bilirubin, UA: NEGATIVE
Glucose, UA: NEGATIVE
Ketones, UA: NEGATIVE
Nitrite, UA: NEGATIVE
Protein,UA: NEGATIVE
RBC, UA: NEGATIVE
Specific Gravity, UA: 1.025 (ref 1.005–1.030)
Urobilinogen, Ur: 0.2 mg/dL (ref 0.2–1.0)
pH, UA: 5.5 (ref 5.0–7.5)

## 2020-07-15 LAB — BLADDER SCAN AMB NON-IMAGING: Scan Result: 50

## 2020-07-15 LAB — MICROSCOPIC EXAMINATION

## 2020-07-15 MED ORDER — ONDANSETRON HCL 4 MG PO TABS
4.0000 mg | ORAL_TABLET | Freq: Three times a day (TID) | ORAL | 0 refills | Status: DC | PRN
Start: 2020-07-15 — End: 2020-07-15

## 2020-07-15 MED ORDER — TRAMADOL HCL 50 MG PO TABS
50.0000 mg | ORAL_TABLET | Freq: Four times a day (QID) | ORAL | 0 refills | Status: DC | PRN
Start: 2020-07-15 — End: 2020-07-15

## 2020-07-15 MED ORDER — TAMSULOSIN HCL 0.4 MG PO CAPS: 0.4000 mg | ORAL_CAPSULE | Freq: Every day | ORAL | 3 refills | Status: DC

## 2020-07-17 ENCOUNTER — Encounter: Payer: Self-pay | Admitting: Urology

## 2020-07-17 NOTE — Progress Notes (Signed)
07/15/2020 8:25 PM   [place] [birth year] [id number]  Referring provider: Valerie Roys, DO [place],  [place] [address]  Chief Complaint  Patient presents with  . Nephrolithiasis   Urological history: 1. Urethral stricture - diagnosed by Dr. Madelin Headings with urethral dilation and had been receiving serial dilations every 4 to 6 months since 2013 - managed with urethral dilation q 6 months at this time  2. Nephrolithiasis - right ureteral stones treated with ESWL and right URS/LL/right ureteral stent placement and stent removal in winter 2016 - CT Renal stone study performed on 04/05/2017 noted no nephroureterolithiasis. No hydronephrosis  HPI: Latoya Lutz is a 50 y.o. female who presents today for experiencing cramping, back pain, stomach pain and feeling like her insides are going to fall out when voiding.  She states Tuesday she started to experience right-sided back pain which she initially attributed to moving some furniture.  The pain then started to radiate into her stomach area.  She describes the pain as a cramping sensation associated with feeling bloated, some nausea and belching.  The pain is now in both flank areas.    Patient denies any modifying or aggravating factors.  Patient denies any gross hematuria.  Patient denies any fevers, chills or vomiting.   Her UA is benign.  Her PVR is 50 mL  KUB a small radiopaque calcification is noted in the vicinity of the left UVJ.   PMH: Past Medical History:  Diagnosis Date  . Acid reflux   . Actinic keratosis   . Arthritis   . Atypical mole 10/27/2019   Left labia mucosa   . Candidal dermatitis 05/25/2014  . Cervical spine disease   . Chicken pox   . Chronic urethral narrowing    undergoing stretching  . Complication of anesthesia   . Endometriosis    Dr. Rushie Chestnut at [facility], removed   . Esophageal reflux 05/25/2014  . Gross hematuria 04/25/2015  . H/O cystoscopy    normal  . Heart murmur    . Hx of basal cell carcinoma 12/29/2008   L low back  . Infection of urinary tract 09/11/2014  . Kidney stone 09/11/2014  . Kidney stones   . Microscopic hematuria 09/11/2014  . Obesity, morbid, BMI 40.0-49.9 ([place]) 04/10/2012  . PONV (postoperative nausea and vomiting)   . Right ureteral stone 04/28/2015  . Skin cancer   . Urinary retention     Surgical History: Past Surgical History:  Procedure Laterality Date  . ABDOMINAL HYSTERECTOMY     [facility] complete  . BLADDER SURGERY  07/2003  . CARPAL TUNNEL RELEASE Right   . CESAREAN SECTION  1998  . CHOLECYSTECTOMY    . CYSTOSCOPY W/ RETROGRADES Right 05/16/2015   Procedure: CYSTOSCOPY WITH RETROGRADE PYELOGRAM;  Surgeon: Nickie Retort, MD;  Location: [facility] OR[facility];  Service: Urology;  Laterality: Right;  . CYSTOSCOPY W/ URETERAL STENT PLACEMENT Right 05/16/2015   Procedure: CYSTOSCOPY WITH STENT REPLACEMENT;  Surgeon: Nickie Retort, MD;  Location: [facility] OR[facility];  Service: Urology;  Laterality: Right;  . CYSTOSCOPY WITH STENT PLACEMENT Right 04/29/2015   Procedure: CYSTOSCOPY WITH STENT PLACEMENT;  Surgeon: Nickie Retort, MD;  Location: [facility] OR[facility];  Service: Urology;  Laterality: Right;  . EXTRACORPOREAL SHOCK WAVE LITHOTRIPSY Left 06/02/2015   Procedure: EXTRACORPOREAL SHOCK WAVE LITHOTRIPSY (ESWL);  Surgeon: Hollice Espy, MD;  Location: [facility] OR[facility];  Service: Urology;  Laterality: Left;  . FOOT SURGERY  2003  . KNEE SURGERY  2005  . TONSILLECTOMY    . TONSILLECTOMY     lingual growth on vocal cord removal  . URETEROSCOPY WITH HOLMIUM LASER LITHOTRIPSY Right 05/16/2015   Procedure: URETEROSCOPY WITH HOLMIUM LASER LITHOTRIPSY;  Surgeon: Hildred Laser, MD;  Location: [facility];  Service: Urology;  Laterality: Right;    Home Medications:  Allergies as of 07/15/2020      Reactions   Atorvastatin Other (See Comments)   Leg cramps      Medication List       Accurate as of July 15, 2020 11:59 PM. If you have any questions, ask your  nurse or doctor.        STOP taking these medications   predniSONE 50 MG tablet Commonly known as: DELTASONE Stopped by: Avyon Herendeen, PA-C     TAKE these medications   acyclovir 400 MG tablet Commonly known as: ZOVIRAX Take 1 tablet (400 mg total) by mouth 3 (three) times daily.   albuterol 108 (90 Base) MCG/ACT inhaler Commonly known as: VENTOLIN HFA Inhale 2 puffs into the lungs every 6 (six) hours as needed for wheezing or shortness of breath.   azelastine 0.1 % nasal spray Commonly known as: ASTELIN Place 1 spray into both nostrils 2 (two) times daily.   busPIRone 10 MG tablet Commonly known as: BUSPAR Take 1 tablet (10 mg total) by mouth 3 (three) times daily.   CALCIUM 600 PO Take 1 tablet by mouth daily.   cyclobenzaprine 10 MG tablet Commonly known as: FLEXERIL Take 1 tablet (10 mg total) by mouth at bedtime.   diclofenac Sodium 1 % Gel Commonly known as: VOLTAREN   Enstilar 0.005-0.064 % Foam Generic drug: Calcipotriene-Betameth Diprop Apply 1 application topically as directed. 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   fexofenadine 180 MG tablet Commonly known as: ALLEGRA Take 180 mg by mouth daily.   fluticasone 50 MCG/ACT nasal spray Commonly known as: FLONASE PLACE 2 SPRAYS INTO BOTH NOSTRILS DAILY.   hydrochlorothiazide 25 MG tablet Commonly known as: HYDRODIURIL TAKE 1 TABLET BY MOUTH DAILY.   meloxicam 15 MG tablet Commonly known as: MOBIC Take 1 tablet (15 mg total) by mouth daily.   Menopause Formula Tabs Take by mouth. Reported on 11/04/2015   ondansetron 4 MG tablet Commonly known as: ZOFRAN Take 1 tablet (4 mg total) by mouth every 8 (eight) hours as needed for nausea or vomiting. Started by: Michiel Cowboy, PA-C   pantoprazole 40 MG tablet Commonly known as: PROTONIX TAKE 1 TABLET BY MOUTH DAILY.   pravastatin  20 MG tablet Commonly known as: PRAVACHOL TAKE 1 TABLET BY MOUTH DAILY   pseudoephedrine 30 MG tablet Commonly known as: SUDAFED Take 30 mg by mouth 3 (three) times daily.   Saxenda 18 MG/3ML Sopn Generic drug: Liraglutide -Weight Management Inject 3 mg into the skin daily.   Simethicone 180 MG Caps Take 1 capsule (180 mg total) by mouth 3 (three) times daily with meals.   sucralfate 1 g tablet Commonly known as: CARAFATE TAKE 1 TABLET BY MOUTH 4 TIMES DAILY - WITH MEALS AND AT BEDTIME.   tamsulosin 0.4 MG Caps capsule Commonly known as: FLOMAX Take 1 capsule (0.4 mg total) by mouth daily. Started by: Michiel Cowboy, PA-C   traMADol 50 MG tablet Commonly known as: ULTRAM Take 1 tablet (50 mg total) by mouth every  6 (six) hours as needed. What changed:   how much to take  how to take this  when to take this  reasons to take this Changed by: Wisam Siefring, PA-C   Unifine Pentips 31G X 8 MM Misc Generic drug: Insulin Pen Needle   vitamin E 180 MG (400 UNITS) capsule Take 400 Units by mouth daily.       Allergies:  Allergies  Allergen Reactions  . Atorvastatin Other (See Comments)    Leg cramps    Family History: Family History  Problem Relation Age of Onset  . Heart disease Mother        Pacemaker and defib  . Hypertension Mother   . Hyperlipidemia Mother   . Cancer Father   . COPD Father        Lung and brain cancer  . Cancer Maternal Grandmother        Colon  . Diabetes Paternal Grandmother   . Cirrhosis Paternal Grandmother   . Cancer Paternal Grandmother   . Kidney disease Neg Hx   . Bladder Cancer Neg Hx   . Breast cancer Neg Hx     Social History:  reports that she quit smoking about 12 years ago. Her smoking use included cigarettes. She has a 8.50 pack-year smoking history. She has never used smokeless tobacco. She reports that she does not drink alcohol and does not use drugs.  ROS: Pertinent ROS in HPI  Physical Exam: BP 117/83    Pulse 76   Wt 290 lb (131.5 kg)   BMI 48.26 kg/m   Constitutional:  Well nourished. Alert and oriented, No acute distress. HEENT: [place] AT, mask in place.  Trachea midline Cardiovascular: No clubbing, cyanosis, or edema. Respiratory: Normal respiratory effort, no increased work of breathing. Neurologic: Grossly intact, no focal deficits, moving all 4 extremities. Psychiatric: Normal mood and affect.  Laboratory Data: Urinalysis Component     Latest Ref Rng & Units 07/15/2020  Specific Gravity, UA     1.005 - 1.030 1.025  pH, UA     5.0 - 7.5 5.5  Color, UA     Yellow Yellow  Appearance Ur     Clear Cloudy (A)  Leukocytes,UA     Negative Trace (A)  Protein,UA     Negative/Trace Negative  Glucose, UA     Negative Negative  Ketones, UA     Negative Negative  RBC, UA     Negative Negative  Bilirubin, UA     Negative Negative  Urobilinogen, Ur     0.2 - 1.0 mg/dL 0.2  Nitrite, UA     Negative Negative  Microscopic Examination      See below:   Component     Latest Ref Rng & Units 07/15/2020  WBC, UA     0 - 5 /hpf 0-5  RBC     0 - 2 /hpf 0-2  Epithelial Cells (non renal)     0 - 10 /hpf 0-10  Bacteria, UA     None seen/Few Few  I have reviewed the labs.   Pertinent Imaging: CLINICAL DATA:  Abdominal bloating, flank pain.  EXAM: ABDOMEN - 1 VIEW  COMPARISON:  November 09, 2016.  FINDINGS: The bowel gas pattern is normal. No radio-opaque calculi or other significant radiographic abnormality are seen.  IMPRESSION: Negative.   Electronically Signed   By: Marijo Conception M.D.   On: 07/15/2020 15:15  Results for RIHAN, BARTMAN" (MRN [id number]) as of 07/17/2020  20:22  Ref. Range 07/15/2020 10:44  Scan Result Unknown 50 ml   Assessment & Plan:    1. Flank pain - KUB with a left UVJ stone - Urinalysis, Complete - CULTURE, URINE COMPREHENSIVE - BLADDER SCAN AMB NON-IMAGING -Given prescription for tramadol 50 mg, #10 every 6 hours as  needed for pain after review of PDMP -She is given a prescription for Zofran for nausea -She is given a prescription for tamsulosin 0.4 mg daily for medical expulsive therapy -She has a strainer at home and will continue to strain her urine -She will return next week for follow-up KUB She will call the office or seek treatment in the ED if she should experience worsening of her urinary symptoms, worsening of her pain, fever/chills or intractable nausea/vomiting   Return in about 1 week (around 07/22/2020) for KUB.  These notes generated with voice recognition software. I apologize for typographical errors.  Zara Council, PA-C  [facility] [address]  [place], [place] [address] [phone]

## 2020-07-19 LAB — CULTURE, URINE COMPREHENSIVE

## 2020-07-20 DIAGNOSIS — L814 Other melanin hyperpigmentation: Secondary | ICD-10-CM | POA: Diagnosis not present

## 2020-07-21 DIAGNOSIS — D225 Melanocytic nevi of trunk: Secondary | ICD-10-CM | POA: Diagnosis not present

## 2020-08-04 ENCOUNTER — Encounter: Payer: Self-pay | Admitting: Family Medicine

## 2020-08-05 ENCOUNTER — Telehealth: Payer: Self-pay | Admitting: Family Medicine

## 2020-08-05 ENCOUNTER — Other Ambulatory Visit: Payer: Self-pay | Admitting: Nurse Practitioner

## 2020-08-05 ENCOUNTER — Ambulatory Visit: Payer: 59 | Admitting: Nurse Practitioner

## 2020-08-05 ENCOUNTER — Encounter: Payer: Self-pay | Admitting: Nurse Practitioner

## 2020-08-05 ENCOUNTER — Other Ambulatory Visit: Payer: Self-pay

## 2020-08-05 VITALS — BP 124/80 | HR 80 | Temp 98.1°F | Wt 285.0 lb

## 2020-08-05 DIAGNOSIS — R1030 Lower abdominal pain, unspecified: Secondary | ICD-10-CM | POA: Diagnosis not present

## 2020-08-05 LAB — CBC WITH DIFFERENTIAL/PLATELET
Hematocrit: 37.9 % (ref 34.0–46.6)
Hemoglobin: 12.3 g/dL (ref 11.1–15.9)
Lymphocytes Absolute: 2.7 10*3/uL (ref 0.7–3.1)
Lymphs: 34 %
MCH: 27 pg (ref 26.6–33.0)
MCHC: 32.5 g/dL (ref 31.5–35.7)
MCV: 83 fL (ref 79–97)
MID (Absolute): 0.7 10*3/uL (ref 0.1–1.6)
MID: 9 %
Neutrophils Absolute: 4.5 10*3/uL (ref 1.4–7.0)
Neutrophils: 57 %
Platelets: 247 10*3/uL (ref 150–450)
RBC: 4.55 x10E6/uL (ref 3.77–5.28)
RDW: 14.6 % (ref 11.7–15.4)
WBC: 7.9 10*3/uL (ref 3.4–10.8)

## 2020-08-05 MED ORDER — FLUCONAZOLE 150 MG PO TABS: 150.0000 mg | ORAL_TABLET | Freq: Once | ORAL | 0 refills | Status: DC

## 2020-08-05 MED ORDER — CIPROFLOXACIN HCL 500 MG PO TABS: 500.0000 mg | ORAL_TABLET | Freq: Two times a day (BID) | ORAL | 0 refills | Status: DC

## 2020-08-05 MED ORDER — METRONIDAZOLE 500 MG PO TABS: 500.0000 mg | ORAL_TABLET | Freq: Three times a day (TID) | ORAL | 0 refills | Status: DC

## 2020-08-05 NOTE — Telephone Encounter (Signed)
Pt has an appt this afternoon at 340 pm and would like to know if provider would like her to have stomach xray. Pt feels like she has a blockage. Pt works at [employer] and will get xray with order before her appt

## 2020-08-05 NOTE — Patient Instructions (Signed)
It was great to see you!  We are treating you for diverticulitis. Antibiotics were sent to the pharmacy. Take them with food. Do not drink alcohol while taking the metronidazole (flagyl). If you have any fevers, severe abdominal pain, or dizziness, go to the emergency room.  Call us or send a mychart message if your symptoms aren't improving by Monday.   Take care,  Vance Peper, NP   Diverticulitis  Diverticulitis is infection or inflammation of small pouches (diverticula) in the colon that form due to a condition called diverticulosis. Diverticula can trap stool (feces) and bacteria, causing infection and inflammation. Diverticulitis may cause severe stomach pain and diarrhea. It may lead to tissue damage in the colon that causes bleeding or blockage. The diverticula may also burst (rupture) and cause infected stool to enter other areas of the abdomen. What are the causes? This condition is caused by stool becoming trapped in the diverticula, which allows bacteria to grow in the diverticula. This leads to inflammation and infection. What increases the risk? You are more likely to develop this condition if you have diverticulosis. The risk increases if you:  Are overweight or obese.  Do not get enough exercise.  Drink alcohol.  Use tobacco products.  Eat a diet that has a lot of red meat such as beef, pork, or lamb.  Eat a diet that does not include enough fiber. High-fiber foods include fruits, vegetables, beans, nuts, and whole grains.  Are over 34 years of age. What are the signs or symptoms? Symptoms of this condition may include:  Pain and tenderness in the abdomen. The pain is normally located on the left side of the abdomen, but it may occur in other areas.  Fever and chills.  Nausea.  Vomiting.  Cramping.  Bloating.  Changes in bowel routines.  Blood in your stool. How is this diagnosed? This condition is diagnosed based on:  Your medical  history.  A physical exam.  Tests to make sure there is nothing else causing your condition. These tests may include: ? Blood tests. ? Urine tests. ? CT scan of the abdomen. How is this treated? Most cases of this condition are mild and can be treated at home. Treatment may include:  Taking over-the-counter pain medicines.  Following a clear liquid diet.  Taking antibiotic medicines by mouth.  Resting. More severe cases may need to be treated at a hospital. Treatment may include:  Not eating or drinking.  Taking prescription pain medicine.  Receiving antibiotic medicines through an IV.  Receiving fluids and nutrition through an IV.  Surgery. When your condition is under control, your health care provider may recommend that you have a colonoscopy. This is an exam to look at the entire large intestine. During the exam, a lubricated, bendable tube is inserted into the anus and then passed into the rectum, colon, and other parts of the large intestine. A colonoscopy can show how severe your diverticula are and whether something else may be causing your symptoms. Follow these instructions at home: Medicines  Take over-the-counter and prescription medicines only as told by your health care provider. These include fiber supplements, probiotics, and stool softeners.  If you were prescribed an antibiotic medicine, take it as told by your health care provider. Do not stop taking the antibiotic even if you start to feel better.  Ask your health care provider if the medicine prescribed to you requires you to avoid driving or using machinery. Eating and drinking  Follow a full  liquid diet or another diet as directed by your health care provider.  After your symptoms improve, your health care provider may tell you to change your diet. He or she may recommend that you eat a diet that contains at least 25 grams (25 g) of fiber daily. Fiber makes it easier to pass stool. Healthy sources of  fiber include: ? Berries. One cup contains 4-8 grams of fiber. ? Beans or lentils. One-half cup contains 5-8 grams of fiber. ? Green vegetables. One cup contains 4 grams of fiber.  Avoid eating red meat.   General instructions  Do not use any products that contain nicotine or tobacco, such as cigarettes, e-cigarettes, and chewing tobacco. If you need help quitting, ask your health care provider.  Exercise for at least 30 minutes, 3 times each week. You should exercise hard enough to raise your heart rate and break a sweat.  Keep all follow-up visits as told by your health care provider. This is important. You may need to have a colonoscopy. Contact a health care provider if:  Your pain does not improve.  Your bowel movements do not return to normal. Get help right away if:  Your pain gets worse.  Your symptoms do not get better with treatment.  Your symptoms suddenly get worse.  You have a fever.  You vomit more than one time.  You have stools that are bloody, black, or tarry. Summary  Diverticulitis is infection or inflammation of small pouches (diverticula) in the colon that form due to a condition called diverticulosis. Diverticula can trap stool (feces) and bacteria, causing infection and inflammation.  You are at higher risk for this condition if you have diverticulosis and you eat a diet that does not include enough fiber.  Most cases of this condition are mild and can be treated at home. More severe cases may need to be treated at a hospital.  When your condition is under control, your health care provider may recommend that you have an exam called a colonoscopy. This exam can show how severe your diverticula are and whether something else may be causing your symptoms.  Keep all follow-up visits as told by your health care provider. This is important. This information is not intended to replace advice given to you by your health care provider. Make sure you discuss  any questions you have with your health care provider. Document Revised: 03/09/2019 Document Reviewed: 03/09/2019 Elsevier Patient Education  2021 [employer].

## 2020-08-05 NOTE — Telephone Encounter (Signed)
Please advise pt is scheduled

## 2020-08-05 NOTE — Progress Notes (Signed)
Acute Office Visit  Subjective:    Patient ID: Latoya Lutz, female    DOB: Nov 23, 1970, 50 y.o.   MRN: [id number]  Chief Complaint  Patient presents with  . Abdominal Pain    Patient states she has all over abdominal pain, began about 2 weeks ago thinking it was a kidney stone. Had diarrhea earlier this week along with stomach cramps and nausea. Cramping has got worse since last night. Patient states today she had the urge to have a BM but felt constipated, since then has diarrhea again. Patient states she feels like everything is going to fall out when she urinates     HPI Patient is in today for abdominal pain. Thought it was a kidney stone at first. Saw her urologist and had an x-ray which didn't show any kidney stones. Has been avoiding fried foods and nuts. Does not notice the pain happening after eating certain foods, and happens when she doesn't eat as well.  ABDOMINAL PAIN   Duration:2 weeks Onset: sudden Severity: moderate-severe when having cramps Quality: cramping Location:  diffuse  Radiation: no Frequency: intermittent Status: fluctuating Treatments attempted: zofran, mylanta, probiotic Fever: yes--hot and cold chills, however did not check temperature  Nausea: yes Vomiting: yes several times on Tuesday Weight loss: yes 5 pounds in 1 week Decreased appetite: yes Diarrhea: yes Constipation: yes Blood in stool: no Heartburn: yes and belching Jaundice: no Rash: fever blisters around mouth Dysuria/urinary frequency: no, however pressure when urinating Hematuria: no History of sexually transmitted disease: no Recurrent NSAID use: no  Past Medical History:  Diagnosis Date  . Acid reflux   . Actinic keratosis   . Arthritis   . Atypical mole 10/27/2019   Left labia mucosa   . Candidal dermatitis 05/25/2014  . Cervical spine disease   . Chicken pox   . Chronic urethral narrowing    undergoing stretching  . Complication of anesthesia   .  Endometriosis    Dr. Rushie Chestnut at [facility], removed   . Esophageal reflux 05/25/2014  . Gross hematuria 04/25/2015  . H/O cystoscopy    normal  . Heart murmur   . Hx of basal cell carcinoma 12/29/2008   L low back  . Infection of urinary tract 09/11/2014  . Kidney stone 09/11/2014  . Kidney stones   . Microscopic hematuria 09/11/2014  . Obesity, morbid, BMI 40.0-49.9 ([place]) 04/10/2012  . PONV (postoperative nausea and vomiting)   . Right ureteral stone 04/28/2015  . Skin cancer   . Urinary retention     Past Surgical History:  Procedure Laterality Date  . ABDOMINAL HYSTERECTOMY     [facility] complete  . BLADDER SURGERY  07/2003  . CARPAL TUNNEL RELEASE Right   . CESAREAN SECTION  1998  . CHOLECYSTECTOMY    . CYSTOSCOPY W/ RETROGRADES Right 05/16/2015   Procedure: CYSTOSCOPY WITH RETROGRADE PYELOGRAM;  Surgeon: Nickie Retort, MD;  Location: [facility];  Service: Urology;  Laterality: Right;  . CYSTOSCOPY W/ URETERAL STENT PLACEMENT Right 05/16/2015   Procedure: CYSTOSCOPY WITH STENT REPLACEMENT;  Surgeon: Nickie Retort, MD;  Location: [facility];  Service: Urology;  Laterality: Right;  . CYSTOSCOPY WITH STENT PLACEMENT Right 04/29/2015   Procedure: CYSTOSCOPY WITH STENT PLACEMENT;  Surgeon: Nickie Retort, MD;  Location: [facility];  Service: Urology;  Laterality: Right;  . EXTRACORPOREAL SHOCK WAVE LITHOTRIPSY Left 06/02/2015   Procedure: EXTRACORPOREAL SHOCK WAVE LITHOTRIPSY (ESWL);  Surgeon: Hollice Espy, MD;  Location: [facility];  Service: Urology;  Laterality: Left;  . FOOT SURGERY  2003  . KNEE SURGERY  2005  . TONSILLECTOMY    . TONSILLECTOMY     lingual growth on vocal cord removal  . URETEROSCOPY WITH HOLMIUM LASER LITHOTRIPSY Right 05/16/2015   Procedure: URETEROSCOPY WITH HOLMIUM LASER LITHOTRIPSY;  Surgeon: Nickie Retort, MD;  Location: [facility];  Service: Urology;  Laterality: Right;    Family History  Problem Relation Age of Onset  . Heart disease Mother         Pacemaker and defib  . Hypertension Mother   . Hyperlipidemia Mother   . Cancer Father   . COPD Father        Lung and brain cancer  . Cancer Maternal Grandmother        Colon  . Diabetes Paternal Grandmother   . Cirrhosis Paternal Grandmother   . Cancer Paternal Grandmother   . Kidney disease Neg Hx   . Bladder Cancer Neg Hx   . Breast cancer Neg Hx     Social History   Socioeconomic History  . Marital status: Married    Spouse name: Not on file  . Number of children: Not on file  . Years of education: Not on file  . Highest education level: Not on file  Occupational History  . Not on file  Tobacco Use  . Smoking status: Former Smoker    Packs/day: 0.50    Years: 17.00    Pack years: 8.50    Types: Cigarettes    Quit date: 04/27/2008    Years since quitting: 12.2  . Smokeless tobacco: Never Used  . Tobacco comment: quit 7 years   Vaping Use  . Vaping Use: Never used  Substance and Sexual Activity  . Alcohol use: No    Alcohol/week: 0.0 standard drinks  . Drug use: No  . Sexual activity: Yes  Other Topics Concern  . Not on file  Social History Narrative   Lives in [place] with son 15YO and fiance      Work - [employer]   Social Determinants of Radio broadcast assistant Strain: Not on [employer] Insecurity: Not on file  Transportation Needs: Not on file  Physical Activity: Not on file  Stress: Not on file  Social Connections: Not on file  Intimate Partner Violence: Not on file    Outpatient Medications Prior to Visit  Medication Sig Dispense Refill  . albuterol (VENTOLIN HFA) 108 (90 Base) MCG/ACT inhaler Inhale 2 puffs into the lungs every 6 (six) hours as needed for wheezing or shortness of breath. 8 g 0  . azelastine (ASTELIN) 0.1 % nasal spray Place 1 spray into both nostrils 2 (two) times daily. 30 mL 12  . busPIRone (BUSPAR) 10 MG tablet Take 1 tablet (10 mg total) by mouth 3 (three) times daily. 90 tablet 6  . Calcipotriene-Betameth Diprop (ENSTILAR)  0.005-0.064 % FOAM Apply 1 application topically as directed. Qd up to 5 days a week aa psoriasis on elbows prn flares 60 g 2  . Calcium Carbonate (CALCIUM 600 PO) Take 1 tablet by mouth daily.     . cyclobenzaprine (FLEXERIL) 10 MG tablet Take 1 tablet (10 mg total) by mouth at bedtime. 30 tablet 2  . diclofenac Sodium (VOLTAREN) 1 % GEL     . ezetimibe (ZETIA) 10 MG tablet Take 1 tablet (10 mg total) by mouth daily. 90 tablet 3  . fexofenadine (ALLEGRA) 180 MG tablet Take 180 mg by mouth daily.    Marland Kitchen  fluticasone (FLONASE) 50 MCG/ACT nasal spray PLACE 2 SPRAYS INTO BOTH NOSTRILS DAILY. 48 g 3  . hydrochlorothiazide (HYDRODIURIL) 25 MG tablet TAKE 1 TABLET BY MOUTH DAILY. 90 tablet 0  . meloxicam (MOBIC) 15 MG tablet Take 1 tablet (15 mg total) by mouth daily. 90 tablet 3  . Nutritional Supplements (MENOPAUSE FORMULA) TABS Take by mouth. Reported on 11/04/2015    . ondansetron (ZOFRAN) 4 MG tablet Take 1 tablet (4 mg total) by mouth every 8 (eight) hours as needed for nausea or vomiting. 20 tablet 0  . pantoprazole (PROTONIX) 40 MG tablet TAKE 1 TABLET BY MOUTH DAILY. 90 tablet 0  . pravastatin (PRAVACHOL) 20 MG tablet TAKE 1 TABLET BY MOUTH DAILY 90 tablet 4  . Simethicone 180 MG CAPS Take 1 capsule (180 mg total) by mouth 3 (three) times daily with meals. 90 capsule 6  . sucralfate (CARAFATE) 1 g tablet TAKE 1 TABLET BY MOUTH 4 TIMES DAILY - WITH MEALS AND AT BEDTIME. 120 tablet 1  . vitamin E 400 UNIT capsule Take 400 Units by mouth daily.    Marland Kitchen acyclovir (ZOVIRAX) 400 MG tablet Take 1 tablet (400 mg total) by mouth 3 (three) times daily. (Patient not taking: Reported on 08/05/2020) 30 tablet 12  . pseudoephedrine (SUDAFED) 30 MG tablet Take 30 mg by mouth 3 (three) times daily. (Patient not taking: Reported on 08/05/2020)    . SAXENDA 18 MG/3ML SOPN Inject 3 mg into the skin daily. (Patient not taking: Reported on 08/05/2020)    . tamsulosin (FLOMAX) 0.4 MG CAPS capsule Take 1 capsule (0.4 mg total)  by mouth daily. (Patient not taking: Reported on 08/05/2020) 30 capsule 3  . traMADol (ULTRAM) 50 MG tablet Take 1 tablet (50 mg total) by mouth every 6 (six) hours as needed. (Patient not taking: Reported on 08/05/2020) 10 tablet 0  . UNIFINE PENTIPS 31G X 8 MM MISC  (Patient not taking: Reported on 08/05/2020)     No facility-administered medications prior to visit.    Allergies  Allergen Reactions  . Atorvastatin Other (See Comments)    Leg cramps    Review of Systems     Objective:    Physical Exam Vitals and nursing note reviewed.  Constitutional:      General: She is not in acute distress.    Appearance: She is well-developed. She is obese.  HENT:     Head: Normocephalic.  Cardiovascular:     Rate and Rhythm: Normal rate and regular rhythm.     Heart sounds: Normal heart sounds.  Pulmonary:     Effort: Pulmonary effort is normal.     Breath sounds: Normal breath sounds.  Abdominal:     General: Abdomen is protuberant. A surgical scar is present. Bowel sounds are increased. There is no distension.     Palpations: Abdomen is soft.     Tenderness: There is abdominal tenderness in the suprapubic area and left lower quadrant. There is no rebound. Negative signs include Murphy's sign.  Skin:    General: Skin is warm.  Neurological:     General: No focal deficit present.     Mental Status: She is alert and oriented to person, place, and time.  Psychiatric:        Mood and Affect: Mood normal.        Behavior: Behavior normal.     BP 124/80   Pulse 80   Temp 98.1 F (36.7 C)   Wt 285 lb (129.3 kg)  SpO2 98%   BMI 47.43 kg/m  Wt Readings from Last 3 Encounters:  08/05/20 285 lb (129.3 kg)  07/15/20 290 lb (131.5 kg)  06/24/20 290 lb (131.5 kg)    Lab Results  Component Value Date   TSH 2.150 08/10/2019   Lab Results  Component Value Date   WBC 7.9 08/05/2020   HGB 12.3 08/05/2020   HCT 37.9 08/05/2020   MCV 83 08/05/2020   PLT 247 08/05/2020   Lab  Results  Component Value Date   NA 142 10/15/2019   K 3.9 10/15/2019   CO2 25 10/15/2019   GLUCOSE 127 (H) 10/15/2019   BUN 16 10/15/2019   CREATININE 0.76 10/15/2019   BILITOT 0.2 08/10/2019   ALKPHOS 108 08/10/2019   AST 16 08/10/2019   ALT 11 08/10/2019   PROT 5.9 (L) 08/10/2019   ALBUMIN 4.0 08/10/2019   CALCIUM 9.0 10/15/2019   ANIONGAP 9 09/26/2018   GFR 82.19 06/01/2015   Lab Results  Component Value Date   CHOL 154 08/10/2019   Lab Results  Component Value Date   HDL 45 08/10/2019   Lab Results  Component Value Date   LDLCALC 87 08/10/2019   Lab Results  Component Value Date   TRIG 120 08/10/2019   Lab Results  Component Value Date   CHOLHDL 3.9 09/26/2018   Lab Results  Component Value Date   HGBA1C 5.6 08/10/2019       Assessment & Plan:   Problem List Items Addressed This Visit      Other   Lower abdominal pain - Primary    Had a prior CT scan in November 2021 which showed diverticulosis. Will treat today for diverticulitis. U/A from urologist 2 weeks ago negative. Check CBC, CMP. WBC in the office 7.9. Antibiotics sent to the pharmacy. States she normally gets a yeast infection with antibiotics, diflucan sent to the pharmacy. If not improving by Monday, will consider CT. Instructed if severe abdominal pain, fever, dizziness, or bloody stools to go to the ER. Follow-up as needed.       Relevant Orders   Comp Met (CMET)   CBC With Differential/Platelet (Completed)       Meds ordered this encounter  Medications  . ciprofloxacin (CIPRO) 500 MG tablet    Sig: Take 1 tablet (500 mg total) by mouth 2 (two) times daily.    Dispense:  20 tablet    Refill:  0  . metroNIDAZOLE (FLAGYL) 500 MG tablet    Sig: Take 1 tablet (500 mg total) by mouth 3 (three) times daily.    Dispense:  30 tablet    Refill:  0  . fluconazole (DIFLUCAN) 150 MG tablet    Sig: Take 1 tablet (150 mg total) by mouth once for 1 dose.    Dispense:  1 tablet    Refill:  0      Charyl Dancer, NP

## 2020-08-05 NOTE — Assessment & Plan Note (Addendum)
Had a prior CT scan in November 2021 which showed diverticulosis. Will treat today for diverticulitis. U/A from urologist 2 weeks ago negative. Check CBC, CMP. WBC in the office 7.9. Antibiotics sent to the pharmacy. States she normally gets a yeast infection with antibiotics, diflucan sent to the pharmacy. If not improving by Monday, will consider CT. Instructed if severe abdominal pain, fever, dizziness, or bloody stools to go to the ER. Follow-up as needed.

## 2020-08-05 NOTE — Telephone Encounter (Signed)
Based on her symptoms, I don't think it's a blockage, but we'll see what happens when she gets here.

## 2020-08-06 LAB — COMPREHENSIVE METABOLIC PANEL
ALT: 17 IU/L (ref 0–32)
AST: 16 IU/L (ref 0–40)
Albumin/Globulin Ratio: 1.9 (ref 1.2–2.2)
Albumin: 4.5 g/dL (ref 3.8–4.8)
Alkaline Phosphatase: 101 IU/L (ref 44–121)
BUN/Creatinine Ratio: 13 (ref 9–23)
BUN: 10 mg/dL (ref 6–24)
Bilirubin Total: 0.6 mg/dL (ref 0.0–1.2)
CO2: 29 mmol/L (ref 20–29)
Calcium: 9 mg/dL (ref 8.7–10.2)
Chloride: 96 mmol/L (ref 96–106)
Creatinine, Ser: 0.77 mg/dL (ref 0.57–1.00)
GFR calc Af Amer: 104 mL/min/{1.73_m2} (ref >59)
GFR calc non Af Amer: 90 mL/min/{1.73_m2} (ref >59)
Globulin, Total: 2.4 g/dL (ref 1.5–4.5)
Glucose: 103 mg/dL — ABNORMAL HIGH (ref 65–99)
Potassium: 3.5 mmol/L (ref 3.5–5.2)
Sodium: 140 mmol/L (ref 134–144)
Total Protein: 6.9 g/dL (ref 6.0–8.5)

## 2020-08-15 ENCOUNTER — Other Ambulatory Visit: Payer: Self-pay | Admitting: Family Medicine

## 2020-08-15 NOTE — Progress Notes (Signed)
08/16/2020 4:24 PM   [place] [birth year] [id number]  Referring provider: Valerie Roys, DO [place],  [place] [id number]  Chief Complaint  Patient presents with  . Flank Pain   Urological history: 1. Urethral stricture - diagnosed by Dr. Madelin Headings with urethral dilation and had been receiving serial dilations every 4 to 6 months since 2013 - managed with urethral dilation q 6 months at this time  2. Nephrolithiasis - right ureteral stones treated with ESWL and right URS/LL/right ureteral stent placement and stent removal in winter 2016 - CT Renal stone study performed on 04/05/2017 noted no nephroureterolithiasis. No hydronephrosis  HPI: Latoya Lutz is a 50 y.o. female who presents today for urethral dilation.    She was seen previously for abdominal pain that turned out to be a case of diverticulitis rather than renal colic.  She is currently on medication for the diverticulitis and has a follow up with Dr. Wynetta Emery.    She is feeling better at this time.    Her UA is negative.     PMH: Past Medical History:  Diagnosis Date  . Acid reflux   . Actinic keratosis   . Arthritis   . Atypical mole 10/27/2019   Left labia mucosa   . Candidal dermatitis 05/25/2014  . Cervical spine disease   . Chicken pox   . Chronic urethral narrowing    undergoing stretching  . Complication of anesthesia   . Endometriosis    Dr. Rushie Chestnut at [facility], removed   . Esophageal reflux 05/25/2014  . Gross hematuria 04/25/2015  . H/O cystoscopy    normal  . Heart murmur   . Hx of basal cell carcinoma 12/29/2008   L low back  . Infection of urinary tract 09/11/2014  . Kidney stone 09/11/2014  . Kidney stones   . Microscopic hematuria 09/11/2014  . Obesity, morbid, BMI 40.0-49.9 ([place]) 04/10/2012  . PONV (postoperative nausea and vomiting)   . Right ureteral stone 04/28/2015  . Skin cancer   . Urinary retention     Surgical History: Past Surgical History:  Procedure  Laterality Date  . ABDOMINAL HYSTERECTOMY     [facility] complete  . BLADDER SURGERY  07/2003  . CARPAL TUNNEL RELEASE Right   . CESAREAN SECTION  1998  . CHOLECYSTECTOMY    . CYSTOSCOPY W/ RETROGRADES Right 05/16/2015   Procedure: CYSTOSCOPY WITH RETROGRADE PYELOGRAM;  Surgeon: Nickie Retort, MD;  Location: [facility];  Service: Urology;  Laterality: Right;  . CYSTOSCOPY W/ URETERAL STENT PLACEMENT Right 05/16/2015   Procedure: CYSTOSCOPY WITH STENT REPLACEMENT;  Surgeon: Nickie Retort, MD;  Location: [facility];  Service: Urology;  Laterality: Right;  . CYSTOSCOPY WITH STENT PLACEMENT Right 04/29/2015   Procedure: CYSTOSCOPY WITH STENT PLACEMENT;  Surgeon: Nickie Retort, MD;  Location: [facility];  Service: Urology;  Laterality: Right;  . EXTRACORPOREAL SHOCK WAVE LITHOTRIPSY Left 06/02/2015   Procedure: EXTRACORPOREAL SHOCK WAVE LITHOTRIPSY (ESWL);  Surgeon: Hollice Espy, MD;  Location: [facility];  Service: Urology;  Laterality: Left;  . FOOT SURGERY  2003  . KNEE SURGERY  2005  . TONSILLECTOMY    . TONSILLECTOMY     lingual growth on vocal cord removal  . URETEROSCOPY WITH HOLMIUM LASER LITHOTRIPSY Right 05/16/2015   Procedure: URETEROSCOPY WITH HOLMIUM LASER LITHOTRIPSY;  Surgeon: Nickie Retort, MD;  Location: [facility];  Service: Urology;  Laterality: Right;    Home Medications:  Allergies as of 08/16/2020  Reactions   Atorvastatin Other (See Comments)   Leg cramps      Medication List       Accurate as of August 16, 2020  4:24 PM. If you have any questions, ask your nurse or doctor.        acyclovir 400 MG tablet Commonly known as: ZOVIRAX Take 1 tablet (400 mg total) by mouth 3 (three) times daily.   albuterol 108 (90 Base) MCG/ACT inhaler Commonly known as: VENTOLIN HFA Inhale 2 puffs into the lungs every 6 (six) hours as needed for wheezing or shortness of breath.   azelastine 0.1 % nasal spray Commonly known as: ASTELIN Place 1 spray into both nostrils 2  (two) times daily.   busPIRone 10 MG tablet Commonly known as: BUSPAR Take 1 tablet (10 mg total) by mouth 3 (three) times daily.   CALCIUM 600 PO Take 1 tablet by mouth daily.   ciprofloxacin 500 MG tablet Commonly known as: Cipro Take 1 tablet (500 mg total) by mouth 2 (two) times daily.   cyclobenzaprine 10 MG tablet Commonly known as: FLEXERIL Take 1 tablet (10 mg total) by mouth at bedtime.   diclofenac Sodium 1 % Gel Commonly known as: VOLTAREN   Enstilar 0.005-0.064 % Foam Generic drug: Calcipotriene-Betameth Diprop Apply 1 application topically as directed. 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   fexofenadine 180 MG tablet Commonly known as: ALLEGRA Take 180 mg by mouth daily.   fluticasone 50 MCG/ACT nasal spray Commonly known as: FLONASE PLACE 2 SPRAYS INTO BOTH NOSTRILS DAILY.   hydrochlorothiazide 25 MG tablet Commonly known as: HYDRODIURIL TAKE 1 TABLET BY MOUTH DAILY.   meloxicam 15 MG tablet Commonly known as: MOBIC TAKE 1 TABLET (15 MG TOTAL) BY MOUTH DAILY.   Menopause Formula Tabs Take by mouth. Reported on 11/04/2015   metroNIDAZOLE 500 MG tablet Commonly known as: Flagyl Take 1 tablet (500 mg total) by mouth 3 (three) times daily.   ondansetron 4 MG tablet Commonly known as: ZOFRAN Take 1 tablet (4 mg total) by mouth every 8 (eight) hours as needed for nausea or vomiting.   pantoprazole 40 MG tablet Commonly known as: PROTONIX TAKE 1 TABLET BY MOUTH DAILY.   pravastatin 20 MG tablet Commonly known as: PRAVACHOL TAKE 1 TABLET BY MOUTH DAILY   pseudoephedrine 30 MG tablet Commonly known as: SUDAFED Take 30 mg by mouth 3 (three) times daily.   Saxenda 18 MG/3ML Sopn Generic drug: Liraglutide -Weight Management Inject 3 mg into the skin daily.   Simethicone 180 MG Caps Take 1 capsule (180 mg total) by mouth 3 (three)  times daily with meals.   sucralfate 1 g tablet Commonly known as: CARAFATE TAKE 1 TABLET BY MOUTH 4 TIMES DAILY - WITH MEALS AND AT BEDTIME.   tamsulosin 0.4 MG Caps capsule Commonly known as: FLOMAX Take 1 capsule (0.4 mg total) by mouth daily.   traMADol 50 MG tablet Commonly known as: ULTRAM Take 1 tablet (50 mg total) by mouth every 6 (six) hours as needed.   Unifine Pentips 31G X 8 MM Misc Generic drug: Insulin Pen Needle   vitamin E 180 MG (400 UNITS) capsule Take 400 Units by mouth daily.       Allergies:  Allergies  Allergen Reactions  . Atorvastatin Other (See Comments)    Leg cramps  Family History: Family History  Problem Relation Age of Onset  . Heart disease Mother        Pacemaker and defib  . Hypertension Mother   . Hyperlipidemia Mother   . Cancer Father   . COPD Father        Lung and brain cancer  . Cancer Maternal Grandmother        Colon  . Diabetes Paternal Grandmother   . Cirrhosis Paternal Grandmother   . Cancer Paternal Grandmother   . Kidney disease Neg Hx   . Bladder Cancer Neg Hx   . Breast cancer Neg Hx     Social History:  reports that she quit smoking about 12 years ago. Her smoking use included cigarettes. She has a 8.50 pack-year smoking history. She has never used smokeless tobacco. She reports that she does not drink alcohol and does not use drugs.  ROS: Pertinent ROS in HPI  Physical Exam: BP (!) 168/94   Pulse 70   Ht 5\' 5"  (1.651 m)   Wt 280 lb (127 kg)   BMI 46.59 kg/m   Constitutional:  Well nourished. Alert and oriented, No acute distress. HEENT: [place] AT, mask in place.  Trachea midline Cardiovascular: No clubbing, cyanosis, or edema. Respiratory: Normal respiratory effort, no increased work of breathing. GU: No CVA tenderness.  No bladder fullness or masses.  Normal external genitalia, normal pubic hair distribution, no lesions.  Normal urethral meatus, no lesions, no prolapse, no discharge.   No urethral  masses, tenderness and/or tenderness. No bladder fullness, tenderness or masses. Normal vagina mucosa, good estrogen effect, no discharge, no lesions, fair pelvic support, grade I cystocele and no rectocele noted.  Anus and perineum are without rashes or lesions.    Neurologic: Grossly intact, no focal deficits, moving all 4 extremities. Psychiatric: Normal mood and affect.   Laboratory Data: Urinalysis Component     Latest Ref Rng & Units 08/16/2020  Specific Gravity, UA     1.005 - 1.030 1.010  pH, UA     5.0 - 7.5 5.5  Color, UA     Yellow Yellow  Appearance Ur     Clear Clear  Leukocytes,UA     Negative Negative  Protein,UA     Negative/Trace Negative  Glucose, UA     Negative Negative  Ketones, UA     Negative Negative  RBC, UA     Negative Negative  Bilirubin, UA     Negative Negative  Urobilinogen, Ur     0.2 - 1.0 mg/dL 0.2  Nitrite, UA     Negative Negative  Microscopic Examination      See below:   Component     Latest Ref Rng & Units 08/16/2020  WBC, UA     0 - 5 /hpf 0-5  RBC     0 - 2 /hpf 0-2  Epithelial Cells (non renal)     0 - 10 /hpf 0-10  Bacteria, UA     None seen/Few None seen   I have reviewed the labs.   Pertinent Imaging: No imaging since last visit  Assessment & Plan:    1. Urethral stricture - Patient is placed in stirrups and her urethral meatus and vulva are cleansed with Betadine.  2% Lidocaine jelly was inserted into her urethra.  I then dilated her with Elby Showers sounds to a 26fr without difficultly.  She tolerated the procedure well.  She will return in 6 months.  Return in about 6 months (  around 02/16/2021) for urethral dilation .  These notes generated with voice recognition software. I apologize for typographical errors.  Zara Council, PA-C  [place] [address]  [place], [place] [address] [phone]  I spent 10 minutes on the day of the encounter to include pre-visit record  review, face-to-face time with the patient, and post-visit ordering of tests.

## 2020-08-15 NOTE — Telephone Encounter (Signed)
Requested medication (s) are due for refill today - yes- expired rx  Requested medication (s) are on the active medication list - yes  Future visit scheduled -no  Last refill: 08/10/19 #90 3 RF  Notes to clinic: Patient passes protocol- but Rx expired- requesting RF  Requested Prescriptions  Pending Prescriptions Disp Refills   meloxicam (MOBIC) 15 MG tablet [Pharmacy Med Name: MELOXICAM 15 MG TABLET 15 Tablet] 90 tablet 3    Sig: Take 1 tablet (15 mg total) by mouth daily.      Analgesics:  COX2 Inhibitors Passed - 08/15/2020  8:00 AM      Passed - HGB in normal range and within 360 days    Hemoglobin  Date Value Ref Range Status  08/05/2020 12.3 11.1 - 15.9 g/dL Final          Passed - Cr in normal range and within 360 days    Creatinine  Date Value Ref Range Status  04/15/2012 0.79 0.60 - 1.30 mg/dL Final   Creatinine, Ser  Date Value Ref Range Status  08/05/2020 0.77 0.57 - 1.00 mg/dL Final    Comment:                   **Effective August 08, 2020 [facility] will begin**                  reporting the 2021 CKD-EPI creatinine equation that                  estimates kidney function without a race variable.    Creatinine,U  Date Value Ref Range Status  06/01/2015 93.7 mg/dL Final          Passed - Patient is not pregnant      Passed - Valid encounter within last 12 months    Recent Outpatient Visits           1 week ago Lower abdominal pain   [place], Lauren A, NP   2 months ago COVID-19   [facility], Megan P, DO   7 months ago Non-recurrent acute allergic otitis media of left ear   [facility] [place], Henrine Screws T, NP   7 months ago Left ear pain   [facility] [place], Megan P, DO   10 months ago Essential hypertension   [place], Barb Merino, DO       Future Appointments             Tomorrow Nori Riis, PA-C [place]                  Requested Prescriptions  Pending Prescriptions Disp Refills   meloxicam (MOBIC) 15 MG tablet [Pharmacy Med Name: MELOXICAM 15 MG TABLET 15 Tablet] 90 tablet 3    Sig: Take 1 tablet (15 mg total) by mouth daily.      Analgesics:  COX2 Inhibitors Passed - 08/15/2020  8:00 AM      Passed - HGB in normal range and within 360 days    Hemoglobin  Date Value Ref Range Status  08/05/2020 12.3 11.1 - 15.9 g/dL Final          Passed - Cr in normal range and within 360 days    Creatinine  Date Value Ref Range Status  04/15/2012 0.79 0.60 - 1.30 mg/dL Final   Creatinine, Ser  Date Value Ref Range Status  08/05/2020 0.77 0.57 - 1.00 mg/dL Final  Comment:                   **Effective August 08, 2020 [facility] will begin**                  reporting the 2021 CKD-EPI creatinine equation that                  estimates kidney function without a race variable.    Creatinine,U  Date Value Ref Range Status  06/01/2015 93.7 mg/dL Final          Passed - Patient is not pregnant      Passed - Valid encounter within last 12 months    Recent Outpatient Visits           1 week ago Lower abdominal pain   [place], Lauren A, NP   2 months ago COVID-19   [employer] [facility], [place], DO   7 months ago Non-recurrent acute allergic otitis media of left ear   [place], Barbaraann Faster, NP   7 months ago Left ear pain   [facility] [place], Megan P, DO   10 months ago Essential hypertension   [place], Barb Merino, DO       Future Appointments             Tomorrow [place], [place], [place]

## 2020-08-16 ENCOUNTER — Other Ambulatory Visit: Payer: Self-pay | Admitting: Family Medicine

## 2020-08-16 ENCOUNTER — Other Ambulatory Visit: Payer: Self-pay

## 2020-08-16 ENCOUNTER — Ambulatory Visit (INDEPENDENT_AMBULATORY_CARE_PROVIDER_SITE_OTHER): Payer: 59 | Admitting: Urology

## 2020-08-16 ENCOUNTER — Encounter: Payer: Self-pay | Admitting: Urology

## 2020-08-16 VITALS — BP 168/94 | HR 70 | Ht 65.0 in | Wt 280.0 lb

## 2020-08-16 DIAGNOSIS — R109 Unspecified abdominal pain: Secondary | ICD-10-CM

## 2020-08-16 DIAGNOSIS — N3592 Unspecified urethral stricture, female: Secondary | ICD-10-CM | POA: Diagnosis not present

## 2020-08-18 LAB — URINALYSIS, COMPLETE
Bilirubin, UA: NEGATIVE
Glucose, UA: NEGATIVE
Ketones, UA: NEGATIVE
Leukocytes,UA: NEGATIVE
Nitrite, UA: NEGATIVE
Protein,UA: NEGATIVE
RBC, UA: NEGATIVE
Specific Gravity, UA: 1.01 (ref 1.005–1.030)
Urobilinogen, Ur: 0.2 mg/dL (ref 0.2–1.0)
pH, UA: 5.5 (ref 5.0–7.5)

## 2020-08-18 LAB — MICROSCOPIC EXAMINATION: Bacteria, UA: NONE SEEN

## 2020-09-12 ENCOUNTER — Other Ambulatory Visit: Payer: Self-pay

## 2020-09-12 ENCOUNTER — Other Ambulatory Visit: Payer: Self-pay | Admitting: Family Medicine

## 2020-09-12 DIAGNOSIS — K219 Gastro-esophageal reflux disease without esophagitis: Secondary | ICD-10-CM

## 2020-09-12 MED ORDER — PRAVASTATIN SODIUM 20 MG PO TABS
ORAL_TABLET | ORAL | 0 refills | Status: DC
  Filled 2020-09-12: qty 30, 30d supply, fill #0

## 2020-09-12 MED ORDER — PANTOPRAZOLE SODIUM 40 MG PO TBEC
DELAYED_RELEASE_TABLET | Freq: Every day | ORAL | 0 refills | Status: DC
  Filled 2020-09-12 (×2): qty 90, 90d supply, fill #0

## 2020-09-12 MED ORDER — HYDROCHLOROTHIAZIDE 25 MG PO TABS
ORAL_TABLET | Freq: Every day | ORAL | 0 refills | Status: DC
  Filled 2020-09-12: qty 90, 90d supply, fill #0

## 2020-09-12 MED FILL — Ezetimibe Tab 10 MG: ORAL | 90 days supply | Qty: 90 | Fill #0 | Status: AC

## 2020-10-13 ENCOUNTER — Encounter: Payer: 59 | Admitting: Family Medicine

## 2020-10-31 ENCOUNTER — Encounter: Payer: Self-pay | Admitting: Family Medicine

## 2020-11-01 ENCOUNTER — Encounter: Payer: Self-pay | Admitting: Family Medicine

## 2020-11-01 ENCOUNTER — Other Ambulatory Visit: Payer: Self-pay

## 2020-11-01 ENCOUNTER — Ambulatory Visit (INDEPENDENT_AMBULATORY_CARE_PROVIDER_SITE_OTHER): Payer: 59 | Admitting: Family Medicine

## 2020-11-01 VITALS — BP 128/79 | HR 62 | Temp 98.2°F | Ht 64.5 in | Wt 289.6 lb

## 2020-11-01 DIAGNOSIS — Z0000 Encounter for general adult medical examination without abnormal findings: Secondary | ICD-10-CM | POA: Diagnosis not present

## 2020-11-01 DIAGNOSIS — I10 Essential (primary) hypertension: Secondary | ICD-10-CM

## 2020-11-01 DIAGNOSIS — M79604 Pain in right leg: Secondary | ICD-10-CM

## 2020-11-01 DIAGNOSIS — K219 Gastro-esophageal reflux disease without esophagitis: Secondary | ICD-10-CM | POA: Diagnosis not present

## 2020-11-01 DIAGNOSIS — F419 Anxiety disorder, unspecified: Secondary | ICD-10-CM | POA: Diagnosis not present

## 2020-11-01 DIAGNOSIS — Z1211 Encounter for screening for malignant neoplasm of colon: Secondary | ICD-10-CM | POA: Diagnosis not present

## 2020-11-01 DIAGNOSIS — E782 Mixed hyperlipidemia: Secondary | ICD-10-CM

## 2020-11-01 DIAGNOSIS — Z1231 Encounter for screening mammogram for malignant neoplasm of breast: Secondary | ICD-10-CM

## 2020-11-01 MED ORDER — PANTOPRAZOLE SODIUM 40 MG PO TBEC
DELAYED_RELEASE_TABLET | Freq: Every day | ORAL | 1 refills | Status: DC
  Filled 2020-11-01 – 2021-01-01 (×2): qty 90, 90d supply, fill #0

## 2020-11-01 MED ORDER — FLUTICASONE PROPIONATE 50 MCG/ACT NA SUSP
2.0000 | Freq: Every day | NASAL | 3 refills | Status: DC
  Filled 2020-11-01: qty 48, 90d supply, fill #0
  Filled 2021-05-15: qty 48, 90d supply, fill #1
  Filled 2021-07-30: qty 48, 90d supply, fill #2

## 2020-11-01 MED ORDER — PRAVASTATIN SODIUM 20 MG PO TABS
ORAL_TABLET | ORAL | 1 refills | Status: DC
  Filled 2020-11-01: qty 90, 90d supply, fill #0

## 2020-11-01 MED ORDER — CYCLOBENZAPRINE HCL 10 MG PO TABS
10.0000 mg | ORAL_TABLET | Freq: Every day | ORAL | 2 refills | Status: DC
  Filled 2020-11-01: qty 30, 30d supply, fill #0

## 2020-11-01 MED ORDER — ACYCLOVIR 400 MG PO TABS
400.0000 mg | ORAL_TABLET | Freq: Three times a day (TID) | ORAL | 12 refills | Status: DC
  Filled 2020-11-01: qty 30, 10d supply, fill #0
  Filled 2021-02-26: qty 30, 10d supply, fill #1
  Filled 2021-05-15: qty 30, 10d supply, fill #2
  Filled 2021-09-17: qty 30, 10d supply, fill #3

## 2020-11-01 MED ORDER — MELOXICAM 15 MG PO TABS
ORAL_TABLET | Freq: Every day | ORAL | 3 refills | Status: DC
  Filled 2020-11-01: qty 90, 90d supply, fill #0
  Filled 2021-05-15: qty 90, 90d supply, fill #1

## 2020-11-01 MED ORDER — HYDROCHLOROTHIAZIDE 25 MG PO TABS
ORAL_TABLET | Freq: Every day | ORAL | 1 refills | Status: DC
  Filled 2020-11-01 – 2020-12-11 (×2): qty 90, 90d supply, fill #0
  Filled 2021-03-12 – 2021-03-13 (×2): qty 90, 90d supply, fill #1

## 2020-11-01 MED ORDER — ALBUTEROL SULFATE HFA 108 (90 BASE) MCG/ACT IN AERS
INHALATION_SPRAY | RESPIRATORY_TRACT | 3 refills | Status: DC
  Filled 2020-11-01: qty 18, 25d supply, fill #0

## 2020-11-01 NOTE — Assessment & Plan Note (Signed)
Stable. Does not need refill today. Call with any concerns. Continue to monitor.

## 2020-11-01 NOTE — Assessment & Plan Note (Signed)
Under good control on current regimen. Continue current regimen. Continue to monitor. Call with any concerns. Refills given. Labs drawn today.   

## 2020-11-01 NOTE — Patient Instructions (Addendum)
Call to schedule your mammogram: [facility]  Address: [address], [place], [place] [address]  Phone: [phone]   Health Maintenance, Female Adopting a healthy lifestyle and getting preventive care are important in promoting health and wellness. Ask your health care provider about:  The right schedule for you to have regular tests and exams.  Things you can do on your own to prevent diseases and keep yourself healthy. What should I know about diet, weight, and exercise? Eat a healthy diet  Eat a diet that includes plenty of vegetables, fruits, low-fat dairy products, and lean protein.  Do not eat a lot of foods that are high in solid fats, added sugars, or sodium.   Maintain a healthy weight Body mass index (BMI) is used to identify weight problems. It estimates body fat based on height and weight. Your health care provider can help determine your BMI and help you achieve or maintain a healthy weight. Get regular exercise Get regular exercise. This is one of the most important things you can do for your health. Most adults should:  Exercise for at least 150 minutes each week. The exercise should increase your heart rate and make you sweat (moderate-intensity exercise).  Do strengthening exercises at least twice a week. This is in addition to the moderate-intensity exercise.  Spend less time sitting. Even light physical activity can be beneficial. Watch cholesterol and blood lipids Have your blood tested for lipids and cholesterol at 51 years of age, then have this test every 5 years. Have your cholesterol levels checked more often if:  Your lipid or cholesterol levels are high.  You are older than 51 years of age.  You are at high risk for heart disease. What should I know about cancer screening? Depending on your health history and family history, you may need to have cancer screening at various ages. This may include screening  for:  Breast cancer.  Cervical cancer.  Colorectal cancer.  Skin cancer.  Lung cancer. What should I know about heart disease, diabetes, and high blood pressure? Blood pressure and heart disease  High blood pressure causes heart disease and increases the risk of stroke. This is more likely to develop in people who have high blood pressure readings, are of African descent, or are overweight.  Have your blood pressure checked: ? Every 3-5 years if you are 18-39 years of age. ? Every year if you are 40 years old or older. Diabetes Have regular diabetes screenings. This checks your fasting blood sugar level. Have the screening done:  Once every three years after age 40 if you are at a normal weight and have a low risk for diabetes.  More often and at a younger age if you are overweight or have a high risk for diabetes. What should I know about preventing infection? Hepatitis B If you have a higher risk for hepatitis B, you should be screened for this virus. Talk with your health care provider to find out if you are at risk for hepatitis B infection. Hepatitis C Testing is recommended for:  Everyone born from 1945 through 1965.  Anyone with known risk factors for hepatitis C. Sexually transmitted infections (STIs)  Get screened for STIs, including gonorrhea and chlamydia, if: ? You are sexually active and are younger than 51 years of age. ? You are older than 51 years of age and your health care provider tells you that you are at risk for this type of   infection. ? Your sexual activity has changed since you were last screened, and you are at increased risk for chlamydia or gonorrhea. Ask your health care provider if you are at risk.  Ask your health care provider about whether you are at high risk for HIV. Your health care provider may recommend a prescription medicine to help prevent HIV infection. If you choose to take medicine to prevent HIV, you should first get tested for HIV.  You should then be tested every 3 months for as long as you are taking the medicine. Pregnancy  If you are about to stop having your period (premenopausal) and you may become pregnant, seek counseling before you get pregnant.  Take 400 to 800 micrograms (mcg) of folic acid every day if you become pregnant.  Ask for birth control (contraception) if you want to prevent pregnancy. Osteoporosis and menopause Osteoporosis is a disease in which the bones lose minerals and strength with aging. This can result in bone fractures. If you are 65 years old or older, or if you are at risk for osteoporosis and fractures, ask your health care provider if you should:  Be screened for bone loss.  Take a calcium or vitamin D supplement to lower your risk of fractures.  Be given hormone replacement therapy (HRT) to treat symptoms of menopause. Follow these instructions at home: Lifestyle  Do not use any products that contain nicotine or tobacco, such as cigarettes, e-cigarettes, and chewing tobacco. If you need help quitting, ask your health care provider.  Do not use street drugs.  Do not share needles.  Ask your health care provider for help if you need support or information about quitting drugs. Alcohol use  Do not drink alcohol if: ? Your health care provider tells you not to drink. ? You are pregnant, may be pregnant, or are planning to become pregnant.  If you drink alcohol: ? Limit how much you use to 0-1 drink a day. ? Limit intake if you are breastfeeding.  Be aware of how much alcohol is in your drink. In the [place] one drink equals one 12 oz bottle of beer (355 mL), one 5 oz glass of wine (148 mL), or one 1 oz glass of hard liquor (44 mL). General instructions  Schedule regular health, dental, and eye exams.  Stay current with your vaccines.  Tell your health care provider if: ? You often feel depressed. ? You have ever been abused or do not feel safe at  home. Summary  Adopting a healthy lifestyle and getting preventive care are important in promoting health and wellness.  Follow your health care provider's instructions about healthy diet, exercising, and getting tested or screened for diseases.  Follow your health care provider's instructions on monitoring your cholesterol and blood pressure. This information is not intended to replace advice given to you by your health care provider. Make sure you discuss any questions you have with your health care provider. Document Revised: 05/21/2018 Document Reviewed: 05/21/2018 Elsevier Patient Education  2021 [employer]  

## 2020-11-01 NOTE — Progress Notes (Signed)
BP 128/79   Pulse 62   Temp 98.2 F (36.8 C)   Ht 5' 4.5" (1.638 m)   Wt 289 lb 9.6 oz (131.4 kg)   SpO2 98%   BMI 48.94 kg/m    Subjective:    Patient ID: Latoya Lutz, female    DOB: 1970-07-23, 50 y.o.   MRN: [id number]  HPI: Latoya Lutz is a 50 y.o. female presenting on 11/01/2020 for comprehensive medical examination. Current medical complaints include:  Slipped and fell on the side of the pool and hurt her R leg. Has been putting ice on it and it's feeling better.  HYPERTENSION / HYPERLIPIDEMIA Satisfied with current treatment? yes Duration of hypertension: chronic BP monitoring frequency: not checking BP medication side effects: no Past BP meds: HCTZ Duration of hyperlipidemia: chronic Cholesterol medication side effects: no Cholesterol supplements: none Past cholesterol medications: pravastatin Medication compliance: excellent compliance Aspirin: no Recent stressors: no Recurrent headaches: no Visual changes: no Palpitations: no Dyspnea: no Chest pain: no Lower extremity edema: no Dizzy/lightheaded: no  She currently lives with: husband and kids Menopausal Symptoms: no  Depression Screen done today and results listed below:  Depression screen [facility] 2/9 11/01/2020 10/15/2019 09/29/2018 08/12/2017 08/08/2016  Decreased Interest 0 0 0 0 0  Down, Depressed, Hopeless 0 0 0 0 0  PHQ - 2 Score 0 0 0 0 0  Altered sleeping - - 0 0 -  Tired, decreased energy - - 0 0 -  Change in appetite - - 0 0 -  Feeling bad or failure about yourself  - - 0 0 -  Trouble concentrating - - 0 0 -  Moving slowly or fidgety/restless - - 0 0 -  Suicidal thoughts - - 0 0 -  PHQ-9 Score - - 0 0 -  Difficult doing work/chores - - - Not difficult at all -     Past Medical History:  Past Medical History:  Diagnosis Date  . Acid reflux   . Actinic keratosis   . Arthritis   . Atypical mole 10/27/2019   Left labia mucosa   . Candidal dermatitis 05/25/2014  . Cervical  spine disease   . Chicken pox   . Chronic urethral narrowing    undergoing stretching  . Complication of anesthesia   . Endometriosis    Dr. Rushie Chestnut at [facility], removed   . Esophageal reflux 05/25/2014  . Gross hematuria 04/25/2015  . H/O cystoscopy    normal  . Heart murmur   . Hx of basal cell carcinoma 12/29/2008   L low back  . Infection of urinary tract 09/11/2014  . Kidney stone 09/11/2014  . Kidney stones   . Microscopic hematuria 09/11/2014  . Obesity, morbid, BMI 40.0-49.9 ([place]) 04/10/2012  . PONV (postoperative nausea and vomiting)   . Right ureteral stone 04/28/2015  . Skin cancer   . Urinary retention     Surgical History:  Past Surgical History:  Procedure Laterality Date  . ABDOMINAL HYSTERECTOMY     [facility] complete  . BLADDER SURGERY  07/2003  . CARPAL TUNNEL RELEASE Right   . CESAREAN SECTION  1998  . CHOLECYSTECTOMY    . CYSTOSCOPY W/ RETROGRADES Right 05/16/2015   Procedure: CYSTOSCOPY WITH RETROGRADE PYELOGRAM;  Surgeon: Nickie Retort, MD;  Location: [facility];  Service: Urology;  Laterality: Right;  . CYSTOSCOPY W/ URETERAL STENT PLACEMENT Right 05/16/2015   Procedure: CYSTOSCOPY WITH STENT REPLACEMENT;  Surgeon: Nickie Retort, MD;  Location: [facility];  Service: Urology;  Laterality: Right;  . CYSTOSCOPY WITH STENT PLACEMENT Right 04/29/2015   Procedure: CYSTOSCOPY WITH STENT PLACEMENT;  Surgeon: Nickie Retort, MD;  Location: [facility];  Service: Urology;  Laterality: Right;  . EXTRACORPOREAL SHOCK WAVE LITHOTRIPSY Left 06/02/2015   Procedure: EXTRACORPOREAL SHOCK WAVE LITHOTRIPSY (ESWL);  Surgeon: Hollice Espy, MD;  Location: [facility];  Service: Urology;  Laterality: Left;  . FOOT SURGERY  2003  . KNEE SURGERY  2005  . TONSILLECTOMY    . TONSILLECTOMY     lingual growth on vocal cord removal  . URETEROSCOPY WITH HOLMIUM LASER LITHOTRIPSY Right 05/16/2015   Procedure: URETEROSCOPY WITH HOLMIUM LASER LITHOTRIPSY;  Surgeon: Nickie Retort, MD;   Location: [facility];  Service: Urology;  Laterality: Right;    Medications:  Current Outpatient Medications on File Prior to Visit  Medication Sig  . azelastine (ASTELIN) 0.1 % nasal spray PLACE 1 SPRAY INTO BOTH NOSTRILS 2 TIMES DAILY.  . Calcium Carbonate (CALCIUM 600 PO) Take 1 tablet by mouth daily.   . diclofenac Sodium (VOLTAREN) 1 % GEL APPLY 2 GRAMS TO THE AFFECTED AREA(S) 4 TIMES A DAY  . ezetimibe (ZETIA) 10 MG tablet TAKE 1 TABLET (10 MG TOTAL) BY MOUTH DAILY.  . fexofenadine (ALLEGRA) 180 MG tablet Take 180 mg by mouth daily.  . Nutritional Supplements (MENOPAUSE FORMULA) TABS Take by mouth. Reported on 11/04/2015  . ondansetron (ZOFRAN) 4 MG tablet TAKE 1 TABLET (4 MG TOTAL) BY MOUTH EVERY 8 (EIGHT) HOURS AS NEEDED FOR NAUSEA OR VOMITING.  . pseudoephedrine (SUDAFED) 30 MG tablet Take 30 mg by mouth 3 (three) times daily.  . Simethicone 180 MG CAPS Take 1 capsule (180 mg total) by mouth 3 (three) times daily with meals.  . sucralfate (CARAFATE) 1 g tablet TAKE 1 TABLET BY MOUTH 4 TIMES DAILY - WITH MEALS AND AT BEDTIME.  . tamsulosin (FLOMAX) 0.4 MG CAPS capsule TAKE 1 CAPSULE (0.4 MG TOTAL) BY MOUTH DAILY.  Marland Kitchen UNIFINE PENTIPS 31G X 8 MM MISC   . vitamin E 400 UNIT capsule Take 400 Units by mouth daily.  . busPIRone (BUSPAR) 10 MG tablet Take 1 tablet (10 mg total) by mouth 3 (three) times daily. (Patient not taking: Reported on 11/01/2020)  . SAXENDA 18 MG/3ML SOPN Inject 3 mg into the skin daily. (Patient not taking: Reported on 11/01/2020)   No current facility-administered medications on file prior to visit.    Allergies:  Allergies  Allergen Reactions  . Atorvastatin Other (See Comments)    Leg cramps    Social History:  Social History   Socioeconomic History  . Marital status: Married    Spouse name: Not on file  . Number of children: Not on file  . Years of education: Not on file  . Highest education level: Not on file  Occupational History  . Not on file   Tobacco Use  . Smoking status: Former Smoker    Packs/day: 0.50    Years: 17.00    Pack years: 8.50    Types: Cigarettes    Quit date: 04/27/2008    Years since quitting: 12.5  . Smokeless tobacco: Never Used  . Tobacco comment: quit 7 years   Vaping Use  . Vaping Use: Never used  Substance and Sexual Activity  . Alcohol use: No    Alcohol/week: 0.0 standard drinks  . Drug use: No  . Sexual activity: Yes  Other Topics Concern  . Not on file  Social History Narrative  Lives in [place] with son 15YO and fiance      Work - [employer]   Social Determinants of Radio broadcast assistant Strain: Not on [employer] Insecurity: Not on file  Transportation Needs: Not on file  Physical Activity: Not on file  Stress: Not on file  Social Connections: Not on file  Intimate Partner Violence: Not on file   Social History   Tobacco Use  Smoking Status Former Smoker  . Packs/day: 0.50  . Years: 17.00  . Pack years: 8.50  . Types: Cigarettes  . Quit date: 04/27/2008  . Years since quitting: 12.5  Smokeless Tobacco Never Used  Tobacco Comment   quit 7 years    Social History   Substance and Sexual Activity  Alcohol Use No  . Alcohol/week: 0.0 standard drinks    Family History:  Family History  Problem Relation Age of Onset  . Heart disease Mother        Pacemaker and defib  . Hypertension Mother   . Hyperlipidemia Mother   . Cancer Father   . COPD Father        Lung and brain cancer  . Cancer Maternal Grandmother        Colon  . Diabetes Paternal Grandmother   . Cirrhosis Paternal Grandmother   . Cancer Paternal Grandmother   . Kidney disease Neg Hx   . Bladder Cancer Neg Hx   . Breast cancer Neg Hx     Past medical history, surgical history, medications, allergies, family history and social history reviewed with patient today and changes made to appropriate areas of the chart.   Review of Systems  Constitutional: Negative.   HENT: Negative.   Eyes: Negative.    Respiratory: Negative.   Cardiovascular: Negative.   Gastrointestinal: Negative.   Genitourinary: Negative.   Musculoskeletal: Positive for myalgias. Negative for back pain, falls, joint pain and neck pain.  Skin: Negative.   Neurological: Negative.   Endo/Heme/Allergies: Positive for environmental allergies. Negative for polydipsia. Does not bruise/bleed easily.  Psychiatric/Behavioral: Negative.    All other ROS negative except what is listed above and in the HPI.      Objective:    BP 128/79   Pulse 62   Temp 98.2 F (36.8 C)   Ht 5' 4.5" (1.638 m)   Wt 289 lb 9.6 oz (131.4 kg)   SpO2 98%   BMI 48.94 kg/m   Wt Readings from Last 3 Encounters:  11/01/20 289 lb 9.6 oz (131.4 kg)  08/16/20 280 lb (127 kg)  08/05/20 285 lb (129.3 kg)    Physical Exam Vitals and nursing note reviewed.  Constitutional:      General: She is not in acute distress.    Appearance: Normal appearance. She is obese. She is not ill-appearing, toxic-appearing or diaphoretic.  HENT:     Head: Normocephalic and atraumatic.     Right Ear: Tympanic membrane, ear canal and external ear normal. There is no impacted cerumen.     Left Ear: Tympanic membrane, ear canal and external ear normal. There is no impacted cerumen.     Nose: Nose normal. No congestion or rhinorrhea.     Mouth/Throat:     Mouth: Mucous membranes are moist.     Pharynx: Oropharynx is clear. No oropharyngeal exudate or posterior oropharyngeal erythema.  Eyes:     General: No scleral icterus.       Right eye: No discharge.        Left  eye: No discharge.     Extraocular Movements: Extraocular movements intact.     Conjunctiva/sclera: Conjunctivae normal.     Pupils: Pupils are equal, round, and reactive to light.  Neck:     Vascular: No carotid bruit.  Cardiovascular:     Rate and Rhythm: Normal rate and regular rhythm.     Pulses: Normal pulses.     Heart sounds: No murmur heard. No friction rub. No gallop.   Pulmonary:      Effort: Pulmonary effort is normal. No respiratory distress.     Breath sounds: Normal breath sounds. No stridor. No wheezing, rhonchi or rales.  Chest:     Chest wall: No tenderness.  Abdominal:     General: Abdomen is flat. Bowel sounds are normal. There is no distension.     Palpations: Abdomen is soft. There is no mass.     Tenderness: There is no abdominal tenderness. There is no right CVA tenderness, left CVA tenderness, guarding or rebound.     Hernia: No hernia is present.  Genitourinary:    Comments: Breast and pelvic exams deferred with shared decision making Musculoskeletal:        General: No swelling, tenderness, deformity or signs of injury.     Cervical back: Normal range of motion and neck supple. No rigidity. No muscular tenderness.     Right lower leg: No edema.     Left lower leg: No edema.  Lymphadenopathy:     Cervical: No cervical adenopathy.  Skin:    General: Skin is warm and dry.     Capillary Refill: Capillary refill takes less than 2 seconds.     Coloration: Skin is not jaundiced or pale.     Findings: No bruising, erythema, lesion or rash.  Neurological:     General: No focal deficit present.     Mental Status: She is alert and oriented to person, place, and time. Mental status is at baseline.     Cranial Nerves: No cranial nerve deficit.     Sensory: No sensory deficit.     Motor: No weakness.     Coordination: Coordination normal.     Gait: Gait normal.     Deep Tendon Reflexes: Reflexes normal.  Psychiatric:        Mood and Affect: Mood normal.        Behavior: Behavior normal.        Thought Content: Thought content normal.        Judgment: Judgment normal.     Results for orders placed or performed in visit on 08/16/20  Microscopic Examination   Urine  Result Value Ref Range   WBC, UA 0-5 0 - 5 /hpf   RBC 0-2 0 - 2 /hpf   Epithelial Cells (non renal) 0-10 0 - 10 /hpf   Bacteria, UA None seen None seen/Few  Urinalysis, Complete  Result  Value Ref Range   Specific Gravity, UA 1.010 1.005 - 1.030   pH, UA 5.5 5.0 - 7.5   Color, UA Yellow Yellow   Appearance Ur Clear Clear   Leukocytes,UA Negative Negative   Protein,UA Negative Negative/Trace   Glucose, UA Negative Negative   Ketones, UA Negative Negative   RBC, UA Negative Negative   Bilirubin, UA Negative Negative   Urobilinogen, Ur 0.2 0.2 - 1.0 mg/dL   Nitrite, UA Negative Negative   Microscopic Examination See below:       Assessment & Plan:   Problem List Items Addressed This  Visit      Cardiovascular and Mediastinum   Essential hypertension    Under good control on current regimen. Continue current regimen. Continue to monitor. Call with any concerns. Refills given. Labs drawn today.        Relevant Medications   pravastatin (PRAVACHOL) 20 MG tablet   hydrochlorothiazide (HYDRODIURIL) 25 MG tablet   Other Relevant Orders   Comprehensive metabolic panel   Microalbumin, Urine Waived     Digestive   Esophageal reflux    Under good control on current regimen. Continue current regimen. Continue to monitor. Call with any concerns. Refills given. Labs drawn today.        Relevant Medications   pantoprazole (PROTONIX) 40 MG tablet     Other   Acute anxiety    Stable. Does not need refill today. Call with any concerns. Continue to monitor.       Relevant Orders   Comprehensive metabolic panel   Hyperlipidemia    Under good control on current regimen. Continue current regimen. Continue to monitor. Call with any concerns. Refills given. Labs drawn today.        Relevant Medications   pravastatin (PRAVACHOL) 20 MG tablet   hydrochlorothiazide (HYDRODIURIL) 25 MG tablet   Other Relevant Orders   Comprehensive metabolic panel   Lipid Panel w/o Chol/HDL Ratio    Other Visit Diagnoses    Routine general medical examination at a health care facility    -  Primary   Vaccines up to date. Screening labs checked today. Cologuard and mammogram ordered  today. Continue diet and exercise. Call with any concerns.    Relevant Orders   CBC with Differential/Platelet   Comprehensive metabolic panel   Lipid Panel w/o Chol/HDL Ratio   Urinalysis, Routine w reflex microscopic   TSH   Bayer DCA Hb A1c Waived   Hepatitis C Antibody   Pain of right lower extremity       Improving. Use flexeril and mobic. Call if not getting better or getting worse. Continue to monitor.    Screening for colon cancer       Cologuard ordered today.   Relevant Orders   Cologuard   Encounter for screening mammogram for malignant neoplasm of breast       Mammogram ordered today.   Relevant Orders   MM DIAG BREAST TOMO BILATERAL   Gastroesophageal reflux disease       Relevant Medications   pantoprazole (PROTONIX) 40 MG tablet       Follow up plan: Return in about 6 months (around 05/04/2021).   LABORATORY TESTING:  - Pap smear: not applicable  IMMUNIZATIONS:   - Tdap: Tetanus vaccination status reviewed: last tetanus booster within 10 years. - Influenza: Up to date - Pneumovax: Not applicable - COVID: Up to date  SCREENING: -Mammogram: Ordered today  - Colonoscopy: cologuard ordered today   PATIENT COUNSELING:   Advised to take 1 mg of folate supplement per day if capable of pregnancy.   Sexuality: Discussed sexually transmitted diseases, partner selection, use of condoms, avoidance of unintended pregnancy  and contraceptive alternatives.   Advised to avoid cigarette smoking.  I discussed with the patient that most people either abstain from alcohol or drink within safe limits (<=14/week and <=4 drinks/occasion for males, <=7/weeks and <= 3 drinks/occasion for females) and that the risk for alcohol disorders and other health effects rises proportionally with the number of drinks per week and how often a drinker exceeds daily limits.  Discussed cessation/primary  prevention of drug use and availability of treatment for abuse.   Diet: Encouraged to  adjust caloric intake to maintain  or achieve ideal body weight, to reduce intake of dietary saturated fat and total fat, to limit sodium intake by avoiding high sodium foods and not adding table salt, and to maintain adequate dietary potassium and calcium preferably from fresh fruits, vegetables, and low-fat dairy products.    stressed the importance of regular exercise  Injury prevention: Discussed safety belts, safety helmets, smoke detector, smoking near bedding or upholstery.   Dental health: Discussed importance of regular tooth brushing, flossing, and dental visits.    NEXT PREVENTATIVE PHYSICAL DUE IN 1 YEAR. Return in about 6 months (around 05/04/2021).

## 2020-11-02 ENCOUNTER — Other Ambulatory Visit: Payer: Self-pay

## 2020-11-02 LAB — COMPREHENSIVE METABOLIC PANEL
ALT: 16 IU/L (ref 0–32)
AST: 18 IU/L (ref 0–40)
Albumin/Globulin Ratio: 1.8 (ref 1.2–2.2)
Albumin: 4.2 g/dL (ref 3.8–4.9)
Alkaline Phosphatase: 111 IU/L (ref 44–121)
BUN/Creatinine Ratio: 13 (ref 9–23)
BUN: 11 mg/dL (ref 6–24)
Bilirubin Total: 0.2 mg/dL (ref 0.0–1.2)
CO2: 28 mmol/L (ref 20–29)
Calcium: 9.1 mg/dL (ref 8.7–10.2)
Chloride: 98 mmol/L (ref 96–106)
Creatinine, Ser: 0.86 mg/dL (ref 0.57–1.00)
Globulin, Total: 2.3 g/dL (ref 1.5–4.5)
Glucose: 95 mg/dL (ref 65–99)
Potassium: 3.6 mmol/L (ref 3.5–5.2)
Sodium: 139 mmol/L (ref 134–144)
Total Protein: 6.5 g/dL (ref 6.0–8.5)
eGFR: 82 mL/min/{1.73_m2} (ref >59)

## 2020-11-02 LAB — CBC WITH DIFFERENTIAL/PLATELET
Basophils Absolute: 0 10*3/uL (ref 0.0–0.2)
Basos: 0 %
EOS (ABSOLUTE): 0.2 10*3/uL (ref 0.0–0.4)
Eos: 2 %
Hematocrit: 37.9 % (ref 34.0–46.6)
Hemoglobin: 12.4 g/dL (ref 11.1–15.9)
Immature Grans (Abs): 0 10*3/uL (ref 0.0–0.1)
Immature Granulocytes: 0 %
Lymphocytes Absolute: 3.3 10*3/uL — ABNORMAL HIGH (ref 0.7–3.1)
Lymphs: 43 %
MCH: 26.4 pg — ABNORMAL LOW (ref 26.6–33.0)
MCHC: 32.7 g/dL (ref 31.5–35.7)
MCV: 81 fL (ref 79–97)
Monocytes Absolute: 0.5 10*3/uL (ref 0.1–0.9)
Monocytes: 6 %
Neutrophils Absolute: 3.7 10*3/uL (ref 1.4–7.0)
Neutrophils: 49 %
Platelets: 249 10*3/uL (ref 150–450)
RBC: 4.7 x10E6/uL (ref 3.77–5.28)
RDW: 13.6 % (ref 11.7–15.4)
WBC: 7.7 10*3/uL (ref 3.4–10.8)

## 2020-11-02 LAB — URINALYSIS, ROUTINE W REFLEX MICROSCOPIC
Bilirubin, UA: NEGATIVE
Glucose, UA: NEGATIVE
Ketones, UA: NEGATIVE
Nitrite, UA: NEGATIVE
Protein,UA: NEGATIVE
RBC, UA: NEGATIVE
Specific Gravity, UA: 1.01 (ref 1.005–1.030)
Urobilinogen, Ur: 0.2 mg/dL (ref 0.2–1.0)
pH, UA: 6.5 (ref 5.0–7.5)

## 2020-11-02 LAB — LIPID PANEL W/O CHOL/HDL RATIO
Cholesterol, Total: 181 mg/dL (ref 100–199)
HDL: 48 mg/dL (ref >39)
LDL Chol Calc (NIH): 105 mg/dL — ABNORMAL HIGH (ref 0–99)
Triglycerides: 162 mg/dL — ABNORMAL HIGH (ref 0–149)
VLDL Cholesterol Cal: 28 mg/dL (ref 5–40)

## 2020-11-02 LAB — HEPATITIS C ANTIBODY: Hep C Virus Ab: <0.1 s/co ratio (ref 0.0–0.9)

## 2020-11-02 LAB — TSH: TSH: 1.87 u[IU]/mL (ref 0.450–4.500)

## 2020-11-02 LAB — MICROSCOPIC EXAMINATION
Bacteria, UA: NONE SEEN
RBC, Urine: NONE SEEN /hpf (ref 0–2)

## 2020-11-02 LAB — MICROALBUMIN, URINE WAIVED
Creatinine, Urine Waived: 50 mg/dL (ref 10–300)
Microalb, Ur Waived: 10 mg/L (ref 0–19)
Microalb/Creat Ratio: <30 mg/g (ref <30)

## 2020-11-02 LAB — BAYER DCA HB A1C WAIVED: HB A1C (BAYER DCA - WAIVED): 5.7 % (ref <7.0)

## 2020-11-13 ENCOUNTER — Other Ambulatory Visit: Payer: Self-pay | Admitting: Family Medicine

## 2020-11-13 MED ORDER — SUCRALFATE 1 G PO TABS
ORAL_TABLET | ORAL | 1 refills | Status: DC
  Filled 2020-11-13: qty 120, 30d supply, fill #0

## 2020-11-13 NOTE — Telephone Encounter (Signed)
Requested Prescriptions  Pending Prescriptions Disp Refills  . sucralfate (CARAFATE) 1 g tablet 120 tablet 1    Sig: TAKE 1 TABLET BY MOUTH 4 TIMES DAILY - WITH MEALS AND AT BEDTIME.     Gastroenterology: Antiacids Passed - 11/13/2020 10:12 AM      Passed - Valid encounter within last 12 months    Recent Outpatient Visits          1 week ago Routine general medical examination at a health care facility   [facility], [place], DO   3 months ago Lower abdominal pain   [facility], Scheryl Darter, NP   5 months ago COVID-19   [facility], [place], DO   10 months ago Non-recurrent acute allergic otitis media of left ear   [facility], Barbaraann Faster, NP   10 months ago Left ear pain   [facility], Barb Merino, DO      Future Appointments            In 3 months [facility], [facility] [place]           '

## 2020-11-14 ENCOUNTER — Other Ambulatory Visit: Payer: Self-pay

## 2020-11-14 MED FILL — Diclofenac Sodium Gel 1% (1.16% Diethylamine Equiv): CUTANEOUS | 30 days supply | Qty: 300 | Fill #0 | Status: AC

## 2020-11-15 ENCOUNTER — Other Ambulatory Visit: Payer: Self-pay

## 2020-12-11 MED FILL — Ezetimibe Tab 10 MG: ORAL | 90 days supply | Qty: 90 | Fill #1 | Status: AC

## 2020-12-13 ENCOUNTER — Other Ambulatory Visit: Payer: Self-pay

## 2021-01-02 ENCOUNTER — Other Ambulatory Visit: Payer: Self-pay

## 2021-01-26 ENCOUNTER — Encounter: Payer: Self-pay | Admitting: Family Medicine

## 2021-01-26 ENCOUNTER — Ambulatory Visit: Payer: 59 | Admitting: Family Medicine

## 2021-01-26 ENCOUNTER — Other Ambulatory Visit: Payer: Self-pay

## 2021-01-26 VITALS — BP 146/83 | HR 87 | Temp 98.4°F | Ht 63.66 in | Wt 277.2 lb

## 2021-01-26 DIAGNOSIS — R1084 Generalized abdominal pain: Secondary | ICD-10-CM | POA: Diagnosis not present

## 2021-01-26 DIAGNOSIS — K219 Gastro-esophageal reflux disease without esophagitis: Secondary | ICD-10-CM | POA: Diagnosis not present

## 2021-01-26 DIAGNOSIS — R140 Abdominal distension (gaseous): Secondary | ICD-10-CM

## 2021-01-26 DIAGNOSIS — R1032 Left lower quadrant pain: Secondary | ICD-10-CM | POA: Diagnosis not present

## 2021-01-26 LAB — URINALYSIS, ROUTINE W REFLEX MICROSCOPIC
Bilirubin, UA: NEGATIVE
Glucose, UA: NEGATIVE
Ketones, UA: NEGATIVE
Leukocytes,UA: NEGATIVE
Nitrite, UA: NEGATIVE
Protein,UA: NEGATIVE
RBC, UA: NEGATIVE
Specific Gravity, UA: >1.03 — ABNORMAL HIGH (ref 1.005–1.030)
Urobilinogen, Ur: 0.2 mg/dL (ref 0.2–1.0)
pH, UA: 5 (ref 5.0–7.5)

## 2021-01-26 MED ORDER — PANTOPRAZOLE SODIUM 40 MG PO TBEC
80.0000 mg | DELAYED_RELEASE_TABLET | Freq: Every day | ORAL | 1 refills | Status: DC
  Filled 2021-01-26 – 2021-03-12 (×2): qty 180, 90d supply, fill #0

## 2021-01-26 MED ORDER — SUCRALFATE 1 G PO TABS
ORAL_TABLET | ORAL | 3 refills | Status: DC
  Filled 2021-01-26 – 2021-02-26 (×2): qty 120, 30d supply, fill #0
  Filled 2021-05-15: qty 120, 30d supply, fill #1

## 2021-01-26 NOTE — Progress Notes (Signed)
BP (!) 146/83   Pulse 87   Temp 98.4 F (36.9 C)   Ht 5' 3.66" (1.617 m)   Wt 277 lb 4 oz (125.8 kg)   SpO2 99%   BMI 48.10 kg/m    Subjective:    Patient ID: Latoya Lutz, female    DOB: 1971-03-19, 50 y.o.   MRN: [id number]  HPI: Latoya Lutz is a 50 y.o. female  Chief Complaint  Patient presents with   Abdominal Pain    Bloating, extreme pain when trying to eat. Started on Monday, some mild diarrhea, but alternating with constipation    ABDOMINAL PAIN  Duration: about 4 days Onset: sudden Severity: severe Quality: cramping, bloating Location:  diffuse  Episode duration: 15 minutes Radiation: no Frequency: with eating Alleviating factors: nothing Aggravating factors: eating Status: worse Treatments attempted: antacids, gas-x Fever: no Nausea: yes Vomiting: no Weight loss: yes Decreased appetite: yes Diarrhea: yes Constipation: yes Blood in stool: no Heartburn: yes Jaundice: no Rash: no Dysuria/urinary frequency: no Hematuria: no History of sexually transmitted disease: no Recurrent NSAID use: no  Relevant past medical, surgical, family and social history reviewed and updated as indicated. Interim medical history since our last visit reviewed. Allergies and medications reviewed and updated.  Review of Systems  Constitutional: Negative.   Respiratory: Negative.    Cardiovascular: Negative.   Gastrointestinal:  Positive for abdominal distention, abdominal pain, constipation, diarrhea and nausea. Negative for anal bleeding, blood in stool, rectal pain and vomiting.  Musculoskeletal: Negative.   Skin: Negative.   Neurological: Negative.   Psychiatric/Behavioral: Negative.     Per HPI unless specifically indicated above     Objective:    BP (!) 146/83   Pulse 87   Temp 98.4 F (36.9 C)   Ht 5' 3.66" (1.617 m)   Wt 277 lb 4 oz (125.8 kg)   SpO2 99%   BMI 48.10 kg/m   Wt Readings from Last 3 Encounters:  01/26/21 277 lb 4 oz  (125.8 kg)  11/01/20 289 lb 9.6 oz (131.4 kg)  08/16/20 280 lb (127 kg)    Physical Exam Vitals and nursing note reviewed.  Constitutional:      General: She is not in acute distress.    Appearance: Normal appearance. She is not ill-appearing, toxic-appearing or diaphoretic.  HENT:     Head: Normocephalic and atraumatic.     Right Ear: External ear normal.     Left Ear: External ear normal.     Nose: Nose normal.     Mouth/Throat:     Mouth: Mucous membranes are moist.     Pharynx: Oropharynx is clear.  Eyes:     General: No scleral icterus.       Right eye: No discharge.        Left eye: No discharge.     Extraocular Movements: Extraocular movements intact.     Conjunctiva/sclera: Conjunctivae normal.     Pupils: Pupils are equal, round, and reactive to light.  Cardiovascular:     Rate and Rhythm: Normal rate and regular rhythm.     Pulses: Normal pulses.     Heart sounds: Normal heart sounds. No murmur heard.   No friction rub. No gallop.  Pulmonary:     Effort: Pulmonary effort is normal. No respiratory distress.     Breath sounds: Normal breath sounds. No stridor. No wheezing, rhonchi or rales.  Chest:     Chest wall: No tenderness.  Abdominal:  General: Abdomen is flat. Bowel sounds are normal. There is no distension or abdominal bruit. There are no signs of injury.     Palpations: Abdomen is soft.     Tenderness: There is abdominal tenderness in the epigastric area.  Musculoskeletal:        General: Normal range of motion.     Cervical back: Normal range of motion and neck supple.  Skin:    General: Skin is warm and dry.     Capillary Refill: Capillary refill takes less than 2 seconds.     Coloration: Skin is not jaundiced or pale.     Findings: No bruising, erythema, lesion or rash.  Neurological:     General: No focal deficit present.     Mental Status: She is alert and oriented to person, place, and time. Mental status is at baseline.  Psychiatric:         Mood and Affect: Mood normal.        Behavior: Behavior normal.        Thought Content: Thought content normal.        Judgment: Judgment normal.    Results for orders placed or performed in visit on 01/26/21  Urinalysis, Routine w reflex microscopic  Result Value Ref Range   Specific Gravity, UA >1.030 (H) 1.005 - 1.030   pH, UA 5.0 5.0 - 7.5   Color, UA Yellow Yellow   Appearance Ur Clear Clear   Leukocytes,UA Negative Negative   Protein,UA Negative Negative/Trace   Glucose, UA Negative Negative   Ketones, UA Negative Negative   RBC, UA Negative Negative   Bilirubin, UA Negative Negative   Urobilinogen, Ur 0.2 0.2 - 1.0 mg/dL   Nitrite, UA Negative Negative      Assessment & Plan:   Problem List Items Addressed This Visit   None Visit Diagnoses     Generalized abdominal pain    -  Primary   Concern for gastritis vs diverticulitis. Will check CT. Increase protonix to '80mg'$  and carafate to 3x a day. Recheck after CT. Call with any concerns.    Relevant Orders   CBC With Differential/Platelet   Comprehensive metabolic panel   Urinalysis, Routine w reflex microscopic (Completed)   CBC with Differential/Platelet   CT Abdomen Pelvis Wo Contrast   Gastroesophageal reflux disease       Relevant Medications   pantoprazole (PROTONIX) 40 MG tablet   sucralfate (CARAFATE) 1 g tablet   Other Relevant Orders   CT Abdomen Pelvis Wo Contrast   Abdominal distension (gaseous)       Relevant Orders   CT Abdomen Pelvis Wo Contrast        Follow up plan: Return 2-3 weeks after CT.

## 2021-01-26 NOTE — Telephone Encounter (Signed)
Unfortunately needs to be seen for something to be called in. Can see her at 4:20 double book or she will have to go to urgent care.

## 2021-01-27 LAB — COMPREHENSIVE METABOLIC PANEL
ALT: 16 IU/L (ref 0–32)
AST: 22 IU/L (ref 0–40)
Albumin/Globulin Ratio: 2.1 (ref 1.2–2.2)
Albumin: 4.7 g/dL (ref 3.8–4.9)
Alkaline Phosphatase: 99 IU/L (ref 44–121)
BUN/Creatinine Ratio: 17 (ref 9–23)
BUN: 14 mg/dL (ref 6–24)
Bilirubin Total: 0.3 mg/dL (ref 0.0–1.2)
CO2: 27 mmol/L (ref 20–29)
Calcium: 9.6 mg/dL (ref 8.7–10.2)
Chloride: 101 mmol/L (ref 96–106)
Creatinine, Ser: 0.84 mg/dL (ref 0.57–1.00)
Globulin, Total: 2.2 g/dL (ref 1.5–4.5)
Glucose: 109 mg/dL — ABNORMAL HIGH (ref 65–99)
Potassium: 3.6 mmol/L (ref 3.5–5.2)
Sodium: 142 mmol/L (ref 134–144)
Total Protein: 6.9 g/dL (ref 6.0–8.5)
eGFR: 84 mL/min/{1.73_m2} (ref >59)

## 2021-01-27 LAB — CBC WITH DIFFERENTIAL/PLATELET
Basophils Absolute: 0 10*3/uL (ref 0.0–0.2)
Basos: 0 %
EOS (ABSOLUTE): 0.1 10*3/uL (ref 0.0–0.4)
Eos: 1 %
Hematocrit: 39.4 % (ref 34.0–46.6)
Hemoglobin: 12.8 g/dL (ref 11.1–15.9)
Immature Grans (Abs): 0 10*3/uL (ref 0.0–0.1)
Immature Granulocytes: 0 %
Lymphocytes Absolute: 2.4 10*3/uL (ref 0.7–3.1)
Lymphs: 26 %
MCH: 26.5 pg — ABNORMAL LOW (ref 26.6–33.0)
MCHC: 32.5 g/dL (ref 31.5–35.7)
MCV: 82 fL (ref 79–97)
Monocytes Absolute: 0.7 10*3/uL (ref 0.1–0.9)
Monocytes: 8 %
Neutrophils Absolute: 6.2 10*3/uL (ref 1.4–7.0)
Neutrophils: 65 %
Platelets: 244 10*3/uL (ref 150–450)
RBC: 4.83 x10E6/uL (ref 3.77–5.28)
RDW: 13.8 % (ref 11.7–15.4)
WBC: 9.6 10*3/uL (ref 3.4–10.8)

## 2021-02-06 ENCOUNTER — Ambulatory Visit: Payer: 59 | Admitting: Family Medicine

## 2021-02-06 ENCOUNTER — Other Ambulatory Visit: Payer: 59

## 2021-02-10 ENCOUNTER — Telehealth: Payer: 59 | Admitting: Family Medicine

## 2021-02-10 ENCOUNTER — Other Ambulatory Visit: Payer: Self-pay

## 2021-02-14 ENCOUNTER — Other Ambulatory Visit: Payer: Self-pay

## 2021-02-14 MED FILL — Diclofenac Sodium Gel 1% (1.16% Diethylamine Equiv): CUTANEOUS | 30 days supply | Qty: 300 | Fill #1 | Status: AC

## 2021-02-16 ENCOUNTER — Other Ambulatory Visit: Payer: Self-pay

## 2021-02-20 NOTE — Progress Notes (Signed)
02/21/2021 4:47 PM   [place] [birth year] [id number]  Referring provider: Valerie Roys, DO [address],  [place] [address]  Chief Complaint  Patient presents with   Urethral Stricture    Urological history: 1. Urethral stricture - diagnosed by Dr. Madelin Headings with urethral dilation and had been receiving serial dilations every 4 to 6 months since 2013 - managed with urethral dilation q 6 months at this time  2. Nephrolithiasis - right ureteral stones treated with ESWL and right URS/LL/right ureteral stent placement and stent removal in winter 2016 - CT Renal stone study performed on 04/05/2017 noted no nephroureterolithiasis.  No hydronephrosis  HPI: Latoya Lutz is a 50 y.o. female who presents today for urethral dilation.    She has noticed more nocturia and difficulty urinating in the last several weeks.  Patient denies any modifying or aggravating factors.  Patient denies any gross hematuria, dysuria or suprapubic/flank pain.  Patient denies any fevers, chills, nausea or vomiting.    UA benign.   PMH: Past Medical History:  Diagnosis Date   Acid reflux    Actinic keratosis    Arthritis    Atypical mole 10/27/2019   Left labia mucosa    Candidal dermatitis 05/25/2014   Cervical spine disease    Chicken pox    Chronic urethral narrowing    undergoing stretching   Complication of anesthesia    Endometriosis    Dr. Rushie Chestnut at [facility], removed    Esophageal reflux 05/25/2014   Gross hematuria 04/25/2015   H/O cystoscopy    normal   Heart murmur    Hx of basal cell carcinoma 12/29/2008   L low back   Infection of urinary tract 09/11/2014   Kidney stone 09/11/2014   Kidney stones    Microscopic hematuria 09/11/2014   Obesity, morbid, BMI 40.0-49.9 ([place]) 04/10/2012   PONV (postoperative nausea and vomiting)    Right ureteral stone 04/28/2015   Skin cancer    Urinary retention     Surgical History: Past Surgical History:  Procedure Laterality  Date   ABDOMINAL HYSTERECTOMY     [facility] complete   BLADDER SURGERY  07/2003   CARPAL TUNNEL RELEASE Right    CESAREAN SECTION  1998   CHOLECYSTECTOMY     CYSTOSCOPY W/ RETROGRADES Right 05/16/2015   Procedure: CYSTOSCOPY WITH RETROGRADE PYELOGRAM;  Surgeon: Nickie Retort, MD;  Location: [facility];  Service: Urology;  Laterality: Right;   CYSTOSCOPY W/ URETERAL STENT PLACEMENT Right 05/16/2015   Procedure: CYSTOSCOPY WITH STENT REPLACEMENT;  Surgeon: Nickie Retort, MD;  Location: [facility];  Service: Urology;  Laterality: Right;   CYSTOSCOPY WITH STENT PLACEMENT Right 04/29/2015   Procedure: CYSTOSCOPY WITH STENT PLACEMENT;  Surgeon: Nickie Retort, MD;  Location: [facility];  Service: Urology;  Laterality: Right;   EXTRACORPOREAL SHOCK WAVE LITHOTRIPSY Left 06/02/2015   Procedure: EXTRACORPOREAL SHOCK WAVE LITHOTRIPSY (ESWL);  Surgeon: Hollice Espy, MD;  Location: [facility];  Service: Urology;  Laterality: Left;   FOOT SURGERY  2003   KNEE SURGERY  2005   TONSILLECTOMY     TONSILLECTOMY     lingual growth on vocal cord removal   URETEROSCOPY WITH HOLMIUM LASER LITHOTRIPSY Right 05/16/2015   Procedure: URETEROSCOPY WITH HOLMIUM LASER LITHOTRIPSY;  Surgeon: Nickie Retort, MD;  Location: [facility];  Service: Urology;  Laterality: Right;    Home Medications:  Allergies as of 02/21/2021       Reactions   Atorvastatin  Other (See Comments)   Leg cramps        Medication List        Accurate as of February 21, 2021  4:47 PM. If you have any questions, ask your nurse or doctor.          acyclovir 400 MG tablet Commonly known as: ZOVIRAX Take 1 tablet (400 mg total) by mouth 3 (three) times daily.   albuterol 108 (90 Base) MCG/ACT inhaler Commonly known as: VENTOLIN HFA INHALE 2 PUFFS BY MOUTH EVERY 6 HOURS AS NEEDED FOR WHEEZING OR SHORTNESS OF BREATH.   azelastine 0.1 % nasal spray Commonly known as: ASTELIN PLACE 1 SPRAY INTO BOTH NOSTRILS 2 TIMES DAILY.    busPIRone 10 MG tablet Commonly known as: BUSPAR Take 1 tablet (10 mg total) by mouth 3 (three) times daily.   CALCIUM 600 PO Take 1 tablet by mouth daily.   cyclobenzaprine 10 MG tablet Commonly known as: FLEXERIL Take 1 tablet (10 mg total) by mouth at bedtime.   diclofenac Sodium 1 % Gel Commonly known as: VOLTAREN APPLY 2 GRAMS TO THE AFFECTED AREA(S) 4 TIMES A DAY   ezetimibe 10 MG tablet Commonly known as: ZETIA TAKE 1 TABLET (10 MG TOTAL) BY MOUTH DAILY.   fexofenadine 180 MG tablet Commonly known as: ALLEGRA Take 180 mg by mouth daily.   fluticasone 50 MCG/ACT nasal spray Commonly known as: FLONASE PLACE 2 SPRAYS INTO BOTH NOSTRILS DAILY.   hydrochlorothiazide 25 MG tablet Commonly known as: HYDRODIURIL TAKE 1 TABLET BY MOUTH DAILY.   meloxicam 15 MG tablet Commonly known as: MOBIC TAKE 1 TABLET BY MOUTH DAILY.   Menopause Formula Tabs Take by mouth. Reported on 11/04/2015   ondansetron 4 MG tablet Commonly known as: ZOFRAN TAKE 1 TABLET (4 MG TOTAL) BY MOUTH EVERY 8 (EIGHT) HOURS AS NEEDED FOR NAUSEA OR VOMITING.   pantoprazole 40 MG tablet Commonly known as: PROTONIX Take 2 tablets (80 mg total) by mouth daily.   pravastatin 20 MG tablet Commonly known as: PRAVACHOL TAKE 1 TABLET BY MOUTH DAILY   pseudoephedrine 30 MG tablet Commonly known as: SUDAFED Take 30 mg by mouth 3 (three) times daily.   Simethicone 180 MG Caps Take 1 capsule (180 mg total) by mouth 3 (three) times daily with meals.   sucralfate 1 g tablet Commonly known as: CARAFATE TAKE 1 TABLET BY MOUTH 4 TIMES DAILY - WITH MEALS AND AT BEDTIME.   tamsulosin 0.4 MG Caps capsule Commonly known as: FLOMAX TAKE 1 CAPSULE (0.4 MG TOTAL) BY MOUTH DAILY.   Unifine Pentips 31G X 8 MM Misc Generic drug: Insulin Pen Needle   vitamin E 180 MG (400 UNITS) capsule Take 400 Units by mouth daily.        Allergies:  Allergies  Allergen Reactions   Atorvastatin Other (See Comments)     Leg cramps    Family History: Family History  Problem Relation Age of Onset   Heart disease Mother        Pacemaker and defib   Hypertension Mother    Hyperlipidemia Mother    Cancer Father    COPD Father        Lung and brain cancer   Cancer Maternal Grandmother        Colon   Diabetes Paternal Grandmother    Cirrhosis Paternal Grandmother    Cancer Paternal Grandmother    Kidney disease Neg Hx    Bladder Cancer Neg Hx    Breast cancer Neg  Hx     Social History:  reports that she quit smoking about 12 years ago. Her smoking use included cigarettes. She has a 8.50 pack-year smoking history. She has never used smokeless tobacco. She reports that she does not drink alcohol and does not use drugs.  ROS: Pertinent ROS in HPI  Physical Exam: BP 134/85   Pulse 76   Ht 5' 3.66" (1.617 m)   Wt 285 lb 14.4 oz (129.7 kg)   BMI 49.60 kg/m   Constitutional:  Well nourished. Alert and oriented, No acute distress. HEENT: [place] AT, mask in place.  Trachea midline Cardiovascular: No clubbing, cyanosis, or edema. Respiratory: Normal respiratory effort, no increased work of breathing. GU: No CVA tenderness.  No bladder fullness or masses.  Normal external genitalia, normal pubic hair distribution, no lesions.  Normal urethral meatus, no lesions, no prolapse, no discharge.   No urethral masses, tenderness and/or tenderness. No bladder fullness, tenderness or masses. Normal vagina mucosa, good estrogen effect, no discharge, no lesions, good pelvic support. Neurologic: Grossly intact, no focal deficits, moving all 4 extremities. Psychiatric: Normal mood and affect.    Laboratory Data: Urinalysis Component     Latest Ref Rng & Units 02/21/2021  Specific Gravity, UA     1.005 - 1.030 1.015  pH, UA     5.0 - 7.5 6.0  Color, UA     Yellow Yellow  Appearance Ur     Clear Clear  Leukocytes,UA     Negative Negative  Protein,UA     Negative/Trace Negative  Glucose, UA     Negative  Negative  Ketones, UA     Negative Negative  RBC, UA     Negative Negative  Bilirubin, UA     Negative Negative  Urobilinogen, Ur     0.2 - 1.0 mg/dL 0.2  Nitrite, UA     Negative Negative  Microscopic Examination      See below:   Component     Latest Ref Rng & Units 02/21/2021          WBC, UA     0 - 5 /hpf 0-5  RBC     0 - 2 /hpf 0-2  Epithelial Cells (non renal)     0 - 10 /hpf 0-10  Bacteria, UA     None seen/Few None seen  I have reviewed the labs.   Pertinent Imaging: No imaging since last visit  Procedure Patient is placed in stirrups and her urethral meatus and vulva are cleansed with Betadine.  2% Lidocaine jelly was inserted into her urethra.  I then dilated her with Elby Showers sounds to a 24 without difficultly.  She tolerated the procedure well.  She will return in 6 months.   Assessment & Plan:    1. Urethral stricture -tolerated well  Return in about 6 months (around 08/21/2021) for urethral dilation.  These notes generated with voice recognition software. I apologize for typographical errors.  Zara Council, PA-C  [facility] [address]  [place], [place] [address] [phone]

## 2021-02-21 ENCOUNTER — Encounter: Payer: Self-pay | Admitting: Urology

## 2021-02-21 ENCOUNTER — Other Ambulatory Visit: Payer: Self-pay

## 2021-02-21 ENCOUNTER — Ambulatory Visit (INDEPENDENT_AMBULATORY_CARE_PROVIDER_SITE_OTHER): Payer: 59 | Admitting: Urology

## 2021-02-21 VITALS — BP 134/85 | HR 76 | Ht 63.66 in | Wt 285.9 lb

## 2021-02-21 DIAGNOSIS — N3592 Unspecified urethral stricture, female: Secondary | ICD-10-CM | POA: Diagnosis not present

## 2021-02-22 LAB — URINALYSIS, COMPLETE
Bilirubin, UA: NEGATIVE
Glucose, UA: NEGATIVE
Ketones, UA: NEGATIVE
Leukocytes,UA: NEGATIVE
Nitrite, UA: NEGATIVE
Protein,UA: NEGATIVE
RBC, UA: NEGATIVE
Specific Gravity, UA: 1.015 (ref 1.005–1.030)
Urobilinogen, Ur: 0.2 mg/dL (ref 0.2–1.0)
pH, UA: 6 (ref 5.0–7.5)

## 2021-02-22 LAB — MICROSCOPIC EXAMINATION: Bacteria, UA: NONE SEEN

## 2021-02-27 ENCOUNTER — Other Ambulatory Visit: Payer: Self-pay

## 2021-03-12 MED FILL — Ezetimibe Tab 10 MG: ORAL | 90 days supply | Qty: 90 | Fill #2 | Status: AC

## 2021-03-13 ENCOUNTER — Other Ambulatory Visit: Payer: Self-pay

## 2021-03-15 ENCOUNTER — Other Ambulatory Visit: Payer: Self-pay

## 2021-04-05 ENCOUNTER — Ambulatory Visit (INDEPENDENT_AMBULATORY_CARE_PROVIDER_SITE_OTHER): Payer: 59 | Admitting: Dermatology

## 2021-04-05 ENCOUNTER — Other Ambulatory Visit: Payer: Self-pay

## 2021-04-05 DIAGNOSIS — Z1283 Encounter for screening for malignant neoplasm of skin: Secondary | ICD-10-CM

## 2021-04-05 DIAGNOSIS — D485 Neoplasm of uncertain behavior of skin: Secondary | ICD-10-CM

## 2021-04-05 DIAGNOSIS — Z85828 Personal history of other malignant neoplasm of skin: Secondary | ICD-10-CM

## 2021-04-05 DIAGNOSIS — L821 Other seborrheic keratosis: Secondary | ICD-10-CM | POA: Diagnosis not present

## 2021-04-05 DIAGNOSIS — D2271 Melanocytic nevi of right lower limb, including hip: Secondary | ICD-10-CM | POA: Diagnosis not present

## 2021-04-05 DIAGNOSIS — L578 Other skin changes due to chronic exposure to nonionizing radiation: Secondary | ICD-10-CM | POA: Diagnosis not present

## 2021-04-05 DIAGNOSIS — L814 Other melanin hyperpigmentation: Secondary | ICD-10-CM

## 2021-04-05 DIAGNOSIS — D225 Melanocytic nevi of trunk: Secondary | ICD-10-CM

## 2021-04-05 DIAGNOSIS — D239 Other benign neoplasm of skin, unspecified: Secondary | ICD-10-CM

## 2021-04-05 DIAGNOSIS — L905 Scar conditions and fibrosis of skin: Secondary | ICD-10-CM

## 2021-04-05 DIAGNOSIS — Z86018 Personal history of other benign neoplasm: Secondary | ICD-10-CM | POA: Diagnosis not present

## 2021-04-05 DIAGNOSIS — D229 Melanocytic nevi, unspecified: Secondary | ICD-10-CM

## 2021-04-05 DIAGNOSIS — D1801 Hemangioma of skin and subcutaneous tissue: Secondary | ICD-10-CM

## 2021-04-05 HISTORY — DX: Other benign neoplasm of skin, unspecified: D23.9

## 2021-04-05 NOTE — Progress Notes (Signed)
Follow-Up Visit   Subjective  Latoya Lutz is a 50 y.o. female who presents for the following: Annual Exam (Hx BCC and atypical proliferation - patient has noticed irritated lesions under her breast and between her breast that she would like treated. She also c/o irritation under her belly and would like to discuss treatment options.).  The following portions of the chart were reviewed this encounter and updated as appropriate:   Tobacco  Allergies  Meds  Problems  Med Hx  Surg Hx  Fam Hx     Review of Systems:  No other skin or systemic complaints except as noted in HPI or Assessment and Plan.  Objective  Well appearing patient in no apparent distress; mood and affect are within normal limits.  A full examination was performed including scalp, head, eyes, ears, nose, lips, neck, chest, axillae, abdomen, back, buttocks, bilateral upper extremities, bilateral lower extremities, hands, feet, fingers, toes, fingernails, and toenails. All findings within normal limits unless otherwise noted below.  Back Scattered brown macules on the back.   R upper back sup 1.0 x 0.6cm  R paraspinal bra line 1.1 cm  L upper back paraspinal 1.0 x 0.6 cm      R post med thigh near popliteal 0.2 cm irregular brown macule.       Assessment & Plan  Atypical nevus L labia mucosa /atypical melanocytic proliferation -  Patient was [facility] by [facility] and by  Dr. Anne Fu [facility] and had additional biopsies done. She states they were not concerned about the results and patient hasn't noticed recurrence. Continue annual vaginal exams with GYN or PCP. Reviewed notes/records from [facility] visits.  Nevus -recheck nevi of the upper back and compare photos at follow-up Back Benign-appearing.  Observation.  Call clinic for new or changing moles.  Recommend daily use of broad spectrum spf 30+ sunscreen to sun-exposed areas.   Neoplasm of uncertain behavior of skin R post med thigh  near popliteal Epidermal / dermal shaving  Lesion diameter (cm):  0.2 Informed consent: discussed and consent obtained   Timeout: patient name, date of birth, surgical site, and procedure verified   Procedure prep:  Patient was prepped and draped in usual sterile fashion Prep type:  Isopropyl alcohol Anesthesia: the lesion was anesthetized in a standard fashion   Anesthetic:  1% lidocaine w/ epinephrine 1-100,000 buffered w/ 8.4% NaHCO3 Instrument used: flexible razor blade   Hemostasis achieved with: pressure, aluminum chloride and electrodesiccation   Outcome: patient tolerated procedure well   Post-procedure details: sterile dressing applied and wound care instructions given   Dressing type: bandage and petrolatum    Specimen 1 - Surgical pathology Differential Diagnosis: D48.5 r/o dysplastic nevus Check Margins: No  Skin cancer screening  Lentigines - Scattered tan macules - Due to sun exposure - Benign-appearing, observe - Recommend daily broad spectrum sunscreen SPF 30+ to sun-exposed areas, reapply every 2 hours as needed. - Call for any changes  Seborrheic Keratoses - Stuck-on, waxy, tan-brown papules and/or plaques  - Benign-appearing - Discussed benign etiology and prognosis. - Observe - Call for any changes  Melanocytic Nevi -recheck larger nevi on the upper back at next visit -see photos - Tan-brown and/or pink-flesh-colored symmetric macules and papules - Benign appearing on exam today - Observation - Call clinic for new or changing moles - Recommend daily use of broad spectrum spf 30+ sunscreen to sun-exposed areas.   Hemangiomas - Red papules - Discussed benign nature - Observe -  Call for any changes  Actinic Damage - Chronic condition, secondary to cumulative UV/sun exposure - diffuse scaly erythematous macules with underlying dyspigmentation - Recommend daily broad spectrum sunscreen SPF 30+ to sun-exposed areas, reapply every 2 hours as needed.   - Staying in the shade or wearing long sleeves, sun glasses (UVA+UVB protection) and wide brim hats (4-inch brim around the entire circumference of the hat) are also recommended for sun protection.  - Call for new or changing lesions.  History of Basal Cell Carcinoma of the Skin - No evidence of recurrence today - Recommend regular full body skin exams - Recommend daily broad spectrum sunscreen SPF 30+ to sun-exposed areas, reapply every 2 hours as needed.  - Call if any new or changing lesions are noted between office visits  Skin cancer screening performed today.  Return in about 6 months (around 10/04/2021) for recheck nevi on the back .  Luther Redo, CMA, am acting as scribe for Sarina Ser, MD . Documentation: I have reviewed the above documentation for accuracy and completeness, and I agree with the above.  Sarina Ser, MD

## 2021-04-05 NOTE — Patient Instructions (Addendum)
Wound Care Instructions  Cleanse wound gently with soap and water once a day then pat dry with clean gauze. Apply a thing coat of Petrolatum (petroleum jelly, "Vaseline") over the wound (unless you have an allergy to this). We recommend that you use a new, sterile tube of Vaseline. Do not pick or remove scabs. Do not remove the yellow or white "healing tissue" from the base of the wound.  Cover the wound with fresh, clean, nonstick gauze and secure with paper tape. You may use Band-Aids in place of gauze and tape if the would is small enough, but would recommend trimming much of the tape off as there is often too much. Sometimes Band-Aids can irritate the skin.  You should call the office for your biopsy report after 1 week if you have not already been contacted.  If you experience any problems, such as abnormal amounts of bleeding, swelling, significant bruising, significant pain, or evidence of infection, please call the office immediately.  FOR ADULT SURGERY PATIENTS: If you need something for pain relief you may take 1 extra strength Tylenol (acetaminophen) AND 2 Ibuprofen (200mg each) together every 4 hours as needed for pain. (do not take these if you are allergic to them or if you have a reason you should not take them.) Typically, you may only need pain medication for 1 to 3 days.   If you have any questions or concerns for your doctor, please call our main line at [phone] and press option 4 to reach your doctor's medical assistant. If no one answers, please leave a voicemail as directed and we will return your call as soon as possible. Messages left after 4 pm will be answered the following business day.   You may also send us a message via MyChart. We typically respond to MyChart messages within 1-2 business days.  For prescription refills, please ask your pharmacy to contact our office. Our fax number is [phone].  If you have an urgent issue when the clinic is closed that  cannot wait until the next business day, you can page your doctor at the number below.    Please note that while we do our best to be available for urgent issues outside of office hours, we are not available 24/7.   If you have an urgent issue and are unable to reach us, you may choose to seek medical care at your doctor's office, retail clinic, urgent care center, or emergency room.  If you have a medical emergency, please immediately call 911 or go to the emergency department.  Pager Numbers  - Dr. Kowalski: [phone]  - Dr. Moye: [phone]  - Dr. Stewart: [phone]  In the event of inclement weather, please call our main line at [phone] for an update on the status of any delays or closures.  Dermatology Medication Tips: Please keep the boxes that topical medications come in in order to help keep track of the instructions about where and how to use these. Pharmacies typically print the medication instructions only on the boxes and not directly on the medication tubes.   If your medication is too expensive, please contact our office at [phone] option 4 or send us a message through MyChart.   We are unable to tell what your co-pay for medications will be in advance as this is different depending on your insurance coverage. However, we may be able to find a substitute medication at lower cost or fill out paperwork to get insurance to cover a needed   medication.   If a prior authorization is required to get your medication covered by your insurance company, please allow us 1-2 business days to complete this process.  Drug prices often vary depending on where the prescription is filled and some pharmacies may offer cheaper prices.  The website www.goodrx.com contains coupons for medications through different pharmacies. The prices here do not account for what the cost may be with help from insurance (it may be cheaper with your insurance), but the website can give you the  price if you did not use any insurance.  - You can print the associated coupon and take it with your prescription to the pharmacy.  - You may also stop by our office during regular business hours and pick up a GoodRx coupon card.  - If you need your prescription sent electronically to a different pharmacy, notify our office through [facility] MyChart or by phone at [phone] option 4.   

## 2021-04-06 ENCOUNTER — Encounter: Payer: Self-pay | Admitting: Dermatology

## 2021-04-11 ENCOUNTER — Telehealth: Payer: Self-pay

## 2021-04-11 NOTE — Telephone Encounter (Signed)
Advised pt of bx results/sh ?

## 2021-04-11 NOTE — Telephone Encounter (Signed)
LM on VM please return my call  

## 2021-04-11 NOTE — Telephone Encounter (Signed)
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

## 2021-04-14 ENCOUNTER — Other Ambulatory Visit: Payer: Self-pay | Admitting: Family Medicine

## 2021-04-14 DIAGNOSIS — Z1231 Encounter for screening mammogram for malignant neoplasm of breast: Secondary | ICD-10-CM

## 2021-05-09 ENCOUNTER — Ambulatory Visit
Admission: RE | Admit: 2021-05-09 | Discharge: 2021-05-09 | Disposition: A | Discharge status: Home / Self Care | Payer: 59 | Source: Ambulatory Visit | Attending: Family Medicine | Admitting: Family Medicine

## 2021-05-09 ENCOUNTER — Other Ambulatory Visit: Payer: Self-pay

## 2021-05-09 DIAGNOSIS — Z1231 Encounter for screening mammogram for malignant neoplasm of breast: Secondary | ICD-10-CM | POA: Diagnosis not present

## 2021-05-11 ENCOUNTER — Other Ambulatory Visit: Payer: Self-pay | Admitting: Orthopedic Surgery

## 2021-05-12 ENCOUNTER — Other Ambulatory Visit: Payer: Self-pay | Admitting: Family Medicine

## 2021-05-12 ENCOUNTER — Other Ambulatory Visit: Payer: Self-pay

## 2021-05-14 NOTE — Telephone Encounter (Signed)
Requested medication (s) are due for refill today: yes  Requested medication (s) are on the active medication list: yes  Last refill:  05/23/20 300 grams 2 RF  Future visit scheduled: no  Notes to clinic:  end date 05/23/21   Requested Prescriptions  Pending Prescriptions Disp Refills   diclofenac Sodium (VOLTAREN) 1 % GEL [Pharmacy Med Name: diclofenac Sodium (VOLTAREN) 1 % Gel] 300 g 2    Sig: APPLY 2 GRAMS TO THE AFFECTED AREA(S) 4 TIMES A DAY     Analgesics:  Topicals Passed - 05/12/2021  1:35 PM      Passed - Valid encounter within last 12 months    Recent Outpatient Visits           3 months ago Generalized abdominal pain   [facility], Megan P, DO   6 months ago Routine general medical examination at a health care facility   [facility], Gillespie P, DO   9 months ago Lower abdominal pain   [place], Scheryl Darter, NP   11 months ago COVID-19   [employer], [place], DO   1 year ago Non-recurrent acute allergic otitis media of left ear   [employer] Venita Lick, NP       Future Appointments             In 3 months [place], Gordan Payment [place]   In 4 months Ralene Bathe, MD [place]

## 2021-05-15 ENCOUNTER — Other Ambulatory Visit: Payer: Self-pay

## 2021-05-15 NOTE — Telephone Encounter (Signed)
Appointment scheduled for 05/19/2021.

## 2021-05-16 ENCOUNTER — Other Ambulatory Visit: Payer: Self-pay

## 2021-05-16 MED FILL — Diclofenac Sodium Gel 1% (1.16% Diethylamine Equiv): CUTANEOUS | 37 days supply | Qty: 300 | Fill #0 | Status: AC

## 2021-05-19 ENCOUNTER — Encounter: Payer: Self-pay | Admitting: Family Medicine

## 2021-05-19 ENCOUNTER — Ambulatory Visit: Payer: 59 | Admitting: Family Medicine

## 2021-05-19 ENCOUNTER — Other Ambulatory Visit: Payer: Self-pay

## 2021-05-19 VITALS — BP 114/76 | HR 62 | Temp 97.5°F | Wt 285.6 lb

## 2021-05-19 DIAGNOSIS — E782 Mixed hyperlipidemia: Secondary | ICD-10-CM

## 2021-05-19 DIAGNOSIS — K219 Gastro-esophageal reflux disease without esophagitis: Secondary | ICD-10-CM

## 2021-05-19 DIAGNOSIS — I10 Essential (primary) hypertension: Secondary | ICD-10-CM | POA: Diagnosis not present

## 2021-05-19 DIAGNOSIS — Z1211 Encounter for screening for malignant neoplasm of colon: Secondary | ICD-10-CM | POA: Diagnosis not present

## 2021-05-19 DIAGNOSIS — Z23 Encounter for immunization: Secondary | ICD-10-CM

## 2021-05-19 DIAGNOSIS — F419 Anxiety disorder, unspecified: Secondary | ICD-10-CM | POA: Diagnosis not present

## 2021-05-19 MED ORDER — BUSPIRONE HCL 10 MG PO TABS
10.0000 mg | ORAL_TABLET | Freq: Three times a day (TID) | ORAL | 6 refills | Status: DC
  Filled 2021-05-19: qty 90, 30d supply, fill #0

## 2021-05-19 MED ORDER — PANTOPRAZOLE SODIUM 40 MG PO TBEC
80.0000 mg | DELAYED_RELEASE_TABLET | Freq: Every day | ORAL | 1 refills | Status: DC
  Filled 2021-05-19 – 2021-07-30 (×2): qty 180, 90d supply, fill #0
  Filled 2021-11-05: qty 180, 90d supply, fill #1

## 2021-05-19 MED ORDER — CYCLOBENZAPRINE HCL 10 MG PO TABS
10.0000 mg | ORAL_TABLET | Freq: Every day | ORAL | 2 refills | Status: DC
  Filled 2021-05-19: qty 30, 30d supply, fill #0

## 2021-05-19 MED ORDER — MELOXICAM 15 MG PO TABS
ORAL_TABLET | Freq: Every day | ORAL | 3 refills | Status: AC
Start: 2021-05-19 — End: 2022-07-16
  Filled 2021-05-19 – 2022-04-15 (×2): qty 90, 90d supply, fill #0

## 2021-05-19 MED ORDER — SUCRALFATE 1 G PO TABS
ORAL_TABLET | ORAL | 3 refills | Status: DC
Start: 2021-05-19 — End: 2022-05-29
  Filled 2021-05-19 – 2021-10-16 (×3): qty 120, 30d supply, fill #0
  Filled 2022-02-04: qty 120, 30d supply, fill #1

## 2021-05-19 MED ORDER — EZETIMIBE 10 MG PO TABS
ORAL_TABLET | Freq: Every day | ORAL | 3 refills | Status: DC
  Filled 2021-05-19 – 2021-08-27 (×2): qty 90, 90d supply, fill #0
  Filled 2021-12-13: qty 90, 90d supply, fill #1
  Filled 2022-03-04: qty 90, 90d supply, fill #2

## 2021-05-19 MED ORDER — HYDROCHLOROTHIAZIDE 25 MG PO TABS
ORAL_TABLET | Freq: Every day | ORAL | 1 refills | Status: DC
  Filled 2021-05-19 – 2021-06-11 (×2): qty 90, 90d supply, fill #0
  Filled 2021-09-17: qty 90, 90d supply, fill #1

## 2021-05-19 MED ORDER — PRAVASTATIN SODIUM 20 MG PO TABS
ORAL_TABLET | ORAL | 1 refills | Status: DC
Start: 2021-05-19 — End: 2021-12-04
  Filled 2021-05-19 – 2021-06-11 (×2): qty 90, 90d supply, fill #0

## 2021-05-19 NOTE — Assessment & Plan Note (Signed)
Under good control on current regimen. Continue current regimen. Continue to monitor. Call with any concerns. Refills given. Labs drawn today.   

## 2021-05-19 NOTE — Progress Notes (Signed)
BP 114/76   Pulse 62   Temp (!) 97.5 F (36.4 C)   Wt 285 lb 9.6 oz (129.5 kg)   SpO2 97%   BMI 49.55 kg/m    Subjective:    Patient ID: Latoya Lutz, female    DOB: [birth year], 50 y.o.   MRN: [id number]  HPI: Latoya Lutz is a 50 y.o. female  Chief Complaint  Patient presents with   Hypertension   Hyperlipidemia   Anxiety    HYPERTENSION / HYPERLIPIDEMIA Satisfied with current treatment? yes Duration of hypertension: chronic BP monitoring frequency: not checking BP medication side effects: no Duration of hyperlipidemia: chronic Cholesterol medication side effects: no Cholesterol supplements: none Past cholesterol medications: zetia, pravastatin Medication compliance: excellent compliance Aspirin: no Recent stressors: no Recurrent headaches: no Visual changes: no Palpitations: no Dyspnea: no Chest pain: no Lower extremity edema: no Dizzy/lightheaded: no  ANXIETY/STRESS Duration:controlled Anxious mood: yes  Excessive worrying: no Irritability: no  Sweating: no Nausea: yes Palpitations:no Hyperventilation: no Panic attacks: no Agoraphobia: no  Obscessions/compulsions: no Depressed mood: yes Depression screen [facility] 2/9 11/01/2020 10/15/2019 09/29/2018 08/12/2017 08/08/2016  Decreased Interest 0 0 0 0 0  Down, Depressed, Hopeless 0 0 0 0 0  PHQ - 2 Score 0 0 0 0 0  Altered sleeping - - 0 0 -  Tired, decreased energy - - 0 0 -  Change in appetite - - 0 0 -  Feeling bad or failure about yourself  - - 0 0 -  Trouble concentrating - - 0 0 -  Moving slowly or fidgety/restless - - 0 0 -  Suicidal thoughts - - 0 0 -  PHQ-9 Score - - 0 0 -  Difficult doing work/chores - - - Not difficult at all -  Some recent data might be hidden   Anhedonia: no Weight changes: no Insomnia: no   Hypersomnia: no Fatigue/loss of energy: yes Feelings of worthlessness: no Feelings of guilt: no Impaired concentration/indecisiveness: no Suicidal ideations: no   Crying spells: no Recent Stressors/Life Changes: no   Relationship problems: no   Family stress: no     Financial stress: no    Job stress: no    Recent death/loss: no  GERD GERD control status: exacerbated Satisfied with current treatment? no Heartburn frequency: daily Medication side effects: no  Medication compliance: excellent Previous GERD medications: pantoprazole Dysphagia: no Odynophagia:  no Hematemesis: no Blood in stool: no EGD: no  Relevant past medical, surgical, family and social history reviewed and updated as indicated. Interim medical history since our last visit reviewed. Allergies and medications reviewed and updated.  Review of Systems  Constitutional: Negative.   Respiratory: Negative.    Cardiovascular: Negative.   Gastrointestinal:  Positive for abdominal pain and nausea. Negative for abdominal distention, anal bleeding, blood in stool, constipation, diarrhea, rectal pain and vomiting.  Musculoskeletal: Negative.   Skin: Negative.   Neurological: Negative.   Psychiatric/Behavioral: Negative.     Per HPI unless specifically indicated above     Objective:    BP 114/76   Pulse 62   Temp (!) 97.5 F (36.4 C)   Wt 285 lb 9.6 oz (129.5 kg)   SpO2 97%   BMI 49.55 kg/m   Wt Readings from Last 3 Encounters:  05/19/21 285 lb 9.6 oz (129.5 kg)  02/21/21 285 lb 14.4 oz (129.7 kg)  01/26/21 277 lb 4 oz (125.8 kg)    Physical Exam Vitals and nursing note reviewed.  Constitutional:  General: She is not in acute distress.    Appearance: Normal appearance. She is obese. She is not ill-appearing, toxic-appearing or diaphoretic.  HENT:     Head: Normocephalic and atraumatic.     Right Ear: External ear normal.     Left Ear: External ear normal.     Nose: Nose normal.     Mouth/Throat:     Mouth: Mucous membranes are moist.     Pharynx: Oropharynx is clear.  Eyes:     General: No scleral icterus.       Right eye: No discharge.        Left  eye: No discharge.     Extraocular Movements: Extraocular movements intact.     Conjunctiva/sclera: Conjunctivae normal.     Pupils: Pupils are equal, round, and reactive to light.  Cardiovascular:     Rate and Rhythm: Normal rate and regular rhythm.     Pulses: Normal pulses.     Heart sounds: Normal heart sounds. No murmur heard.   No friction rub. No gallop.  Pulmonary:     Effort: Pulmonary effort is normal. No respiratory distress.     Breath sounds: Normal breath sounds. No stridor. No wheezing, rhonchi or rales.  Chest:     Chest wall: No tenderness.  Musculoskeletal:        General: Normal range of motion.     Cervical back: Normal range of motion and neck supple.  Skin:    General: Skin is warm and dry.     Capillary Refill: Capillary refill takes less than 2 seconds.     Coloration: Skin is not jaundiced or pale.     Findings: No bruising, erythema, lesion or rash.  Neurological:     General: No focal deficit present.     Mental Status: She is alert and oriented to person, place, and time. Mental status is at baseline.  Psychiatric:        Mood and Affect: Mood normal.        Behavior: Behavior normal.        Thought Content: Thought content normal.        Judgment: Judgment normal.    Results for orders placed or performed in visit on 02/21/21  Microscopic Examination   Urine  Result Value Ref Range   WBC, UA 0-5 0 - 5 /hpf   RBC 0-2 0 - 2 /hpf   Epithelial Cells (non renal) 0-10 0 - 10 /hpf   Bacteria, UA None seen None seen/Few  Urinalysis, Complete  Result Value Ref Range   Specific Gravity, UA 1.015 1.005 - 1.030   pH, UA 6.0 5.0 - 7.5   Color, UA Yellow Yellow   Appearance Ur Clear Clear   Leukocytes,UA Negative Negative   Protein,UA Negative Negative/Trace   Glucose, UA Negative Negative   Ketones, UA Negative Negative   RBC, UA Negative Negative   Bilirubin, UA Negative Negative   Urobilinogen, Ur 0.2 0.2 - 1.0 mg/dL   Nitrite, UA Negative  Negative   Microscopic Examination See below:       Assessment & Plan:   Problem List Items Addressed This Visit       Cardiovascular and Mediastinum   Essential hypertension - Primary    Under good control on current regimen. Continue current regimen. Continue to monitor. Call with any concerns. Refills given. Labs drawn today.        Relevant Medications   ezetimibe (ZETIA) 10 MG tablet   hydrochlorothiazide (  HYDRODIURIL) 25 MG tablet   pravastatin (PRAVACHOL) 20 MG tablet   Other Relevant Orders   CBC with Differential/Platelet   Comprehensive metabolic panel     Other   Acute anxiety    Under good control on current regimen. Continue current regimen. Continue to monitor. Call with any concerns. Refills given. Labs drawn today.        Relevant Medications   busPIRone (BUSPAR) 10 MG tablet   Other Relevant Orders   CBC with Differential/Platelet   Comprehensive metabolic panel   Hyperlipidemia    Under good control on current regimen. Continue current regimen. Continue to monitor. Call with any concerns. Refills given. Labs drawn today.        Relevant Medications   ezetimibe (ZETIA) 10 MG tablet   hydrochlorothiazide (HYDRODIURIL) 25 MG tablet   pravastatin (PRAVACHOL) 20 MG tablet   Other Relevant Orders   CBC with Differential/Platelet   Comprehensive metabolic panel   Lipid Panel w/o Chol/HDL Ratio   Other Visit Diagnoses     Gastroesophageal reflux disease       Relevant Medications   pantoprazole (PROTONIX) 40 MG tablet   sucralfate (CARAFATE) 1 g tablet   Other Relevant Orders   Ambulatory referral to Gastroenterology   Screening for colon cancer       Referral to GI placed today.    Relevant Orders   Ambulatory referral to Gastroenterology        Follow up plan: Return in about 6 months (around 11/17/2021) for physical.

## 2021-05-20 LAB — COMPREHENSIVE METABOLIC PANEL
ALT: 11 IU/L (ref 0–32)
AST: 12 IU/L (ref 0–40)
Albumin/Globulin Ratio: 1.8 (ref 1.2–2.2)
Albumin: 4.2 g/dL (ref 3.8–4.9)
Alkaline Phosphatase: 107 IU/L (ref 44–121)
BUN/Creatinine Ratio: 16 (ref 9–23)
BUN: 12 mg/dL (ref 6–24)
Bilirubin Total: 0.3 mg/dL (ref 0.0–1.2)
CO2: 29 mmol/L (ref 20–29)
Calcium: 9.9 mg/dL (ref 8.7–10.2)
Chloride: 99 mmol/L (ref 96–106)
Creatinine, Ser: 0.74 mg/dL (ref 0.57–1.00)
Globulin, Total: 2.3 g/dL (ref 1.5–4.5)
Glucose: 91 mg/dL (ref 70–99)
Potassium: 3.8 mmol/L (ref 3.5–5.2)
Sodium: 141 mmol/L (ref 134–144)
Total Protein: 6.5 g/dL (ref 6.0–8.5)
eGFR: 98 mL/min/{1.73_m2} (ref >59)

## 2021-05-20 LAB — CBC WITH DIFFERENTIAL/PLATELET
Basophils Absolute: 0 10*3/uL (ref 0.0–0.2)
Basos: 0 %
EOS (ABSOLUTE): 0.2 10*3/uL (ref 0.0–0.4)
Eos: 2 %
Hematocrit: 38.1 % (ref 34.0–46.6)
Hemoglobin: 12.6 g/dL (ref 11.1–15.9)
Immature Grans (Abs): 0 10*3/uL (ref 0.0–0.1)
Immature Granulocytes: 0 %
Lymphocytes Absolute: 3.2 10*3/uL — ABNORMAL HIGH (ref 0.7–3.1)
Lymphs: 40 %
MCH: 26.6 pg (ref 26.6–33.0)
MCHC: 33.1 g/dL (ref 31.5–35.7)
MCV: 81 fL (ref 79–97)
Monocytes Absolute: 0.5 10*3/uL (ref 0.1–0.9)
Monocytes: 6 %
Neutrophils Absolute: 4.1 10*3/uL (ref 1.4–7.0)
Neutrophils: 52 %
Platelets: 275 10*3/uL (ref 150–450)
RBC: 4.73 x10E6/uL (ref 3.77–5.28)
RDW: 13.9 % (ref 11.7–15.4)
WBC: 7.9 10*3/uL (ref 3.4–10.8)

## 2021-05-20 LAB — LIPID PANEL W/O CHOL/HDL RATIO
Cholesterol, Total: 171 mg/dL (ref 100–199)
HDL: 51 mg/dL (ref >39)
LDL Chol Calc (NIH): 92 mg/dL (ref 0–99)
Triglycerides: 163 mg/dL — ABNORMAL HIGH (ref 0–149)
VLDL Cholesterol Cal: 28 mg/dL (ref 5–40)

## 2021-05-24 ENCOUNTER — Other Ambulatory Visit: Payer: Self-pay

## 2021-06-07 DIAGNOSIS — H524 Presbyopia: Secondary | ICD-10-CM | POA: Diagnosis not present

## 2021-06-12 ENCOUNTER — Other Ambulatory Visit: Payer: Self-pay

## 2021-06-13 ENCOUNTER — Other Ambulatory Visit: Payer: Self-pay

## 2021-07-13 ENCOUNTER — Ambulatory Visit: Payer: 59 | Admitting: Gastroenterology

## 2021-07-18 ENCOUNTER — Other Ambulatory Visit: Payer: Self-pay

## 2021-07-18 ENCOUNTER — Ambulatory Visit (INDEPENDENT_AMBULATORY_CARE_PROVIDER_SITE_OTHER): Payer: 59 | Admitting: Gastroenterology

## 2021-07-18 ENCOUNTER — Encounter: Payer: Self-pay | Admitting: Gastroenterology

## 2021-07-18 VITALS — BP 122/76 | HR 70 | Temp 98.3°F | Ht 64.0 in | Wt 289.0 lb

## 2021-07-18 DIAGNOSIS — R109 Unspecified abdominal pain: Secondary | ICD-10-CM | POA: Diagnosis not present

## 2021-07-18 DIAGNOSIS — R194 Change in bowel habit: Secondary | ICD-10-CM

## 2021-07-18 MED ORDER — NA SULFATE-K SULFATE-MG SULF 17.5-3.13-1.6 GM/177ML PO SOLN
1.0000 | Freq: Once | ORAL | 0 refills | Status: AC
  Filled 2021-07-18 – 2021-08-22 (×2): qty 354, 1d supply, fill #0

## 2021-07-18 NOTE — Progress Notes (Signed)
Gastroenterology Consultation  Referring Provider:     Valerie Roys, DO Primary Care Physician:  Valerie Roys, DO Primary Gastroenterologist:  Dr. Allen Norris     Reason for Consultation:     GERD and Change in bowel habits        HPI:   Latoya Lutz is a 51 y.o. y/o female referred for consultation & management of GERD by Dr. Wynetta Emery, Megan P, DO.  This patient comes in today with a history of GERD that she reports not being responsive to her medication. She reports she avoid spicy food and takes Protonix twice a day and takes 15 Tums a week. Many food bother her stomach. She was more diarrhea in the past but now more constipated. She now has problems with feeling sick and abdominal pain. She says she lost weight but gained it back. She says t is from the flavored coffee she is drinking. She was 285 in September. The patient reports that she had some weight loss and back in August she was 277 pounds and one month later she was back up to 285.  She attributes this to drinking sugary flavored coffee. There is no report of any nausea vomiting black stools or bloody stools.  She states that she has been having more problems with meat causing her abdominal discomfort. She denies any history of tick bites that she can recall.  Past Medical History:  Diagnosis Date   Acid reflux    Actinic keratosis    Arthritis    Atypical mole 10/27/2019   Left labia mucosa    Atypical nevus 05/25/2020   R labia majora - atypical proliferation   Candidal dermatitis 05/25/2014   Cervical spine disease    Chicken pox    Chronic urethral narrowing    undergoing stretching   Complication of anesthesia    Dysplastic nevus 04/05/2021   R post med thigh near popliteal, mod to severe atypia   Endometriosis    Dr. Rushie Chestnut at [facility], removed    Esophageal reflux 05/25/2014   Gross hematuria 04/25/2015   H/O cystoscopy    normal   Heart murmur    Hx of basal cell carcinoma 12/29/2008   L low back    Infection of urinary tract 09/11/2014   Kidney stone 09/11/2014   Kidney stones    Microscopic hematuria 09/11/2014   Obesity, morbid, BMI 40.0-49.9 ([place]) 04/10/2012   PONV (postoperative nausea and vomiting)    Right ureteral stone 04/28/2015   Skin cancer    Urinary retention     Past Surgical History:  Procedure Laterality Date   BLADDER SURGERY  07/2003   CARPAL TUNNEL RELEASE Right    CESAREAN SECTION  1998   CHOLECYSTECTOMY     CYSTOSCOPY W/ RETROGRADES Right 05/16/2015   Procedure: CYSTOSCOPY WITH RETROGRADE PYELOGRAM;  Surgeon: Nickie Retort, MD;  Location: [facility];  Service: Urology;  Laterality: Right;   CYSTOSCOPY W/ URETERAL STENT PLACEMENT Right 05/16/2015   Procedure: CYSTOSCOPY WITH STENT REPLACEMENT;  Surgeon: Nickie Retort, MD;  Location: [facility];  Service: Urology;  Laterality: Right;   CYSTOSCOPY WITH STENT PLACEMENT Right 04/29/2015   Procedure: CYSTOSCOPY WITH STENT PLACEMENT;  Surgeon: Nickie Retort, MD;  Location: [facility];  Service: Urology;  Laterality: Right;   EXTRACORPOREAL SHOCK WAVE LITHOTRIPSY Left 06/02/2015   Procedure: EXTRACORPOREAL SHOCK WAVE LITHOTRIPSY (ESWL);  Surgeon: Hollice Espy, MD;  Location: [facility];  Service: Urology;  Laterality: Left;   FOOT  SURGERY  2003   KNEE SURGERY  2005   TONSILLECTOMY     TONSILLECTOMY     lingual growth on vocal cord removal   TOTAL ABDOMINAL HYSTERECTOMY W/ BILATERAL SALPINGOOPHORECTOMY     [facility] complete   URETEROSCOPY WITH HOLMIUM LASER LITHOTRIPSY Right 05/16/2015   Procedure: URETEROSCOPY WITH HOLMIUM LASER LITHOTRIPSY;  Surgeon: Nickie Retort, MD;  Location: [facility];  Service: Urology;  Laterality: Right;    Prior to Admission medications   Medication Sig Start Date End Date Taking? Authorizing Provider  acyclovir (ZOVIRAX) 400 MG tablet Take 1 tablet (400 mg total) by mouth 3 (three) times daily. 11/01/20  Yes Johnson, Megan P, DO  albuterol (VENTOLIN HFA) 108 (90 Base)  MCG/ACT inhaler INHALE 2 PUFFS BY MOUTH EVERY 6 HOURS AS NEEDED FOR WHEEZING OR SHORTNESS OF BREATH. 11/01/20 11/01/21 Yes Johnson, Megan P, DO  busPIRone (BUSPAR) 10 MG tablet Take 1 tablet (10 mg total) by mouth 3 (three) times daily. 05/19/21  Yes Johnson, Megan P, DO  Calcium Carbonate (CALCIUM 600 PO) Take 1 tablet by mouth daily.    Yes [provider]  cyclobenzaprine (FLEXERIL) 10 MG tablet Take 1 tablet (10 mg total) by mouth at bedtime. 05/19/21  Yes Johnson, Megan P, DO  diclofenac Sodium (VOLTAREN) 1 % GEL APPLY 2 GRAMS TO THE AFFECTED AREA(S) 4 TIMES A DAY 05/16/21 05/16/22 Yes Johnson, Megan P, DO  ezetimibe (ZETIA) 10 MG tablet TAKE 1 TABLET (10 MG TOTAL) BY MOUTH DAILY. 05/19/21 05/19/22 Yes Johnson, Megan P, DO  fexofenadine (ALLEGRA) 180 MG tablet Take 180 mg by mouth daily.   Yes [provider]  fluticasone (FLONASE) 50 MCG/ACT nasal spray PLACE 2 SPRAYS INTO BOTH NOSTRILS DAILY. 11/01/20 11/01/21 Yes Johnson, Megan P, DO  hydrochlorothiazide (HYDRODIURIL) 25 MG tablet TAKE 1 TABLET BY MOUTH DAILY. 05/19/21 05/19/22 Yes Johnson, Megan P, DO  meloxicam (MOBIC) 15 MG tablet TAKE 1 TABLET BY MOUTH DAILY. 05/19/21 05/19/22 Yes Johnson, Megan P, DO  Nutritional Supplements (MENOPAUSE FORMULA) TABS Take by mouth. Reported on 11/04/2015   Yes [provider]  pantoprazole (PROTONIX) 40 MG tablet Take 2 tablets (80 mg total) by mouth daily. 05/19/21 05/19/22 Yes Johnson, Megan P, DO  pravastatin (PRAVACHOL) 20 MG tablet TAKE 1 TABLET BY MOUTH DAILY 05/19/21  Yes Johnson, Megan P, DO  pseudoephedrine (SUDAFED) 30 MG tablet Take 30 mg by mouth 3 (three) times daily.   Yes [provider]  Simethicone 180 MG CAPS Take 1 capsule (180 mg total) by mouth 3 (three) times daily with meals. 03/05/16  Yes Johnson, Megan P, DO  sucralfate (CARAFATE) 1 g tablet TAKE 1 TABLET BY MOUTH 4 TIMES DAILY - WITH MEALS AND AT BEDTIME. 05/19/21 05/19/22 Yes Johnson, Megan P, DO  UNIFINE PENTIPS  31G X 8 MM MISC  10/23/19  Yes [provider]  vitamin E 400 UNIT capsule Take 400 Units by mouth daily.   Yes [provider]  azelastine (ASTELIN) 0.1 % nasal spray PLACE 1 SPRAY INTO BOTH NOSTRILS 2 TIMES DAILY. 12/30/19 12/29/20  Venita Lick, NP    Family History  Problem Relation Age of Onset   Cancer Mother        pancreatic cancer   Heart disease Mother        Pacemaker and defib   Hypertension Mother    Hyperlipidemia Mother    Cancer Father    COPD Father        Lung and brain cancer  Cancer Maternal Grandmother        Colon   Diabetes Paternal Grandmother    Cirrhosis Paternal Grandmother    Cancer Paternal Grandmother    Kidney disease Neg Hx    Bladder Cancer Neg Hx    Breast cancer Neg Hx      Social History   Tobacco Use   Smoking status: Former    Packs/day: 0.50    Years: 17.00    Pack years: 8.50    Types: Cigarettes    Quit date: 04/27/2008    Years since quitting: 13.2   Smokeless tobacco: Never   Tobacco comments:    quit 7 years   Vaping Use   Vaping Use: Never used  Substance Use Topics   Alcohol use: No    Alcohol/week: 0.0 standard drinks   Drug use: No    Allergies as of 07/18/2021 - Review Complete 07/18/2021  Allergen Reaction Noted   Atorvastatin Other (See Comments) 09/17/2016    Review of Systems:    All systems reviewed and negative except where noted in HPI.   Physical Exam:  BP 122/76    Pulse 70    Temp 98.3 F (36.8 C) (Oral)    Ht 5\' 4"  (1.626 m)    Wt 289 lb (131.1 kg)    BMI 49.61 kg/m  No LMP recorded. Patient has had a hysterectomy. General:   Alert,  Well-developed, well-nourished, pleasant and cooperative in NAD Head:  Normocephalic and atraumatic. Eyes:  Sclera clear, no icterus.   Conjunctiva pink. Ears:  Normal auditory acuity. Neck:  Supple; no masses or thyromegaly. Lungs:  Respirations even and unlabored.  Clear throughout to auscultation.   No wheezes, crackles, or rhonchi. No  acute distress. Heart:  Regular rate and rhythm; no murmurs, clicks, rubs, or gallops. Abdomen:  Normal bowel sounds.  No bruits.  Soft, non-tender and non-distended without masses, hepatosplenomegaly or hernias noted.  No guarding or rebound tenderness.  Negative Carnett sign.   Rectal:  Deferred.  Pulses:  Normal pulses noted. Extremities:  No clubbing or edema.  No cyanosis. Neurologic:  Alert and oriented x3;  grossly normal neurologically. Skin:  Intact without significant lesions or rashes.  No jaundice. Lymph Nodes:  No significant cervical adenopathy. Psych:  Alert and cooperative. Normal mood and affect.  Imaging Studies: No results found.  Assessment and Plan:   Latoya Lutz is a 51 y.o. y/o female who comes in today with a change in bowel habits from predominantly diarrhea in the past to constipation.  The patient has also been having abdominal discomfort and she reports inability to eat but has maintained her weight and even gained weight that she reports to be due to her liquid intake. The patient has never had a colonoscopy in the past.  Due to the patient's change in bowel habits and abdominal discomfort the patient will be set up for blood work to look for out for gout and she'll have blood work sent for celiac sprue.  The patient will also be set up for a EGD and colonoscopy.  The patient has been told that if she continues to have heartburn on a double dose PPI and 15 times per week her symptoms may not because by reflux there may be something else going on here.  She may need a pH study in the future.  The patient has been explained the plan and agrees with it.    Lucilla Lame, MD. Marval Regal    Note:  This dictation was prepared with Dragon dictation along with smaller phrase technology. Any transcriptional errors that result from this process are unintentional.

## 2021-07-23 LAB — ALPHA-GAL PANEL
Allergen Lamb IgE: <0.1 kU/L
Beef IgE: <0.1 kU/L
IgE (Immunoglobulin E), Serum: 31 IU/mL (ref 6–495)
O215-IgE Alpha-Gal: <0.1 kU/L
Pork IgE: <0.1 kU/L

## 2021-07-23 LAB — CELIAC DISEASE PANEL
Endomysial IgA: NEGATIVE
IgA/Immunoglobulin A, Serum: 132 mg/dL (ref 87–352)
Transglutaminase IgA: <2 U/mL (ref 0–3)

## 2021-07-27 NOTE — Progress Notes (Signed)
08/10/21 8:17 AM   Latoya Lutz [name] 24-Oct-1970 [id number]  Referring provider:  Dorcas Carrow, DO [address] [place],  [place] [address]  Chief Complaint  Patient presents with   Urethral Stricture    Urological history  1. Urethral stricture - diagnosed by Dr. Leonette Monarch with urethral dilation and had been receiving serial dilations every 4 to 6 months since 2013 - managed with urethral dilation q 6 months at this time   2. Nephrolithiasis - right ureteral stones treated with ESWL and right URS/LL/right ureteral stent placement and stent removal in winter 2016 - CT Renal stone study performed on 04/05/2017 noted no nephroureterolithiasis.  No hydronephrosis   HPI: Latoya Lutz is a 51 y.o.female  who presents today for urethral dilation.    Patient denies any modifying or aggravating factors.  Patient denies any gross hematuria, dysuria or suprapubic/flank pain.  Patient denies any fevers, chills, nausea or vomiting.    PMH: Past Medical History:  Diagnosis Date   Acid reflux    Actinic keratosis    Arthritis    Atypical mole 10/27/2019   Left labia mucosa    Atypical nevus 05/25/2020   R labia majora - atypical proliferation   Candidal dermatitis 05/25/2014   Cervical spine disease    Chicken pox    Chronic urethral narrowing    undergoing stretching   Complication of anesthesia    Dysplastic nevus 04/05/2021   R post med thigh near popliteal, mod to severe atypia   Endometriosis    Dr. Gerrie Nordmann at [facility], removed    Esophageal reflux 05/25/2014   Gross hematuria 04/25/2015   H/O cystoscopy    normal   Heart murmur    Hx of basal cell carcinoma 12/29/2008   L low back   Infection of urinary tract 09/11/2014   Kidney stone 09/11/2014   Kidney stones    Microscopic hematuria 09/11/2014   Obesity, morbid, BMI 40.0-49.9 (HCC) 04/10/2012   PONV (postoperative nausea and vomiting)    Right ureteral stone 04/28/2015   Skin cancer    Urinary retention      Surgical History: Past Surgical History:  Procedure Laterality Date   BLADDER SURGERY  07/2003   CARPAL TUNNEL RELEASE Right    CESAREAN SECTION  1998   CHOLECYSTECTOMY     CYSTOSCOPY W/ RETROGRADES Right 05/16/2015   Procedure: CYSTOSCOPY WITH RETROGRADE PYELOGRAM;  Surgeon: Hildred Laser, MD;  Location: [facility];  Service: Urology;  Laterality: Right;   CYSTOSCOPY W/ URETERAL STENT PLACEMENT Right 05/16/2015   Procedure: CYSTOSCOPY WITH STENT REPLACEMENT;  Surgeon: Hildred Laser, MD;  Location: [facility];  Service: Urology;  Laterality: Right;   CYSTOSCOPY WITH STENT PLACEMENT Right 04/29/2015   Procedure: CYSTOSCOPY WITH STENT PLACEMENT;  Surgeon: Hildred Laser, MD;  Location: [facility];  Service: Urology;  Laterality: Right;   EXTRACORPOREAL SHOCK WAVE LITHOTRIPSY Left 06/02/2015   Procedure: EXTRACORPOREAL SHOCK WAVE LITHOTRIPSY (ESWL);  Surgeon: Vanna Scotland, MD;  Location: [facility];  Service: Urology;  Laterality: Left;   FOOT SURGERY  2003   KNEE SURGERY  2005   TONSILLECTOMY     TONSILLECTOMY     lingual growth on vocal cord removal   TOTAL ABDOMINAL HYSTERECTOMY W/ BILATERAL SALPINGOOPHORECTOMY     [facility] complete   URETEROSCOPY WITH HOLMIUM LASER LITHOTRIPSY Right 05/16/2015   Procedure: URETEROSCOPY WITH HOLMIUM LASER LITHOTRIPSY;  Surgeon: Hildred Laser, MD;  Location: [facility];  Service: Urology;  Laterality: Right;  Home Medications:  Allergies as of 07/28/2021       Reactions   Atorvastatin Other (See Comments)   Leg cramps        Medication List        Accurate as of July 28, 2021 11:59 PM. If you have any questions, ask your nurse or doctor.          acyclovir 400 MG tablet Commonly known as: ZOVIRAX Take 1 tablet (400 mg total) by mouth 3 (three) times daily.   albuterol 108 (90 Base) MCG/ACT inhaler Commonly known as: VENTOLIN HFA INHALE 2 PUFFS BY MOUTH EVERY 6 HOURS AS NEEDED FOR WHEEZING OR SHORTNESS OF BREATH.    azelastine 0.1 % nasal spray Commonly known as: ASTELIN PLACE 1 SPRAY INTO BOTH NOSTRILS 2 TIMES DAILY.   busPIRone 10 MG tablet Commonly known as: BUSPAR Take 1 tablet (10 mg total) by mouth 3 (three) times daily.   CALCIUM 600 PO Take 1 tablet by mouth daily.   cyclobenzaprine 10 MG tablet Commonly known as: FLEXERIL Take 1 tablet (10 mg total) by mouth at bedtime.   diclofenac Sodium 1 % Gel Commonly known as: VOLTAREN APPLY 2 GRAMS TO THE AFFECTED AREA(S) 4 TIMES A DAY   ezetimibe 10 MG tablet Commonly known as: ZETIA TAKE 1 TABLET (10 MG TOTAL) BY MOUTH DAILY.   fexofenadine 180 MG tablet Commonly known as: ALLEGRA Take 180 mg by mouth daily.   fluticasone 50 MCG/ACT nasal spray Commonly known as: FLONASE PLACE 2 SPRAYS INTO BOTH NOSTRILS DAILY.   hydrochlorothiazide 25 MG tablet Commonly known as: HYDRODIURIL TAKE 1 TABLET BY MOUTH DAILY.   meloxicam 15 MG tablet Commonly known as: MOBIC TAKE 1 TABLET BY MOUTH DAILY.   Menopause Formula Tabs Take by mouth. Reported on 11/04/2015   pantoprazole 40 MG tablet Commonly known as: PROTONIX Take 2 tablets (80 mg total) by mouth daily.   pravastatin 20 MG tablet Commonly known as: PRAVACHOL TAKE 1 TABLET BY MOUTH DAILY   pseudoephedrine 30 MG tablet Commonly known as: SUDAFED Take 30 mg by mouth 3 (three) times daily.   Simethicone 180 MG Caps Take 1 capsule (180 mg total) by mouth 3 (three) times daily with meals.   sucralfate 1 g tablet Commonly known as: CARAFATE TAKE 1 TABLET BY MOUTH 4 TIMES DAILY - WITH MEALS AND AT BEDTIME.   Unifine Pentips 31G X 8 MM Misc Generic drug: Insulin Pen Needle   vitamin E 180 MG (400 UNITS) capsule Take 400 Units by mouth daily.        Allergies:  Allergies  Allergen Reactions   Atorvastatin Other (See Comments)    Leg cramps    Family History: Family History  Problem Relation Age of Onset   Cancer Mother        pancreatic cancer   Heart disease  Mother        Pacemaker and defib   Hypertension Mother    Hyperlipidemia Mother    Cancer Father    COPD Father        Lung and brain cancer   Cancer Maternal Grandmother        Colon   Diabetes Paternal Grandmother    Cirrhosis Paternal Grandmother    Cancer Paternal Grandmother    Kidney disease Neg Hx    Bladder Cancer Neg Hx    Breast cancer Neg Hx     Social History:  reports that she quit smoking about 13 years ago. Her smoking  use included cigarettes. She has a 8.50 pack-year smoking history. She has never used smokeless tobacco. She reports that she does not drink alcohol and does not use drugs.   Physical Exam: Constitutional:  Well nourished. Alert and oriented, No acute distress. HEENT: [place] AT, mask in place.  Trachea midline Cardiovascular: No clubbing, cyanosis, or edema. Respiratory: Normal respiratory effort, no increased work of breathing. Neurologic: Grossly intact, no focal deficits, moving all 4 extremities. Psychiatric: Normal mood and affect.    Laboratory Data: Lab Results  Component Value Date   CREATININE 0.74 05/19/2021   Lab Results  Component Value Date   HGBA1C 5.7 11/01/2020   Component     Latest Ref Rng & Units 05/19/2021  Cholesterol, Total     100 - 199 mg/dL 738  Triglycerides     0 - 149 mg/dL 094 (H)  HDL Cholesterol     >39 mg/dL 51  VLDL Cholesterol Cal     5 - 40 mg/dL 28  LDL Chol Calc (NIH)     0 - 99 mg/dL 92   Component     Latest Ref Rng & Units 05/19/2021  Glucose     70 - 99 mg/dL 91  BUN     6 - 24 mg/dL 12  Creatinine     7.01 - 1.00 mg/dL 1.04  eGFR     >38 VT/EXP/5.30 98  BUN/Creatinine Ratio     9 - 23 16  Sodium     134 - 144 mmol/L 141  Potassium     3.5 - 5.2 mmol/L 3.8  Chloride     96 - 106 mmol/L 99  CO2     20 - 29 mmol/L 29  Calcium     8.7 - 10.2 mg/dL 9.9  Total Protein     6.0 - 8.5 g/dL 6.5  Albumin     3.8 - 4.9 g/dL 4.2  Globulin, Total     1.5 - 4.5 g/dL 2.3  Albumin/Globulin  Ratio     1.2 - 2.2 1.8  Total Bilirubin     0.0 - 1.2 mg/dL 0.3  Alkaline Phosphatase     44 - 121 IU/L 107  AST     0 - 40 IU/L 12  ALT     0 - 32 IU/L 11   Component     Latest Ref Rng & Units 05/19/2021          WBC     3.4 - 10.8 x10E3/uL 7.9  RBC     3.77 - 5.28 x10E6/uL 4.73  Hemoglobin     11.1 - 15.9 g/dL 45.9  HCT     66.2 - 32.5 % 38.1  MCV     79 - 97 fL 81  MCH     26.6 - 33.0 pg 26.6  MCHC     31.5 - 35.7 g/dL 41.7  RDW     36.1 - 38.0 % 13.9  Platelets     150 - 450 x10E3/uL 275  nRBC     0.0 - 0.2 %   Neutrophils     Not Estab. % 52  NEUT#     1.4 - 7.0 x10E3/uL 4.1  Lymphocytes     %   Lymphocyte #     0.7 - 3.1 x10E3/uL 3.2 (H)   Pertinent Imaging/ Urinalysis: Results for orders placed or performed in visit on 07/28/21  Microscopic Examination   Urine  Result Value Ref Range  WBC, UA 0-5 0 - 5 /hpf   RBC None seen 0 - 2 /hpf   Epithelial Cells (non renal) 0-10 0 - 10 /hpf   Bacteria, UA None seen None seen/Few  Urinalysis, Complete  Result Value Ref Range   Specific Gravity, UA 1.010 1.005 - 1.030   pH, UA 6.0 5.0 - 7.5   Color, UA Yellow Yellow   Appearance Ur Clear Clear   Leukocytes,UA Negative Negative   Protein,UA Negative Negative/Trace   Glucose, UA Negative Negative   Ketones, UA Negative Negative   RBC, UA Negative Negative   Bilirubin, UA Negative Negative   Urobilinogen, Ur 0.2 0.2 - 1.0 mg/dL   Nitrite, UA Negative Negative   Microscopic Examination See below:     Procedure  Patient is placed in stirrups and her urethral meatus and vulva are cleansed with Betadine. 2% Lidocaine jelly was inserted into her urethra.  I then dilated her with Elby Showers sounds to a 30 without difficultly.  She tolerated the procedure well.  She will return in 5 months.    Assessment & Plan:   1. Urethral stricture -tolerated well - Return in about 5 months (around 12/25/2021) for urethral dilation.  No follow-ups on  file.  [place] [address] [place], [place] [address] [phone]  [facility] as a scribe for [facility], PA-C.,have documented all relevant documentation on the behalf of Cebastian Neis, PA-C,as directed by  [facility] PA-C while in the presence of Raenah Murley, PA-C.

## 2021-07-28 ENCOUNTER — Ambulatory Visit (INDEPENDENT_AMBULATORY_CARE_PROVIDER_SITE_OTHER): Payer: 59 | Admitting: Urology

## 2021-07-28 ENCOUNTER — Other Ambulatory Visit: Payer: Self-pay

## 2021-07-28 DIAGNOSIS — N3592 Unspecified urethral stricture, female: Secondary | ICD-10-CM | POA: Diagnosis not present

## 2021-07-28 LAB — URINALYSIS, COMPLETE
Bilirubin, UA: NEGATIVE
Glucose, UA: NEGATIVE
Ketones, UA: NEGATIVE
Leukocytes,UA: NEGATIVE
Nitrite, UA: NEGATIVE
Protein,UA: NEGATIVE
RBC, UA: NEGATIVE
Specific Gravity, UA: 1.01 (ref 1.005–1.030)
Urobilinogen, Ur: 0.2 mg/dL (ref 0.2–1.0)
pH, UA: 6 (ref 5.0–7.5)

## 2021-07-28 LAB — MICROSCOPIC EXAMINATION
Bacteria, UA: NONE SEEN
RBC, Urine: NONE SEEN /hpf (ref 0–2)

## 2021-07-31 ENCOUNTER — Other Ambulatory Visit: Payer: Self-pay

## 2021-08-17 ENCOUNTER — Other Ambulatory Visit: Payer: Self-pay

## 2021-08-17 ENCOUNTER — Ambulatory Visit (INDEPENDENT_AMBULATORY_CARE_PROVIDER_SITE_OTHER): Payer: 59

## 2021-08-17 DIAGNOSIS — Z23 Encounter for immunization: Secondary | ICD-10-CM | POA: Diagnosis not present

## 2021-08-22 ENCOUNTER — Other Ambulatory Visit: Payer: Self-pay

## 2021-08-23 ENCOUNTER — Encounter: Payer: Self-pay | Admitting: Gastroenterology

## 2021-08-23 ENCOUNTER — Ambulatory Visit: Payer: 59 | Admitting: Urology

## 2021-08-24 ENCOUNTER — Ambulatory Visit: Payer: 59 | Admitting: Anesthesiology

## 2021-08-24 ENCOUNTER — Ambulatory Visit
Admission: RE | Admit: 2021-08-24 | Discharge: 2021-08-24 | Disposition: A | Discharge status: Home / Self Care | Payer: 59 | Source: Ambulatory Visit | Attending: Gastroenterology | Admitting: Gastroenterology

## 2021-08-24 ENCOUNTER — Encounter
Admission: RE | Disposition: A | Discharge status: Home / Self Care | Payer: Self-pay | Source: Ambulatory Visit | Attending: Gastroenterology

## 2021-08-24 ENCOUNTER — Encounter: Payer: Self-pay | Admitting: Gastroenterology

## 2021-08-24 DIAGNOSIS — E785 Hyperlipidemia, unspecified: Secondary | ICD-10-CM | POA: Diagnosis not present

## 2021-08-24 DIAGNOSIS — Z87891 Personal history of nicotine dependence: Secondary | ICD-10-CM | POA: Insufficient documentation

## 2021-08-24 DIAGNOSIS — Z6841 Body Mass Index (BMI) 40.0 and over, adult: Secondary | ICD-10-CM | POA: Diagnosis not present

## 2021-08-24 DIAGNOSIS — K640 First degree hemorrhoids: Secondary | ICD-10-CM | POA: Diagnosis not present

## 2021-08-24 DIAGNOSIS — R12 Heartburn: Secondary | ICD-10-CM | POA: Diagnosis not present

## 2021-08-24 DIAGNOSIS — R194 Change in bowel habit: Secondary | ICD-10-CM | POA: Insufficient documentation

## 2021-08-24 DIAGNOSIS — K219 Gastro-esophageal reflux disease without esophagitis: Secondary | ICD-10-CM | POA: Diagnosis not present

## 2021-08-24 DIAGNOSIS — K621 Rectal polyp: Secondary | ICD-10-CM | POA: Insufficient documentation

## 2021-08-24 DIAGNOSIS — K2970 Gastritis, unspecified, without bleeding: Secondary | ICD-10-CM | POA: Diagnosis not present

## 2021-08-24 DIAGNOSIS — D122 Benign neoplasm of ascending colon: Secondary | ICD-10-CM | POA: Diagnosis not present

## 2021-08-24 DIAGNOSIS — K317 Polyp of stomach and duodenum: Secondary | ICD-10-CM | POA: Diagnosis not present

## 2021-08-24 DIAGNOSIS — K5730 Diverticulosis of large intestine without perforation or abscess without bleeding: Secondary | ICD-10-CM | POA: Insufficient documentation

## 2021-08-24 DIAGNOSIS — K2950 Unspecified chronic gastritis without bleeding: Secondary | ICD-10-CM | POA: Diagnosis not present

## 2021-08-24 DIAGNOSIS — K5790 Diverticulosis of intestine, part unspecified, without perforation or abscess without bleeding: Secondary | ICD-10-CM | POA: Diagnosis not present

## 2021-08-24 DIAGNOSIS — F419 Anxiety disorder, unspecified: Secondary | ICD-10-CM | POA: Insufficient documentation

## 2021-08-24 DIAGNOSIS — I10 Essential (primary) hypertension: Secondary | ICD-10-CM | POA: Diagnosis not present

## 2021-08-24 DIAGNOSIS — K635 Polyp of colon: Secondary | ICD-10-CM | POA: Diagnosis not present

## 2021-08-24 HISTORY — DX: Essential (primary) hypertension: I10

## 2021-08-24 HISTORY — PX: ESOPHAGOGASTRODUODENOSCOPY: SHX5428

## 2021-08-24 HISTORY — PX: COLONOSCOPY WITH PROPOFOL: SHX5780

## 2021-08-24 SURGERY — COLONOSCOPY WITH PROPOFOL
Anesthesia: General

## 2021-08-24 MED ORDER — PROPOFOL 500 MG/50ML IV EMUL
INTRAVENOUS | Status: DC | PRN
  Administered 2021-08-24: 150 ug/kg/min via INTRAVENOUS

## 2021-08-24 MED ORDER — LIDOCAINE HCL (CARDIAC) PF 100 MG/5ML IV SOSY
PREFILLED_SYRINGE | INTRAVENOUS | Status: DC | PRN
  Administered 2021-08-24: 50 mg via INTRAVENOUS

## 2021-08-24 MED ORDER — EPHEDRINE SULFATE (PRESSORS) 50 MG/ML IJ SOLN
INTRAMUSCULAR | Status: DC | PRN
  Administered 2021-08-24: 5 mg via INTRAVENOUS

## 2021-08-24 MED ORDER — SODIUM CHLORIDE 0.9 % IV SOLN: INTRAVENOUS | Status: DC

## 2021-08-24 MED ORDER — PROPOFOL 10 MG/ML IV BOLUS
INTRAVENOUS | Status: DC | PRN
  Administered 2021-08-24: 10 mg via INTRAVENOUS
  Administered 2021-08-24: 70 mg via INTRAVENOUS

## 2021-08-24 NOTE — H&P (Signed)
? ?Lucilla Lame, MD [facility] ?[address] ?[place], [place] [address] ?Phone:[phone] ?Fax : [phone] ? ?Primary Care Physician:  Valerie Roys, DO ?Primary Gastroenterologist:  Dr. Allen Norris ? ?Pre-Procedure History & Physical: ?HPI:  Latoya Lutz is a 51 y.o. female is here for an endoscopy and colonoscopy. ?  ?Past Medical History:  ?Diagnosis Date  ? Acid reflux   ? Actinic keratosis   ? Arthritis   ? Atypical mole 10/27/2019  ? Left labia mucosa   ? Atypical nevus 05/25/2020  ? R labia majora - atypical proliferation  ? Candidal dermatitis 05/25/2014  ? Cervical spine disease   ? Chicken pox   ? Chronic urethral narrowing   ? undergoing stretching  ? Complication of anesthesia   ? Dysplastic nevus 04/05/2021  ? R post med thigh near popliteal, mod to severe atypia  ? Endometriosis   ? Dr. Rushie Chestnut at [facility], removed   ? Esophageal reflux 05/25/2014  ? Gross hematuria 04/25/2015  ? H/O cystoscopy   ? normal  ? Heart murmur   ? Hx of basal cell carcinoma 12/29/2008  ? L low back  ? Hypertension   ? Infection of urinary tract 09/11/2014  ? Kidney stone 09/11/2014  ? Kidney stones   ? Microscopic hematuria 09/11/2014  ? Obesity, morbid, BMI 40.0-49.9 ([place]) 04/10/2012  ? PONV (postoperative nausea and vomiting)   ? Right ureteral stone 04/28/2015  ? Skin cancer   ? Urinary retention   ? ? ?Past Surgical History:  ?Procedure Laterality Date  ? BLADDER SURGERY  07/2003  ? CARPAL TUNNEL RELEASE Right   ? [place]  ? CHOLECYSTECTOMY    ? CYSTOSCOPY W/ RETROGRADES Right 05/16/2015  ? Procedure: CYSTOSCOPY WITH RETROGRADE PYELOGRAM;  Surgeon: Nickie Retort, MD;  Location: [facility];  Service: Urology;  Laterality: Right;  ? CYSTOSCOPY W/ URETERAL STENT PLACEMENT Right 05/16/2015  ? Procedure: CYSTOSCOPY WITH STENT REPLACEMENT;  Surgeon: Nickie Retort, MD;  Location: [facility];  Service: Urology;  Laterality: Right;  ? CYSTOSCOPY WITH STENT PLACEMENT Right 04/29/2015  ? Procedure:  CYSTOSCOPY WITH STENT PLACEMENT;  Surgeon: Nickie Retort, MD;  Location: [facility];  Service: Urology;  Laterality: Right;  ? EXTRACORPOREAL SHOCK WAVE LITHOTRIPSY Left 06/02/2015  ? Procedure: EXTRACORPOREAL SHOCK WAVE LITHOTRIPSY (ESWL);  Surgeon: Hollice Espy, MD;  Location: [facility];  Service: Urology;  Laterality: Left;  ? FOOT SURGERY  2003  ? KNEE SURGERY  2005  ? TONSILLECTOMY    ? TONSILLECTOMY    ? lingual growth on vocal cord removal  ? TOTAL ABDOMINAL HYSTERECTOMY W/ BILATERAL SALPINGOOPHORECTOMY    ? [facility] complete  ? URETEROSCOPY WITH HOLMIUM LASER LITHOTRIPSY Right 05/16/2015  ? Procedure: URETEROSCOPY WITH HOLMIUM LASER LITHOTRIPSY;  Surgeon: Nickie Retort, MD;  Location: [facility];  Service: Urology;  Laterality: Right;  ? ? ?Prior to Admission medications   ?Medication Sig Start Date End Date Taking? Authorizing Provider  ?ezetimibe (ZETIA) 10 MG tablet TAKE 1 TABLET (10 MG TOTAL) BY MOUTH DAILY. 05/19/21 05/19/22 Yes Johnson, Megan P, DO  ?fexofenadine (ALLEGRA) 180 MG tablet Take 180 mg by mouth daily.   Yes [provider]  ?pantoprazole (PROTONIX) 40 MG tablet Take 2 tablets (80 mg total) by mouth daily. 05/19/21 05/19/22 Yes Johnson, Megan P, DO  ?pravastatin (PRAVACHOL) 20 MG tablet TAKE 1 TABLET BY MOUTH DAILY 05/19/21  Yes Johnson, Megan P, DO  ?acyclovir (ZOVIRAX) 400 MG tablet Take 1 tablet (400 mg  total) by mouth 3 (three) times daily. 11/01/20   Johnson, Megan P, DO  ?albuterol (VENTOLIN HFA) 108 (90 Base) MCG/ACT inhaler INHALE 2 PUFFS BY MOUTH EVERY 6 HOURS AS NEEDED FOR WHEEZING OR SHORTNESS OF BREATH. 11/01/20 11/01/21  Johnson, Megan P, DO  ?azelastine (ASTELIN) 0.1 % nasal spray PLACE 1 SPRAY INTO BOTH NOSTRILS 2 TIMES DAILY. 12/30/19 12/29/20  Marnee Guarneri T, NP  ?busPIRone (BUSPAR) 10 MG tablet Take 1 tablet (10 mg total) by mouth 3 (three) times daily. 05/19/21   Valerie Roys, DO  ?Calcium Carbonate (CALCIUM 600 PO) Take 1 tablet by mouth daily.     [provider]  ?cyclobenzaprine (FLEXERIL) 10 MG tablet Take 1 tablet (10 mg total) by mouth at bedtime. 05/19/21   Park Liter P, DO  ?diclofenac Sodium (VOLTAREN) 1 % GEL APPLY 2 GRAMS TO THE AFFECTED AREA(S) 4 TIMES A DAY 05/16/21 05/16/22  Johnson, Megan P, DO  ?fluticasone (FLONASE) 50 MCG/ACT nasal spray PLACE 2 SPRAYS INTO BOTH NOSTRILS DAILY. 11/01/20 11/15/21  Park Liter P, DO  ?hydrochlorothiazide (HYDRODIURIL) 25 MG tablet TAKE 1 TABLET BY MOUTH DAILY. 05/19/21 05/19/22  Park Liter P, DO  ?meloxicam (MOBIC) 15 MG tablet TAKE 1 TABLET BY MOUTH DAILY. 05/19/21 05/19/22  Valerie Roys, DO  ?Nutritional Supplements (MENOPAUSE FORMULA) TABS Take by mouth. Reported on 11/04/2015    [provider]  ?pseudoephedrine (SUDAFED) 30 MG tablet Take 30 mg by mouth 3 (three) times daily.    [provider]  ?Simethicone 180 MG CAPS Take 1 capsule (180 mg total) by mouth 3 (three) times daily with meals. 03/05/16   Johnson, Megan P, DO  ?sucralfate (CARAFATE) 1 g tablet TAKE 1 TABLET BY MOUTH 4 TIMES DAILY - WITH MEALS AND AT BEDTIME. 05/19/21 05/19/22  Valerie Roys, DO  ?UNIFINE PENTIPS 31G X 8 MM MISC  10/23/19   [provider]  ?vitamin E 400 UNIT capsule Take 400 Units by mouth daily.    [provider]  ? ? ?Allergies as of 07/19/2021 - Review Complete 07/18/2021  ?Allergen Reaction Noted  ? Atorvastatin Other (See Comments) 09/17/2016  ? ? ?Family History  ?Problem Relation Age of Onset  ? Cancer Mother   ?     pancreatic cancer  ? Heart disease Mother   ?     Pacemaker and defib  ? Hypertension Mother   ? Hyperlipidemia Mother   ? Cancer Father   ? COPD Father   ?     Lung and brain cancer  ? Cancer Maternal Grandmother   ?     Colon  ? Diabetes Paternal Grandmother   ? Cirrhosis Paternal Grandmother   ? Cancer Paternal Grandmother   ? Kidney disease Neg Hx   ? Bladder Cancer Neg Hx   ? Breast cancer Neg Hx   ? ? ?Social History  ? ?Socioeconomic History  ? Marital status:  Married  ?  Spouse name: Not on file  ? Number of children: Not on file  ? Years of education: Not on file  ? Highest education level: Not on file  ?Occupational History  ? Not on file  ?Tobacco Use  ? Smoking status: Former  ?  Packs/day: 0.50  ?  Years: 17.00  ?  Pack years: 8.50  ?  Types: Cigarettes  ?  Quit date: 04/27/2008  ?  Years since quitting: 13.3  ? Smokeless tobacco: Never  ? Tobacco comments:  ?  quit 7  years   ?Vaping Use  ? Vaping Use: Never used  ?Substance and Sexual Activity  ? Alcohol use: No  ?  Alcohol/week: 0.0 standard drinks  ? Drug use: No  ? Sexual activity: Yes  ?Other Topics Concern  ? Not on file  ?Social History Narrative  ? Lives in [place] with son 15YO and fiance  ?   ? Work - [employer]  ? ?Social Determinants of Health  ? ?Financial Resource Strain: Not on file  ?Food Insecurity: Not on file  ?Transportation Needs: Not on file  ?Physical Activity: Not on file  ?Stress: Not on file  ?Social Connections: Not on file  ?Intimate Partner Violence: Not on file  ? ? ?Review of Systems: ?See HPI, otherwise negative ROS ? ?Physical Exam: ?BP (!) 159/79   Pulse 73   Temp (!) 96.4 ?F (35.8 ?C) (Temporal)   Resp 18   Ht '5\' 6"'$  (1.676 m)   Wt 127 kg   SpO2 100%   BMI 45.19 kg/m?  ?General:   Alert,  pleasant and cooperative in NAD ?Head:  Normocephalic and atraumatic. ?Neck:  Supple; no masses or thyromegaly. ?Lungs:  Clear throughout to auscultation.    ?Heart:  Regular rate and rhythm. ?Abdomen:  Soft, nontender and nondistended. Normal bowel sounds, without guarding, and without rebound.   ?Neurologic:  Alert and  oriented x4;  grossly normal neurologically. ? ?Impression/Plan: ?ARIEA ROCHIN is here for an endoscopy and colonoscopy to be performed for GERD and a change in bowel habits ? ?Risks, benefits, limitations, and alternatives regarding  endoscopy and colonoscopy have been reviewed with the patient.  Questions have been answered.  All parties agreeable. ? ? ?Lucilla Lame, MD   08/24/2021, 10:02 AM ?

## 2021-08-24 NOTE — Anesthesia Procedure Notes (Signed)
Date/Time: 08/24/2021 10:13 AM ?Performed by: Johnna Acosta, CRNA ?Pre-anesthesia Checklist: Patient identified, Emergency Drugs available, Suction available, Patient being monitored and Timeout performed ?Patient Re-evaluated:Patient Re-evaluated prior to induction ?Oxygen Delivery Method: Nasal cannula ?Preoxygenation: Pre-oxygenation with 100% oxygen ?Induction Type: IV induction ? ? ? ? ?

## 2021-08-24 NOTE — Transfer of Care (Signed)
Immediate Anesthesia Transfer of Care Note ? ?Patient: Latoya Lutz ? ?Procedure(s) Performed: COLONOSCOPY WITH PROPOFOL ?ESOPHAGOGASTRODUODENOSCOPY (EGD) ? ?Patient Location: PACU ? ?Anesthesia Type:General ? ?Level of Consciousness: awake and alert  ? ?Airway & Oxygen Therapy: Patient Spontanous Breathing ? ?Post-op Assessment: Report given to RN and Post -op Vital signs reviewed and stable ? ?Post vital signs: Reviewed and stable ? ?Last Vitals:  ?Vitals Value Taken Time  ?BP 127/70 08/24/21 1040  ?Temp 36.2 ?C 08/24/21 1040  ?Pulse 67 08/24/21 1042  ?Resp 14 08/24/21 1042  ?SpO2 100 % 08/24/21 1042  ? ? ?Last Pain:  ?Vitals:  ? 08/24/21 1040  ?TempSrc: Temporal  ?PainSc: 0-No pain  ?   ? ?  ? ?Complications: No notable events documented. ?

## 2021-08-24 NOTE — Anesthesia Preprocedure Evaluation (Signed)
Anesthesia Evaluation  ?Patient identified by MRN, date of birth, ID band ?Patient awake ? ? ? ?Reviewed: ?Allergy & Precautions, H&P , NPO status , Patient's Chart, lab work & pertinent test results, reviewed documented beta blocker date and time  ? ?History of Anesthesia Complications ?(+) PONV and history of anesthetic complications ? ?Airway ?Mallampati: II ? ? ?Neck ROM: full ? ? ? Dental ? ?(+) Poor Dentition ?  ?Pulmonary ?neg pulmonary ROS, former smoker,  ?  ?Pulmonary exam normal ? ? ? ? ? ? ? Cardiovascular ?Exercise Tolerance: Good ?hypertension, On Medications ?Normal cardiovascular exam+ Valvular Problems/Murmurs  ?Rhythm:regular Rate:Normal ? ? ?  ?Neuro/Psych ?Anxiety negative neurological ROS ? negative psych ROS  ? GI/Hepatic ?Neg liver ROS, GERD  Medicated,  ?Endo/Other  ?Morbid obesity ? Renal/GU ?Renal disease  ?negative genitourinary ?  ?Musculoskeletal ? ? Abdominal ?  ?Peds ? Hematology ?negative hematology ROS ?(+)   ?Anesthesia Other Findings ?Past Medical History: ?No date: Acid reflux ?No date: Actinic keratosis ?No date: Arthritis ?10/27/2019: Atypical mole ?    Comment:  Left labia mucosa  ?05/25/2020: Atypical nevus ?    Comment:  R labia majora - atypical proliferation ?05/25/2014: Candidal dermatitis ?No date: Cervical spine disease ?No date: Chicken pox ?No date: Chronic urethral narrowing ?    Comment:  undergoing stretching ?No date: Complication of anesthesia ?04/05/2021: Dysplastic nevus ?    Comment:  R post med thigh near popliteal, mod to severe atypia ?No date: Endometriosis ?    Comment:  Dr. Rushie Chestnut at [facility], removed  ?05/25/2014: Esophageal reflux ?04/25/2015: Gross hematuria ?No date: H/O cystoscopy ?    Comment:  normal ?No date: Heart murmur ?12/29/2008: Hx of basal cell carcinoma ?    Comment:  L low back ?No date: Hypertension ?09/11/2014: Infection of urinary tract ?09/11/2014: Kidney stone ?No date: Kidney stones ?09/11/2014:  Microscopic hematuria ?04/10/2012: Obesity, morbid, BMI 40.0-49.9 ([place]) ?No date: PONV (postoperative nausea and vomiting) ?04/28/2015: Right ureteral stone ?No date: Skin cancer ?No date: Urinary retention ?Past Surgical History: ?07/2003: BLADDER SURGERY ?No date: CARPAL TUNNEL RELEASE; Right ?1998: CESAREAN SECTION ?No date: CHOLECYSTECTOMY ?05/16/2015: CYSTOSCOPY W/ RETROGRADES; Right ?    Comment:  Procedure: CYSTOSCOPY WITH RETROGRADE PYELOGRAM;   ?             Surgeon: Nickie Retort, MD;  Location: [facility];   ?             Service: Urology;  Laterality: Right; ?05/16/2015: CYSTOSCOPY W/ URETERAL STENT PLACEMENT; Right ?    Comment:  Procedure: CYSTOSCOPY WITH STENT REPLACEMENT;  Surgeon:  ?             Nickie Retort, MD;  Location: [facility];  Service:  ?             Urology;  Laterality: Right; ?04/29/2015: CYSTOSCOPY WITH STENT PLACEMENT; Right ?    Comment:  Procedure: CYSTOSCOPY WITH STENT PLACEMENT;  Surgeon:  ?             Nickie Retort, MD;  Location: [facility];  Service:  ?             Urology;  Laterality: Right; ?06/02/2015: EXTRACORPOREAL SHOCK WAVE LITHOTRIPSY; Left ?    Comment:  Procedure: EXTRACORPOREAL SHOCK WAVE LITHOTRIPSY (ESWL); ?             Surgeon: Hollice Espy, MD;  Location: [facility];   ?  Service: Urology;  Laterality: Left; ?2003: FOOT SURGERY ?2005: KNEE SURGERY ?No date: TONSILLECTOMY ?No date: TONSILLECTOMY ?    Comment:  lingual growth on vocal cord removal ?No date: TOTAL ABDOMINAL HYSTERECTOMY W/ BILATERAL SALPINGOOPHORECTOMY ?    Comment:  [facility] complete ?05/16/2015: URETEROSCOPY WITH HOLMIUM LASER LITHOTRIPSY; Right ?    Comment:  Procedure: URETEROSCOPY WITH HOLMIUM LASER LITHOTRIPSY;  ?             Surgeon: Nickie Retort, MD;  Location: [facility];   ?             Service: Urology;  Laterality: Right; ?BMI   ? Body Mass Index: 45.19 kg/m?  ?  ? Reproductive/Obstetrics ?negative OB ROS ? ?  ? ? ? ? ? ? ? ? ? ? ? ? ? ?  ?   ? ? ? ? ? ? ? ? ?Anesthesia Physical ?Anesthesia Plan ? ?ASA: 3 ? ?Anesthesia Plan: General  ? ?Post-op Pain Management:   ? ?Induction:  ? ?PONV Risk Score and Plan:  ? ?Airway Management Planned:  ? ?Additional Equipment:  ? ?Intra-op Plan:  ? ?Post-operative Plan:  ? ?Informed Consent: I have reviewed the patients History and Physical, chart, labs and discussed the procedure including the risks, benefits and alternatives for the proposed anesthesia with the patient or authorized representative who has indicated his/her understanding and acceptance.  ? ? ? ?Dental Advisory Given ? ?Plan Discussed with: CRNA ? ?Anesthesia Plan Comments:   ? ? ? ? ? ? ?Anesthesia Quick Evaluation ? ?

## 2021-08-24 NOTE — Op Note (Signed)
[facility] ?Gastroenterology ?Patient Name: Latoya Lutz ?Procedure Date: 08/24/2021 10:12 AM ?MRN: [id number] ?Account #: [id number] ?Date of Birth: [birth year] ?Admit Type: Outpatient ?Age: 51 ?Room: [facility] ENDO ROOM 3 ?Gender: Female ?Note Status: Finalized ?Instrument Name: Colonoscope [id number] ?Procedure:             Colonoscopy ?Indications:           Change in bowel habits ?Providers:             Lucilla Lame MD, MD ?Medicines:             Propofol per Anesthesia ?Complications:         No immediate complications. ?Procedure:             Pre-Anesthesia Assessment: ?                       - Prior to the procedure, a History and Physical was  ?                       performed, and patient medications and allergies were  ?                       reviewed. The patient's tolerance of previous  ?                       anesthesia was also reviewed. The risks and benefits  ?                       of the procedure and the sedation options and risks  ?                       were discussed with the patient. All questions were  ?                       answered, and informed consent was obtained. Prior  ?                       Anticoagulants: The patient has taken no previous  ?                       anticoagulant or antiplatelet agents. ASA Grade  ?                       Assessment: II - A patient with mild systemic disease.  ?                       After reviewing the risks and benefits, the patient  ?                       was deemed in satisfactory condition to undergo the  ?                       procedure. ?                       After obtaining informed consent, the colonoscope was  ?                       passed under direct vision. Throughout the procedure,  ?  the patient's blood pressure, pulse, and oxygen  ?                       saturations were monitored continuously. The  ?                       Colonoscope was introduced through the anus and  ?                        advanced to the the cecum, identified by the  ?                       appendiceal orifice, ileocecal valve and palpation.  ?                       The colonoscopy was performed without difficulty. The  ?                       patient tolerated the procedure well. The quality of  ?                       the bowel preparation was excellent. ?Findings: ?     The perianal and digital rectal examinations were normal. ?     Two sessile polyps were found in the rectum. The polyps were 2 to 3 mm  ?     in size. These polyps were removed with a cold biopsy forceps. Resection  ?     and retrieval were complete. ?     A 6 mm polyp was found in the ascending colon. The polyp was sessile.  ?     The polyp was removed with a cold biopsy forceps. Resection and  ?     retrieval were complete. ?     A few small-mouthed diverticula were found in the entire colon. ?     Non-bleeding internal hemorrhoids were found during retroflexion. The  ?     hemorrhoids were Grade I (internal hemorrhoids that do not prolapse). ?Impression:            - Two 2 to 3 mm polyps in the rectum, removed with a  ?                       cold biopsy forceps. Resected and retrieved. ?                       - One 6 mm polyp in the ascending colon, removed with  ?                       a cold biopsy forceps. Resected and retrieved. ?                       - Diverticulosis in the entire examined colon. ?                       - Non-bleeding internal hemorrhoids. ?Recommendation:        - Discharge patient to home. ?                       - Resume previous diet. ?                       -  Continue present medications. ?                       - Await pathology results. ?                       - If the pathology report reveals adenomatous tissue,  ?                       then repeat the colonoscopy for surveillance in 7  ?                       years. ?Procedure Code(s):     --- Professional --- ?                       [phone], Colonoscopy, flexible; with biopsy,  single or  ?                       multiple ?Diagnosis Code(s):     --- Professional --- ?                       R19.4, Change in bowel habit ?                       K63.5, Polyp of colon ?CPT copyright 2019 [employer]. All rights reserved. ?The codes documented in this report are preliminary and upon coder review may  ?be revised to meet current compliance requirements. ?Lucilla Lame MD, MD ?08/24/2021 10:42:54 AM ?This report has been signed electronically. ?Number of Addenda: 0 ?Note Initiated On: 08/24/2021 10:12 AM ?Scope Withdrawal Time: 0 hours 6 minutes 3 seconds  ?Total Procedure Duration: 0 hours 11 minutes 28 seconds  ?Estimated Blood Loss:  Estimated blood loss: none. ?     [facility] ?

## 2021-08-24 NOTE — Op Note (Signed)
[facility] ?Gastroenterology ?Patient Name: Latoya Lutz ?Procedure Date: 08/24/2021 10:16 AM ?MRN: [id number] ?Account #: [id number] ?Date of Birth: Aug 17, 1970 ?Admit Type: Outpatient ?Age: 51 ?Room: [facility] ENDO ROOM 3 ?Gender: Female ?Note Status: Finalized ?Instrument Name: Upper Endoscope [id number] ?Procedure:             Upper GI endoscopy ?Indications:           Heartburn ?Providers:             Lucilla Lame MD, MD ?Medicines:             Propofol per Anesthesia ?Complications:         No immediate complications. ?Procedure:             Pre-Anesthesia Assessment: ?                       - Prior to the procedure, a History and Physical was  ?                       performed, and patient medications and allergies were  ?                       reviewed. The patient's tolerance of previous  ?                       anesthesia was also reviewed. The risks and benefits  ?                       of the procedure and the sedation options and risks  ?                       were discussed with the patient. All questions were  ?                       answered, and informed consent was obtained. Prior  ?                       Anticoagulants: The patient has taken no previous  ?                       anticoagulant or antiplatelet agents. ASA Grade  ?                       Assessment: II - A patient with mild systemic disease.  ?                       After reviewing the risks and benefits, the patient  ?                       was deemed in satisfactory condition to undergo the  ?                       procedure. ?                       After obtaining informed consent, the endoscope was  ?                       passed under direct vision. Throughout the procedure,  ?  the patient's blood pressure, pulse, and oxygen  ?                       saturations were monitored continuously. The Endoscope  ?                       was introduced through the mouth, and advanced to the  ?                        second part of duodenum. The upper GI endoscopy was  ?                       accomplished without difficulty. The patient tolerated  ?                       the procedure well. ?Findings: ?     The examined esophagus was normal. ?     Multiple sessile polyps with no bleeding and no stigmata of recent  ?     bleeding were found in the gastric fundus. ?     Localized mild inflammation characterized by erythema was found in the  ?     gastric antrum. Biopsies were taken with a cold forceps for histology. ?     The examined duodenum was normal. ?Impression:            - Normal esophagus. ?                       - Multiple gastric polyps. ?                       - Gastritis. Biopsied. ?                       - Normal examined duodenum. ?Recommendation:        - Discharge patient to home. ?                       - Resume previous diet. ?                       - Continue present medications. ?                       - Await pathology results. ?                       - Perform a colonoscopy today. ?Procedure Code(s):     --- Professional --- ?                       [phone], Esophagogastroduodenoscopy, flexible,  ?                       transoral; with biopsy, single or multiple ?Diagnosis Code(s):     --- Professional --- ?                       R12, Heartburn ?                       K29.70, Gastritis, unspecified, without bleeding ?CPT copyright 2019 American Medical Association. All rights reserved. ?The codes documented in  this report are preliminary and upon coder review may  ?be revised to meet current compliance requirements. ?Lucilla Lame MD, MD ?08/24/2021 10:23:45 AM ?This report has been signed electronically. ?Number of Addenda: 0 ?Note Initiated On: 08/24/2021 10:16 AM ?Estimated Blood Loss:  Estimated blood loss: none. ?     [facility] ?

## 2021-08-25 ENCOUNTER — Encounter: Payer: Self-pay | Admitting: Gastroenterology

## 2021-08-25 LAB — SURGICAL PATHOLOGY

## 2021-08-28 ENCOUNTER — Other Ambulatory Visit: Payer: Self-pay

## 2021-08-30 NOTE — Anesthesia Postprocedure Evaluation (Signed)
Anesthesia Post Note ? ?Patient: Latoya Lutz ? ?Procedure(s) Performed: COLONOSCOPY WITH PROPOFOL ?ESOPHAGOGASTRODUODENOSCOPY (EGD) ? ?Patient location during evaluation: PACU ?Anesthesia Type: General ?Level of consciousness: awake and alert ?Pain management: pain level controlled ?Vital Signs Assessment: post-procedure vital signs reviewed and stable ?Respiratory status: spontaneous breathing, nonlabored ventilation, respiratory function stable and patient connected to nasal cannula oxygen ?Cardiovascular status: blood pressure returned to baseline and stable ?Postop Assessment: no apparent nausea or vomiting ?Anesthetic complications: no ? ? ?No notable events documented. ? ? ?Last Vitals:  ?Vitals:  ? 08/24/21 1050 08/24/21 1100  ?BP: (!) 141/69 (!) 143/72  ?Pulse:    ?Resp:    ?Temp:    ?SpO2:    ?  ?Last Pain:  ?Vitals:  ? 08/25/21 0730  ?TempSrc:   ?PainSc: 0-No pain  ? ? ?  ?  ?  ?  ?  ?  ? ?Molli Barrows ? ? ? ? ?

## 2021-09-01 ENCOUNTER — Encounter: Payer: Self-pay | Admitting: Gastroenterology

## 2021-09-18 ENCOUNTER — Other Ambulatory Visit: Payer: Self-pay

## 2021-10-09 ENCOUNTER — Ambulatory Visit (INDEPENDENT_AMBULATORY_CARE_PROVIDER_SITE_OTHER): Payer: 59 | Admitting: Dermatology

## 2021-10-09 DIAGNOSIS — D225 Melanocytic nevi of trunk: Secondary | ICD-10-CM | POA: Diagnosis not present

## 2021-10-09 DIAGNOSIS — L821 Other seborrheic keratosis: Secondary | ICD-10-CM

## 2021-10-09 DIAGNOSIS — D2371 Other benign neoplasm of skin of right lower limb, including hip: Secondary | ICD-10-CM

## 2021-10-09 DIAGNOSIS — D280 Benign neoplasm of vulva: Secondary | ICD-10-CM

## 2021-10-09 DIAGNOSIS — D229 Melanocytic nevi, unspecified: Secondary | ICD-10-CM

## 2021-10-09 DIAGNOSIS — D239 Other benign neoplasm of skin, unspecified: Secondary | ICD-10-CM

## 2021-10-09 DIAGNOSIS — L578 Other skin changes due to chronic exposure to nonionizing radiation: Secondary | ICD-10-CM | POA: Diagnosis not present

## 2021-10-09 NOTE — Progress Notes (Signed)
? ?  Follow-Up Visit ?  ?Subjective  ?Latoya Lutz is a 51 y.o. female who presents for the following: Follow-up (Patient here today for recheck several nevi  at back and recheck a bx proven atypical nevus at right post med thigh. ). ?The patient has spots, moles and lesions to be evaluated, some may be new or changing and the patient has concerns that these could be cancer. ? ?The following portions of the chart were reviewed this encounter and updated as appropriate:  Tobacco  Allergies  Meds  Problems  Med Hx  Surg Hx  Fam Hx   ?  ?Review of Systems: No other skin or systemic complaints except as noted in HPI or Assessment and Plan. ? ?Objective  ?Well appearing patient in no apparent distress; mood and affect are within normal limits. ? ?A focused examination was performed including back, right lower leg. Relevant physical exam findings are noted in the Assessment and Plan. ? ?right post med thigh near popliteal ?Clear at exam  ? ?back ?Scattered brown macules on the back.  ?  ?R upper back sup 1.0 x 0.6cm  ?R paraspinal bra line 1.1 cm  ?L upper back paraspinal 1.0 x 0.6 cm  ? ? ? ? ? ? ? ?Assessment & Plan  ?Dysplastic nevus ?right post med thigh near popliteal ?Clear. Observe for recurrence. Call clinic for new or changing lesions.  Recommend regular skin exams, daily broad-spectrum spf 30+ sunscreen use, and photoprotection.   ?Hx of bx proven dysplastic nevus with moderate to severe atypia with scar and persistent nevus-like changes, close to margin.  ? ?Nevus ?back ?See photos ?Benign-appearing.  Observation.  Call clinic for new or changing lesions.  Recommend daily use of broad spectrum spf 30+ sunscreen to sun-exposed areas.  ? 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 Anne Fu [facility] clinic and had additional biopsies done. She states they were not concerned about the results and patient hasn't noticed  recurrence. Continue annual vaginal exams with GYN or PCP. ?Reviewed notes/records from [facility] visits. ? ?Seborrheic Keratoses ?- Stuck-on, waxy, tan-brown papules and/or plaques  ?- Benign-appearing ?- Discussed benign etiology and prognosis. ?- Observe ?- Call for any changes ? ?Actinic Damage ?- chronic, secondary to cumulative UV radiation exposure/sun exposure over time ?- diffuse scaly erythematous macules with underlying dyspigmentation ?- Recommend daily broad spectrum sunscreen SPF 30+ to sun-exposed areas, reapply every 2 hours as needed.  ?- Recommend staying in the shade or wearing long sleeves, sun glasses (UVA+UVB protection) and wide brim hats (4-inch brim around the entire circumference of the hat). ?- Call for new or changing lesions. ? ?Return for keep follow up as scheduled. ?I, Ruthell Rummage, CMA, am acting as scribe for Sarina Ser, MD. ?Documentation: I have reviewed the above documentation for accuracy and completeness, and I agree with the above. ? ?Sarina Ser, MD ? ?

## 2021-10-09 NOTE — Patient Instructions (Signed)

## 2021-10-13 ENCOUNTER — Other Ambulatory Visit: Payer: Self-pay

## 2021-10-13 MED FILL — Diclofenac Sodium Gel 1% (1.16% Diethylamine Equiv): CUTANEOUS | 37 days supply | Qty: 300 | Fill #1 | Status: AC

## 2021-10-16 ENCOUNTER — Other Ambulatory Visit: Payer: Self-pay

## 2021-10-17 ENCOUNTER — Encounter: Payer: Self-pay | Admitting: Dermatology

## 2021-11-03 ENCOUNTER — Encounter: Payer: 59 | Admitting: Family Medicine

## 2021-11-07 ENCOUNTER — Other Ambulatory Visit: Payer: Self-pay

## 2021-11-08 ENCOUNTER — Encounter: Payer: Self-pay | Admitting: Podiatry

## 2021-11-08 ENCOUNTER — Ambulatory Visit (INDEPENDENT_AMBULATORY_CARE_PROVIDER_SITE_OTHER): Payer: 59 | Admitting: Podiatry

## 2021-11-08 DIAGNOSIS — L600 Ingrowing nail: Secondary | ICD-10-CM

## 2021-11-08 NOTE — Patient Instructions (Signed)
Soak Instructions  Do these for 10 days and then leave open to air   [place] 1/4 cup of epsom salts (or betadine, or white vinegar) in a quart of warm tap water.  Submerge your foot or feet with outer bandage intact for the initial soak; this will allow the bandage to become moist and wet for easy lift off.  Once you remove your bandage, continue to soak in the solution for 20 minutes.  This soak should be done twice a day.  Next, remove your foot or feet from solution, blot dry the affected area and cover.  You may use a band aid large enough to cover the area or use gauze and tape.  Apply other medications to the area as directed by the doctor such as polysporin neosporin.  IF YOUR SKIN BECOMES IRRITATED WHILE USING THESE INSTRUCTIONS, IT IS OKAY TO SWITCH TO  WHITE VINEGAR AND WATER. Or you may use antibacterial soap and water to keep the toe clean  Monitor for any signs/symptoms of infection. Call the office immediately if any occur or go directly to the emergency room. Call with any questions/concerns.

## 2021-11-08 NOTE — Progress Notes (Signed)
  Subjective:  Patient ID: Latoya Lutz, female    DOB: 1970-09-10,  MRN: [id number]  Chief Complaint  Patient presents with   Nail Problem    "I've got an ugly toe."    51 y.o. female presents with the above complaint. History confirmed with patient.  Ingrown nails are present for couple weeks she is trying to doctor at home  Objective:  Physical Exam: warm, good capillary refill, no trophic changes or ulcerative lesions, normal DP and PT pulses, normal sensory exam, and ingrown nail medial border.  Assessment:   1. Ingrown nail of great toe of right foot      Plan:  Patient was evaluated and treated and all questions answered.    Ingrown Nail, right -Patient elects to proceed with minor surgery to remove ingrown toenail today. Consent reviewed and signed by patient. -Ingrown nail excised. See procedure note. -Educated on post-procedure care including soaking. Written instructions provided and reviewed.  Procedure: Excision of Ingrown Toenail Location: Right 1st toe medial nail borders. Anesthesia: Lidocaine 1% plain; 1.5 mL and Marcaine 0.5% plain; 1.5 mL, digital block. Skin Prep: Betadine. Dressing: Silvadene; telfa; dry, sterile, compression dressing. Technique: Following skin prep, the toe was exsanguinated and a tourniquet was secured at the base of the toe. The affected nail border was freed, split with a nail splitter, and excised. The tourniquet was then removed and sterile dressing applied. Disposition: Patient tolerated procedure well. Patient to return in 2 weeks for follow-up.    Return if symptoms worsen or fail to improve.

## 2021-11-20 ENCOUNTER — Encounter: Payer: 59 | Admitting: Family Medicine

## 2021-12-04 ENCOUNTER — Encounter: Payer: Self-pay | Admitting: Family Medicine

## 2021-12-04 ENCOUNTER — Ambulatory Visit (INDEPENDENT_AMBULATORY_CARE_PROVIDER_SITE_OTHER): Payer: 59 | Admitting: Family Medicine

## 2021-12-04 ENCOUNTER — Other Ambulatory Visit: Payer: Self-pay

## 2021-12-04 VITALS — BP 135/80 | HR 65 | Temp 98.1°F | Ht 64.5 in | Wt 297.0 lb

## 2021-12-04 DIAGNOSIS — I10 Essential (primary) hypertension: Secondary | ICD-10-CM

## 2021-12-04 DIAGNOSIS — Z0000 Encounter for general adult medical examination without abnormal findings: Secondary | ICD-10-CM | POA: Diagnosis not present

## 2021-12-04 DIAGNOSIS — K219 Gastro-esophageal reflux disease without esophagitis: Secondary | ICD-10-CM | POA: Diagnosis not present

## 2021-12-04 DIAGNOSIS — Z1231 Encounter for screening mammogram for malignant neoplasm of breast: Secondary | ICD-10-CM | POA: Diagnosis not present

## 2021-12-04 DIAGNOSIS — E782 Mixed hyperlipidemia: Secondary | ICD-10-CM

## 2021-12-04 LAB — MICROALBUMIN, URINE WAIVED
Creatinine, Urine Waived: 50 mg/dL (ref 10–300)
Microalb, Ur Waived: 10 mg/L (ref 0–19)
Microalb/Creat Ratio: <30 mg/g (ref <30)

## 2021-12-04 LAB — URINALYSIS, ROUTINE W REFLEX MICROSCOPIC
Bilirubin, UA: NEGATIVE
Glucose, UA: NEGATIVE
Ketones, UA: NEGATIVE
Leukocytes,UA: NEGATIVE
Nitrite, UA: NEGATIVE
Protein,UA: NEGATIVE
RBC, UA: NEGATIVE
Specific Gravity, UA: 1.01 (ref 1.005–1.030)
Urobilinogen, Ur: 0.2 mg/dL (ref 0.2–1.0)
pH, UA: 7 (ref 5.0–7.5)

## 2021-12-04 MED ORDER — PRAVASTATIN SODIUM 20 MG PO TABS
ORAL_TABLET | ORAL | 1 refills | Status: DC
  Filled 2021-12-04: qty 90, 90d supply, fill #0
  Filled 2022-05-27: qty 90, 90d supply, fill #1

## 2021-12-04 MED ORDER — HYDROCHLOROTHIAZIDE 25 MG PO TABS
ORAL_TABLET | Freq: Every day | ORAL | 1 refills | Status: DC
  Filled 2021-12-04: qty 90, 90d supply, fill #0
  Filled 2022-03-11: qty 90, 90d supply, fill #1

## 2021-12-04 MED ORDER — CYCLOBENZAPRINE HCL 10 MG PO TABS
10.0000 mg | ORAL_TABLET | Freq: Every day | ORAL | 2 refills | Status: DC
  Filled 2021-12-04: qty 30, 30d supply, fill #0

## 2021-12-04 MED ORDER — PANTOPRAZOLE SODIUM 40 MG PO TBEC
80.0000 mg | DELAYED_RELEASE_TABLET | Freq: Every day | ORAL | 1 refills | Status: DC
  Filled 2021-12-04 – 2022-02-04 (×2): qty 180, 90d supply, fill #0
  Filled 2022-05-06: qty 180, 90d supply, fill #1

## 2021-12-04 MED ORDER — FLUTICASONE PROPIONATE 50 MCG/ACT NA SUSP
2.0000 | Freq: Every day | NASAL | 3 refills | Status: DC
  Filled 2021-12-04: qty 48, 90d supply, fill #0

## 2021-12-05 LAB — COMPREHENSIVE METABOLIC PANEL
ALT: 17 IU/L (ref 0–32)
AST: 16 IU/L (ref 0–40)
Albumin/Globulin Ratio: 1.8 (ref 1.2–2.2)
Albumin: 4.2 g/dL (ref 3.8–4.9)
Alkaline Phosphatase: 92 IU/L (ref 44–121)
BUN/Creatinine Ratio: 23 (ref 9–23)
BUN: 16 mg/dL (ref 6–24)
Bilirubin Total: 0.2 mg/dL (ref 0.0–1.2)
CO2: 27 mmol/L (ref 20–29)
Calcium: 9.3 mg/dL (ref 8.7–10.2)
Chloride: 100 mmol/L (ref 96–106)
Creatinine, Ser: 0.71 mg/dL (ref 0.57–1.00)
Globulin, Total: 2.3 g/dL (ref 1.5–4.5)
Glucose: 85 mg/dL (ref 70–99)
Potassium: 4 mmol/L (ref 3.5–5.2)
Sodium: 140 mmol/L (ref 134–144)
Total Protein: 6.5 g/dL (ref 6.0–8.5)
eGFR: 102 mL/min/{1.73_m2} (ref >59)

## 2021-12-05 LAB — LIPID PANEL W/O CHOL/HDL RATIO
Cholesterol, Total: 178 mg/dL (ref 100–199)
HDL: 57 mg/dL (ref >39)
LDL Chol Calc (NIH): 102 mg/dL — ABNORMAL HIGH (ref 0–99)
Triglycerides: 103 mg/dL (ref 0–149)
VLDL Cholesterol Cal: 19 mg/dL (ref 5–40)

## 2021-12-05 LAB — CBC WITH DIFFERENTIAL/PLATELET
Basophils Absolute: 0 10*3/uL (ref 0.0–0.2)
Basos: 1 %
EOS (ABSOLUTE): 0.2 10*3/uL (ref 0.0–0.4)
Eos: 2 %
Hematocrit: 37.8 % (ref 34.0–46.6)
Hemoglobin: 12.4 g/dL (ref 11.1–15.9)
Immature Grans (Abs): 0 10*3/uL (ref 0.0–0.1)
Immature Granulocytes: 0 %
Lymphocytes Absolute: 3.1 10*3/uL (ref 0.7–3.1)
Lymphs: 39 %
MCH: 27.1 pg (ref 26.6–33.0)
MCHC: 32.8 g/dL (ref 31.5–35.7)
MCV: 83 fL (ref 79–97)
Monocytes Absolute: 0.6 10*3/uL (ref 0.1–0.9)
Monocytes: 7 %
Neutrophils Absolute: 4.2 10*3/uL (ref 1.4–7.0)
Neutrophils: 51 %
Platelets: 238 10*3/uL (ref 150–450)
RBC: 4.57 x10E6/uL (ref 3.77–5.28)
RDW: 13.9 % (ref 11.7–15.4)
WBC: 8.1 10*3/uL (ref 3.4–10.8)

## 2021-12-05 LAB — TSH: TSH: 3.19 u[IU]/mL (ref 0.450–4.500)

## 2021-12-13 ENCOUNTER — Other Ambulatory Visit: Payer: Self-pay

## 2021-12-13 ENCOUNTER — Other Ambulatory Visit: Payer: Self-pay | Admitting: Family Medicine

## 2021-12-14 ENCOUNTER — Other Ambulatory Visit: Payer: Self-pay

## 2021-12-14 MED ORDER — ACYCLOVIR 400 MG PO TABS
400.0000 mg | ORAL_TABLET | Freq: Three times a day (TID) | ORAL | 0 refills | Status: DC
  Filled 2021-12-14: qty 270, 90d supply, fill #0

## 2021-12-14 NOTE — Telephone Encounter (Signed)
Requested Prescriptions  Pending Prescriptions Disp Refills  . acyclovir (ZOVIRAX) 400 MG tablet 30 tablet 12    Sig: Take 1 tablet (400 mg total) by mouth 3 (three) times daily.     Antimicrobials:  Antiviral Agents - Anti-Herpetic Passed - 12/13/2021  2:59 PM      Passed - Valid encounter within last 12 months    Recent Outpatient Visits          1 week ago Routine general medical examination at a health care facility   [facility] [place], [place]   6 months ago Essential hypertension   [place], Megan P, DO   10 months ago Generalized abdominal pain   [place], DO   1 year ago Routine general medical examination at a health care facility   [facility] [place], DO   1 year ago Lower abdominal pain   [place], Lauren A, NP      Future Appointments            In 1 week McGowan, Gordan Payment [place]   In 3 months Ralene Bathe, MD [place]   In 6 months Wynetta Emery, Barb Merino, DO [facility], [place]

## 2021-12-15 ENCOUNTER — Ambulatory Visit: Payer: 59 | Admitting: Urology

## 2021-12-24 MED FILL — Diclofenac Sodium Gel 1% (1.16% Diethylamine Equiv): CUTANEOUS | 37 days supply | Qty: 300 | Fill #2 | Status: CN

## 2021-12-26 NOTE — Progress Notes (Unsigned)
12/27/21 3:34 PM   [place] 1971-03-17 [id number]  Referring provider:  Valerie Roys, DO [place],  [place] [address]  Chief Complaint  Patient presents with   Urethral Stricture    Urological history  1. Urethral stricture - diagnosed by Dr. Madelin Headings with urethral dilation and had been receiving serial dilations every 4 to 6 months since 2013 - managed with urethral dilation q 5 months at this time   2. Nephrolithiasis - right ureteral stones treated with ESWL and right URS/LL/right ureteral stent placement and stent removal in winter 2016 - CT Renal stone study performed on 04/05/2017 noted no nephroureterolithiasis.  No hydronephrosis   HPI: Latoya Lutz is a 51 y.o.female  who presents today for urethral dilation.    She has noticed over the last few weeks has been more difficult to urinate and she has been getting up at night to urinate as well which is unusual.  Patient denies any modifying or aggravating factors.  Patient denies any gross hematuria, dysuria or suprapubic/flank pain.  Patient denies any fevers, chills, nausea or vomiting.    PMH: Past Medical History:  Diagnosis Date   Acid reflux    Actinic keratosis    Arthritis    Atypical mole 10/27/2019   Left labia mucosa    Atypical nevus 05/25/2020   R labia majora - atypical proliferation   Candidal dermatitis 05/25/2014   Cervical spine disease    Chicken pox    Chronic urethral narrowing    undergoing stretching   Complication of anesthesia    Dysplastic nevus 04/05/2021   R post med thigh near popliteal, mod to severe atypia   Endometriosis    Dr. Rushie Chestnut at [facility], removed    Esophageal reflux 05/25/2014   Gross hematuria 04/25/2015   H/O cystoscopy    normal   Heart murmur    Hx of basal cell carcinoma 12/29/2008   L low back   Hypertension    Infection of urinary tract 09/11/2014   Kidney stone 09/11/2014   Kidney stones    Microscopic hematuria 09/11/2014    Obesity, morbid, BMI 40.0-49.9 ([place]) 04/10/2012   PONV (postoperative nausea and vomiting)    Right ureteral stone 04/28/2015   Skin cancer    Urinary retention     Surgical History: Past Surgical History:  Procedure Laterality Date   BLADDER SURGERY  07/2003   CARPAL TUNNEL RELEASE Right    CESAREAN SECTION  1998   CHOLECYSTECTOMY     COLONOSCOPY WITH PROPOFOL N/A 08/24/2021   Procedure: COLONOSCOPY WITH PROPOFOL;  Surgeon: Lucilla Lame, MD;  Location: [facility] ENDOSCOPY;  Service: Endoscopy;  Laterality: N/A;  9 AM ARRIVAL, PLEASE   CYSTOSCOPY W/ RETROGRADES Right 05/16/2015   Procedure: CYSTOSCOPY WITH RETROGRADE PYELOGRAM;  Surgeon: Nickie Retort, MD;  Location: [facility] ORS;  Service: Urology;  Laterality: Right;   CYSTOSCOPY W/ URETERAL STENT PLACEMENT Right 05/16/2015   Procedure: CYSTOSCOPY WITH STENT REPLACEMENT;  Surgeon: Nickie Retort, MD;  Location: [facility] ORS;  Service: Urology;  Laterality: Right;   CYSTOSCOPY WITH STENT PLACEMENT Right 04/29/2015   Procedure: CYSTOSCOPY WITH STENT PLACEMENT;  Surgeon: Nickie Retort, MD;  Location: [facility] ORS;  Service: Urology;  Laterality: Right;   ESOPHAGOGASTRODUODENOSCOPY N/A 08/24/2021   Procedure: ESOPHAGOGASTRODUODENOSCOPY (EGD);  Surgeon: Lucilla Lame, MD;  Location: [facility] ENDOSCOPY;  Service: Endoscopy;  Laterality: N/A;   EXTRACORPOREAL SHOCK WAVE LITHOTRIPSY Left 06/02/2015   Procedure: EXTRACORPOREAL SHOCK WAVE LITHOTRIPSY (ESWL);  Surgeon:  Hollice Espy, MD;  Location: [facility] ORS;  Service: Urology;  Laterality: Left;   FOOT SURGERY  2003   KNEE SURGERY  2005   TONSILLECTOMY     TONSILLECTOMY     lingual growth on vocal cord removal   TOTAL ABDOMINAL HYSTERECTOMY W/ BILATERAL SALPINGOOPHORECTOMY     [facility] complete   URETEROSCOPY WITH HOLMIUM LASER LITHOTRIPSY Right 05/16/2015   Procedure: URETEROSCOPY WITH HOLMIUM LASER LITHOTRIPSY;  Surgeon: Nickie Retort, MD;  Location: [facility] ORS;  Service: Urology;  Laterality: Right;     Home Medications:  Allergies as of 12/27/2021       Reactions   Atorvastatin Other (See Comments)   Leg cramps        Medication List        Accurate as of December 27, 2021  3:34 PM. If you have any questions, ask your nurse or doctor.          acyclovir 400 MG tablet Commonly known as: ZOVIRAX Take 1 tablet (400 mg total) by mouth 3 (three) times daily.   albuterol 108 (90 Base) MCG/ACT inhaler Commonly known as: VENTOLIN HFA INHALE 2 PUFFS BY MOUTH EVERY 6 HOURS AS NEEDED FOR WHEEZING OR SHORTNESS OF BREATH.   azelastine 0.1 % nasal spray Commonly known as: ASTELIN PLACE 1 SPRAY INTO BOTH NOSTRILS 2 TIMES DAILY.   busPIRone 10 MG tablet Commonly known as: BUSPAR Take 1 tablet (10 mg total) by mouth 3 (three) times daily.   CALCIUM 600 PO Take 1 tablet by mouth daily.   cyclobenzaprine 10 MG tablet Commonly known as: FLEXERIL Take 1 tablet (10 mg total) by mouth at bedtime.   diclofenac Sodium 1 % Gel Commonly known as: VOLTAREN APPLY 2 GRAMS TO THE AFFECTED AREA(S) 4 TIMES A DAY   ezetimibe 10 MG tablet Commonly known as: ZETIA TAKE 1 TABLET (10 MG TOTAL) BY MOUTH DAILY.   fexofenadine 180 MG tablet Commonly known as: ALLEGRA Take 180 mg by mouth daily.   fluticasone 50 MCG/ACT nasal spray Commonly known as: FLONASE PLACE 2 SPRAYS INTO BOTH NOSTRILS DAILY.   hydrochlorothiazide 25 MG tablet Commonly known as: HYDRODIURIL TAKE 1 TABLET BY MOUTH DAILY.   magnesium 30 MG tablet Take 30 mg by mouth 2 (two) times daily.   meloxicam 15 MG tablet Commonly known as: MOBIC TAKE 1 TABLET BY MOUTH DAILY.   Menopause Formula Tabs Take by mouth. Reported on 11/04/2015   pantoprazole 40 MG tablet Commonly known as: PROTONIX Take 2 tablets (80 mg total) by mouth daily.   pravastatin 20 MG tablet Commonly known as: PRAVACHOL TAKE 1 TABLET BY MOUTH DAILY   pseudoephedrine 30 MG tablet Commonly known as: SUDAFED Take 30 mg by mouth 3 (three) times  daily.   Simethicone 180 MG Caps Take 1 capsule (180 mg total) by mouth 3 (three) times daily with meals.   sucralfate 1 g tablet Commonly known as: CARAFATE TAKE 1 TABLET BY MOUTH 4 TIMES DAILY - WITH MEALS AND AT BEDTIME.   Unifine Pentips 31G X 8 MM Misc Generic drug: Insulin Pen Needle   vitamin E 180 MG (400 UNITS) capsule Take 400 Units by mouth daily.        Allergies:  Allergies  Allergen Reactions   Atorvastatin Other (See Comments)    Leg cramps    Family History: Family History  Problem Relation Age of Onset   Cancer Mother        pancreatic cancer   Heart disease Mother  Pacemaker and defib   Hypertension Mother    Hyperlipidemia Mother    Cancer Father    COPD Father        Lung and brain cancer   Cancer Maternal Grandmother        Colon   Diabetes Paternal Grandmother    Cirrhosis Paternal Grandmother    Cancer Paternal Grandmother    Kidney disease Neg Hx    Bladder Cancer Neg Hx    Breast cancer Neg Hx     Social History:  reports that she quit smoking about 13 years ago. Her smoking use included cigarettes. She has a 8.50 pack-year smoking history. She has never used smokeless tobacco. She reports that she does not drink alcohol and does not use drugs.   Physical Exam: Blood pressure (!) 145/81, pulse 66, height '5\' 6"'$  (1.676 m), weight 281 lb (127.5 kg).  Constitutional:  Well nourished. Alert and oriented, No acute distress. HEENT: [place] AT, moist mucus membranes.  Trachea midline Cardiovascular: No clubbing, cyanosis, or edema. Respiratory: Normal respiratory effort, no increased work of breathing. Neurologic: Grossly intact, no focal deficits, moving all 4 extremities. Psychiatric: Normal mood and affect.     Laboratory Data: Lab Results  Component Value Date   CREATININE 0.71 12/04/2021  I have reviewed the labs.      Procedure  Patient is placed in stirrups and her urethral meatus and vulva are cleansed with Betadine. 2%  Lidocaine jelly was inserted into her urethra.  I then dilated her with Elby Showers sounds to a 30 without difficultly.  She tolerated the procedure well.  She will return in 5 months.    Assessment & Plan:   1. Urethral stricture -tolerated well - Return in about 5 months (around 12/25/2021) for urethral dilation.  No follow-ups on file.  [facility] [address] [place],  [address] [phone]

## 2021-12-27 ENCOUNTER — Other Ambulatory Visit: Payer: Self-pay

## 2021-12-27 ENCOUNTER — Ambulatory Visit: Payer: 59 | Admitting: Urology

## 2021-12-27 ENCOUNTER — Encounter: Payer: Self-pay | Admitting: Urology

## 2021-12-27 ENCOUNTER — Ambulatory Visit (INDEPENDENT_AMBULATORY_CARE_PROVIDER_SITE_OTHER): Payer: 59 | Admitting: Urology

## 2021-12-27 VITALS — BP 145/81 | HR 66 | Ht 66.0 in | Wt 281.0 lb

## 2021-12-27 DIAGNOSIS — N3592 Unspecified urethral stricture, female: Secondary | ICD-10-CM | POA: Diagnosis not present

## 2021-12-27 LAB — MICROSCOPIC EXAMINATION: Bacteria, UA: NONE SEEN

## 2021-12-27 LAB — URINALYSIS, COMPLETE
Bilirubin, UA: NEGATIVE
Glucose, UA: NEGATIVE
Ketones, UA: NEGATIVE
Leukocytes,UA: NEGATIVE
Nitrite, UA: NEGATIVE
Protein,UA: NEGATIVE
RBC, UA: NEGATIVE
Specific Gravity, UA: 1.025 (ref 1.005–1.030)
Urobilinogen, Ur: 0.2 mg/dL (ref 0.2–1.0)
pH, UA: 6 (ref 5.0–7.5)

## 2022-01-04 ENCOUNTER — Other Ambulatory Visit: Payer: Self-pay

## 2022-01-04 MED FILL — Diclofenac Sodium Gel 1% (1.16% Diethylamine Equiv): CUTANEOUS | 20 days supply | Qty: 300 | Fill #2 | Status: AC

## 2022-01-23 ENCOUNTER — Encounter: Payer: Self-pay | Admitting: Family Medicine

## 2022-01-23 DIAGNOSIS — R609 Edema, unspecified: Secondary | ICD-10-CM

## 2022-01-29 DIAGNOSIS — M25562 Pain in left knee: Secondary | ICD-10-CM | POA: Diagnosis not present

## 2022-01-29 DIAGNOSIS — M13862 Other specified arthritis, left knee: Secondary | ICD-10-CM | POA: Diagnosis not present

## 2022-02-04 ENCOUNTER — Other Ambulatory Visit: Payer: Self-pay

## 2022-02-05 ENCOUNTER — Other Ambulatory Visit: Payer: Self-pay

## 2022-02-16 ENCOUNTER — Ambulatory Visit (INDEPENDENT_AMBULATORY_CARE_PROVIDER_SITE_OTHER): Payer: 59 | Admitting: Nurse Practitioner

## 2022-02-16 ENCOUNTER — Encounter (INDEPENDENT_AMBULATORY_CARE_PROVIDER_SITE_OTHER): Payer: Self-pay | Admitting: Nurse Practitioner

## 2022-02-16 VITALS — BP 159/74 | HR 65 | Resp 16 | Wt 297.8 lb

## 2022-02-16 DIAGNOSIS — I10 Essential (primary) hypertension: Secondary | ICD-10-CM

## 2022-02-16 DIAGNOSIS — E782 Mixed hyperlipidemia: Secondary | ICD-10-CM

## 2022-02-16 DIAGNOSIS — R252 Cramp and spasm: Secondary | ICD-10-CM | POA: Diagnosis not present

## 2022-03-04 ENCOUNTER — Other Ambulatory Visit: Payer: Self-pay

## 2022-03-05 ENCOUNTER — Other Ambulatory Visit: Payer: Self-pay

## 2022-03-08 ENCOUNTER — Encounter (INDEPENDENT_AMBULATORY_CARE_PROVIDER_SITE_OTHER): Payer: Self-pay | Admitting: Nurse Practitioner

## 2022-03-08 NOTE — Progress Notes (Signed)
Subjective:    Patient ID: Latoya Lutz, female    DOB: 1970/07/20, 51 y.o.   MRN: [id number] Chief Complaint  Patient presents with   New Patient (Initial Visit)    Ref Wynetta Emery consult peripheral edema    Latoya Lutz is a 51 year old female who presents today for evaluation of bilateral lower extremity leg cramps and edema.  She is referred by her primary care provider Dr. Wynetta Emery.  She notes that this has been ongoing for the last 5 years or so.  Initially it was thought that this may be caused by statin use however changes did not cause any effect.  In order to try to help these cramps she has been taking magnesium and Tylenol in the morning.  If the evening she takes some other leg cramping medication with Tylenol and meloxicam.  She notes that these cramping patterns have been worsening over the last few months.  Initially the cramping use to happen approximately once a month but it has begun to happen 2 or 3 nights per week.  It typically happens between 12 and 3 AM and it wakes her up from her sleep.  She notes that the symptoms are worse when she travels that night she has severe cramping.  This cramping mostly takes place in bed.  She also notes that with enough walking and ambulation she has aching with her hips.  Currently there are no open wounds or ulcerations.  She does also endorse having some lower extremity edema as well.    Review of Systems  Cardiovascular:  Positive for leg swelling.  All other systems reviewed and are negative.      Objective:   Physical Exam Vitals reviewed.  HENT:     Head: Normocephalic.  Cardiovascular:     Rate and Rhythm: Normal rate.     Pulses:          Dorsalis pedis pulses are 1+ on the right side and 1+ on the left side.       Posterior tibial pulses are 0 on the right side and 0 on the left side.  Pulmonary:     Effort: Pulmonary effort is normal.  Musculoskeletal:     Right lower leg: 1+ Edema present.     Left lower  leg: 1+ Edema present.  Skin:    General: Skin is warm and dry.  Neurological:     Mental Status: She is alert and oriented to person, place, and time.  Psychiatric:        Mood and Affect: Mood normal.        Behavior: Behavior normal.        Thought Content: Thought content normal.        Judgment: Judgment normal.     BP (!) 159/74 (BP Location: Right Arm)   Pulse 65   Resp 16   Wt 297 lb 12.8 oz (135.1 kg)   BMI 48.07 kg/m   Past Medical History:  Diagnosis Date   Acid reflux    Actinic keratosis    Arthritis    Atypical mole 10/27/2019   Left labia mucosa    Atypical nevus 05/25/2020   R labia majora - atypical proliferation   Candidal dermatitis 05/25/2014   Cervical spine disease    Chicken pox    Chronic urethral narrowing    undergoing stretching   Complication of anesthesia    Dysplastic nevus 04/05/2021   R post med thigh near popliteal, mod to severe atypia  Endometriosis    Dr. Rushie Chestnut at [facility], removed    Esophageal reflux 05/25/2014   Gross hematuria 04/25/2015   H/O cystoscopy    normal   Heart murmur    Hx of basal cell carcinoma 12/29/2008   L low back   Hypertension    Infection of urinary tract 09/11/2014   Kidney stone 09/11/2014   Kidney stones    Microscopic hematuria 09/11/2014   Obesity, morbid, BMI 40.0-49.9 ([place]) 04/10/2012   PONV (postoperative nausea and vomiting)    Right ureteral stone 04/28/2015   Skin cancer    Urinary retention     Social History   Socioeconomic History   Marital status: Married    Spouse name: Not on file   Number of children: Not on file   Years of education: Not on file   Highest education level: Not on file  Occupational History   Not on file  Tobacco Use   Smoking status: Former    Packs/day: 0.50    Years: 17.00    Total pack years: 8.50    Types: Cigarettes    Quit date: 04/27/2008    Years since quitting: 13.8   Smokeless tobacco: Never   Tobacco comments:    quit 7 years   Vaping  Use   Vaping Use: Never used  Substance and Sexual Activity   Alcohol use: No    Alcohol/week: 0.0 standard drinks of alcohol   Drug use: No   Sexual activity: Yes  Other Topics Concern   Not on file  Social History Narrative   Lives in [place] with son 15YO and fiance      Work - [employer]   Social Determinants of Health   Financial Resource Strain: Not on file  Food Insecurity: Not on file  Transportation Needs: Not on file  Physical Activity: Not on file  Stress: Not on file  Social Connections: Not on file  Intimate Partner Violence: Not on file    Past Surgical History:  Procedure Laterality Date   BLADDER SURGERY  07/2003   CARPAL TUNNEL RELEASE Right    CESAREAN SECTION  1998   CHOLECYSTECTOMY     COLONOSCOPY WITH PROPOFOL N/A 08/24/2021   Procedure: COLONOSCOPY WITH PROPOFOL;  Surgeon: Lucilla Lame, MD;  Location: [facility] ENDOSCOPY;  Service: Endoscopy;  Laterality: N/A;  9 AM ARRIVAL, PLEASE   CYSTOSCOPY W/ RETROGRADES Right 05/16/2015   Procedure: CYSTOSCOPY WITH RETROGRADE PYELOGRAM;  Surgeon: Nickie Retort, MD;  Location: [facility] ORS;  Service: Urology;  Laterality: Right;   CYSTOSCOPY W/ URETERAL STENT PLACEMENT Right 05/16/2015   Procedure: CYSTOSCOPY WITH STENT REPLACEMENT;  Surgeon: Nickie Retort, MD;  Location: [facility] ORS;  Service: Urology;  Laterality: Right;   CYSTOSCOPY WITH STENT PLACEMENT Right 04/29/2015   Procedure: CYSTOSCOPY WITH STENT PLACEMENT;  Surgeon: Nickie Retort, MD;  Location: [facility] ORS;  Service: Urology;  Laterality: Right;   ESOPHAGOGASTRODUODENOSCOPY N/A 08/24/2021   Procedure: ESOPHAGOGASTRODUODENOSCOPY (EGD);  Surgeon: Lucilla Lame, MD;  Location: [facility] ENDOSCOPY;  Service: Endoscopy;  Laterality: N/A;   EXTRACORPOREAL SHOCK WAVE LITHOTRIPSY Left 06/02/2015   Procedure: EXTRACORPOREAL SHOCK WAVE LITHOTRIPSY (ESWL);  Surgeon: Hollice Espy, MD;  Location: [facility] ORS;  Service: Urology;  Laterality: Left;   FOOT SURGERY  2003   KNEE  SURGERY  2005   TONSILLECTOMY     TONSILLECTOMY     lingual growth on vocal cord removal   TOTAL ABDOMINAL HYSTERECTOMY W/ BILATERAL SALPINGOOPHORECTOMY     [facility] complete  URETEROSCOPY WITH HOLMIUM LASER LITHOTRIPSY Right 05/16/2015   Procedure: URETEROSCOPY WITH HOLMIUM LASER LITHOTRIPSY;  Surgeon: Nickie Retort, MD;  Location: [facility] ORS;  Service: Urology;  Laterality: Right;    Family History  Problem Relation Age of Onset   Cancer Mother        pancreatic cancer   Heart disease Mother        Pacemaker and defib   Hypertension Mother    Hyperlipidemia Mother    Cancer Father    COPD Father        Lung and brain cancer   Cancer Maternal Grandmother        Colon   Diabetes Paternal Grandmother    Cirrhosis Paternal Grandmother    Cancer Paternal Grandmother    Kidney disease Neg Hx    Bladder Cancer Neg Hx    Breast cancer Neg Hx     Allergies  Allergen Reactions   Atorvastatin Other (See Comments)    Leg cramps       Latest Ref Rng & Units 12/04/2021    4:13 PM 05/19/2021    4:55 PM 01/26/2021    4:28 PM  CBC  WBC 3.4 - 10.8 x10E3/uL 8.1  7.9  9.6   Hemoglobin 11.1 - 15.9 g/dL 12.4  12.6  12.8   Hematocrit 34.0 - 46.6 % 37.8  38.1  39.4   Platelets 150 - 450 x10E3/uL 238  275  244       CMP     Component Value Date/Time   NA 140 12/04/2021 1613   NA 143 04/15/2012 1716   K 4.0 12/04/2021 1613   K 4.0 04/15/2012 1716   CL 100 12/04/2021 1613   CL 107 04/15/2012 1716   CO2 27 12/04/2021 1613   CO2 29 04/15/2012 1716   GLUCOSE 85 12/04/2021 1613   GLUCOSE 102 (H) 09/26/2018 1257   GLUCOSE 85 04/15/2012 1716   BUN 16 12/04/2021 1613   BUN 12 04/15/2012 1716   CREATININE 0.71 12/04/2021 1613   CREATININE 0.79 04/15/2012 1716   CALCIUM 9.3 12/04/2021 1613   CALCIUM 9.2 04/15/2012 1716   PROT 6.5 12/04/2021 1613   PROT 7.8 04/15/2012 1716   ALBUMIN 4.2 12/04/2021 1613   ALBUMIN 3.9 04/15/2012 1716   AST 16 12/04/2021 1613   AST 24 04/15/2012  1716   ALT 17 12/04/2021 1613   ALT 33 04/15/2012 1716   ALKPHOS 92 12/04/2021 1613   ALKPHOS 109 04/15/2012 1716   BILITOT 0.2 12/04/2021 1613   BILITOT 0.2 04/15/2012 1716   GFRNONAA 90 08/05/2020 1621   GFRNONAA >60 04/15/2012 1716   GFRAA 104 08/05/2020 1621   GFRAA >60 04/15/2012 1716     No results found.     Assessment & Plan:   1. Bilateral leg cramps  Recommend:  The patient has atypical pain symptoms for pure atherosclerotic disease. However, on physical exam there is evidence of vascular disease, given the diminished pulses and the edema associated with venous changes of the legs.  Further investigation of the patient's vascular disease is necessary to determine the relationship of the patient's lower extremity symptoms and the degree of vascular disease.  Noninvasive studies of the will be obtained and the patient will follow up with me to review these studies.   The patient should continue walking and begin a more formal exercise program..   2. Essential hypertension Continue antihypertensive medications as already ordered, these medications have been reviewed and there are no changes  at this time.   3. Mixed hyperlipidemia Continue statin as ordered and reviewed, no changes at this time    Current Outpatient Medications on File Prior to Visit  Medication Sig Dispense Refill   acyclovir (ZOVIRAX) 400 MG tablet Take 1 tablet (400 mg total) by mouth 3 (three) times daily. 270 tablet 0   albuterol (VENTOLIN HFA) 108 (90 Base) MCG/ACT inhaler INHALE 2 PUFFS BY MOUTH EVERY 6 HOURS AS NEEDED FOR WHEEZING OR SHORTNESS OF BREATH. 18 g 3   azelastine (ASTELIN) 0.1 % nasal spray PLACE 1 SPRAY INTO BOTH NOSTRILS 2 TIMES DAILY. 30 mL 12   busPIRone (BUSPAR) 10 MG tablet Take 1 tablet (10 mg total) by mouth 3 (three) times daily. 90 tablet 6   Calcium Carbonate (CALCIUM 600 PO) Take 1 tablet by mouth daily.      cyclobenzaprine (FLEXERIL) 10 MG tablet Take 1 tablet (10 mg  total) by mouth at bedtime. 30 tablet 2   diclofenac Sodium (VOLTAREN) 1 % GEL APPLY 2 GRAMS TO THE AFFECTED AREA(S) 4 TIMES A DAY 300 g 2   ezetimibe (ZETIA) 10 MG tablet TAKE 1 TABLET (10 MG TOTAL) BY MOUTH DAILY. 90 tablet 3   fexofenadine (ALLEGRA) 180 MG tablet Take 180 mg by mouth daily.     fluticasone (FLONASE) 50 MCG/ACT nasal spray PLACE 2 SPRAYS INTO BOTH NOSTRILS DAILY. 48 g 3   hydrochlorothiazide (HYDRODIURIL) 25 MG tablet TAKE 1 TABLET BY MOUTH DAILY. 90 tablet 1   Magnesium 100 MG CAPS Take 30 mg by mouth 2 (two) times daily.     meloxicam (MOBIC) 15 MG tablet TAKE 1 TABLET BY MOUTH DAILY. 90 tablet 3   Nutritional Supplements (MENOPAUSE FORMULA) TABS Take by mouth. Reported on 11/04/2015     pantoprazole (PROTONIX) 40 MG tablet Take 2 tablets (80 mg total) by mouth daily. 180 tablet 1   pravastatin (PRAVACHOL) 20 MG tablet TAKE 1 TABLET BY MOUTH DAILY (Patient taking differently: Take 20 mg by mouth 3 (three) times a week. Tuesday,Thursday,Saturday) 90 tablet 1   pseudoephedrine (SUDAFED) 30 MG tablet Take 30 mg by mouth 3 (three) times daily.     Simethicone 180 MG CAPS Take 1 capsule (180 mg total) by mouth 3 (three) times daily with meals. 90 capsule 6   sucralfate (CARAFATE) 1 g tablet TAKE 1 TABLET BY MOUTH 4 TIMES DAILY - WITH MEALS AND AT BEDTIME. 120 tablet 3   UNIFINE PENTIPS 31G X 8 MM MISC      vitamin E 400 UNIT capsule Take 400 Units by mouth daily.     No current facility-administered medications on file prior to visit.    There are no Patient Instructions on file for this visit. No follow-ups on file.   Kris Hartmann, NP

## 2022-03-12 ENCOUNTER — Other Ambulatory Visit: Payer: Self-pay

## 2022-03-14 ENCOUNTER — Ambulatory Visit: Payer: 59 | Admitting: Dermatology

## 2022-03-23 ENCOUNTER — Other Ambulatory Visit: Payer: Self-pay | Admitting: Family Medicine

## 2022-03-23 ENCOUNTER — Other Ambulatory Visit: Payer: Self-pay

## 2022-03-23 MED ORDER — DICLOFENAC SODIUM 1 % EX GEL
CUTANEOUS | 0 refills | Status: DC
  Filled 2022-03-23: qty 300, 30d supply, fill #0

## 2022-03-23 NOTE — Telephone Encounter (Signed)
Requested Prescriptions  Pending Prescriptions Disp Refills  . diclofenac Sodium (VOLTAREN) 1 % GEL 300 g 0    Sig: APPLY 2 GRAMS TO THE AFFECTED AREA(S) 4 TIMES A DAY     Analgesics:  Topicals Failed - 03/23/2022  4:42 AM      Failed - Manual Review: Labs are only required if the patient has taken medication for more than 8 weeks.      Passed - PLT in normal range and within 360 days    Platelets  Date Value Ref Range Status  12/04/2021 238 150 - 450 x10E3/uL Final         Passed - HGB in normal range and within 360 days    Hemoglobin  Date Value Ref Range Status  12/04/2021 12.4 11.1 - 15.9 g/dL Final         Passed - HCT in normal range and within 360 days    Hematocrit  Date Value Ref Range Status  12/04/2021 37.8 34.0 - 46.6 % Final         Passed - Cr in normal range and within 360 days    Creatinine  Date Value Ref Range Status  04/15/2012 0.79 0.60 - 1.30 mg/dL Final   Creatinine, Ser  Date Value Ref Range Status  12/04/2021 0.71 0.57 - 1.00 mg/dL Final   Creatinine,U  Date Value Ref Range Status  06/01/2015 93.7 mg/dL Final         Passed - eGFR is 30 or above and within 360 days    EGFR (African American)  Date Value Ref Range Status  04/15/2012 >60  Final   GFR calc Af Amer  Date Value Ref Range Status  08/05/2020 104 >59 mL/min/1.73 Final    Comment:    **In accordance with recommendations from the NKF-ASN Task force,**   [facility] is in the process of updating its eGFR calculation to the   2021 CKD-EPI creatinine equation that estimates kidney function   without a race variable.    EGFR (Non-African Amer.)  Date Value Ref Range Status  04/15/2012 >60  Final    Comment:    eGFR values <73m/min/1.73 m2 may be an indication of chronic kidney disease (CKD). Calculated eGFR is useful in patients with stable renal function. The eGFR calculation will not be reliable in acutely ill patients when serum creatinine is changing rapidly. It is not useful  in  patients on dialysis. The eGFR calculation may not be applicable to patients at the low and high extremes of body sizes, pregnant women, and vegetarians.    GFR calc non Af Amer  Date Value Ref Range Status  08/05/2020 90 >59 mL/min/1.73 Final   GFR  Date Value Ref Range Status  06/01/2015 82.19 >60.00 mL/min Final   eGFR  Date Value Ref Range Status  12/04/2021 102 >59 mL/min/1.73 Final         Passed - Patient is not pregnant      Passed - Valid encounter within last 12 months    Recent Outpatient Visits          3 months ago Routine general medical [facility] a health care [facility]   10 months ago Essential hypertension   [facility] [place]   1 year ago Generalized abdominal pain   [place]   1 year ago Routine general medical examination at a health care facility  [facility] Johnson, Megan P, DO   1 year ago Lower abdominal pain   [facility] McElwee, Lauren A, NP      Future Appointments            In 1 week Kowalski, David C, MD [facility]   In 2 months McGowan, Shannon A, PA-C [facility] [place]   In 2 months Johnson, Megan P, DO [facility]             

## 2022-03-26 ENCOUNTER — Other Ambulatory Visit (INDEPENDENT_AMBULATORY_CARE_PROVIDER_SITE_OTHER): Payer: Self-pay | Admitting: Nurse Practitioner

## 2022-03-26 DIAGNOSIS — R252 Cramp and spasm: Secondary | ICD-10-CM

## 2022-03-26 DIAGNOSIS — M7989 Other specified soft tissue disorders: Secondary | ICD-10-CM

## 2022-03-28 ENCOUNTER — Ambulatory Visit (INDEPENDENT_AMBULATORY_CARE_PROVIDER_SITE_OTHER): Payer: 59

## 2022-03-28 ENCOUNTER — Ambulatory Visit (INDEPENDENT_AMBULATORY_CARE_PROVIDER_SITE_OTHER): Payer: 59 | Admitting: Nurse Practitioner

## 2022-03-28 ENCOUNTER — Encounter (INDEPENDENT_AMBULATORY_CARE_PROVIDER_SITE_OTHER): Payer: Self-pay | Admitting: Nurse Practitioner

## 2022-03-28 VITALS — BP 148/81 | HR 60 | Resp 16 | Wt 294.2 lb

## 2022-03-28 DIAGNOSIS — R252 Cramp and spasm: Secondary | ICD-10-CM

## 2022-03-28 DIAGNOSIS — I8311 Varicose veins of right lower extremity with inflammation: Secondary | ICD-10-CM

## 2022-03-28 DIAGNOSIS — I8312 Varicose veins of left lower extremity with inflammation: Secondary | ICD-10-CM | POA: Diagnosis not present

## 2022-03-28 DIAGNOSIS — I10 Essential (primary) hypertension: Secondary | ICD-10-CM | POA: Diagnosis not present

## 2022-03-28 DIAGNOSIS — M7989 Other specified soft tissue disorders: Secondary | ICD-10-CM | POA: Diagnosis not present

## 2022-04-05 ENCOUNTER — Ambulatory Visit (INDEPENDENT_AMBULATORY_CARE_PROVIDER_SITE_OTHER): Payer: 59 | Admitting: Dermatology

## 2022-04-05 DIAGNOSIS — I8393 Asymptomatic varicose veins of bilateral lower extremities: Secondary | ICD-10-CM | POA: Diagnosis not present

## 2022-04-05 DIAGNOSIS — L814 Other melanin hyperpigmentation: Secondary | ICD-10-CM | POA: Diagnosis not present

## 2022-04-05 DIAGNOSIS — D225 Melanocytic nevi of trunk: Secondary | ICD-10-CM | POA: Diagnosis not present

## 2022-04-05 DIAGNOSIS — Z1283 Encounter for screening for malignant neoplasm of skin: Secondary | ICD-10-CM

## 2022-04-05 DIAGNOSIS — D229 Melanocytic nevi, unspecified: Secondary | ICD-10-CM

## 2022-04-05 DIAGNOSIS — L578 Other skin changes due to chronic exposure to nonionizing radiation: Secondary | ICD-10-CM

## 2022-04-05 DIAGNOSIS — L720 Epidermal cyst: Secondary | ICD-10-CM | POA: Diagnosis not present

## 2022-04-05 DIAGNOSIS — Z86018 Personal history of other benign neoplasm: Secondary | ICD-10-CM | POA: Diagnosis not present

## 2022-04-05 DIAGNOSIS — L821 Other seborrheic keratosis: Secondary | ICD-10-CM | POA: Diagnosis not present

## 2022-04-05 NOTE — Progress Notes (Signed)
Follow-Up Visit   Subjective  Latoya Lutz is a 51 y.o. female who presents for the following: Annual Exam (History of dysplastic nevi - The patient presents for Total-Body Skin Exam (TBSE) for skin cancer screening and mole check.  The patient has spots, moles and lesions to be evaluated, some may be new or changing and the patient has concerns that these could be cancer./).  The following portions of the chart were reviewed this encounter and updated as appropriate:   Tobacco  Allergies  Meds  Problems  Med Hx  Surg Hx  Fam Hx     Review of Systems:  No other skin or systemic complaints except as noted in HPI or Assessment and Plan.  Objective  Well appearing patient in no apparent distress; mood and affect are within normal limits.  A full examination was performed including scalp, head, eyes, ears, nose, lips, neck, chest, axillae, abdomen, back, buttocks, bilateral upper extremities, bilateral lower extremities, hands, feet, fingers, toes, fingernails, and toenails. All findings within normal limits unless otherwise noted below.  Right Forehead 0.5 x 0.4 cm flesh colored papule     Mid Back Brown macules   Legs Spider veins   Assessment & Plan   History of Dysplastic Nevi - No evidence of recurrence today - Recommend regular full body skin exams - Recommend daily broad spectrum sunscreen SPF 30+ to sun-exposed areas, reapply every 2 hours as needed.  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 Anne Fu [facility] and had additional biopsies done. She states they were not concerned about the results and patient hasn't noticed recurrence. Continue annual vaginal exams with GYN or PCP -patient states today that she will follow with her urology PA Zara Council who sees her every 5 months for a genital  exam.. Reviewed notes/records from [facility] visits. Also recommend following with Zara Council, PA-C, who she see every 5 months.  Epidermal inclusion cyst 0.5 x 0.4 cm flesh-colored papule Right Forehead See photo  benign-appearing.  Recommend observation for change.  Call clinic for appointment for evaluation of any new or changing lesions.  Benign-appearing. Exam most consistent with an epidermal inclusion cyst. Discussed that a cyst is a benign growth that can grow over time and sometimes get irritated or inflamed. Recommend observation if it is not bothersome. Discussed option of surgical excision to remove it if it is growing, symptomatic, or other changes noted. Please call for new or changing lesions so they can be evaluated.  Nevus Mid Back Benign-appearing.  Recommend observation for change.  Call clinic for appointment for evaluation of any new or changing lesions. Stable compared to photos from 10/09/21  Spider veins of both lower extremities Legs She is being follow ed by Eulogio Ditch, NP at [facility]  Lentigines - Scattered tan macules - Due to sun exposure - Benign-appearing, observe - Recommend daily broad spectrum sunscreen SPF 30+ to sun-exposed areas, reapply every 2 hours as needed. - Call for any changes  Seborrheic Keratoses - Stuck-on, waxy, tan-brown papules and/or plaques  - Benign-appearing - Discussed benign etiology and prognosis. - Observe - Call for any changes  Melanocytic Nevi - Tan-brown and/or pink-flesh-colored symmetric macules and papules - Benign appearing on exam today - Observation - Call clinic for new or changing moles - Recommend daily use of  broad spectrum spf 30+ sunscreen to sun-exposed areas.   Hemangiomas - Red papules - Discussed benign nature - Observe - Call for any changes  Actinic Damage - Chronic condition, secondary to cumulative UV/sun exposure - diffuse scaly erythematous macules with underlying  dyspigmentation - Recommend daily broad spectrum sunscreen SPF 30+ to sun-exposed areas, reapply every 2 hours as needed.  - Staying in the shade or wearing long sleeves, sun glasses (UVA+UVB protection) and wide brim hats (4-inch brim around the entire circumference of the hat) are also recommended for sun protection.  - Call for new or changing lesions.  Skin cancer screening performed today.  Return in about 1 year (around 04/06/2023) for TBSE.  I, Ashok Cordia, CMA, am acting as scribe for Sarina Ser, MD . Documentation: I have reviewed the above documentation for accuracy and completeness, and I agree with the above.  Sarina Ser, MD

## 2022-04-05 NOTE — Patient Instructions (Signed)
Due to recent changes in healthcare laws, you may see results of your pathology and/or laboratory studies on MyChart before the doctors have had a chance to review them. We understand that in some cases there may be results that are confusing or concerning to you. Please understand that not all results are received at the same time and often the doctors may need to interpret multiple results in order to provide you with the best plan of care or course of treatment. Therefore, we ask that you please give us 2 business days to thoroughly review all your results before contacting the office for clarification. Should we see a critical lab result, you will be contacted sooner.   If You Need Anything After Your Visit  If you have any questions or concerns for your doctor, please call our main line at [phone] and press option 4 to reach your doctor's medical assistant. If no one answers, please leave a voicemail as directed and we will return your call as soon as possible. Messages left after 4 pm will be answered the following business day.   You may also send us a message via MyChart. We typically respond to MyChart messages within 1-2 business days.  For prescription refills, please ask your pharmacy to contact our office. Our fax number is [phone].  If you have an urgent issue when the clinic is closed that cannot wait until the next business day, you can page your doctor at the number below.    Please note that while we do our best to be available for urgent issues outside of office hours, we are not available 24/7.   If you have an urgent issue and are unable to reach us, you may choose to seek medical care at your doctor's office, retail clinic, urgent care center, or emergency room.  If you have a medical emergency, please immediately call 911 or go to the emergency department.  Pager Numbers  - Dr. Kowalski: [phone]  - Dr. Moye: [phone]  - Dr. Stewart:  [phone]  In the event of inclement weather, please call our main line at [phone] for an update on the status of any delays or closures.  Dermatology Medication Tips: Please keep the boxes that topical medications come in in order to help keep track of the instructions about where and how to use these. Pharmacies typically print the medication instructions only on the boxes and not directly on the medication tubes.   If your medication is too expensive, please contact our office at [phone] option 4 or send us a message through MyChart.   We are unable to tell what your co-pay for medications will be in advance as this is different depending on your insurance coverage. However, we may be able to find a substitute medication at lower cost or fill out paperwork to get insurance to cover a needed medication.   If a prior authorization is required to get your medication covered by your insurance company, please allow us 1-2 business days to complete this process.  Drug prices often vary depending on where the prescription is filled and some pharmacies may offer cheaper prices.  The website www.goodrx.com contains coupons for medications through different pharmacies. The prices here do not account for what the cost may be with help from insurance (it may be cheaper with your insurance), but the website can give you the price if you did not use any insurance.  - You can print the associated coupon and take it with   your prescription to the pharmacy.  - You may also stop by our office during regular business hours and pick up a GoodRx coupon card.  - If you need your prescription sent electronically to a different pharmacy, notify our office through [facility] MyChart or by phone at [phone] option 4.     Si Usted Necesita Algo Despus de Su Visita  Tambin puede enviarnos un mensaje a travs de MyChart. Por lo general respondemos a los mensajes de MyChart en el transcurso de 1 a 2  das hbiles.  Para renovar recetas, por favor pida a su farmacia que se ponga en contacto con nuestra oficina. Nuestro nmero de fax es el [phone].  Si tiene un asunto urgente cuando la clnica est cerrada y que no puede esperar hasta el siguiente da hbil, puede llamar/localizar a su doctor(a) al nmero que aparece a continuacin.   Por favor, tenga en cuenta que aunque hacemos todo lo posible para estar disponibles para asuntos urgentes fuera del horario de oficina, no estamos disponibles las 24 horas del da, los 7 das de la semana.   Si tiene un problema urgente y no puede comunicarse con nosotros, puede optar por buscar atencin mdica  en el consultorio de su doctor(a), en una clnica privada, en un centro de atencin urgente o en una sala de emergencias.  Si tiene una emergencia mdica, por favor llame inmediatamente al 911 o vaya a la sala de emergencias.  Nmeros de bper  - Dr. Kowalski: [phone]  - Dra. Moye: [phone]  - Dra. Stewart: [phone]  En caso de inclemencias del tiempo, por favor llame a nuestra lnea principal al [phone] para una actualizacin sobre el estado de cualquier retraso o cierre.  Consejos para la medicacin en dermatologa: Por favor, guarde las cajas en las que vienen los medicamentos de uso tpico para ayudarle a seguir las instrucciones sobre dnde y cmo usarlos. Las farmacias generalmente imprimen las instrucciones del medicamento slo en las cajas y no directamente en los tubos del medicamento.   Si su medicamento es muy caro, por favor, pngase en contacto con nuestra oficina llamando al [phone] y presione la opcin 4 o envenos un mensaje a travs de MyChart.   No podemos decirle cul ser su copago por los medicamentos por adelantado ya que esto es diferente dependiendo de la cobertura de su seguro. Sin embargo, es posible que podamos encontrar un medicamento sustituto a menor costo o llenar un formulario para que el  seguro cubra el medicamento que se considera necesario.   Si se requiere una autorizacin previa para que su compaa de seguros cubra su medicamento, por favor permtanos de 1 a 2 das hbiles para completar este proceso.  Los precios de los medicamentos varan con frecuencia dependiendo del lugar de dnde se surte la receta y alguna farmacias pueden ofrecer precios ms baratos.  El sitio web www.goodrx.com tiene cupones para medicamentos de diferentes farmacias. Los precios aqu no tienen en cuenta lo que podra costar con la ayuda del seguro (puede ser ms barato con su seguro), pero el sitio web puede darle el precio si no utiliz ningn seguro.  - Puede imprimir el cupn correspondiente y llevarlo con su receta a la farmacia.  - Tambin puede pasar por nuestra oficina durante el horario de atencin regular y recoger una tarjeta de cupones de GoodRx.  - Si necesita que su receta se enve electrnicamente a una farmacia diferente, informe a nuestra oficina a travs de MyChart de [place]   o por telfono llamando al [phone] y presione la opcin 4.  

## 2022-04-07 ENCOUNTER — Encounter (INDEPENDENT_AMBULATORY_CARE_PROVIDER_SITE_OTHER): Payer: Self-pay | Admitting: Nurse Practitioner

## 2022-04-07 NOTE — Progress Notes (Signed)
Subjective:    Patient ID: Latoya Lutz, female    DOB: [birth year], 51 y.o.   MRN: [id number] Chief Complaint  Patient presents with   Follow-up    Ultrasound follow up    Latoya Lutz is a 51 year old female who presents today for evaluation of bilateral lower extremity leg cramps and edema.  She is referred by her primary care provider Dr. Wynetta Emery.  She notes that this has been ongoing for the last 5 years or so.  Initially it was thought that this may be caused by statin use however changes did not cause any effect.  In order to try to help these cramps she has been taking magnesium and Tylenol in the morning.  If the evening she takes some other leg cramping medication with Tylenol and meloxicam.  She notes that these cramping patterns have been worsening over the last few months.  Initially the cramping use to happen approximately once a month but it has begun to happen 2 or 3 nights per week.  It typically happens between 12 and 3 AM and it wakes her up from her sleep.  She notes that the symptoms are worse when she travels that night she has severe cramping.  This cramping mostly takes place in bed.  She also notes that with enough walking and ambulation she has aching with her hips.  Currently there are no open wounds or ulcerations.  She does also endorse having some lower extremity edema as well.  Since her last visit the patient notes that she has been drinking more water with electrolytes and the cramps have decreased but not completely resolved  Today noninvasive studies show an ABI of 1.21 on the right and 1.23 on the left.  The TBI's are normal bilaterally.  There are triphasic tibial artery waveforms bilaterally with normal toe waveforms bilaterally.  The patient has no evidence of DVT or superficial thrombophlebitis bilaterally.  She does have deep venous insufficiency in the left.  There is also superficial venous reflux in the bilateral great saphenous veins.       Review of Systems  Cardiovascular:  Positive for leg swelling.  All other systems reviewed and are negative.      Objective:   Physical Exam Vitals reviewed.  HENT:     Head: Normocephalic.  Cardiovascular:     Rate and Rhythm: Normal rate.     Pulses:          Dorsalis pedis pulses are 1+ on the right side and 1+ on the left side.       Posterior tibial pulses are 1+ on the right side and 1+ on the left side.  Pulmonary:     Effort: Pulmonary effort is normal.  Musculoskeletal:     Right lower leg: 1+ Edema present.     Left lower leg: 1+ Edema present.  Skin:    General: Skin is warm and dry.  Neurological:     Mental Status: She is alert and oriented to person, place, and time.  Psychiatric:        Mood and Affect: Mood normal.        Behavior: Behavior normal.        Thought Content: Thought content normal.        Judgment: Judgment normal.     BP (!) 148/81 (BP Location: Right Arm)   Pulse 60   Resp 16   Wt 294 lb 3.2 oz (133.4 kg)   BMI 47.49 kg/m  Past Medical History:  Diagnosis Date   Acid reflux    Actinic keratosis    Arthritis    Atypical mole 10/27/2019   Left labia mucosa    Atypical nevus 05/25/2020   R labia majora - atypical proliferation   Candidal dermatitis 05/25/2014   Cervical spine disease    Chicken pox    Chronic urethral narrowing    undergoing stretching   Complication of anesthesia    Dysplastic nevus 04/05/2021   R post med thigh near popliteal, mod to severe atypia   Endometriosis    Dr. Rushie Chestnut at [facility], removed    Esophageal reflux 05/25/2014   Gross hematuria 04/25/2015   H/O cystoscopy    normal   Heart murmur    Hx of basal cell carcinoma 12/29/2008   L low back   Hypertension    Infection of urinary tract 09/11/2014   Kidney stone 09/11/2014   Kidney stones    Microscopic hematuria 09/11/2014   Obesity, morbid, BMI 40.0-49.9 ([place]) 04/10/2012   PONV (postoperative nausea and vomiting)    Right ureteral  stone 04/28/2015   Skin cancer    Urinary retention     Social History   Socioeconomic History   Marital status: Married    Spouse name: Not on file   Number of children: Not on file   Years of education: Not on file   Highest education level: Not on file  Occupational History   Not on file  Tobacco Use   Smoking status: Former    Packs/day: 0.50    Years: 17.00    Total pack years: 8.50    Types: Cigarettes    Quit date: 04/27/2008    Years since quitting: 13.9   Smokeless tobacco: Never   Tobacco comments:    quit 7 years   Vaping Use   Vaping Use: Never used  Substance and Sexual Activity   Alcohol use: No    Alcohol/week: 0.0 standard drinks of alcohol   Drug use: No   Sexual activity: Yes  Other Topics Concern   Not on file  Social History Narrative   Lives in [place] with son 15YO and fiance      Work - [employer]   Social Determinants of Health   Financial Resource Strain: Not on file  Food Insecurity: Not on file  Transportation Needs: Not on file  Physical Activity: Not on file  Stress: Not on file  Social Connections: Not on file  Intimate Partner Violence: Not on file    Past Surgical History:  Procedure Laterality Date   BLADDER SURGERY  07/2003   CARPAL TUNNEL RELEASE Right    CESAREAN SECTION  1998   CHOLECYSTECTOMY     COLONOSCOPY WITH PROPOFOL N/A 08/24/2021   Procedure: COLONOSCOPY WITH PROPOFOL;  Surgeon: Lucilla Lame, MD;  Location: [facility];  Service: Endoscopy;  Laterality: N/A;  9 AM ARRIVAL, PLEASE   CYSTOSCOPY W/ RETROGRADES Right 05/16/2015   Procedure: CYSTOSCOPY WITH RETROGRADE PYELOGRAM;  Surgeon: Nickie Retort, MD;  Location: [facility] OR[facility];  Service: Urology;  Laterality: Right;   CYSTOSCOPY W/ URETERAL STENT PLACEMENT Right 05/16/2015   Procedure: CYSTOSCOPY WITH STENT REPLACEMENT;  Surgeon: Nickie Retort, MD;  Location: [facility];  Service: Urology;  Laterality: Right;   CYSTOSCOPY WITH STENT PLACEMENT Right 04/29/2015    Procedure: CYSTOSCOPY WITH STENT PLACEMENT;  Surgeon: Nickie Retort, MD;  Location: [facility];  Service: Urology;  Laterality: Right;   ESOPHAGOGASTRODUODENOSCOPY N/A 08/24/2021  Procedure: ESOPHAGOGASTRODUODENOSCOPY (EGD);  Surgeon: Lucilla Lame, MD;  Location: [facility];  Service: Endoscopy;  Laterality: N/A;   EXTRACORPOREAL SHOCK WAVE LITHOTRIPSY Left 06/02/2015   Procedure: EXTRACORPOREAL SHOCK WAVE LITHOTRIPSY (ESWL);  Surgeon: Hollice Espy, MD;  Location: [facility];  Service: Urology;  Laterality: Left;   FOOT SURGERY  2003   KNEE SURGERY  2005   TONSILLECTOMY     TONSILLECTOMY     lingual growth on vocal cord removal   TOTAL ABDOMINAL HYSTERECTOMY W/ BILATERAL SALPINGOOPHORECTOMY     [facility] complete   URETEROSCOPY WITH HOLMIUM LASER LITHOTRIPSY Right 05/16/2015   Procedure: URETEROSCOPY WITH HOLMIUM LASER LITHOTRIPSY;  Surgeon: Nickie Retort, MD;  Location: [facility] OR[facility];  Service: Urology;  Laterality: Right;    Family History  Problem Relation Age of Onset   Cancer Mother        pancreatic cancer   Heart disease Mother        Pacemaker and defib   Hypertension Mother    Hyperlipidemia Mother    Cancer Father    COPD Father        Lung and brain cancer   Cancer Maternal Grandmother        Colon   Diabetes Paternal Grandmother    Cirrhosis Paternal Grandmother    Cancer Paternal Grandmother    Kidney disease Neg Hx    Bladder Cancer Neg Hx    Breast cancer Neg Hx     Allergies  Allergen Reactions   Atorvastatin Other (See Comments)    Leg cramps       Latest Ref Rng & Units 12/04/2021    4:13 PM 05/19/2021    4:55 PM 01/26/2021    4:28 PM  CBC  WBC 3.4 - 10.8 x10E3/uL 8.1  7.9  9.6   Hemoglobin 11.1 - 15.9 g/dL 12.4  12.6  12.8   Hematocrit 34.0 - 46.6 % 37.8  38.1  39.4   Platelets 150 - 450 x10E3/uL 238  275  244       CMP     Component Value Date/Time   NA 140 12/04/2021 1613   NA 143 04/15/2012 1716   K 4.0 12/04/2021 1613   K 4.0  04/15/2012 1716   CL 100 12/04/2021 1613   CL 107 04/15/2012 1716   CO2 27 12/04/2021 1613   CO2 29 04/15/2012 1716   GLUCOSE 85 12/04/2021 1613   GLUCOSE 102 (H) 09/26/2018 1257   GLUCOSE 85 04/15/2012 1716   BUN 16 12/04/2021 1613   BUN 12 04/15/2012 1716   CREATININE 0.71 12/04/2021 1613   CREATININE 0.79 04/15/2012 1716   CALCIUM 9.3 12/04/2021 1613   CALCIUM 9.2 04/15/2012 1716   PROT 6.5 12/04/2021 1613   PROT 7.8 04/15/2012 1716   ALBUMIN 4.2 12/04/2021 1613   ALBUMIN 3.9 04/15/2012 1716   AST 16 12/04/2021 1613   AST 24 04/15/2012 1716   ALT 17 12/04/2021 1613   ALT 33 04/15/2012 1716   ALKPHOS 92 12/04/2021 1613   ALKPHOS 109 04/15/2012 1716   BILITOT 0.2 12/04/2021 1613   BILITOT 0.2 04/15/2012 1716   GFRNONAA 90 08/05/2020 1621   GFRNONAA >60 04/15/2012 1716   GFRAA 104 08/05/2020 1621   GFRAA >60 04/15/2012 1716     VAS [place] ABI WITH/WO TBI  Result Date: 04/02/2022  [place] STUDY Patient Name:  EMALENE WELTE  Date of Exam:   03/28/2022 Medical Rec #: [id number]  Accession #:    [id number] Date of Birth: Jul 01, 1970            Patient Gender: F Patient Age:   24 years Exam Location:  [facility] Procedure:      VAS [place] ABI WITH/WO TBI Referring Phys: Arna Medici Kody Brandl --------------------------------------------------------------------------------  High Risk Factors: Hypertension, hyperlipidemia, past history of smoking. Other Factors: Bilateral lower extremity edema.  Performing Technologist: Delorise Shiner RVT  Examination Guidelines: A complete evaluation includes at minimum, Doppler waveform signals and systolic blood pressure reading at the level of bilateral brachial, anterior tibial, and posterior tibial arteries, when vessel segments are accessible. Bilateral testing is considered an integral part of a complete examination. Photoelectric Plethysmograph (PPG) waveforms and toe systolic pressure readings are included as required  and additional duplex testing as needed. Limited examinations for reoccurring indications may be performed as noted.  ABI Findings: +---------+------------------+-----+---------+--------+ Right    Rt Pressure (mmHg)IndexWaveform Comment  +---------+------------------+-----+---------+--------+ Brachial 153                                      +---------+------------------+-----+---------+--------+ ATA      188               1.21 triphasic         +---------+------------------+-----+---------+--------+ PTA      181               1.16 triphasic         +---------+------------------+-----+---------+--------+ Great Toe133               0.85                   +---------+------------------+-----+---------+--------+ +---------+------------------+-----+---------+-------+ Left     Lt Pressure (mmHg)IndexWaveform Comment +---------+------------------+-----+---------+-------+ Brachial 156                                     +---------+------------------+-----+---------+-------+ ATA      186               1.19 triphasic        +---------+------------------+-----+---------+-------+ PTA      192               1.23 triphasic        +---------+------------------+-----+---------+-------+ Great Toe137               0.88                  +---------+------------------+-----+---------+-------+ +-------+-----------+-----------+------------+------------+ ABI/TBIToday's ABIToday's TBIPrevious ABIPrevious TBI +-------+-----------+-----------+------------+------------+ Right  1.21       0.85                                +-------+-----------+-----------+------------+------------+ Left   1.23       0.88                                +-------+-----------+-----------+------------+------------+  Summary: Right: Resting right ankle-brachial index is within normal range. The right toe-brachial index is normal. Left: Resting left ankle-brachial index is within normal  range. The left toe-brachial index is normal. *See table(s) above for measurements and observations.  Electronically signed by Hortencia Pilar MD on 04/02/2022 at 1:16:14 PM.    Final  Assessment & Plan:   1. Bilateral leg cramps Based on the noninvasive studies we are aware that the pain discomfort is not related to arterial insufficiency.  Patient advised to continue with utilizing water with electrolytes to help with cramping.  We will have the patient follow-up following conservative therapy as outlined below.  2. Essential hypertension Continue antihypertensive medications as already ordered, these medications have been reviewed and there are no changes at this time.   3. Varicose veins of both lower extremities with inflammation The patient's edema as well as discomfort with cramping may be related to her varicosities.  The patient has not yet engaged in conservative therapy including use of medical grade compression stockings, elevation and activity.  We will have the patient engage in these therapies and return in 3 months with progress to see if her symptoms are somewhat resolved to determine if we should continue with conservative therapy or discussed the use of possible endovenous laser ablation.   Current Outpatient Medications on File Prior to Visit  Medication Sig Dispense Refill   acyclovir (ZOVIRAX) 400 MG tablet Take 1 tablet (400 mg total) by mouth 3 (three) times daily. 270 tablet 0   albuterol (VENTOLIN HFA) 108 (90 Base) MCG/ACT inhaler INHALE 2 PUFFS BY MOUTH EVERY 6 HOURS AS NEEDED FOR WHEEZING OR SHORTNESS OF BREATH. 18 g 3   azelastine (ASTELIN) 0.1 % nasal spray PLACE 1 SPRAY INTO BOTH NOSTRILS 2 TIMES DAILY. 30 mL 12   busPIRone (BUSPAR) 10 MG tablet Take 1 tablet (10 mg total) by mouth 3 (three) times daily. 90 tablet 6   Calcium Carbonate (CALCIUM 600 PO) Take 1 tablet by mouth daily.      cyclobenzaprine (FLEXERIL) 10 MG tablet Take 1 tablet (10 mg total) by  mouth at bedtime. 30 tablet 2   diclofenac Sodium (VOLTAREN) 1 % GEL APPLY 2 GRAMS TO THE AFFECTED AREA(S) 4 TIMES A DAY 300 g 0   ezetimibe (ZETIA) 10 MG tablet TAKE 1 TABLET (10 MG TOTAL) BY MOUTH DAILY. 90 tablet 3   fexofenadine (ALLEGRA) 180 MG tablet Take 180 mg by mouth daily.     fluticasone (FLONASE) 50 MCG/ACT nasal spray PLACE 2 SPRAYS INTO BOTH NOSTRILS DAILY. 48 g 3   hydrochlorothiazide (HYDRODIURIL) 25 MG tablet TAKE 1 TABLET BY MOUTH DAILY. 90 tablet 1   Magnesium 100 MG CAPS Take 30 mg by mouth 2 (two) times daily.     meloxicam (MOBIC) 15 MG tablet TAKE 1 TABLET BY MOUTH DAILY. 90 tablet 3   Nutritional Supplements (MENOPAUSE FORMULA) TABS Take by mouth. Reported on 11/04/2015     pantoprazole (PROTONIX) 40 MG tablet Take 2 tablets (80 mg total) by mouth daily. 180 tablet 1   pravastatin (PRAVACHOL) 20 MG tablet TAKE 1 TABLET BY MOUTH DAILY (Patient taking differently: Take 20 mg by mouth 3 (three) times a week. Tuesday,Thursday,Saturday) 90 tablet 1   pseudoephedrine (SUDAFED) 30 MG tablet Take 30 mg by mouth 3 (three) times daily.     Simethicone 180 MG CAPS Take 1 capsule (180 mg total) by mouth 3 (three) times daily with meals. 90 capsule 6   sucralfate (CARAFATE) 1 g tablet TAKE 1 TABLET BY MOUTH 4 TIMES DAILY - WITH MEALS AND AT BEDTIME. 120 tablet 3   UNIFINE PENTIPS 31G X 8 MM MISC      vitamin E 400 UNIT capsule Take 400 Units by mouth daily.     No current facility-administered medications on file prior to  visit.    There are no Patient Instructions on file for this visit. No follow-ups on file.   Kris Hartmann, NP

## 2022-04-09 ENCOUNTER — Encounter (INDEPENDENT_AMBULATORY_CARE_PROVIDER_SITE_OTHER): Payer: Self-pay

## 2022-04-09 ENCOUNTER — Encounter: Payer: Self-pay | Admitting: Dermatology

## 2022-04-15 ENCOUNTER — Other Ambulatory Visit: Payer: Self-pay

## 2022-04-16 ENCOUNTER — Other Ambulatory Visit: Payer: Self-pay

## 2022-05-07 ENCOUNTER — Other Ambulatory Visit: Payer: Self-pay

## 2022-05-14 ENCOUNTER — Ambulatory Visit
Admission: RE | Admit: 2022-05-14 | Discharge: 2022-05-14 | Disposition: A | Discharge status: Home / Self Care | Payer: 59 | Source: Ambulatory Visit | Attending: Family Medicine | Admitting: Family Medicine

## 2022-05-14 DIAGNOSIS — Z1231 Encounter for screening mammogram for malignant neoplasm of breast: Secondary | ICD-10-CM | POA: Diagnosis not present

## 2022-05-16 DIAGNOSIS — M1712 Unilateral primary osteoarthritis, left knee: Secondary | ICD-10-CM | POA: Diagnosis not present

## 2022-05-18 ENCOUNTER — Other Ambulatory Visit: Payer: Self-pay

## 2022-05-18 ENCOUNTER — Other Ambulatory Visit: Payer: Self-pay | Admitting: Family Medicine

## 2022-05-18 MED ORDER — DICLOFENAC SODIUM 1 % EX GEL
CUTANEOUS | 1 refills | Status: DC
  Filled 2022-05-18: qty 300, 37d supply, fill #0
  Filled 2022-09-04 – 2022-09-19 (×2): qty 300, 37d supply, fill #1

## 2022-05-18 NOTE — Telephone Encounter (Signed)
Requested Prescriptions  Pending Prescriptions Disp Refills   diclofenac Sodium (VOLTAREN) 1 % GEL 300 g 2    Sig: APPLY 2 GRAMS TO THE AFFECTED AREA(S) 4 TIMES A DAY     Analgesics:  Topicals Failed - 05/18/2022  4:41 AM      Failed - Manual Review: Labs are only required if the patient has taken medication for more than 8 weeks.      Passed - PLT in normal range and within 360 days    Platelets  Date Value Ref Range Status  12/04/2021 238 150 - 450 x10E3/uL Final         Passed - HGB in normal range and within 360 days    Hemoglobin  Date Value Ref Range Status  12/04/2021 12.4 11.1 - 15.9 g/dL Final         Passed - HCT in normal range and within 360 days    Hematocrit  Date Value Ref Range Status  12/04/2021 37.8 34.0 - 46.6 % Final         Passed - Cr in normal range and within 360 days    Creatinine  Date Value Ref Range Status  04/15/2012 0.79 0.60 - 1.30 mg/dL Final   Creatinine, Ser  Date Value Ref Range Status  12/04/2021 0.71 0.57 - 1.00 mg/dL Final   Creatinine,U  Date Value Ref Range Status  06/01/2015 93.7 mg/dL Final         Passed - eGFR is 30 or above and within 360 days    EGFR (African American)  Date Value Ref Range Status  04/15/2012 >60  Final   GFR calc Af Amer  Date Value Ref Range Status  08/05/2020 104 >59 mL/min/1.73 Final    Comment:    **In accordance with recommendations from the NKF-ASN Task force,**   [facility] is in the process of updating its eGFR calculation to the   2021 CKD-EPI creatinine equation that estimates kidney function   without a race variable.    EGFR (Non-African Amer.)  Date Value Ref Range Status  04/15/2012 >60  Final    Comment:    eGFR values <35m/min/1.73 m2 may be an indication of chronic kidney disease (CKD). Calculated eGFR is useful in patients with stable renal function. The eGFR calculation will not be reliable in acutely ill patients when serum creatinine is changing rapidly. It is not useful  in  patients on dialysis. The eGFR calculation may not be applicable to patients at the low and high extremes of body sizes, pregnant women, and vegetarians.    GFR calc non Af Amer  Date Value Ref Range Status  08/05/2020 90 >59 mL/min/1.73 Final   GFR  Date Value Ref Range Status  06/01/2015 82.19 >60.00 mL/min Final   eGFR  Date Value Ref Range Status  12/04/2021 102 >59 mL/min/1.73 Final         Passed - Patient is not pregnant      Passed - Valid encounter within last 12 months    Recent Outpatient Visits           5 months ago Routine general medical examination [facility] a health care facility   [facility]   12 months ago Essential hypertension   [place]   1 year ago Generalized abdominal pain   [place], [place]   1 year ago Routine general medical examination at a health care facility  [place] P, DO   1 year ago Lower abdominal pain   [place], Scheryl Darter, NP       Future Appointments             In 1 week [place], Gordan [facility]   In 3 weeks [place], Barb Merino, DO [facility], [place]   In 10 months Ralene Bathe, MD [place]

## 2022-05-26 IMAGING — CR DG CHEST 2V
1 series · 2 of 2 positions shown · non-contrast
Comparison: None.

CLINICAL DATA: Cough and NMNPZ-NG positivity

EXAM:
CHEST - 2 VIEW

[Series 1: dg chest 2 view · 0.14mm/px · 2 of 2 slices shown]
[im 1/2]
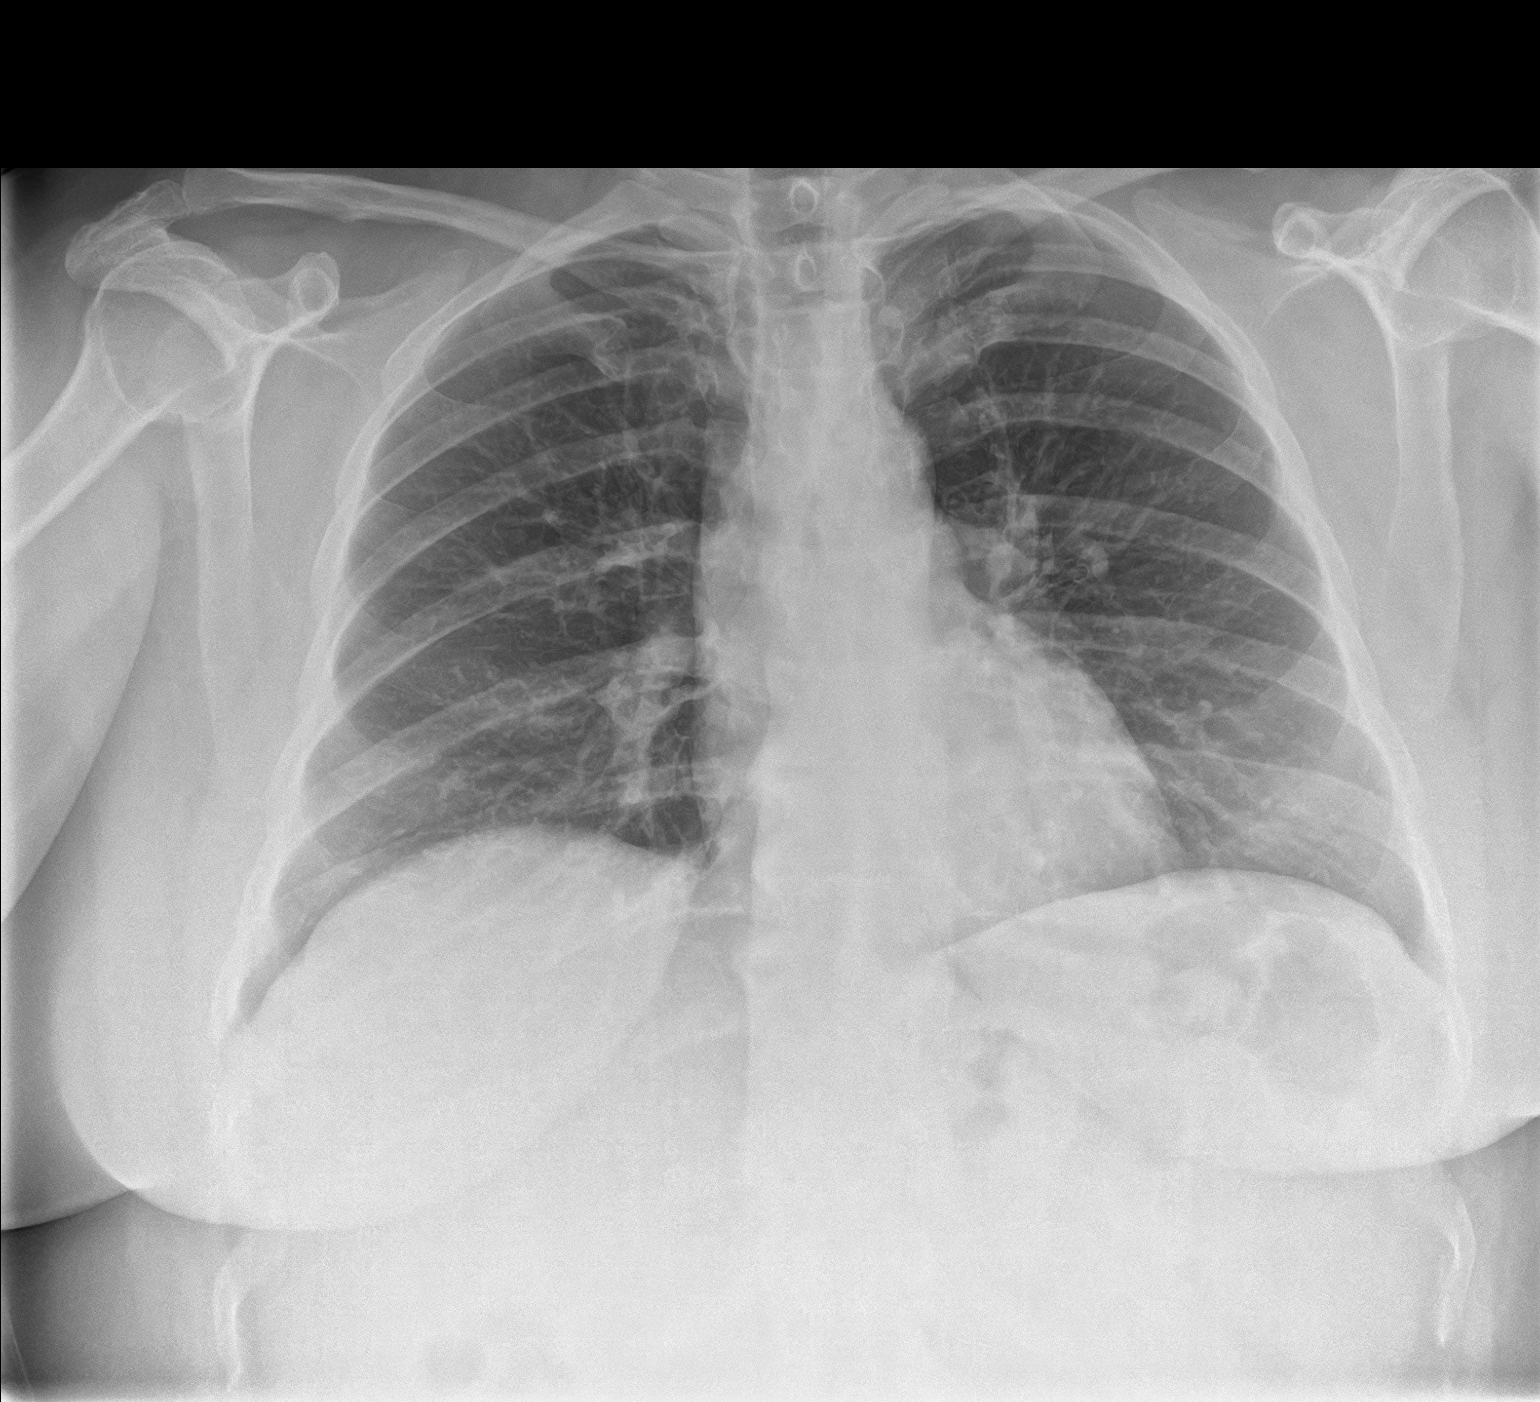
[im 2/2]
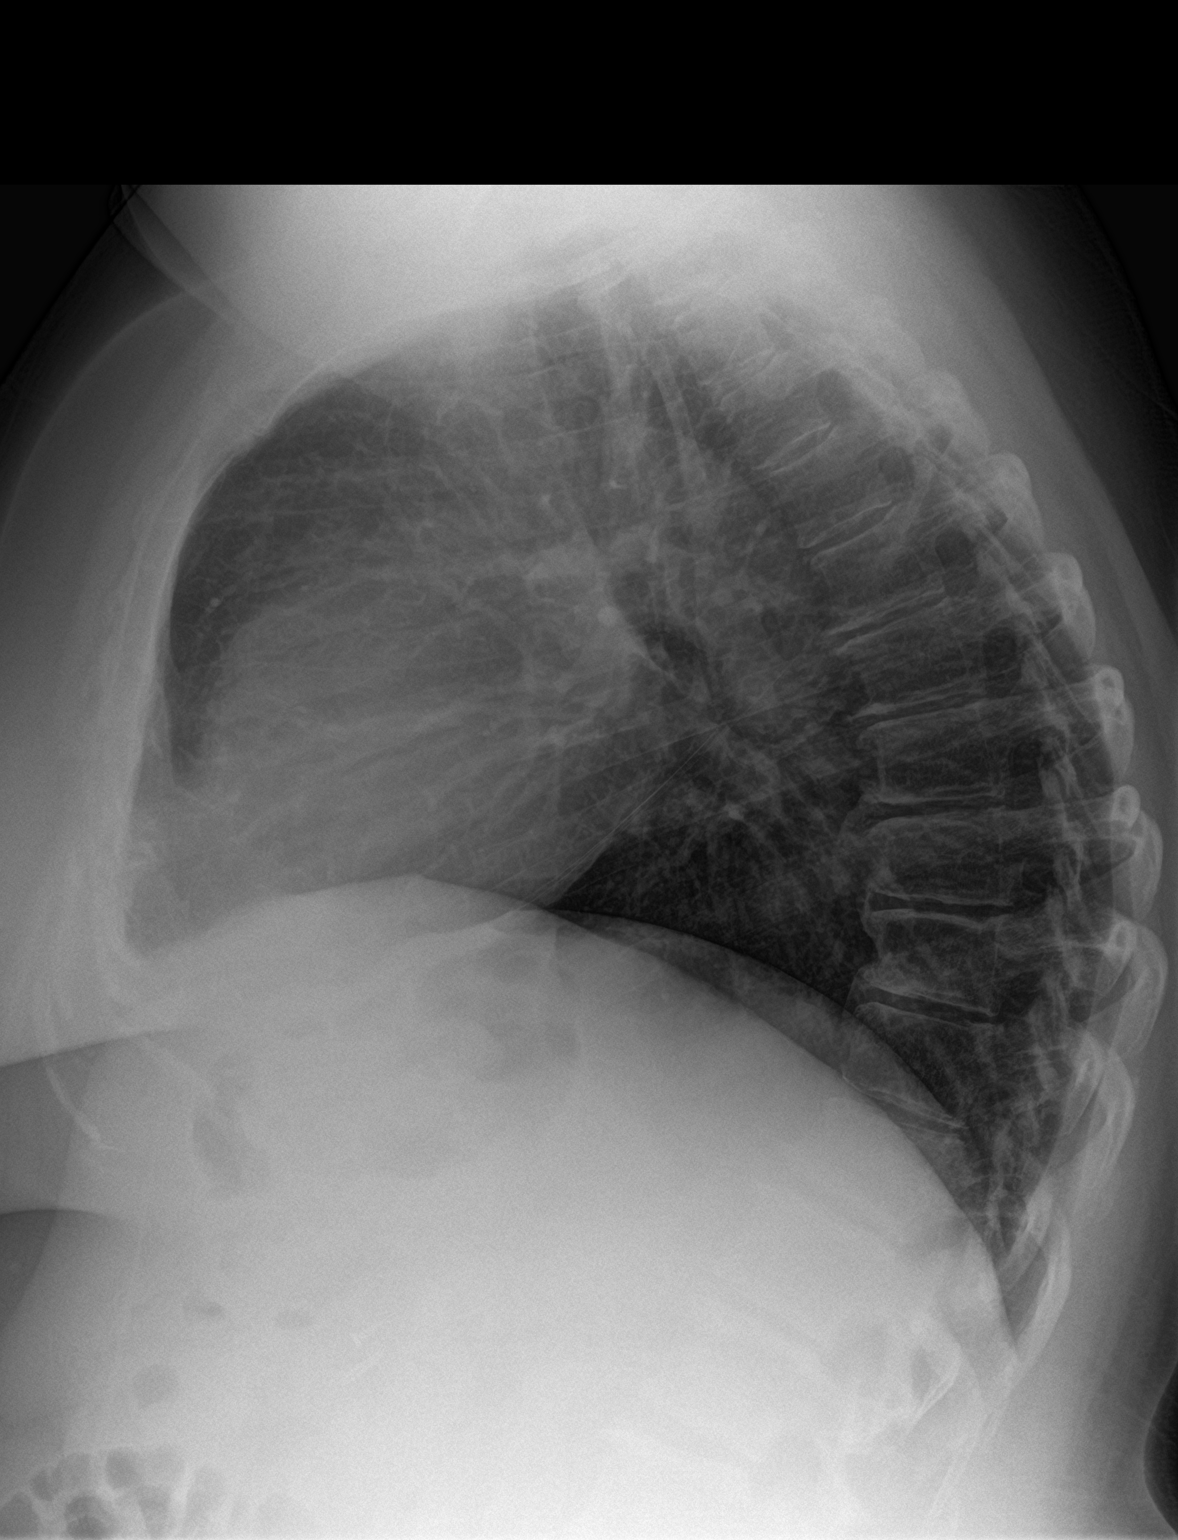

[2 of 2 positions shown; findings below may reference images not displayed]

FINDINGS: The heart size and mediastinal contours are within normal limits.
Both lungs are clear. The visualized skeletal structures are
unremarkable.
IMPRESSION: No active cardiopulmonary disease.

## 2022-05-27 ENCOUNTER — Other Ambulatory Visit: Payer: Self-pay

## 2022-05-27 ENCOUNTER — Other Ambulatory Visit: Payer: Self-pay | Admitting: Family Medicine

## 2022-05-28 ENCOUNTER — Other Ambulatory Visit: Payer: Self-pay

## 2022-05-29 ENCOUNTER — Other Ambulatory Visit: Payer: Self-pay

## 2022-05-29 MED FILL — Sucralfate Tab 1 GM: ORAL | 15 days supply | Qty: 60 | Fill #0 | Status: AC

## 2022-05-29 NOTE — Telephone Encounter (Signed)
Requested Prescriptions  Pending Prescriptions Disp Refills   sucralfate (CARAFATE) 1 g tablet 120 tablet 1    Sig: TAKE 1 TABLET BY MOUTH 4 TIMES DAILY - WITH MEALS AND AT BEDTIME.     Gastroenterology: Antiacids Passed - 05/27/2022  8:48 PM      Passed - Valid encounter within last 12 months    Recent Outpatient Visits           5 months ago Routine general medical examination at a health care facility   [facility], [place], DO   1 year ago Essential hypertension   [place], [place], DO   1 year ago Generalized abdominal pain   [place], Megan P, DO   1 year ago Routine general medical examination at a health care facility   [facility], [place], DO   1 year ago Lower abdominal pain   [place], Scheryl Darter, NP       Future Appointments             Tomorrow Laneta Simmers [place]   In 2 weeks [place], Barb Merino, DO [facility], [place]   In 10 months Ralene Bathe, MD [place]

## 2022-05-29 NOTE — Progress Notes (Signed)
05/30/22 2:25 PM   [name] [birth year] [id number]  Referring provider:  Dorcas Carrow, DO [address] [place],  [place] [address]  Urological history  1. Urethral stricture - diagnosed by Dr. Leonette Monarch with urethral dilation and had been receiving serial dilations every 4 to 6 months since 2013 - managed with urethral dilation q 5 months at this time   2. Nephrolithiasis - right ureteral stones treated with ESWL and right URS/LL/right ureteral stent placement and stent removal in winter 2016 - CT Renal stone study performed on 04/05/2017 noted no nephroureterolithiasis.  No hydronephrosis  Chief Complaint  Patient presents with   Other      HPI: Latoya Lutz is a 51 y.o.female  who presents today for urethral dilation.    Patient denies any modifying or aggravating factors.  Patient denies any gross hematuria, dysuria or suprapubic/flank pain.  Patient denies any fevers, chills, nausea or vomiting.    UA yellow clear, specific area 1.025, trace blood, pH 5.5, 0-5 WBCs, 0-2 RBCs, 0-10 epithelial cells and few bacteria.   PMH: Past Medical History:  Diagnosis Date   Acid reflux    Actinic keratosis    Arthritis    Atypical mole 10/27/2019   Left labia mucosa    Atypical nevus 05/25/2020   R labia majora - atypical proliferation   Candidal dermatitis 05/25/2014   Cervical spine disease    Chicken pox    Chronic urethral narrowing    undergoing stretching   Complication of anesthesia    Dysplastic nevus 04/05/2021   R post med thigh near popliteal, mod to severe atypia   Endometriosis    Dr. Gerrie Nordmann at [facility], removed    Esophageal reflux 05/25/2014   Gross hematuria 04/25/2015   H/O cystoscopy    normal   Heart murmur    Hx of basal cell carcinoma 12/29/2008   L low back   Hypertension    Infection of urinary tract 09/11/2014   Kidney stone 09/11/2014   Kidney stones    Microscopic hematuria 09/11/2014   Obesity, morbid, BMI 40.0-49.9 (HCC)  04/10/2012   PONV (postoperative nausea and vomiting)    Right ureteral stone 04/28/2015   Skin cancer    Urinary retention     Surgical History: Past Surgical History:  Procedure Laterality Date   BLADDER SURGERY  07/2003   CARPAL TUNNEL RELEASE Right    CESAREAN SECTION  1998   CHOLECYSTECTOMY     COLONOSCOPY WITH PROPOFOL N/A 08/24/2021   Procedure: COLONOSCOPY WITH PROPOFOL;  Surgeon: Midge Minium, MD;  Location[facility];  Service: Endoscopy;  Laterality: N/A;  9 AM ARRIVAL, PLEASE   CYSTOSCOPY W/ RETROGRADES Right 05/16/2015   Procedure: CYSTOSCOPY WITH RETROGRADE PYELOGRAM;  Surgeon: Hildred Laser, MD;  Location: [facility] ORS;  Service: Urology;  Laterality: Right;   CYSTOSCOPY W/ URETERAL STENT PLACEMENT Right 05/16/2015   Procedure: CYSTOSCOPY WITH STENT REPLACEMENT;  Surgeon: Hildred Laser, MD;  Location: [facility] ORS;  Service: Urology;  Laterality: Right;   CYSTOSCOPY WITH STENT PLACEMENT Right 04/29/2015   Procedure: CYSTOSCOPY WITH STENT PLACEMENT;  Surgeon: Hildred Laser, MD;  Location: [facility] ORS;  Service: Urology;  Laterality: Right;   ESOPHAGOGASTRODUODENOSCOPY N/A 08/24/2021   Procedure: ESOPHAGOGASTRODUODENOSCOPY (EGD);  Surgeon: Midge Minium, MD;  Location: [facility] ENDOSCOPY;  Service: Endoscopy;  Laterality: N/A;   EXTRACORPOREAL SHOCK WAVE LITHOTRIPSY Left 06/02/2015   Procedure: EXTRACORPOREAL SHOCK WAVE LITHOTRIPSY (ESWL);  Surgeon: Vanna Scotland, MD;  Location: [facility] ORS;  Service: Urology;  Laterality: Left;   FOOT SURGERY  2003   KNEE SURGERY  2005   TONSILLECTOMY     TONSILLECTOMY     lingual growth on vocal cord removal   TOTAL ABDOMINAL HYSTERECTOMY W/ BILATERAL SALPINGOOPHORECTOMY     [facility] complete   URETEROSCOPY WITH HOLMIUM LASER LITHOTRIPSY Right 05/16/2015   Procedure: URETEROSCOPY WITH HOLMIUM LASER LITHOTRIPSY;  Surgeon: Hildred Laser, MD;  Location: [facility] ORS;  Service: Urology;  Laterality: Right;    Home Medications:  Allergies  as of 05/30/2022       Reactions   Atorvastatin Other (See Comments)   Leg cramps        Medication List        Accurate as of May 30, 2022  2:25 PM. If you have any questions, ask your nurse or doctor.          STOP taking these medications    albuterol 108 (90 Base) MCG/ACT inhaler Commonly known as: VENTOLIN HFA Stopped by: Lizet Kelso, PA-C   azelastine 0.1 % nasal spray Commonly known as: ASTELIN Stopped by: Asif Muchow, PA-C       TAKE these medications    acyclovir 400 MG tablet Commonly known as: ZOVIRAX Take 1 tablet (400 mg total) by mouth 3 (three) times daily.   busPIRone 10 MG tablet Commonly known as: BUSPAR Take 1 tablet (10 mg total) by mouth 3 (three) times daily.   CALCIUM 600 PO Take 1 tablet by mouth daily.   cyclobenzaprine 10 MG tablet Commonly known as: FLEXERIL Take 1 tablet (10 mg total) by mouth at bedtime.   diclofenac Sodium 1 % Gel Commonly known as: VOLTAREN APPLY 2 GRAMS TO THE AFFECTED AREA(S) 4 TIMES A DAY   ezetimibe 10 MG tablet Commonly known as: ZETIA TAKE 1 TABLET (10 MG TOTAL) BY MOUTH DAILY.   fexofenadine 180 MG tablet Commonly known as: ALLEGRA Take 180 mg by mouth daily.   fluticasone 50 MCG/ACT nasal spray Commonly known as: FLONASE PLACE 2 SPRAYS INTO BOTH NOSTRILS DAILY.   hydrochlorothiazide 25 MG tablet Commonly known as: HYDRODIURIL TAKE 1 TABLET BY MOUTH DAILY.   Magnesium 100 MG Caps Take 30 mg by mouth 2 (two) times daily.   meloxicam 15 MG tablet Commonly known as: MOBIC TAKE 1 TABLET BY MOUTH DAILY.   Menopause Formula Tabs Take by mouth. Reported on 11/04/2015   pantoprazole 40 MG tablet Commonly known as: PROTONIX Take 2 tablets (80 mg total) by mouth daily.   pravastatin 20 MG tablet Commonly known as: PRAVACHOL TAKE 1 TABLET BY MOUTH DAILY What changed:  how much to take how to take this when to take this additional instructions   pseudoephedrine 30 MG  tablet Commonly known as: SUDAFED Take 30 mg by mouth 3 (three) times daily.   Simethicone 180 MG Caps Take 1 capsule (180 mg total) by mouth 3 (three) times daily with meals.   sucralfate 1 g tablet Commonly known as: CARAFATE TAKE 1 TABLET BY MOUTH 4 TIMES DAILY - WITH MEALS AND AT BEDTIME.   Unifine Pentips 31G X 8 MM Misc Generic drug: Insulin Pen Needle   vitamin E 180 MG (400 UNITS) capsule Take 400 Units by mouth daily.        Allergies:  Allergies  Allergen Reactions   Atorvastatin Other (See Comments)    Leg cramps    Family History: Family History  Problem Relation Age of Onset   Cancer Mother  pancreatic cancer   Heart disease Mother        Pacemaker and defib   Hypertension Mother    Hyperlipidemia Mother    Cancer Father    COPD Father        Lung and brain cancer   Cancer Maternal Grandmother        Colon   Diabetes Paternal Grandmother    Cirrhosis Paternal Grandmother    Cancer Paternal Grandmother    Kidney disease Neg Hx    Bladder Cancer Neg Hx    Breast cancer Neg Hx     Social History:  reports that she quit smoking about 14 years ago. Her smoking use included cigarettes. She has a 8.50 pack-year smoking history. She has never used smokeless tobacco. She reports that she does not drink alcohol and does not use drugs.   Physical Exam: Blood pressure (!) 145/81, pulse 66, height 5\' 6"  (1.676 m), weight 281 lb (127.5 kg).  Constitutional:  Well nourished. Alert and oriented, No acute distress. HEENT: [place] AT, moist mucus membranes.  Trachea midline Cardiovascular: No clubbing, cyanosis, or edema. Respiratory: Normal respiratory effort, no increased work of breathing. Neurologic: Grossly intact, no focal deficits, moving all 4 extremities. Psychiatric: Normal mood and affect.     Laboratory Data: Lab Results  Component Value Date   CREATININE 0.71 12/04/2021  I have reviewed the labs.    Procedure  Patient is placed in  stirrups and her urethral meatus and vulva are cleansed with Betadine. 2% Lidocaine jelly was inserted into her urethra.  I then dilated her with Leta Jungling sounds to a 30 without difficultly.  She tolerated the procedure well.  She will return in 5 months.    Assessment & Plan:   1. Urethral stricture -tolerated well - Return in about 5 months (around 12/25/2021) for urethral dilation.  No follow-ups on file.  [facility] [address], [address] [place], [place] [address] [phone]

## 2022-05-30 ENCOUNTER — Other Ambulatory Visit: Payer: Self-pay

## 2022-05-30 ENCOUNTER — Ambulatory Visit: Payer: 59 | Admitting: Urology

## 2022-05-30 ENCOUNTER — Ambulatory Visit (INDEPENDENT_AMBULATORY_CARE_PROVIDER_SITE_OTHER): Payer: 59 | Admitting: Urology

## 2022-05-30 VITALS — BP 165/74 | HR 78 | Ht 66.0 in | Wt 285.0 lb

## 2022-05-30 DIAGNOSIS — N3592 Unspecified urethral stricture, female: Secondary | ICD-10-CM

## 2022-05-30 LAB — URINALYSIS, COMPLETE
Bilirubin, UA: NEGATIVE
Glucose, UA: NEGATIVE
Ketones, UA: NEGATIVE
Leukocytes,UA: NEGATIVE
Nitrite, UA: NEGATIVE
Protein,UA: NEGATIVE
Specific Gravity, UA: 1.025 (ref 1.005–1.030)
Urobilinogen, Ur: 0.2 mg/dL (ref 0.2–1.0)
pH, UA: 5.5 (ref 5.0–7.5)

## 2022-05-30 LAB — MICROSCOPIC EXAMINATION

## 2022-05-30 MED FILL — Sucralfate Tab 1 GM: ORAL | 15 days supply | Qty: 60 | Fill #0 | Status: AC

## 2022-06-03 ENCOUNTER — Other Ambulatory Visit: Payer: Self-pay | Admitting: Family Medicine

## 2022-06-05 ENCOUNTER — Other Ambulatory Visit: Payer: Self-pay

## 2022-06-05 ENCOUNTER — Ambulatory Visit: Payer: 59 | Admitting: Family Medicine

## 2022-06-05 ENCOUNTER — Other Ambulatory Visit: Payer: Self-pay | Admitting: Family Medicine

## 2022-06-07 ENCOUNTER — Other Ambulatory Visit: Payer: Self-pay

## 2022-06-07 NOTE — Telephone Encounter (Signed)
Requested medication (s) are due for refill today: yes- Duplicate request  Requested medication (s) are on the active medication list: yes  Last refill:  Zetia: 05/19/21 #90 3 RF              HCTZ: 12/04/21 #90 1 RF  Future visit scheduled: yes  Notes to clinic:  Zetia end date 06/12/2022             HCTZ: overdue labs   Requested Prescriptions  Pending Prescriptions Disp Refills   ezetimibe (ZETIA) 10 MG tablet 90 tablet 3    Sig: TAKE 1 TABLET (10 MG TOTAL) BY MOUTH DAILY.     Cardiovascular:  Antilipid - Sterol Transport Inhibitors Failed - 06/07/2022  2:26 PM      Failed - Lipid Panel in normal range within the last 12 months    Cholesterol, Total  Date Value Ref Range Status  12/04/2021 178 100 - 199 mg/dL Final   LDL Chol Calc (NIH)  Date Value Ref Range Status  12/04/2021 102 (H) 0 - 99 mg/dL Final   Direct LDL  Date Value Ref Range Status  05/25/2014 135.7 mg/dL Final    Comment:    Optimal:  <100 mg/dLNear or Above Optimal:  100-129 mg/dLBorderline High:  130-159 mg/dLHigh:  160-189 mg/dLVery High:  >190 mg/dL   HDL  Date Value Ref Range Status  12/04/2021 57 >39 mg/dL Final   Triglycerides  Date Value Ref Range Status  12/04/2021 103 0 - 149 mg/dL Final         Passed - AST in normal range and within 360 days    AST  Date Value Ref Range Status  12/04/2021 16 0 - 40 IU/L Final   SGOT(AST)  Date Value Ref Range Status  04/15/2012 24 15 - 37 Unit/L Final         Passed - ALT in normal range and within 360 days    ALT  Date Value Ref Range Status  12/04/2021 17 0 - 32 IU/L Final   SGPT (ALT)  Date Value Ref Range Status  04/15/2012 33 12 - 78 U/L Final         Passed - Patient is not pregnant      Passed - Valid encounter within last 12 months    Recent Outpatient Visits           6 months ago Routine general medical examination at a health care facility   [place], [place], DO   1 year ago Essential hypertension    [place], [place], DO   1 year ago Generalized abdominal pain   [place], Megan P, DO   1 year ago Routine general medical examination at a health care facility   [facility], [place], DO   1 year ago Lower abdominal pain   [place], Scheryl Darter, NP       Future Appointments             In 1 week Wynetta Emery, Barb Merino, DO [place], [facility]   In 4 months McGowan, Gordan Payment [place]   In 10 months Ralene Bathe, MD [place]             hydrochlorothiazide (HYDRODIURIL) 25 MG tablet 90 tablet 1    Sig: TAKE 1 TABLET BY MOUTH DAILY.     Cardiovascular: Diuretics - Thiazide Failed -  06/07/2022  2:26 PM      Failed - Cr in normal range and within 180 days    Creatinine  Date Value Ref Range Status  04/15/2012 0.79 0.60 - 1.30 mg/dL Final   Creatinine, Ser  Date Value Ref Range Status  12/04/2021 0.71 0.57 - 1.00 mg/dL Final   Creatinine,U  Date Value Ref Range Status  06/01/2015 93.7 mg/dL Final         Failed - K in normal range and within 180 days    Potassium  Date Value Ref Range Status  12/04/2021 4.0 3.5 - 5.2 mmol/L Final  04/15/2012 4.0 3.5 - 5.1 mmol/L Final         Failed - Na in normal range and within 180 days    Sodium  Date Value Ref Range Status  12/04/2021 140 134 - 144 mmol/L Final  04/15/2012 143 136 - 145 mmol/L Final         Failed - Last BP in normal range    BP Readings from Last 1 Encounters:  05/30/22 (!) 165/74         Failed - Valid encounter within last 6 months    Recent Outpatient Visits           6 months ago Routine general medical examination at a health care facility   [place], [place], DO   1 year ago Essential hypertension   [place], [employer], DO   1 year ago Generalized abdominal pain   [place], Megan P, DO    1 year ago Routine general medical examination at a health care facility   [facility], [place], DO   1 year ago Lower abdominal pain   [place], Scheryl Darter, NP       Future Appointments             In 1 week Wynetta Emery, Barb Merino, DO [place], [place]   In 4 months McGowan, Gordan Payment [place]   In 10 months Ralene Bathe, MD [place]

## 2022-06-07 NOTE — Telephone Encounter (Signed)
Patient called in checking on status or refill of these meds.ezetimibe (ZETIA) 10 MG tablet  and hydrochlorothiazide (HYDRODIURIL) 25 MG tablet  She is out of these.  [facility] Phone: [phone]  Fax: [phone]

## 2022-06-07 NOTE — Telephone Encounter (Signed)
Requested medication (s) are due for refill today: yes  Requested medication (s) are on the active medication list: yes  Last refill:  Zetia: 05/19/21 #90 3 RF                                 HCTZ: 12/04/21 #90 1 RF  Future visit scheduled: yes  Notes to clinic:  Zetia rx ends: 06/12/22 BW within date (12/04/21)                       HCTZ: overdue lab work   Requested Prescriptions  Pending Prescriptions Disp Refills   ezetimibe (ZETIA) 10 MG tablet 90 tablet 3    Sig: TAKE 1 TABLET (10 MG TOTAL) BY MOUTH DAILY.     Cardiovascular:  Antilipid - Sterol Transport Inhibitors Failed - 06/07/2022  2:26 PM      Failed - Lipid Panel in normal range within the last 12 months    Cholesterol, Total  Date Value Ref Range Status  12/04/2021 178 100 - 199 mg/dL Final   LDL Chol Calc (NIH)  Date Value Ref Range Status  12/04/2021 102 (H) 0 - 99 mg/dL Final   Direct LDL  Date Value Ref Range Status  05/25/2014 135.7 mg/dL Final    Comment:    Optimal:  <100 mg/dLNear or Above Optimal:  100-129 mg/dLBorderline High:  130-159 mg/dLHigh:  160-189 mg/dLVery High:  >190 mg/dL   HDL  Date Value Ref Range Status  12/04/2021 57 >39 mg/dL Final   Triglycerides  Date Value Ref Range Status  12/04/2021 103 0 - 149 mg/dL Final         Passed - AST in normal range and within 360 days    AST  Date Value Ref Range Status  12/04/2021 16 0 - 40 IU/L Final   SGOT(AST)  Date Value Ref Range Status  04/15/2012 24 15 - 37 Unit/L Final         Passed - ALT in normal range and within 360 days    ALT  Date Value Ref Range Status  12/04/2021 17 0 - 32 IU/L Final   SGPT (ALT)  Date Value Ref Range Status  04/15/2012 33 12 - 78 U/L Final         Passed - Patient is not pregnant      Passed - Valid encounter within last 12 months    Recent Outpatient Visits           6 months ago Routine general medical examination at a health care facility   [place], [place], DO   1  year ago Essential hypertension   [place], [place], DO   1 year ago Generalized abdominal pain   [place], Megan P, DO   1 year ago Routine general medical examination at a health care facility   [facility], [place], DO   1 year ago Lower abdominal pain   [place], Scheryl Darter, NP       Future Appointments             In 1 week Wynetta Emery, Barb Merino, DO [place], [place]   In 4 months McGowan, Gordan Payment [facility]   In 10 months Ralene Bathe, MD [place]  hydrochlorothiazide (HYDRODIURIL) 25 MG tablet 90 tablet 1    Sig: TAKE 1 TABLET BY MOUTH DAILY.     Cardiovascular: Diuretics - Thiazide Failed - 06/07/2022  2:26 PM      Failed - Cr in normal range and within 180 days    Creatinine  Date Value Ref Range Status  04/15/2012 0.79 0.60 - 1.30 mg/dL Final   Creatinine, Ser  Date Value Ref Range Status  12/04/2021 0.71 0.57 - 1.00 mg/dL Final   Creatinine,U  Date Value Ref Range Status  06/01/2015 93.7 mg/dL Final         Failed - K in normal range and within 180 days    Potassium  Date Value Ref Range Status  12/04/2021 4.0 3.5 - 5.2 mmol/L Final  04/15/2012 4.0 3.5 - 5.1 mmol/L Final         Failed - Na in normal range and within 180 days    Sodium  Date Value Ref Range Status  12/04/2021 140 134 - 144 mmol/L Final  04/15/2012 143 136 - 145 mmol/L Final         Failed - Last BP in normal range    BP Readings from Last 1 Encounters:  05/30/22 (!) 165/74         Failed - Valid encounter within last 6 months    Recent Outpatient Visits           6 months ago Routine general medical examination at a health care facility   [place], [place], DO   1 year ago Essential hypertension   [place], [place], DO   1 year ago Generalized abdominal pain   [place], Megan P, DO   1 year ago Routine general medical examination at a health care facility   [facility], [place], DO   1 year ago Lower abdominal pain   [place], Lauren A, NP       Future Appointments             In 1 week Valerie Roys, DO [place], [place]   In 4 months McGowan, Gordan Payment [place]   In 10 months Ralene Bathe, MD [place]

## 2022-06-07 NOTE — Telephone Encounter (Signed)
Can we please check to see if she has enough to get to her appt

## 2022-06-08 ENCOUNTER — Other Ambulatory Visit: Payer: Self-pay

## 2022-06-08 MED FILL — Ezetimibe Tab 10 MG: ORAL | 30 days supply | Qty: 30 | Fill #0 | Status: AC

## 2022-06-08 MED FILL — Hydrochlorothiazide Tab 25 MG: ORAL | 30 days supply | Qty: 30 | Fill #0 | Status: AC

## 2022-06-12 ENCOUNTER — Ambulatory Visit: Payer: 59 | Admitting: Family Medicine

## 2022-06-18 ENCOUNTER — Other Ambulatory Visit: Payer: Self-pay

## 2022-06-18 ENCOUNTER — Encounter: Payer: Self-pay | Admitting: Family Medicine

## 2022-06-18 ENCOUNTER — Ambulatory Visit (INDEPENDENT_AMBULATORY_CARE_PROVIDER_SITE_OTHER): Payer: 59 | Admitting: Family Medicine

## 2022-06-18 VITALS — BP 127/79 | HR 73 | Temp 98.4°F | Ht 66.0 in | Wt 295.4 lb

## 2022-06-18 DIAGNOSIS — Z23 Encounter for immunization: Secondary | ICD-10-CM | POA: Diagnosis not present

## 2022-06-18 DIAGNOSIS — I10 Essential (primary) hypertension: Secondary | ICD-10-CM

## 2022-06-18 DIAGNOSIS — K219 Gastro-esophageal reflux disease without esophagitis: Secondary | ICD-10-CM

## 2022-06-18 DIAGNOSIS — F419 Anxiety disorder, unspecified: Secondary | ICD-10-CM

## 2022-06-18 DIAGNOSIS — E782 Mixed hyperlipidemia: Secondary | ICD-10-CM

## 2022-06-18 DIAGNOSIS — R12 Heartburn: Secondary | ICD-10-CM

## 2022-06-18 MED ORDER — EZETIMIBE 10 MG PO TABS
10.0000 mg | ORAL_TABLET | Freq: Every day | ORAL | 1 refills | Status: DC
  Filled 2022-06-18 – 2022-07-08 (×2): qty 90, 90d supply, fill #0
  Filled 2022-09-30: qty 90, 90d supply, fill #1

## 2022-06-18 MED ORDER — BUSPIRONE HCL 10 MG PO TABS
10.0000 mg | ORAL_TABLET | Freq: Three times a day (TID) | ORAL | 6 refills | Status: DC
  Filled 2022-06-18: qty 90, 30d supply, fill #0

## 2022-06-18 MED ORDER — ACYCLOVIR 400 MG PO TABS
400.0000 mg | ORAL_TABLET | Freq: Three times a day (TID) | ORAL | 1 refills | Status: DC
  Filled 2022-06-18: qty 270, 90d supply, fill #0

## 2022-06-18 MED ORDER — PRAVASTATIN SODIUM 20 MG PO TABS
20.0000 mg | ORAL_TABLET | ORAL | 0 refills | Status: DC
  Filled 2022-06-18: qty 156, 364d supply, fill #0

## 2022-06-18 MED ORDER — HYDROCHLOROTHIAZIDE 25 MG PO TABS
25.0000 mg | ORAL_TABLET | Freq: Every day | ORAL | 1 refills | Status: DC
  Filled 2022-06-18 – 2022-07-08 (×2): qty 90, 90d supply, fill #0
  Filled 2022-10-21: qty 90, 90d supply, fill #1

## 2022-06-18 MED ORDER — PANTOPRAZOLE SODIUM 40 MG PO TBEC
80.0000 mg | DELAYED_RELEASE_TABLET | Freq: Every day | ORAL | 1 refills | Status: DC
  Filled 2022-06-18 – 2022-07-29 (×2): qty 180, 90d supply, fill #0
  Filled 2022-11-02: qty 180, 90d supply, fill #1

## 2022-06-18 NOTE — Assessment & Plan Note (Signed)
Under good control on current regimen. Continue current regimen. Continue to monitor. Call with any concerns. Refills given. Labs drawn today.   

## 2022-06-18 NOTE — Progress Notes (Signed)
BP 127/79   Pulse 73   Temp 98.4 F (36.9 C) (Oral)   Ht '5\' 6"'$  (1.676 m)   Wt 295 lb 6.4 oz (134 kg)   SpO2 97%   BMI 47.68 kg/m    Subjective:    Patient ID: Latoya Lutz, female    DOB: 07/15/1970, 52 y.o.   MRN: [id number]  HPI: Latoya Lutz is a 52 y.o. female  Chief Complaint  Patient presents with   Hypertension   Hyperlipidemia   Anxiety   HYPERTENSION / HYPERLIPIDEMIA Satisfied with current treatment? yes Duration of hypertension: chronic BP monitoring frequency: not checking BP medication side effects: no Past BP meds: HCTZ Duration of hyperlipidemia: chronic Cholesterol medication side effects: no Cholesterol supplements: none Past cholesterol medications: zetia, pravastatin Medication compliance: excellent compliance Aspirin: no Recent stressors: no Recurrent headaches: no Visual changes: no Palpitations: no Dyspnea: no Chest pain: no Lower extremity edema: no Dizzy/lightheaded: no  ANXIETY/STRESS Duration chronic Status:controlled Anxious mood: no  Excessive worrying: no Irritability: no  Sweating: no Nausea: no Palpitations:no Hyperventilation: no Panic attacks: no Agoraphobia: no  Obscessions/compulsions: no Depressed mood: no    06/18/2022    2:34 PM 12/04/2021    3:36 PM 11/01/2020    3:53 PM 10/15/2019    3:21 PM 09/29/2018    3:39 PM  Depression screen PHQ 2/9  Decreased Interest 0 0 0 0 0  Down, Depressed, Hopeless 0 0 0 0 0  PHQ - 2 Score 0 0 0 0 0  Altered sleeping 0 0   0  Tired, decreased energy 0 0   0  Change in appetite 0 0   0  Feeling bad or failure about yourself  0 0   0  Trouble concentrating 0 0   0  Moving slowly or fidgety/restless 0 0   0  Suicidal thoughts 0 0   0  PHQ-9 Score 0 0   0  Difficult doing work/chores Not difficult at all Not difficult at all         06/18/2022    2:35 PM 12/04/2021    3:36 PM 09/29/2018    3:38 PM 03/05/2016    3:03 PM  GAD 7 : Generalized Anxiety Score  Nervous,  Anxious, on Edge 0 0 0 1  Control/stop worrying 0 0 0 0  Worry too much - different things 0 0 0 0  Trouble relaxing 0 0 0 1  Restless 0 0 0 1  Easily annoyed or irritable 0 0 0 1  Afraid - awful might happen 0 0 0 1  Total GAD 7 Score 0 0 0 5  Anxiety Difficulty Not difficult at all Not difficult at all  Not difficult at all   Anhedonia: no Weight changes: no Insomnia:  hard to stay asleep  Hypersomnia: no Fatigue/loss of energy: no Feelings of worthlessness: no Feelings of guilt: no Impaired concentration/indecisiveness: no Suicidal ideations: no  Crying spells: no Recent Stressors/Life Changes: no   Relationship problems: no   Family stress: no     Financial stress: no    Job stress: no    Recent death/loss: no  Relevant past medical, surgical, family and social history reviewed and updated as indicated. Interim medical history since our last visit reviewed. Allergies and medications reviewed and updated.  Review of Systems  Constitutional: Negative.   HENT: Negative.    Respiratory: Negative.    Cardiovascular: Negative.   Gastrointestinal:  Positive for abdominal distention, abdominal pain  and nausea. Negative for anal bleeding, blood in stool, constipation, diarrhea, rectal pain and vomiting.  Musculoskeletal:  Positive for arthralgias. Negative for back pain, gait problem, joint swelling, myalgias, neck pain and neck stiffness.  Skin: Negative.   Neurological: Negative.   Psychiatric/Behavioral: Negative.      Per HPI unless specifically indicated above     Objective:    BP 127/79   Pulse 73   Temp 98.4 F (36.9 C) (Oral)   Ht '5\' 6"'$  (1.676 m)   Wt 295 lb 6.4 oz (134 kg)   SpO2 97%   BMI 47.68 kg/m   Wt Readings from Last 3 Encounters:  06/18/22 295 lb 6.4 oz (134 kg)  05/30/22 285 lb (129.3 kg)  03/28/22 294 lb 3.2 oz (133.4 kg)    Physical Exam Vitals and nursing note reviewed.  Constitutional:      General: She is not in acute distress.     Appearance: Normal appearance. She is not ill-appearing, toxic-appearing or diaphoretic.  HENT:     Head: Normocephalic and atraumatic.     Right Ear: External ear normal.     Left Ear: External ear normal.     Nose: Nose normal.     Mouth/Throat:     Mouth: Mucous membranes are moist.     Pharynx: Oropharynx is clear.  Eyes:     General: No scleral icterus.       Right eye: No discharge.        Left eye: No discharge.     Extraocular Movements: Extraocular movements intact.     Conjunctiva/sclera: Conjunctivae normal.     Pupils: Pupils are equal, round, and reactive to light.  Cardiovascular:     Rate and Rhythm: Normal rate and regular rhythm.     Pulses: Normal pulses.     Heart sounds: Normal heart sounds. No murmur heard.    No friction rub. No gallop.  Pulmonary:     Effort: Pulmonary effort is normal. No respiratory distress.     Breath sounds: Normal breath sounds. No stridor. No wheezing, rhonchi or rales.  Chest:     Chest wall: No tenderness.  Musculoskeletal:        General: Normal range of motion.     Cervical back: Normal range of motion and neck supple.  Skin:    General: Skin is warm and dry.     Capillary Refill: Capillary refill takes less than 2 seconds.     Coloration: Skin is not jaundiced or pale.     Findings: No bruising, erythema, lesion or rash.  Neurological:     General: No focal deficit present.     Mental Status: She is alert and oriented to person, place, and time. Mental status is at baseline.  Psychiatric:        Mood and Affect: Mood normal.        Behavior: Behavior normal.        Thought Content: Thought content normal.        Judgment: Judgment normal.     Results for orders placed or performed in visit on 05/30/22  Microscopic Examination   Urine  Result Value Ref Range   WBC, UA 0-5 0 - 5 /hpf   RBC, Urine 0-2 0 - 2 /hpf   Epithelial Cells (non renal) 0-10 0 - 10 /hpf   Bacteria, UA Few None seen/Few  Urinalysis, Complete   Result Value Ref Range   Specific Gravity, UA 1.025 1.005 - 1.030  pH, UA 5.5 5.0 - 7.5   Color, UA Yellow Yellow   Appearance Ur Clear Clear   Leukocytes,UA Negative Negative   Protein,UA Negative Negative/Trace   Glucose, UA Negative Negative   Ketones, UA Negative Negative   RBC, UA Trace (A) Negative   Bilirubin, UA Negative Negative   Urobilinogen, Ur 0.2 0.2 - 1.0 mg/dL   Nitrite, UA Negative Negative   Microscopic Examination See below:       Assessment & Plan:   Problem List Items Addressed This Visit       Cardiovascular and Mediastinum   Essential hypertension - Primary    Under good control on current regimen. Continue current regimen. Continue to monitor. Call with any concerns. Refills given.  Labs drawn today.       Relevant Medications   hydrochlorothiazide (HYDRODIURIL) 25 MG tablet   ezetimibe (ZETIA) 10 MG tablet   pravastatin (PRAVACHOL) 20 MG tablet   Other Relevant Orders   Comprehensive metabolic panel     Other   Acute anxiety    Under good control on current regimen. Continue current regimen. Continue to monitor. Call with any concerns. Refills given.  Labs drawn today.      Relevant Medications   busPIRone (BUSPAR) 10 MG tablet   Other Relevant Orders   Comprehensive metabolic panel   Hyperlipidemia    Under good control on current regimen. Continue current regimen. Continue to monitor. Call with any concerns. Refills given.  Labs drawn today.      Relevant Medications   hydrochlorothiazide (HYDRODIURIL) 25 MG tablet   ezetimibe (ZETIA) 10 MG tablet   pravastatin (PRAVACHOL) 20 MG tablet   Other Relevant Orders   Comprehensive metabolic panel   Lipid Panel w/o Chol/HDL Ratio   Heartburn    Under good control on current regimen. Continue current regimen. Continue to monitor. Call with any concerns. Refills given.  Labs drawn today.      Relevant Orders   CBC with Differential/Platelet   Comprehensive metabolic panel   Other  Visit Diagnoses     Gastroesophageal reflux disease       Relevant Medications   pantoprazole (PROTONIX) 40 MG tablet        Follow up plan: Return in about 6 months (around 12/17/2022) for physical.

## 2022-06-19 ENCOUNTER — Other Ambulatory Visit: Payer: Self-pay

## 2022-06-19 LAB — COMPREHENSIVE METABOLIC PANEL
ALT: 13 IU/L (ref 0–32)
AST: 16 IU/L (ref 0–40)
Albumin/Globulin Ratio: 2 (ref 1.2–2.2)
Albumin: 4.2 g/dL (ref 3.8–4.9)
Alkaline Phosphatase: 110 IU/L (ref 44–121)
BUN/Creatinine Ratio: 18 (ref 9–23)
BUN: 14 mg/dL (ref 6–24)
Bilirubin Total: 0.2 mg/dL (ref 0.0–1.2)
CO2: 26 mmol/L (ref 20–29)
Calcium: 9 mg/dL (ref 8.7–10.2)
Chloride: 100 mmol/L (ref 96–106)
Creatinine, Ser: 0.8 mg/dL (ref 0.57–1.00)
Globulin, Total: 2.1 g/dL (ref 1.5–4.5)
Glucose: 112 mg/dL — ABNORMAL HIGH (ref 70–99)
Potassium: 3.7 mmol/L (ref 3.5–5.2)
Sodium: 141 mmol/L (ref 134–144)
Total Protein: 6.3 g/dL (ref 6.0–8.5)
eGFR: 89 mL/min/{1.73_m2} (ref >59)

## 2022-06-19 LAB — LIPID PANEL W/O CHOL/HDL RATIO
Cholesterol, Total: 176 mg/dL (ref 100–199)
HDL: 52 mg/dL (ref >39)
LDL Chol Calc (NIH): 97 mg/dL (ref 0–99)
Triglycerides: 154 mg/dL — ABNORMAL HIGH (ref 0–149)
VLDL Cholesterol Cal: 27 mg/dL (ref 5–40)

## 2022-06-19 LAB — CBC WITH DIFFERENTIAL/PLATELET
Basophils Absolute: 0 10*3/uL (ref 0.0–0.2)
Basos: 1 %
EOS (ABSOLUTE): 0.2 10*3/uL (ref 0.0–0.4)
Eos: 2 %
Hematocrit: 38.2 % (ref 34.0–46.6)
Hemoglobin: 12 g/dL (ref 11.1–15.9)
Immature Grans (Abs): 0 10*3/uL (ref 0.0–0.1)
Immature Granulocytes: 0 %
Lymphocytes Absolute: 2.9 10*3/uL (ref 0.7–3.1)
Lymphs: 37 %
MCH: 25.6 pg — ABNORMAL LOW (ref 26.6–33.0)
MCHC: 31.4 g/dL — ABNORMAL LOW (ref 31.5–35.7)
MCV: 82 fL (ref 79–97)
Monocytes Absolute: 0.5 10*3/uL (ref 0.1–0.9)
Monocytes: 6 %
Neutrophils Absolute: 4.4 10*3/uL (ref 1.4–7.0)
Neutrophils: 54 %
Platelets: 291 10*3/uL (ref 150–450)
RBC: 4.68 x10E6/uL (ref 3.77–5.28)
RDW: 13.7 % (ref 11.7–15.4)
WBC: 8 10*3/uL (ref 3.4–10.8)

## 2022-06-22 ENCOUNTER — Encounter: Payer: Self-pay | Admitting: Family Medicine

## 2022-06-25 ENCOUNTER — Other Ambulatory Visit: Payer: Self-pay

## 2022-06-28 ENCOUNTER — Ambulatory Visit (INDEPENDENT_AMBULATORY_CARE_PROVIDER_SITE_OTHER): Payer: 59 | Admitting: Nurse Practitioner

## 2022-07-08 ENCOUNTER — Other Ambulatory Visit: Payer: Self-pay

## 2022-07-09 ENCOUNTER — Other Ambulatory Visit: Payer: Self-pay

## 2022-07-10 ENCOUNTER — Encounter: Payer: Self-pay | Admitting: Family Medicine

## 2022-07-11 ENCOUNTER — Encounter: Payer: Self-pay | Admitting: Family Medicine

## 2022-07-12 ENCOUNTER — Other Ambulatory Visit: Payer: Self-pay

## 2022-07-12 ENCOUNTER — Telehealth: Payer: Self-pay | Admitting: Family Medicine

## 2022-07-12 ENCOUNTER — Encounter: Payer: Self-pay | Admitting: Physician Assistant

## 2022-07-12 ENCOUNTER — Telehealth (INDEPENDENT_AMBULATORY_CARE_PROVIDER_SITE_OTHER): Payer: 59 | Admitting: Physician Assistant

## 2022-07-12 DIAGNOSIS — J069 Acute upper respiratory infection, unspecified: Secondary | ICD-10-CM | POA: Diagnosis not present

## 2022-07-12 MED ORDER — PREDNISONE 20 MG PO TABS
ORAL_TABLET | ORAL | 0 refills | Status: AC
  Filled 2022-07-12: qty 13, 7d supply, fill #0

## 2022-07-12 MED ORDER — AZELASTINE-FLUTICASONE 137-50 MCG/ACT NA SUSP
1.0000 | Freq: Two times a day (BID) | NASAL | 1 refills | Status: DC
  Filled 2022-07-12: qty 23, 30d supply, fill #0

## 2022-07-12 NOTE — Patient Instructions (Addendum)
  Please take the Azelastine nasal spray while you are not feeling well You can continue with the over the counter medications to manage your symptoms and use your inhaler as needed for shortness of breath and coughing spells I have called  in a script for Prednisone to help with your breathing and hopefully reduce your coughing Take this in the morning with breakfast. It can cause sleeplessness if taken later in the day as well as irritability and increased hunger so be mindful of those side effects if they occur.   I hope you start feeling better soon  It was nice to meet you and I appreciate the opportunity to be involved in your care If you were satisfied with the care you received from me, I would greatly appreciate you saying so in the after-visit survey that is sent out following our visit.

## 2022-07-12 NOTE — Progress Notes (Signed)
Virtual Visit via Video Note  I connected with Latoya Lutz on 07/12/22 at  1:40 PM EST by a video enabled telemedicine application and verified that I am speaking with the correct person using two identifiers.  Today's Provider: Talitha Givens, MHS, PA-C Introduced myself to the patient as a PA-C and provided education on APPs in clinical practice.     Location: Patient: At home  Provider: [place], [place]    I discussed the limitations of evaluation and management by telemedicine and the availability of in person appointments. The patient expressed understanding and agreed to proceed.   Chief Complaint  Patient presents with   Cough    Sore Throat started on Sunday.     History of Present Illness:    URI- Type symptoms  Onset: sudden  Duration: Since Sunday Associated Symptoms: Dry cough, sore throat, congestion and rhinorrhea last night, headaches, reduced appetite  Ears felt a little achy  Reports coughing a lot at night  Symptoms are waxing and waning  Chills have resolved as of today  Interventions: Mucinex, Delsym, Sudafed, nasal sprays (flonase and azelastine) COVID testing at home: tested at home Monday and with PCR at [facility] on Monday  Results both were negative   She used her inhaler last night to help with breathing    Review of Systems  Constitutional:  Positive for chills and malaise/fatigue. Negative for fever.  HENT:  Positive for congestion, sinus pain and sore throat.   Respiratory:  Positive for cough. Negative for sputum production, shortness of breath and wheezing.   Gastrointestinal:  Positive for diarrhea (Mild yesterday and today). Negative for nausea and vomiting.  Musculoskeletal:  Negative for myalgias.  Neurological:  Positive for headaches.    Observations/Objective:  Due to the nature of the virtual visit, physical exam and observations are limited. Able to obtain the following observations:   Alert,  oriented, Appears comfortable, in no acute distress.  No scleral injection, tachypnea, wheeze or strider. Able to maintain conversation without visible strain.  Patient sounds hoarse during apt  No cough appreciated during visit.     Assessment and Plan:  Problem List Items Addressed This Visit   None Visit Diagnoses     Viral upper respiratory tract infection    -  Primary Acute new concern Reports symptoms comprised of dry cough, sore throat, congestion, rhinorrhea since Sunday Due to nature of virtual visit I was not able to test for Flu or COVID but she is or would be outside of therapeutic window for both of these at time of test results so this was not offered  Recommend she continue with OTC medications for symptomatic management - provided refill of Azelastine nasal spray refill to assist with congestion and nasal symptoms Will also send in Prednisone taper to assist with breathing as she has had to use her rescue inhaler to assist with symptoms If sore throat is not improving after about 3-5 days, she can contact office for strep testing  Follow up as needed for persistent or progressing symptoms     Relevant Medications   predniSONE (DELTASONE) 20 MG tablet   Azelastine-Fluticasone 137-50 MCG/ACT SUSP      Follow Up Instructions:    I discussed the assessment and treatment plan with the patient. The patient was provided an opportunity to ask questions and all were answered. The patient agreed with the plan and demonstrated an understanding of the instructions.   The patient was advised to call  back or seek an in-person evaluation if the symptoms worsen or if the condition fails to improve as anticipated.  I provided 16 minutes of non-face-to-face time during this encounter.  No follow-ups on file.   I, Presley Gora E Janeah Kovacich, PA-C, have reviewed all documentation for this visit. The documentation on 07/12/22 for the exam, diagnosis, procedures, and orders are all accurate and  complete.   Talitha Givens, MHS, PA-C [facility]

## 2022-07-12 NOTE — Telephone Encounter (Signed)
Patient states the med called in, Azelastine-Fluticasone 137-50 MCG/ACT SUSP  isn't approved with her insurance needs PA or she states can, just the Azelastine by itself be sent in and won't need a PA as told to her by the pharmacy. Please call back discuss.

## 2022-07-13 ENCOUNTER — Telehealth: Payer: Self-pay | Admitting: Family Medicine

## 2022-07-13 ENCOUNTER — Other Ambulatory Visit: Payer: Self-pay

## 2022-07-13 MED ORDER — AZELASTINE HCL 0.1 % NA SOLN
2.0000 | Freq: Two times a day (BID) | NASAL | 12 refills | Status: AC
  Filled 2022-07-13: qty 30, 30d supply, fill #0
  Filled 2023-04-19: qty 30, 30d supply, fill #1

## 2022-07-13 NOTE — Telephone Encounter (Signed)
error 

## 2022-07-29 ENCOUNTER — Other Ambulatory Visit: Payer: Self-pay

## 2022-07-30 ENCOUNTER — Other Ambulatory Visit: Payer: Self-pay

## 2022-07-30 ENCOUNTER — Telehealth (INDEPENDENT_AMBULATORY_CARE_PROVIDER_SITE_OTHER): Payer: 59 | Admitting: Nurse Practitioner

## 2022-07-30 ENCOUNTER — Ambulatory Visit: Payer: Self-pay | Admitting: *Deleted

## 2022-07-30 ENCOUNTER — Encounter: Payer: Self-pay | Admitting: Nurse Practitioner

## 2022-07-30 DIAGNOSIS — U071 COVID-19: Secondary | ICD-10-CM | POA: Diagnosis not present

## 2022-07-30 MED ORDER — BENZONATATE 200 MG PO CAPS
200.0000 mg | ORAL_CAPSULE | Freq: Two times a day (BID) | ORAL | 0 refills | Status: DC | PRN
  Filled 2022-07-30: qty 20, 10d supply, fill #0

## 2022-07-30 MED ORDER — PREDNISONE 20 MG PO TABS
ORAL_TABLET | ORAL | 0 refills | Status: AC
  Filled 2022-07-30: qty 6, 3d supply, fill #0

## 2022-07-30 NOTE — Telephone Encounter (Signed)
  Chief Complaint: Positive Covid.   Requesting prednisone to help clear her symptoms faster so she can get back to work and it not get worse.    Already missed a week of work due to laryngitis a couple of weeks ago.   After the 5 day isolation "I need to be able to get back to work".    I can return [place] [employer]. Per employee health if I'm better. Symptoms: nasal congestion with clear mucus, coughing with clear mucus.    Frequency: Symptoms started Friday.   "I thought I had a sinus infection from being sick a couple of weeks ago but I tested and it was positive". Pertinent Negatives: Patient denies Fever, body aches. Disposition: []$ ED /[]$ Urgent Care (no appt availability in office) / []$ Appointment(In office/virtual)/ []$  [facility]/ []$ Home Care/ []$ Refused Recommended Disposition /[]$ [facility]/ [x]$  Follow-up with PCP Additional Notes: Message sent to Dr. Park Liter at [facility] for prednisone or something to help clear up her symptoms so she can return to work on [employer].   The prednisone they gave her a couple of weeks ago for the URI really helped.   She uses the [facility] [employer], employee pharmacy.

## 2022-07-30 NOTE — Telephone Encounter (Signed)
Pt calling back. No available virtual appts until Thursday. Pt concerned as pharmacy closes at 5:30 "I would like to started on something soon."  Attempted to reach practice, extended ring. Please advise.

## 2022-07-30 NOTE — Telephone Encounter (Signed)
Message from Sharene Skeans sent at 07/30/2022  9:11 AM EST  Summary: covid   Pt now has covid and was seen a couple weeks ago for cold symptoms / pt asked if a Zpack can be called in for her/ or if she needs another virtual please advise          Call History   Type Contact Phone/Fax User  07/30/2022 09:11 AM EST Phone (Incoming) Latoya Lutz, Latoya Lutz "[name]" (Self) [phone] Jerilynn Mages) Alanda Slim E   Reason for Disposition  [1] HIGH RISK patient (e.g., weak immune system, age > 60 years, obesity with BMI 30 or higher, pregnant, chronic lung disease or other chronic medical condition) AND [2] COVID symptoms (e.g., cough, fever)  (Exceptions: Already seen by PCP and no new or worsening symptoms.)    Wanting to know if Dr. Wynetta Emery would call in some prednisone for her.   Positive Covid.   See notes for details.  Answer Assessment - Initial Assessment Questions 1. COVID-19 DIAGNOSIS: "How do you know that you have COVID?" (e.g., positive lab test or self-test, diagnosed by doctor or NP/PA, symptoms after exposure).     Covid positive.      2 weeks ago I had a virtual visit because I had sore throat, cough, etc.    She called in prednisone and I got better.   I felt good for a week.    My husband got Covid.    And now I had Covid.    2. COVID-19 EXPOSURE: "Was there any known exposure to COVID before the symptoms began?" CDC Definition of close contact: within 6 feet (2 meters) for a total of 15 minutes or more over a 24-hour period.      My husband My head, nose and coughing.     I don't want to take the Paxlovid.     Bad head cold and blowing clear mucus.   Doing nose sprays Flonase and one other.    I'm taking OTC Vicks Sinus Severe.    I tried Mucinex did not help.     3. ONSET: "When did the COVID-19 symptoms start?"      Friday.     I thought I had a sinus infection but my mucus is clear.     Then I decided to test for Covid and last night it was positive.    I missed a week due to  laryngitis.    I'm out until Wed per Employee Health at [facility]   I work at the hospital.     I'm taking Delsym for the cough which is helping.      I was wondering if Dr. Wynetta Emery would call me in some Prednisone.    4. WORST SYMPTOM: "What is your worst symptom?" (e.g., cough, fever, shortness of breath, muscle aches)     Coughing and nasal congestion 5. COUGH: "Do you have a cough?" If Yes, ask: "How bad is the cough?"       Yes  But but it's clear mucus I'm coughing up and blowing from my nose. 6. FEVER: "Do you have a fever?" If Yes, ask: "What is your temperature, how was it measured, and when did it start?"     No 7. RESPIRATORY STATUS: "Describe your breathing?" (e.g., normal; shortness of breath, wheezing, unable to speak)      A little short of breath from the congestion.  But I'm not having to stop and catch my breath or anything like  that.    No chest tightness. 8. BETTER-SAME-WORSE: "Are you getting better, staying the same or getting worse compared to yesterday?"  If getting worse, ask, "In what way?"     I got better then got Covid from my husband but I'm not having all the symptoms most other people are having. 9. OTHER SYMPTOMS: "Do you have any other symptoms?"  (e.g., chills, fatigue, headache, loss of smell or taste, muscle pain, sore throat)     Just headache, sinus congestion, and coughing. 10. HIGH RISK DISEASE: "Do you have any chronic medical problems?" (e.g., asthma, heart or lung disease, weak immune system, obesity, etc.)       Not asked 11. VACCINE: "Have you had the COVID-19 vaccine?" If Yes, ask: "Which one, how many shots, when did you get it?"       Not asked 12. PREGNANCY: "Is there any chance you are pregnant?" "When was your last menstrual period?"       Not asked due to age 52. O2 SATURATION MONITOR:  "Do you use an oxygen saturation monitor (pulse oximeter) at home?" If Yes, ask "What is your reading (oxygen level) today?" "What is your usual oxygen  saturation reading?" (e.g., 95%)       Not asked  Protocols used: Coronavirus (COVID-19) Diagnosed or Suspected-A-AH

## 2022-07-30 NOTE — Telephone Encounter (Signed)
Patient scheduled and aware of appointment.

## 2022-07-30 NOTE — Telephone Encounter (Signed)
Please call and schedule the patient a virtual appointment ASAP.

## 2022-07-30 NOTE — Progress Notes (Signed)
There were no vitals taken for this visit.   Subjective:    Patient ID: Latoya Lutz, female    DOB: 1970/10/16, 52 y.o.   MRN: [id number]  HPI: Latoya Lutz is a 52 y.o. female  Chief Complaint  Patient presents with   Covid Positive    Patient says she tested positive for COVID yesterday and she became symptomatic on Friday night. Patient says she is having sinus pressure headache, cough and nasal congestion. Patient declines any body-aches, fever and chills. Patient has tried Mucinex and Sudafed.    Nasal Congestion   Cough   Patient says she tested positive for COVID yesterday and she became symptomatic on Friday night. Patient says she is having sinus pressure headache, cough and nasal congestion. Patient declines any body-aches, fever and chills. Patient has tried Mucinex and Sudafed.    Relevant past medical, surgical, family and social history reviewed and updated as indicated. Interim medical history since our last visit reviewed. Allergies and medications reviewed and updated.  Review of Systems  Constitutional:  Negative for fever.  HENT:  Positive for congestion.   Respiratory:  Positive for cough.   Neurological:  Positive for headaches.    Per HPI unless specifically indicated above     Objective:    There were no vitals taken for this visit.  Wt Readings from Last 3 Encounters:  06/18/22 295 lb 6.4 oz (134 kg)  05/30/22 285 lb (129.3 kg)  03/28/22 294 lb 3.2 oz (133.4 kg)    Physical Exam Vitals and nursing note reviewed.  HENT:     Head: Normocephalic.     Right Ear: Hearing normal.     Left Ear: Hearing normal.     Nose: Nose normal.  Eyes:     Pupils: Pupils are equal, round, and reactive to light.  Pulmonary:     Effort: Pulmonary effort is normal. No respiratory distress.  Neurological:     Mental Status: She is alert.  Psychiatric:        Mood and Affect: Mood normal.        Behavior: Behavior normal.        Thought Content:  Thought content normal.        Judgment: Judgment normal.     Results for orders placed or performed in visit on 06/18/22  CBC with Differential/Platelet  Result Value Ref Range   WBC 8.0 3.4 - 10.8 x10E3/uL   RBC 4.68 3.77 - 5.28 x10E6/uL   Hemoglobin 12.0 11.1 - 15.9 g/dL   Hematocrit 38.2 34.0 - 46.6 %   MCV 82 79 - 97 fL   MCH 25.6 (L) 26.6 - 33.0 pg   MCHC 31.4 (L) 31.5 - 35.7 g/dL   RDW 13.7 11.7 - 15.4 %   Platelets 291 150 - 450 x10E3/uL   Neutrophils 54 Not Estab. %   Lymphs 37 Not Estab. %   Monocytes 6 Not Estab. %   Eos 2 Not Estab. %   Basos 1 Not Estab. %   Neutrophils Absolute 4.4 1.4 - 7.0 x10E3/uL   Lymphocytes Absolute 2.9 0.7 - 3.1 x10E3/uL   Monocytes Absolute 0.5 0.1 - 0.9 x10E3/uL   EOS (ABSOLUTE) 0.2 0.0 - 0.4 x10E3/uL   Basophils Absolute 0.0 0.0 - 0.2 x10E3/uL   Immature Granulocytes 0 Not Estab. %   Immature Grans (Abs) 0.0 0.0 - 0.1 x10E3/uL  Comprehensive metabolic panel  Result Value Ref Range   Glucose 112 (H) 70 - 99  mg/dL   BUN 14 6 - 24 mg/dL   Creatinine, Ser 0.80 0.57 - 1.00 mg/dL   eGFR 89 >59 mL/min/1.73   BUN/Creatinine Ratio 18 9 - 23   Sodium 141 134 - 144 mmol/L   Potassium 3.7 3.5 - 5.2 mmol/L   Chloride 100 96 - 106 mmol/L   CO2 26 20 - 29 mmol/L   Calcium 9.0 8.7 - 10.2 mg/dL   Total Protein 6.3 6.0 - 8.5 g/dL   Albumin 4.2 3.8 - 4.9 g/dL   Globulin, Total 2.1 1.5 - 4.5 g/dL   Albumin/Globulin Ratio 2.0 1.2 - 2.2   Bilirubin Total 0.2 0.0 - 1.2 mg/dL   Alkaline Phosphatase 110 44 - 121 IU/L   AST 16 0 - 40 IU/L   ALT 13 0 - 32 IU/L  Lipid Panel w/o Chol/HDL Ratio  Result Value Ref Range   Cholesterol, Total 176 100 - 199 mg/dL   Triglycerides 154 (H) 0 - 149 mg/dL   HDL 52 >39 mg/dL   VLDL Cholesterol Cal 27 5 - 40 mg/dL   LDL Chol Calc (NIH) 97 0 - 99 mg/dL      Assessment & Plan:   Problem List Items Addressed This Visit   None Visit Diagnoses     COVID-19    -  Primary   Complete course of prednisone.   Continue with OTC symptom mangement.  Stay well hydrated and rest.        Follow up plan: Return if symptoms worsen or fail to improve.   This visit was completed via MyChart due to the restrictions of the COVID-19 pandemic. All issues as above were discussed and addressed. Physical exam was done as above through visual confirmation on MyChart. If it was felt that the patient should be evaluated in the office, they were directed there. The patient verbally consented to this visit. Location of the patient: Home Location of the provider: Office Those involved with this call:  Provider: Jon Billings, NP CMA: Irena Reichmann, [place] Desk/Registration: Lynnell Catalan This encounter was conducted via video.  I spent 20 dedicated to the care of this patient on the date of this encounter to include previsit review of symptoms, plan of care and follow up, face to face time with the patient, and post visit ordering of testing.

## 2022-07-30 NOTE — Telephone Encounter (Signed)
Can either of you work this patient in for a virtual please?

## 2022-08-02 ENCOUNTER — Other Ambulatory Visit: Payer: Self-pay

## 2022-09-04 ENCOUNTER — Other Ambulatory Visit: Payer: Self-pay

## 2022-09-17 ENCOUNTER — Ambulatory Visit: Payer: 59 | Admitting: Family Medicine

## 2022-09-19 ENCOUNTER — Other Ambulatory Visit: Payer: Self-pay

## 2022-10-04 ENCOUNTER — Ambulatory Visit: Payer: 59 | Admitting: Family Medicine

## 2022-10-04 ENCOUNTER — Encounter: Payer: Self-pay | Admitting: Family Medicine

## 2022-10-04 ENCOUNTER — Other Ambulatory Visit: Payer: Self-pay

## 2022-10-04 VITALS — BP 140/74 | HR 79 | Temp 98.6°F | Ht 66.0 in | Wt 295.8 lb

## 2022-10-04 DIAGNOSIS — J040 Acute laryngitis: Secondary | ICD-10-CM | POA: Diagnosis not present

## 2022-10-04 MED ORDER — TRIAMCINOLONE ACETONIDE 40 MG/ML IJ SUSP
40.0000 mg | Freq: Once | INTRAMUSCULAR | Status: AC
  Administered 2022-10-04: 40 mg via INTRAMUSCULAR

## 2022-10-04 MED ORDER — PREDNISONE 50 MG PO TABS
50.0000 mg | ORAL_TABLET | Freq: Every day | ORAL | 0 refills | Status: DC
  Filled 2022-10-04: qty 5, 5d supply, fill #0

## 2022-10-04 NOTE — Progress Notes (Signed)
BP (!) 140/74   Pulse 79   Temp 98.6 F (37 C) (Oral)   Ht  (1.676 m)   Wt 295 lb 12.8 oz (134.2 kg)   SpO2 98%   BMI 47.74 kg/m    Subjective:    Patient ID: Latoya Lutz, female    DOB: [birth year], 52 y.o.   MRN: [id number]  HPI: Latoya Lutz is a 52 y.o. female  Chief Complaint  Patient presents with   Hoarse    Patient says she became hoarse started last night. Patient says this is the third time since has had the same symptoms since the beginning of the year. Patient says she is currently taking Claritin, Mucinex 12 hr, Sudafed and Tylenol. Patient says her symptoms started with a cough first around Tuesday.    Ear Pain   UPPER RESPIRATORY TRACT INFECTION Duration: 2 days Worst symptom: hoarseness Fever: no Cough: no Shortness of breath: yes Wheezing: yes Chest pain: no Chest tightness: yes Chest congestion: yes Nasal congestion: no Runny nose: no Post nasal drip: no Sneezing: no Sore throat: no Swollen glands: no Sinus pressure: no Headache: no Face pain: no Toothache: no Ear pain: no  Ear pressure: no  Eyes red/itching:no Eye drainage/crusting: no  Vomiting: no Rash: no Fatigue: yes Sick contacts: yes Strep contacts: no  Context: worse Recurrent sinusitis: no Relief with OTC cold/cough medications: no  Treatments attempted: cold/sinus, mucinex, anti-histamine, pseudoephedrine, and cough syrup   Relevant past medical, surgical, family and social history reviewed and updated as indicated. Interim medical history since our last visit reviewed. Allergies and medications reviewed and updated.  Review of Systems  Constitutional: Negative.   HENT:  Positive for voice change. Negative for congestion, dental problem, drooling, ear discharge, ear pain, facial swelling, hearing loss, mouth sores, nosebleeds, postnasal drip, rhinorrhea, sinus pressure, sinus pain, sneezing, sore throat, tinnitus and trouble swallowing.   Respiratory:  Negative.    Cardiovascular: Negative.   Gastrointestinal: Negative.   Musculoskeletal: Negative.   Skin: Negative.   Psychiatric/Behavioral: Negative.      Per HPI unless specifically indicated above     Objective:    BP (!) 140/74   Pulse 79   Temp 98.6 F (37 C) (Oral)   Ht  (1.676 m)   Wt 295 lb 12.8 oz (134.2 kg)   SpO2 98%   BMI 47.74 kg/m   Wt Readings from Last 3 Encounters:  10/04/22 295 lb 12.8 oz (134.2 kg)  06/18/22 295 lb 6.4 oz (134 kg)  05/30/22 285 lb (129.3 kg)    Physical Exam Vitals and nursing note reviewed.  Constitutional:      General: She is not in acute distress.    Appearance: Normal appearance. She is obese. She is not ill-appearing, toxic-appearing or diaphoretic.  HENT:     Head: Normocephalic and atraumatic.     Right Ear: Tympanic membrane, ear canal and external ear normal.     Left Ear: Tympanic membrane, ear canal and external ear normal.     Nose: Nose normal. No congestion or rhinorrhea.     Mouth/Throat:     Mouth: Mucous membranes are moist.     Pharynx: Oropharynx is clear. No oropharyngeal exudate or posterior oropharyngeal erythema.  Eyes:     General: No scleral icterus.       Right eye: No discharge.        Left eye: No discharge.     Extraocular Movements: Extraocular movements intact.  Conjunctiva/sclera: Conjunctivae normal.     Pupils: Pupils are equal, round, and reactive to light.  Cardiovascular:     Rate and Rhythm: Normal rate and regular rhythm.     Pulses: Normal pulses.     Heart sounds: Normal heart sounds. No murmur heard.    No friction rub. No gallop.  Pulmonary:     Effort: Pulmonary effort is normal. No respiratory distress.     Breath sounds: Normal breath sounds. No stridor. No wheezing, rhonchi or rales.  Chest:     Chest wall: No tenderness.  Musculoskeletal:        General: Normal range of motion.     Cervical back: Normal range of motion and neck supple.  Skin:    General: Skin is  warm and dry.     Capillary Refill: Capillary refill takes less than 2 seconds.     Coloration: Skin is not jaundiced or pale.     Findings: No bruising, erythema, lesion or rash.  Neurological:     General: No focal deficit present.     Mental Status: She is alert and oriented to person, place, and time. Mental status is at baseline.  Psychiatric:        Mood and Affect: Mood normal.        Behavior: Behavior normal.        Thought Content: Thought content normal.        Judgment: Judgment normal.     Results for orders placed or performed in visit on 06/18/22  CBC with Differential/Platelet  Result Value Ref Range   WBC 8.0 3.4 - 10.8 x10E3/uL   RBC 4.68 3.77 - 5.28 x10E6/uL   Hemoglobin 12.0 11.1 - 15.9 g/dL   Hematocrit 16.1 09.6 - 46.6 %   MCV 82 79 - 97 fL   MCH 25.6 (L) 26.6 - 33.0 pg   MCHC 31.4 (L) 31.5 - 35.7 g/dL   RDW 04.5 40.9 - 81.1 %   Platelets 291 150 - 450 x10E3/uL   Neutrophils 54 Not Estab. %   Lymphs 37 Not Estab. %   Monocytes 6 Not Estab. %   Eos 2 Not Estab. %   Basos 1 Not Estab. %   Neutrophils Absolute 4.4 1.4 - 7.0 x10E3/uL   Lymphocytes Absolute 2.9 0.7 - 3.1 x10E3/uL   Monocytes Absolute 0.5 0.1 - 0.9 x10E3/uL   EOS (ABSOLUTE) 0.2 0.0 - 0.4 x10E3/uL   Basophils Absolute 0.0 0.0 - 0.2 x10E3/uL   Immature Granulocytes 0 Not Estab. %   Immature Grans (Abs) 0.0 0.0 - 0.1 x10E3/uL  Comprehensive metabolic panel  Result Value Ref Range   Glucose 112 (H) 70 - 99 mg/dL   BUN 14 6 - 24 mg/dL   Creatinine, Ser 9.14 0.57 - 1.00 mg/dL   eGFR 89 >78 GN/FAO/1.30   BUN/Creatinine Ratio 18 9 - 23   Sodium 141 134 - 144 mmol/L   Potassium 3.7 3.5 - 5.2 mmol/L   Chloride 100 96 - 106 mmol/L   CO2 26 20 - 29 mmol/L   Calcium 9.0 8.7 - 10.2 mg/dL   Total Protein 6.3 6.0 - 8.5 g/dL   Albumin 4.2 3.8 - 4.9 g/dL   Globulin, Total 2.1 1.5 - 4.5 g/dL   Albumin/Globulin Ratio 2.0 1.2 - 2.2   Bilirubin Total 0.2 0.0 - 1.2 mg/dL   Alkaline Phosphatase 110 44  - 121 IU/L   AST 16 0 - 40 IU/L   ALT 13 0 -  32 IU/L  Lipid Panel w/o Chol/HDL Ratio  Result Value Ref Range   Cholesterol, Total 176 100 - 199 mg/dL   Triglycerides 161 (H) 0 - 149 mg/dL   HDL 52 >09 mg/dL   VLDL Cholesterol Cal 27 5 - 40 mg/dL   LDL Chol Calc (NIH) 97 0 - 99 mg/dL      Assessment & Plan:   Problem List Items Addressed This Visit   None Visit Diagnoses     Laryngitis    -  Primary   Will treat with prednisone. This is the 3rd bout of laryngitis she has had in 3 months. History of vocal cord polyp. Will get her back to ENT. Call w concerns.   Relevant Medications   triamcinolone acetonide (KENALOG-40) injection 40 mg (Start on 10/04/2022  3:45 PM)   Other Relevant Orders   Ambulatory referral to ENT        Follow up plan: Return if symptoms worsen or fail to improve.

## 2022-10-22 NOTE — Progress Notes (Unsigned)
10/23/22 3:33 PM   Latoya Lutz 1970/11/27 [id number]  Referring provider:  Dorcas Carrow, DO [address] [place],  [place] [address]  Urological history  1. Urethral stricture - diagnosed by Dr. Leonette Monarch with urethral dilation and had been receiving serial dilations every 4 to 6 months since 2013 - managed with urethral dilation q 5 months at this time   2. Nephrolithiasis - right ureteral stones treated with ESWL and right URS/LL/right ureteral stent placement and stent removal in winter 2016 - CT Renal stone study performed on 04/05/2017 noted no nephroureterolithiasis.  No hydronephrosis  Chief Complaint  Patient presents with   Other      HPI: Latoya Lutz is a 52 y.o.female  who presents today for urethral dilation.    She has some baseline frequency.  Patient denies any modifying or aggravating factors.  Patient denies any gross hematuria, dysuria or suprapubic/flank pain.  Patient denies any fevers, chills, nausea or vomiting.    Urinalysis color yellow slightly cloudy, specific gravity greater than 1.030, pH 6.0, 1+ protein, 0-5 WBCs, 0-2 RBCs, 0-10 epithelial cells, hyaline casts present, amorphous sediment present, mucus that is present with moderate bacteria  PMH: Past Medical History:  Diagnosis Date   Acid reflux    Actinic keratosis    Arthritis    Atypical mole 10/27/2019   Left labia mucosa    Atypical nevus 05/25/2020   R labia majora - atypical proliferation   Candidal dermatitis 05/25/2014   Cervical spine disease    Chicken pox    Chronic urethral narrowing    undergoing stretching   Complication of anesthesia    Dysplastic nevus 04/05/2021   R post med thigh near popliteal, mod to severe atypia   Endometriosis    [facility]. Gerrie Nordmann at [facility], removed    Esophageal reflux 05/25/2014   Gross hematuria 04/25/2015   H/O cystoscopy    normal   Heart murmur    Hx of basal cell carcinoma 12/29/2008   L low back   Hypertension    Infection of  urinary tract 09/11/2014   Kidney stone 09/11/2014   Kidney stones    Microscopic hematuria 09/11/2014   Obesity, morbid, BMI 40.0-49.9 (HCC) 04/10/2012   PONV (postoperative nausea and vomiting)    Right ureteral stone 04/28/2015   Skin cancer    Urinary retention     Surgical History: Past Surgical History:  Procedure Laterality Date   BLADDER SURGERY  07/2003   CARPAL TUNNEL RELEASE Right    CESAREAN SECTION  1998   CHOLECYSTECTOMY     COLONOSCOPY WITH PROPOFOL N/A 08/24/2021   Procedure: COLONOSCOPY WITH PROPOFOL;  Surgeon: Midge Minium, MD;  Location: [facility] ENDOSCOPY;  Service: Endoscopy;  Laterality: N/A;  9 AM ARRIVAL, PLEASE   CYSTOSCOPY W/ RETROGRADES Right 05/16/2015   Procedure: CYSTOSCOPY WITH RETROGRADE PYELOGRAM;  Surgeon: Hildred Laser, MD;  Location: [facility] ORS;  Service: Urology;  Laterality: Right;   CYSTOSCOPY W/ URETERAL STENT PLACEMENT Right 05/16/2015   Procedure: CYSTOSCOPY WITH STENT REPLACEMENT;  Surgeon: Hildred Laser, MD;  Location: [facility] ORS;  Service: Urology;  Laterality: Right;   CYSTOSCOPY WITH STENT PLACEMENT Right 04/29/2015   Procedure: CYSTOSCOPY WITH STENT PLACEMENT;  Surgeon: Hildred Laser, MD;  Location: [facility] ORS;  Service: Urology;  Laterality: Right;   ESOPHAGOGASTRODUODENOSCOPY N/A 08/24/2021   Procedure: ESOPHAGOGASTRODUODENOSCOPY (EGD);  Surgeon: Midge Minium, MD;  Location: [facility] ENDOSCOPY;  Service: Endoscopy;  Laterality: N/A;   EXTRACORPOREAL SHOCK WAVE LITHOTRIPSY Left  06/02/2015   Procedure: EXTRACORPOREAL SHOCK WAVE LITHOTRIPSY (ESWL);  Surgeon: Vanna Scotland, MD;  Location: [facility] ORS;  Service: Urology;  Laterality: Left;   FOOT SURGERY  2003   KNEE SURGERY  2005   TONSILLECTOMY     TONSILLECTOMY     lingual growth on vocal cord removal   TOTAL ABDOMINAL HYSTERECTOMY W/ BILATERAL SALPINGOOPHORECTOMY     [facility] complete   URETEROSCOPY WITH HOLMIUM LASER LITHOTRIPSY Right 05/16/2015   Procedure: URETEROSCOPY WITH HOLMIUM  LASER LITHOTRIPSY;  Surgeon: Hildred Laser, MD;  Location: [facility] ORS;  Service: Urology;  Laterality: Right;    Home Medications:  Allergies as of 10/23/2022       Reactions   Atorvastatin Other (See Comments)   Leg cramps        Medication List        Accurate as of Oct 23, 2022  3:33 PM. If you have any questions, ask your nurse or doctor.          acyclovir 400 MG tablet Commonly known as: ZOVIRAX Take 1 tablet (400 mg total) by mouth 3 (three) times daily.   azelastine 0.1 % nasal spray Commonly known as: ASTELIN Place 2 sprays into both nostrils 2 (two) times daily. Use in each nostril as directed   busPIRone 10 MG tablet Commonly known as: BUSPAR Take 1 tablet (10 mg total) by mouth 3 (three) times daily.   CALCIUM 600 PO Take 1 tablet by mouth daily.   cyclobenzaprine 10 MG tablet Commonly known as: FLEXERIL Take 1 tablet (10 mg total) by mouth at bedtime.   diclofenac Sodium 1 % Gel Commonly known as: VOLTAREN APPLY 2 GRAMS TO THE AFFECTED AREA(S) 4 TIMES A DAY   ezetimibe 10 MG tablet Commonly known as: ZETIA Take 1 tablet (10 mg total) by mouth daily.   fexofenadine 180 MG tablet Commonly known as: ALLEGRA Take 180 mg by mouth daily.   fluticasone 50 MCG/ACT nasal spray Commonly known as: FLONASE PLACE 2 SPRAYS INTO BOTH NOSTRILS DAILY.   hydrochlorothiazide 25 MG tablet Commonly known as: HYDRODIURIL Take 1 tablet (25 mg total) by mouth daily.   Magnesium 100 MG Caps Take 30 mg by mouth 2 (two) times daily.   Menopause Formula Tabs Take by mouth. Reported on 11/04/2015   nitrofurantoin (macrocrystal-monohydrate) 100 MG capsule Commonly known as: MACROBID Take 1 capsule (100 mg total) by mouth every 12 (twelve) hours. Started by: Michiel Cowboy, PA-C   pantoprazole 40 MG tablet Commonly known as: PROTONIX Take 2 tablets (80 mg total) by mouth daily.   pravastatin 20 MG tablet Commonly known as: PRAVACHOL Take 1 tablet (20 mg  total) by mouth 3 (three) times a week. Tuesday,Thursday,Saturday   predniSONE 50 MG tablet Commonly known as: DELTASONE Take 1 tablet (50 mg total) by mouth daily with breakfast.   pseudoephedrine 30 MG tablet Commonly known as: SUDAFED Take 30 mg by mouth 3 (three) times daily.   Simethicone 180 MG Caps Take 1 capsule (180 mg total) by mouth 3 (three) times daily with meals.   sucralfate 1 g tablet Commonly known as: CARAFATE TAKE 1 TABLET BY MOUTH 4 TIMES DAILY - WITH MEALS AND AT BEDTIME.   Unifine Pentips 31G X 8 MM Misc Generic drug: Insulin Pen Needle   vitamin E 180 MG (400 UNITS) capsule Take 400 Units by mouth daily.        Allergies:  Allergies  Allergen Reactions   Atorvastatin Other (See Comments)  Leg cramps    Family History: Family History  Problem Relation Age of Onset   Cancer Mother        pancreatic cancer   Heart disease Mother        Pacemaker and defib   Hypertension Mother    Hyperlipidemia Mother    Cancer Father    COPD Father        Lung and brain cancer   Cancer Maternal Grandmother        Colon   Diabetes Paternal Grandmother    Cirrhosis Paternal Grandmother    Cancer Paternal Grandmother    Kidney disease Neg Hx    Bladder Cancer Neg Hx    Breast cancer Neg Hx     Social History:  reports that she quit smoking about 14 years ago. Her smoking use included cigarettes. She has a 8.50 pack-year smoking history. She has never used smokeless tobacco. She reports that she does not drink alcohol and does not use drugs.   Physical Exam: Blood pressure (!) 145/81, pulse 66, height 5\' 6"  (1.676 m), weight 281 lb (127.5 kg).  Constitutional:  Well nourished. Alert and oriented, No acute distress. HEENT:  AT, moist mucus membranes.  Trachea midline Cardiovascular: No clubbing, cyanosis, or edema. Respiratory: Normal respiratory effort, no increased work of breathing. Neurologic: Grossly intact, no focal deficits, moving all 4  extremities. Psychiatric: Normal mood and affect.     Laboratory Data: Urinalysis  See EPIC and HPI I have reviewed the labs.   Pertinent Imaging N/A  Procedure  Patient is placed in stirrups and her urethral meatus and vulva are cleansed with Betadine.  2% Lidocaine jelly was inserted into her urethra.  I then dilated her with Leta Jungling sounds to a 30 without difficultly.  She tolerated the procedure well.  She will return in 5 months.   Assessment & Plan:    1. Urethral stricture -she stated that she had more discomfort during this dilation than previous -UA with bacteriuria,  so I have sent the urine for culture, she is going to the beach next week, so I have sent in script for Macrobid to take if any symptoms develop -Return in about 5 months for urethral dilation.  Return in about 5 months (around 03/25/2023) for dilation .  [facility] [address], [address] [place], [place] [address] [phone]

## 2022-10-23 ENCOUNTER — Encounter: Payer: Self-pay | Admitting: Urology

## 2022-10-23 ENCOUNTER — Telehealth: Payer: Self-pay

## 2022-10-23 ENCOUNTER — Ambulatory Visit (INDEPENDENT_AMBULATORY_CARE_PROVIDER_SITE_OTHER): Payer: 59 | Admitting: Urology

## 2022-10-23 ENCOUNTER — Other Ambulatory Visit: Payer: Self-pay

## 2022-10-23 VITALS — BP 147/82 | HR 68 | Ht 65.0 in | Wt 285.0 lb

## 2022-10-23 DIAGNOSIS — R8271 Bacteriuria: Secondary | ICD-10-CM

## 2022-10-23 DIAGNOSIS — N3592 Unspecified urethral stricture, female: Secondary | ICD-10-CM | POA: Diagnosis not present

## 2022-10-23 LAB — URINALYSIS, COMPLETE
Bilirubin, UA: NEGATIVE
Glucose, UA: NEGATIVE
Ketones, UA: NEGATIVE
Leukocytes,UA: NEGATIVE
Nitrite, UA: NEGATIVE
RBC, UA: NEGATIVE
Specific Gravity, UA: >1.03 — ABNORMAL HIGH (ref 1.005–1.030)
Urobilinogen, Ur: 1 mg/dL (ref 0.2–1.0)
pH, UA: 6 (ref 5.0–7.5)

## 2022-10-23 LAB — MICROSCOPIC EXAMINATION

## 2022-10-23 MED ORDER — NITROFURANTOIN MONOHYD MACRO 100 MG PO CAPS
100.0000 mg | ORAL_CAPSULE | Freq: Two times a day (BID) | ORAL | 0 refills | Status: DC
Start: 2022-10-23 — End: 2022-12-24
  Filled 2022-10-23: qty 14, 7d supply, fill #0

## 2022-10-23 NOTE — Telephone Encounter (Signed)
Patient left a voicemail that she will need a diflucan prescription sent in since she had the antibiotic prescribed. She would like this sent to [facility] pharmacy. Please call once medication has been sent. [phone].

## 2022-10-24 ENCOUNTER — Other Ambulatory Visit: Payer: Self-pay | Admitting: Urology

## 2022-10-24 ENCOUNTER — Other Ambulatory Visit: Payer: Self-pay

## 2022-10-24 MED ORDER — FLUCONAZOLE 150 MG PO TABS
150.0000 mg | ORAL_TABLET | Freq: Once | ORAL | 0 refills | Status: AC
  Filled 2022-10-24: qty 3, 3d supply, fill #0

## 2022-10-28 LAB — CULTURE, URINE COMPREHENSIVE

## 2022-10-30 ENCOUNTER — Ambulatory Visit: Payer: Commercial Managed Care - PPO | Admitting: Urology

## 2022-11-02 MED FILL — Sucralfate Tab 1 GM: ORAL | 30 days supply | Qty: 120 | Fill #1 | Status: AC

## 2022-11-08 ENCOUNTER — Other Ambulatory Visit: Payer: Self-pay

## 2022-11-08 DIAGNOSIS — R053 Chronic cough: Secondary | ICD-10-CM | POA: Diagnosis not present

## 2022-11-08 DIAGNOSIS — R0683 Snoring: Secondary | ICD-10-CM | POA: Diagnosis not present

## 2022-11-08 DIAGNOSIS — K219 Gastro-esophageal reflux disease without esophagitis: Secondary | ICD-10-CM | POA: Diagnosis not present

## 2022-11-08 MED ORDER — DEXLANSOPRAZOLE 60 MG PO CPDR
60.0000 mg | DELAYED_RELEASE_CAPSULE | Freq: Every day | ORAL | 3 refills | Status: DC
  Filled 2022-11-08: qty 90, 90d supply, fill #0

## 2022-11-12 ENCOUNTER — Other Ambulatory Visit: Payer: Self-pay

## 2022-12-12 ENCOUNTER — Other Ambulatory Visit: Payer: Self-pay

## 2022-12-12 ENCOUNTER — Other Ambulatory Visit: Payer: Self-pay | Admitting: Family Medicine

## 2022-12-12 MED ORDER — DICLOFENAC SODIUM 1 % EX GEL
2.0000 g | Freq: Four times a day (QID) | CUTANEOUS | 0 refills | Status: DC
  Filled 2022-12-12: qty 300, 37d supply, fill #0

## 2022-12-12 NOTE — Telephone Encounter (Signed)
Requested Prescriptions  Pending Prescriptions Disp Refills   diclofenac Sodium (VOLTAREN) 1 % GEL 300 g 0    Sig: APPLY 2 GRAMS TO THE AFFECTED AREA(S) 4 TIMES A DAY     Analgesics:  Topicals Failed - 12/12/2022 11:34 AM      Failed - Manual Review: Labs are only required if the patient has taken medication for more than 8 weeks.      Passed - PLT in normal range and within 360 days    Platelets  Date Value Ref Range Status  06/18/2022 291 150 - 450 x10E3/uL Final         Passed - HGB in normal range and within 360 days    Hemoglobin  Date Value Ref Range Status  06/18/2022 12.0 11.1 - 15.9 g/dL Final         Passed - HCT in normal range and within 360 days    Hematocrit  Date Value Ref Range Status  06/18/2022 38.2 34.0 - 46.6 % Final         Passed - Cr in normal range and within 360 days    Creatinine  Date Value Ref Range Status  04/15/2012 0.79 0.60 - 1.30 mg/dL Final   Creatinine, Ser  Date Value Ref Range Status  06/18/2022 0.80 0.57 - 1.00 mg/dL Final   Creatinine,U  Date Value Ref Range Status  06/01/2015 93.7 mg/dL Final         Passed - eGFR is 30 or above and within 360 days    EGFR (African American)  Date Value Ref Range Status  04/15/2012 >60  Final   GFR calc Af Amer  Date Value Ref Range Status  08/05/2020 104 >59 mL/min/1.73 Final    Comment:    **In accordance with recommendations from the NKF-ASN Task force,**   [facility] is in the process of updating its eGFR calculation to the   2021 CKD-EPI creatinine equation that estimates kidney function   without a race variable.    EGFR (Non-African Amer.)  Date Value Ref Range Status  04/15/2012 >60  Final    Comment:    eGFR values <17mL/min/1.73 m2 may be an indication of chronic kidney disease (CKD). Calculated eGFR is useful in patients with stable renal function. The eGFR calculation will not be reliable in acutely ill patients when serum creatinine is changing rapidly. It is not useful in   patients on dialysis. The eGFR calculation may not be applicable to patients at the low and high extremes of body sizes, pregnant women, and vegetarians.    GFR calc non Af Amer  Date Value Ref Range Status  08/05/2020 90 >59 mL/min/1.73 Final   GFR  Date Value Ref Range Status  06/01/2015 82.19 >60.00 mL/min Final   eGFR  Date Value Ref Range Status  06/18/2022 89 >59 mL/min/1.73 Final         Passed - Patient is not pregnant      Passed - Valid encounter within last 12 months    Recent Outpatient Visits           2 months ago Laryngitis   [place] [facility] [place], [place], DO   4 months ago COVID-19   [facility] Larae Grooms, NP   5 months ago Viral upper respiratory tract infection   [place] [facility] M[place], Oswaldo Conroy, PA-C   5 months ago Essential hypertension    [facility] [place], [place],  DO   1 year ago Routine general medical examination at a health care facility   [facility], Oralia Rud, DO       Future Appointments             In 1 week Laural Benes, Oralia Rud, DO [place] [facility]   In 3 months McG[place], Elana Alm [facility] [place]   In 4 months Deirdre Evener, MD [facility]

## 2022-12-18 ENCOUNTER — Encounter: Payer: 59 | Admitting: Family Medicine

## 2022-12-20 ENCOUNTER — Other Ambulatory Visit: Payer: Self-pay | Admitting: Oncology

## 2022-12-20 DIAGNOSIS — Z006 Encounter for examination for normal comparison and control in clinical research program: Secondary | ICD-10-CM

## 2022-12-24 ENCOUNTER — Ambulatory Visit (INDEPENDENT_AMBULATORY_CARE_PROVIDER_SITE_OTHER): Payer: 59 | Admitting: Family Medicine

## 2022-12-24 ENCOUNTER — Other Ambulatory Visit: Payer: Self-pay

## 2022-12-24 ENCOUNTER — Encounter: Payer: Self-pay | Admitting: Family Medicine

## 2022-12-24 VITALS — BP 135/75 | HR 67 | Temp 98.4°F | Ht 64.5 in | Wt 295.6 lb

## 2022-12-24 DIAGNOSIS — M25531 Pain in right wrist: Secondary | ICD-10-CM

## 2022-12-24 DIAGNOSIS — I10 Essential (primary) hypertension: Secondary | ICD-10-CM | POA: Diagnosis not present

## 2022-12-24 DIAGNOSIS — R5383 Other fatigue: Secondary | ICD-10-CM | POA: Diagnosis not present

## 2022-12-24 DIAGNOSIS — Z1231 Encounter for screening mammogram for malignant neoplasm of breast: Secondary | ICD-10-CM

## 2022-12-24 DIAGNOSIS — E782 Mixed hyperlipidemia: Secondary | ICD-10-CM

## 2022-12-24 DIAGNOSIS — Z0000 Encounter for general adult medical examination without abnormal findings: Secondary | ICD-10-CM

## 2022-12-24 DIAGNOSIS — K219 Gastro-esophageal reflux disease without esophagitis: Secondary | ICD-10-CM | POA: Diagnosis not present

## 2022-12-24 LAB — URINALYSIS, ROUTINE W REFLEX MICROSCOPIC
Bilirubin, UA: NEGATIVE
Glucose, UA: NEGATIVE
Ketones, UA: NEGATIVE
Leukocytes,UA: NEGATIVE
Nitrite, UA: NEGATIVE
Protein,UA: NEGATIVE
RBC, UA: NEGATIVE
Specific Gravity, UA: 1.01 (ref 1.005–1.030)
Urobilinogen, Ur: 0.2 mg/dL (ref 0.2–1.0)
pH, UA: 7 (ref 5.0–7.5)

## 2022-12-24 LAB — MICROALBUMIN, URINE WAIVED
Creatinine, Urine Waived: 10 mg/dL (ref 10–300)
Microalb, Ur Waived: 10 mg/L (ref 0–19)
Microalb/Creat Ratio: <30 mg/g (ref <30)

## 2022-12-24 MED ORDER — ACYCLOVIR 400 MG PO TABS
400.0000 mg | ORAL_TABLET | Freq: Three times a day (TID) | ORAL | 1 refills | Status: DC
  Filled 2022-12-24: qty 270, 90d supply, fill #0

## 2022-12-24 MED ORDER — SIMETHICONE 180 MG PO CAPS
1.0000 | ORAL_CAPSULE | Freq: Three times a day (TID) | ORAL | 6 refills | Status: DC
  Filled 2022-12-24: qty 90, 30d supply, fill #0

## 2022-12-24 MED ORDER — BUSPIRONE HCL 10 MG PO TABS
10.0000 mg | ORAL_TABLET | Freq: Three times a day (TID) | ORAL | 6 refills | Status: DC
  Filled 2022-12-24: qty 90, 30d supply, fill #0

## 2022-12-24 MED ORDER — PANTOPRAZOLE SODIUM 40 MG PO TBEC
80.0000 mg | DELAYED_RELEASE_TABLET | Freq: Every day | ORAL | 1 refills | Status: DC
  Filled 2022-12-24 – 2023-01-19 (×2): qty 180, 90d supply, fill #0
  Filled 2023-04-28: qty 180, 90d supply, fill #1

## 2022-12-24 MED ORDER — HYDROCHLOROTHIAZIDE 25 MG PO TABS
25.0000 mg | ORAL_TABLET | Freq: Every day | ORAL | 1 refills | Status: DC
  Filled 2022-12-24 – 2023-01-19 (×2): qty 90, 90d supply, fill #0
  Filled 2023-04-06: qty 90, 90d supply, fill #1

## 2022-12-24 MED ORDER — CYCLOBENZAPRINE HCL 10 MG PO TABS
10.0000 mg | ORAL_TABLET | Freq: Every day | ORAL | 2 refills | Status: DC
  Filled 2022-12-24: qty 30, 30d supply, fill #0

## 2022-12-24 MED ORDER — EZETIMIBE 10 MG PO TABS
10.0000 mg | ORAL_TABLET | Freq: Every day | ORAL | 1 refills | Status: DC
  Filled 2022-12-24: qty 90, 90d supply, fill #0
  Filled 2023-04-06: qty 90, 90d supply, fill #1

## 2022-12-24 MED ORDER — SUCRALFATE 1 G PO TABS
1.0000 g | ORAL_TABLET | Freq: Three times a day (TID) | ORAL | 1 refills | Status: DC
  Filled 2022-12-24 – 2023-01-19 (×2): qty 120, 30d supply, fill #0
  Filled 2023-06-09 – 2023-10-06 (×2): qty 120, 30d supply, fill #1

## 2022-12-24 MED ORDER — PRAVASTATIN SODIUM 20 MG PO TABS
20.0000 mg | ORAL_TABLET | ORAL | 0 refills | Status: DC
  Filled 2022-12-24 – 2023-03-09 (×2): qty 36, 84d supply, fill #0
  Filled 2023-05-30: qty 36, 84d supply, fill #1
  Filled 2023-09-01: qty 36, 84d supply, fill #2
  Filled 2023-11-24: qty 36, 84d supply, fill #3

## 2022-12-24 NOTE — Progress Notes (Signed)
BP 135/75   Pulse 67   Temp 98.4 F (36.9 C) (Oral)   Ht 5' 4.5" (1.638 m)   Wt 295 lb 9.6 oz (134.1 kg)   SpO2 98%   BMI 49.96 kg/m    Subjective:    Patient ID: Latoya Lutz, female    DOB: [birth year], 52 y.o.   MRN: [id number]  HPI: Latoya Lutz is a 52 y.o. female presenting on 12/24/2022 for comprehensive medical examination. Current medical complaints include:  HYPERTENSION / HYPERLIPIDEMIA Satisfied with current treatment? yes Duration of hypertension: chronic BP monitoring frequency: not checking BP medication side effects: no Past BP meds: hctz Duration of hyperlipidemia: chronic Cholesterol medication side effects: no Cholesterol supplements: none Past cholesterol medications: pravastatin, zetia Medication compliance: excellent compliance Aspirin: no Recent stressors: no Recurrent headaches: no Visual changes: no Palpitations: no Dyspnea: no Chest pain: no Lower extremity edema: no Dizzy/lightheaded: no  She currently lives with: husband Menopausal Symptoms: no  Depression Screen done today and results listed below:     12/24/2022    2:43 PM 10/04/2022    3:23 PM 06/18/2022    2:34 PM 12/04/2021    3:36 PM 11/01/2020    3:53 PM  Depression screen PHQ 2/9  Decreased Interest 0 0 0 0 0  Down, Depressed, Hopeless 0 0 0 0 0  PHQ - 2 Score 0 0 0 0 0  Altered sleeping 0 0 0 0   Tired, decreased energy 1 0 0 0   Change in appetite 0 0 0 0   Feeling bad or failure about yourself  0 0 0 0   Trouble concentrating 0 0 0 0   Moving slowly or fidgety/restless 0 0 0 0   Suicidal thoughts 0 0 0 0   PHQ-9 Score 1 0 0 0   Difficult doing work/chores Not difficult at all Not difficult at all Not difficult at all Not difficult at all      Past Medical History:  Past Medical History:  Diagnosis Date   Acid reflux    Actinic keratosis    Allergy    Arthritis    Atypical mole 10/27/2019   Left labia mucosa    Atypical nevus 05/25/2020   R labia  majora - atypical proliferation   Candidal dermatitis 05/25/2014   Cervical spine disease    Chicken pox    Chronic urethral narrowing    undergoing stretching   Complication of anesthesia    Dysplastic nevus 04/05/2021   R post med thigh near popliteal, mod to severe atypia   Endometriosis    [facility]. Gerrie Nordmann at [facility], removed    Esophageal reflux 05/25/2014   Gross hematuria 04/25/2015   H/O cystoscopy    normal   Heart murmur    Hx of basal cell carcinoma 12/29/2008   L low back   Hypertension    Infection of urinary tract 09/11/2014   Kidney stone 09/11/2014   Kidney stones    Microscopic hematuria 09/11/2014   Obesity, morbid, BMI 40.0-49.9 (HCC) 04/10/2012   PONV (postoperative nausea and vomiting)    Right ureteral stone 04/28/2015   Skin cancer    Urinary retention     Surgical History:  Past Surgical History:  Procedure Laterality Date   ABDOMINAL HYSTERECTOMY     BLADDER SURGERY  07/2003   CARPAL TUNNEL RELEASE Right    CESAREAN SECTION  1998   CHOLECYSTECTOMY     COLONOSCOPY WITH PROPOFOL N/A 08/24/2021   Procedure:  COLONOSCOPY WITH PROPOFOL;  Surgeon: Midge Minium, MD;  Location: [facility] ENDOSCOPY;  Service: Endoscopy;  Laterality: N/A;  9 AM ARRIVAL, PLEASE   CYSTOSCOPY W/ RETROGRADES Right 05/16/2015   Procedure: CYSTOSCOPY WITH RETROGRADE PYELOGRAM;  Surgeon: Hildred Laser, MD;  Location: [facility] ORS;  Service: Urology;  Laterality: Right;   CYSTOSCOPY W/ URETERAL STENT PLACEMENT Right 05/16/2015   Procedure: CYSTOSCOPY WITH STENT REPLACEMENT;  Surgeon: Hildred Laser, MD;  Location: [facility] ORS;  Service: Urology;  Laterality: Right;   CYSTOSCOPY WITH STENT PLACEMENT Right 04/29/2015   Procedure: CYSTOSCOPY WITH STENT PLACEMENT;  Surgeon: Hildred Laser, MD;  Location: [facility] ORS;  Service: Urology;  Laterality: Right;   ESOPHAGOGASTRODUODENOSCOPY N/A 08/24/2021   Procedure: ESOPHAGOGASTRODUODENOSCOPY (EGD);  Surgeon: Midge Minium, MD;  Location: [facility]  ENDOSCOPY;  Service: Endoscopy;  Laterality: N/A;   EXTRACORPOREAL SHOCK WAVE LITHOTRIPSY Left 06/02/2015   Procedure: EXTRACORPOREAL SHOCK WAVE LITHOTRIPSY (ESWL);  Surgeon: Vanna Scotland, MD;  Location: [facility] ORS;  Service: Urology;  Laterality: Left;   FOOT SURGERY  2003   KNEE SURGERY  2005   TONSILLECTOMY     TONSILLECTOMY     lingual growth on vocal cord removal   TOTAL ABDOMINAL HYSTERECTOMY W/ BILATERAL SALPINGOOPHORECTOMY     [facility] complete   URETEROSCOPY WITH HOLMIUM LASER LITHOTRIPSY Right 05/16/2015   Procedure: URETEROSCOPY WITH HOLMIUM LASER LITHOTRIPSY;  Surgeon: Hildred Laser, MD;  Location: [facility] ORS;  Service: Urology;  Laterality: Right;    Medications:  Current Outpatient Medications on File Prior to Visit  Medication Sig   azelastine (ASTELIN) 0.1 % nasal spray Place 2 sprays into both nostrils 2 (two) times daily. Use in each nostril as directed   Calcium Carbonate (CALCIUM 600 PO) Take 1 tablet by mouth daily.    diclofenac Sodium (VOLTAREN) 1 % GEL Apply 2 g topically 4 (four) times daily.   fexofenadine (ALLEGRA) 180 MG tablet Take 180 mg by mouth daily.   Magnesium 100 MG CAPS Take 30 mg by mouth 2 (two) times daily.   Nutritional Supplements (MENOPAUSE FORMULA) TABS Take by mouth. Reported on 11/04/2015   pseudoephedrine (SUDAFED) 30 MG tablet Take 30 mg by mouth 3 (three) times daily.   UNIFINE PENTIPS 31G X 8 MM MISC    vitamin E 400 UNIT capsule Take 400 Units by mouth daily.   fluticasone (FLONASE) 50 MCG/ACT nasal spray PLACE 2 SPRAYS INTO BOTH NOSTRILS DAILY.   No current facility-administered medications on file prior to visit.    Allergies:  Allergies  Allergen Reactions   Atorvastatin Other (See Comments)    Leg cramps    Social History:  Social History   Socioeconomic History   Marital status: Married    Spouse name: Not on file   Number of children: Not on file   Years of education: Not on file   Highest education level: Not on file   Occupational History   Not on file  Tobacco Use   Smoking status: Former    Current packs/day: 0.00    Average packs/day: 0.5 packs/day for 17.0 years (8.5 ttl pk-yrs)    Types: Cigarettes    Start date: 04/28/1991    Quit date: 04/27/2008    Years since quitting: 14.6   Smokeless tobacco: Never   Tobacco comments:    quit 7 years   Vaping Use   Vaping status: Never Used  Substance and Sexual Activity   Alcohol use: No   Drug use: No   Sexual activity: Yes  Other Topics Concern   Not on file  Social History Narrative   Lives in [place] with son 15YO and fiance      Work - [employer]   Social Determinants of Corporate investment banker Strain: Not on [employer] Insecurity: Not on file  Transportation Needs: Not on file  Physical Activity: Not on file  Stress: Not on file  Social Connections: Not on file  Intimate Partner Violence: Not on file   Social History   Tobacco Use  Smoking Status Former   Current packs/day: 0.00   Average packs/day: 0.5 packs/day for 17.0 years (8.5 ttl pk-yrs)   Types: Cigarettes   Start date: 04/28/1991   Quit date: 04/27/2008   Years since quitting: 14.6  Smokeless Tobacco Never  Tobacco Comments   quit 7 years    Social History   Substance and Sexual Activity  Alcohol Use No    Family History:  Family History  Problem Relation Age of Onset   Cancer Mother        pancreatic cancer   Heart disease Mother        Pacemaker and defib   Hypertension Mother    Hyperlipidemia Mother    Early death Mother    Stroke Mother    Varicose Veins Mother    Cancer Father    COPD Father        Lung and brain cancer   Early death Father    Cancer Maternal Grandmother        Colon   Diabetes Paternal Grandmother    Cirrhosis Paternal Grandmother    Cancer Paternal Grandmother    Kidney disease Neg Hx    Bladder Cancer Neg Hx    Breast cancer Neg Hx     Past medical history, surgical history, medications, allergies, family history  and social history reviewed with patient today and changes made to appropriate areas of the chart.   Review of Systems  Constitutional: Negative.   HENT: Negative.    Eyes: Negative.   Cardiovascular:  Positive for leg swelling. Negative for chest pain, palpitations, orthopnea, claudication and PND.  Gastrointestinal:  Positive for constipation, diarrhea and heartburn. Negative for abdominal pain, blood in stool, melena, nausea and vomiting.  Genitourinary: Negative.   Musculoskeletal:  Positive for myalgias. Negative for back pain, falls, joint pain and neck pain.  Skin: Negative.   Neurological: Negative.   Endo/Heme/Allergies: Negative.   Psychiatric/Behavioral: Negative.     All other ROS negative except what is listed above and in the HPI.      Objective:    BP 135/75   Pulse 67   Temp 98.4 F (36.9 C) (Oral)   Ht 5' 4.5" (1.638 m)   Wt 295 lb 9.6 oz (134.1 kg)   SpO2 98%   BMI 49.96 kg/m   Wt Readings from Last 3 Encounters:  12/24/22 295 lb 9.6 oz (134.1 kg)  10/23/22 285 lb (129.3 kg)  10/04/22 295 lb 12.8 oz (134.2 kg)    Physical Exam Vitals and nursing note reviewed.  Constitutional:      General: She is not in acute distress.    Appearance: Normal appearance. She is obese. She is not ill-appearing, toxic-appearing or diaphoretic.  HENT:     Head: Normocephalic and atraumatic.     Right Ear: Tympanic membrane, ear canal and external ear normal. There is no impacted cerumen.     Left Ear: Tympanic membrane, ear canal and external ear normal. There  is no impacted cerumen.     Nose: Nose normal. No congestion or rhinorrhea.     Mouth/Throat:     Mouth: Mucous membranes are moist.     Pharynx: Oropharynx is clear. No oropharyngeal exudate or posterior oropharyngeal erythema.  Eyes:     General: No scleral icterus.       Right eye: No discharge.        Left eye: No discharge.     Extraocular Movements: Extraocular movements intact.     Conjunctiva/sclera:  Conjunctivae normal.     Pupils: Pupils are equal, round, and reactive to light.  Neck:     Vascular: No carotid bruit.  Cardiovascular:     Rate and Rhythm: Normal rate and regular rhythm.     Pulses: Normal pulses.     Heart sounds: No murmur heard.    No friction rub. No gallop.  Pulmonary:     Effort: Pulmonary effort is normal. No respiratory distress.     Breath sounds: Normal breath sounds. No stridor. No wheezing, rhonchi or rales.  Chest:     Chest wall: No tenderness.  Abdominal:     General: Abdomen is flat. Bowel sounds are normal. There is no distension.     Palpations: Abdomen is soft. There is no mass.     Tenderness: There is no abdominal tenderness. There is no right CVA tenderness, left CVA tenderness, guarding or rebound.     Hernia: No hernia is present.  Genitourinary:    Comments: Breast and pelvic exams deferred with shared decision making Musculoskeletal:        General: No swelling, tenderness, deformity or signs of injury.     Cervical back: Normal range of motion and neck supple. No rigidity. No muscular tenderness.     Right lower leg: No edema.     Left lower leg: No edema.  Lymphadenopathy:     Cervical: No cervical adenopathy.  Skin:    General: Skin is warm and dry.     Capillary Refill: Capillary refill takes less than 2 seconds.     Coloration: Skin is not jaundiced or pale.     Findings: No bruising, erythema, lesion or rash.  Neurological:     General: No focal deficit present.     Mental Status: She is alert and oriented to person, place, and time. Mental status is at baseline.     Cranial Nerves: No cranial nerve deficit.     Sensory: No sensory deficit.     Motor: No weakness.     Coordination: Coordination normal.     Gait: Gait normal.     Deep Tendon Reflexes: Reflexes normal.  Psychiatric:        Mood and Affect: Mood normal.        Behavior: Behavior normal.        Thought Content: Thought content normal.        Judgment:  Judgment normal.     Results for orders placed or performed in visit on 12/24/22  Urinalysis, Routine w reflex microscopic  Result Value Ref Range   Specific Gravity, UA 1.010 1.005 - 1.030   pH, UA 7.0 5.0 - 7.5   Color, UA Yellow Yellow   Appearance Ur Clear Clear   Leukocytes,UA Negative Negative   Protein,UA Negative Negative/Trace   Glucose, UA Negative Negative   Ketones, UA Negative Negative   RBC, UA Negative Negative   Bilirubin, UA Negative Negative   Urobilinogen, Ur 0.2 0.2 -  1.0 mg/dL   Nitrite, UA Negative Negative   Microscopic Examination Comment   Microalbumin, Urine Waived  Result Value Ref Range   Microalb, Ur Waived 10 0 - 19 mg/L   Creatinine, Urine Waived 10 10 - 300 mg/dL   Microalb/Creat Ratio <30 <30 mg/g      Assessment & Plan:   Problem List Items Addressed This Visit       Cardiovascular and Mediastinum   Essential hypertension    Under good control on current regimen. Continue current regimen. Continue to monitor. Call with any concerns. Refills given. Labs drawn today.        Relevant Medications   pravastatin (PRAVACHOL) 20 MG tablet   ezetimibe (ZETIA) 10 MG tablet   hydrochlorothiazide (HYDRODIURIL) 25 MG tablet     Other   Hyperlipidemia    Under good control on current regimen. Continue current regimen. Continue to monitor. Call with any concerns. Refills given. Labs drawn today.       Relevant Medications   pravastatin (PRAVACHOL) 20 MG tablet   ezetimibe (ZETIA) 10 MG tablet   hydrochlorothiazide (HYDRODIURIL) 25 MG tablet   Other Visit Diagnoses     Routine general medical examination at a health care facility    -  Primary   Vaccines up to date. Screening labs checked today. Pap N/A. Mammo, colonoscopy up to date. Continue diet and exercise. call wtih any concerns.   Relevant Orders   CBC with Differential/Platelet   Comprehensive metabolic panel   Lipid Panel w/o Chol/HDL Ratio   Urinalysis, Routine w reflex  microscopic (Completed)   TSH   Microalbumin, Urine Waived (Completed)   Fatigue, unspecified type       Labs drawn today. Call with any concerns.   Relevant Orders   VITAMIN D 25 Hydroxy (Vit-D Deficiency, Fractures)   Ferritin   B12   Gastroesophageal reflux disease       Relevant Medications   pantoprazole (PROTONIX) 40 MG tablet   Simethicone 180 MG CAPS   sucralfate (CARAFATE) 1 g tablet   Right wrist pain       Referral to ortho placed today. Call with any concerns.   Relevant Orders   Ambulatory referral to Orthopedic Surgery   Encounter for screening mammogram for malignant neoplasm of breast       Mammo ordered today.   Relevant Orders   MM 3D SCREENING MAMMOGRAM BILATERAL BREAST        Follow up plan: Return in about 6 months (around 06/26/2023).   LABORATORY TESTING:  - Pap smear: not applicable  IMMUNIZATIONS:   - Tdap: Tetanus vaccination status reviewed: last tetanus booster within 10 years. - Influenza: Postponed to flu season - Pneumovax: Not applicable - Prevnar: Not applicable - COVID: Up to date - HPV: Not applicable - Shingrix vaccine: Up to date  SCREENING: -Mammogram: Up to date  - Colonoscopy: Up to date   PATIENT COUNSELING:   Advised to take 1 mg of folate supplement per day if capable of pregnancy.   Sexuality: Discussed sexually transmitted diseases, partner selection, use of condoms, avoidance of unintended pregnancy  and contraceptive alternatives.   Advised to avoid cigarette smoking.  I discussed with the patient that most people either abstain from alcohol or drink within safe limits (<=14/week and <=4 drinks/occasion for males, <=7/weeks and <= 3 drinks/occasion for females) and that the risk for alcohol disorders and other health effects rises proportionally with the number of drinks per week and how often  a drinker exceeds daily limits.  Discussed cessation/primary prevention of drug use and availability of treatment for abuse.    Diet: Encouraged to adjust caloric intake to maintain  or achieve ideal body weight, to reduce intake of dietary saturated fat and total fat, to limit sodium intake by avoiding high sodium foods and not adding table salt, and to maintain adequate dietary potassium and calcium preferably from fresh fruits, vegetables, and low-fat dairy products.    stressed the importance of regular exercise  Injury prevention: Discussed safety belts, safety helmets, smoke detector, smoking near bedding or upholstery.   Dental health: Discussed importance of regular tooth brushing, flossing, and dental visits.    NEXT PREVENTATIVE PHYSICAL DUE IN 1 YEAR. Return in about 6 months (around 06/26/2023).

## 2022-12-24 NOTE — Assessment & Plan Note (Signed)
 Under good control on current regimen. Continue current regimen. Continue to monitor. Call with any concerns. Refills given. Labs drawn today.   

## 2022-12-25 LAB — CBC WITH DIFFERENTIAL/PLATELET
Basophils Absolute: 0 10*3/uL (ref 0.0–0.2)
Basos: 0 %
EOS (ABSOLUTE): 0.1 10*3/uL (ref 0.0–0.4)
Eos: 1 %
Hematocrit: 39 % (ref 34.0–46.6)
Hemoglobin: 12.5 g/dL (ref 11.1–15.9)
Immature Grans (Abs): 0 10*3/uL (ref 0.0–0.1)
Immature Granulocytes: 0 %
Lymphocytes Absolute: 2.8 10*3/uL (ref 0.7–3.1)
Lymphs: 33 %
MCH: 26.8 pg (ref 26.6–33.0)
MCHC: 32.1 g/dL (ref 31.5–35.7)
MCV: 84 fL (ref 79–97)
Monocytes Absolute: 0.6 10*3/uL (ref 0.1–0.9)
Monocytes: 7 %
Neutrophils Absolute: 4.9 10*3/uL (ref 1.4–7.0)
Neutrophils: 59 %
Platelets: 261 10*3/uL (ref 150–450)
RBC: 4.66 x10E6/uL (ref 3.77–5.28)
RDW: 14.3 % (ref 11.7–15.4)
WBC: 8.4 10*3/uL (ref 3.4–10.8)

## 2022-12-25 LAB — COMPREHENSIVE METABOLIC PANEL
ALT: 14 IU/L (ref 0–32)
AST: 15 IU/L (ref 0–40)
Albumin: 4.4 g/dL (ref 3.8–4.9)
Alkaline Phosphatase: 114 IU/L (ref 44–121)
BUN/Creatinine Ratio: 16 (ref 9–23)
BUN: 12 mg/dL (ref 6–24)
Bilirubin Total: 0.2 mg/dL (ref 0.0–1.2)
CO2: 26 mmol/L (ref 20–29)
Calcium: 9.6 mg/dL (ref 8.7–10.2)
Chloride: 99 mmol/L (ref 96–106)
Creatinine, Ser: 0.73 mg/dL (ref 0.57–1.00)
Globulin, Total: 2.2 g/dL (ref 1.5–4.5)
Glucose: 95 mg/dL (ref 70–99)
Potassium: 3.6 mmol/L (ref 3.5–5.2)
Sodium: 141 mmol/L (ref 134–144)
Total Protein: 6.6 g/dL (ref 6.0–8.5)
eGFR: 98 mL/min/{1.73_m2} (ref >59)

## 2022-12-25 LAB — FERRITIN

## 2022-12-25 LAB — LIPID PANEL W/O CHOL/HDL RATIO
Cholesterol, Total: 178 mg/dL (ref 100–199)
HDL: 56 mg/dL (ref >39)
LDL Chol Calc (NIH): 97 mg/dL (ref 0–99)
Triglycerides: 146 mg/dL (ref 0–149)
VLDL Cholesterol Cal: 25 mg/dL (ref 5–40)

## 2022-12-25 LAB — TSH: TSH: 2.8 u[IU]/mL (ref 0.450–4.500)

## 2022-12-26 LAB — VITAMIN B12: Vitamin B-12: 355 pg/mL (ref 232–1245)

## 2022-12-26 LAB — VITAMIN D 25 HYDROXY (VIT D DEFICIENCY, FRACTURES): Vit D, 25-Hydroxy: 25.9 ng/mL — ABNORMAL LOW (ref 30.0–100.0)

## 2023-01-01 ENCOUNTER — Other Ambulatory Visit: Payer: Self-pay

## 2023-01-03 ENCOUNTER — Other Ambulatory Visit
Admission: RE | Admit: 2023-01-03 | Discharge: 2023-01-03 | Disposition: A | Discharge status: Home / Self Care | Payer: 59 | Attending: Oncology | Admitting: Oncology

## 2023-01-03 DIAGNOSIS — Z006 Encounter for examination for normal comparison and control in clinical research program: Secondary | ICD-10-CM | POA: Insufficient documentation

## 2023-01-04 ENCOUNTER — Other Ambulatory Visit: Payer: Self-pay

## 2023-01-07 ENCOUNTER — Encounter: Payer: Self-pay | Admitting: Family Medicine

## 2023-01-07 ENCOUNTER — Telehealth: Payer: Self-pay | Admitting: Family Medicine

## 2023-01-07 ENCOUNTER — Ambulatory Visit: Payer: 59 | Admitting: Family Medicine

## 2023-01-07 ENCOUNTER — Other Ambulatory Visit: Payer: Self-pay

## 2023-01-07 VITALS — BP 119/73 | HR 73 | Temp 97.7°F | Wt 293.6 lb

## 2023-01-07 DIAGNOSIS — L03113 Cellulitis of right upper limb: Secondary | ICD-10-CM

## 2023-01-07 MED ORDER — AMOXICILLIN-POT CLAVULANATE 875-125 MG PO TABS
1.0000 | ORAL_TABLET | Freq: Two times a day (BID) | ORAL | 0 refills | Status: DC
  Filled 2023-01-07: qty 20, 10d supply, fill #0

## 2023-01-07 NOTE — Telephone Encounter (Signed)
That does not sound like something that needs an x-ray, it sounds like something that needs an appt- can we get her in?

## 2023-01-07 NOTE — Addendum Note (Signed)
Addended by: Dorcas Carrow on: 01/07/2023 04:38 PM   Modules accepted: Orders

## 2023-01-07 NOTE — Progress Notes (Signed)
BP 119/73   Pulse 73   Temp 97.7 F (36.5 C) (Oral)   Wt 293 lb 9.6 oz (133.2 kg)   SpO2 100%   BMI 49.62 kg/m    Subjective:    Patient ID: Latoya Lutz, female    DOB: Nov 21, 1970, 52 y.o.   MRN: [id number]  HPI: Latoya Lutz is a 52 y.o. female  Chief Complaint  Patient presents with   Arm Pain   SKIN INFECTION Duration: today Location: R elbow and arm History of trauma in area: scrape getting out of the pool Pain: yes Quality: burning, aching Severity: severe Redness: yes Swelling: yes Oozing: no Pus: no Fevers: no Nausea/vomiting: no Status: better Treatments attempted:none  Tetanus: UTD   Relevant past medical, surgical, family and social history reviewed and updated as indicated. Interim medical history since our last visit reviewed. Allergies and medications reviewed and updated.  Review of Systems  Constitutional: Negative.   Respiratory: Negative.    Cardiovascular: Negative.   Gastrointestinal: Negative.   Musculoskeletal: Negative.   Skin:  Positive for color change and wound. Negative for pallor and rash.  Psychiatric/Behavioral: Negative.      Per HPI unless specifically indicated above     Objective:    BP 119/73   Pulse 73   Temp 97.7 F (36.5 C) (Oral)   Wt 293 lb 9.6 oz (133.2 kg)   SpO2 100%   BMI 49.62 kg/m   Wt Readings from Last 3 Encounters:  01/07/23 293 lb 9.6 oz (133.2 kg)  12/24/22 295 lb 9.6 oz (134.1 kg)  10/23/22 285 lb (129.3 kg)    Physical Exam Vitals and nursing note reviewed.  Constitutional:      General: She is not in acute distress.    Appearance: Normal appearance. She is not ill-appearing, toxic-appearing or diaphoretic.  HENT:     Head: Normocephalic and atraumatic.     Right Ear: External ear normal.     Left Ear: External ear normal.     Nose: Nose normal.     Mouth/Throat:     Mouth: Mucous membranes are moist.     Pharynx: Oropharynx is clear.  Eyes:     General: No scleral  icterus.       Right eye: No discharge.        Left eye: No discharge.     Extraocular Movements: Extraocular movements intact.     Conjunctiva/sclera: Conjunctivae normal.     Pupils: Pupils are equal, round, and reactive to light.  Cardiovascular:     Rate and Rhythm: Normal rate and regular rhythm.     Pulses: Normal pulses.     Heart sounds: Murmur heard.     No friction rub. No gallop.  Pulmonary:     Effort: Pulmonary effort is normal. No respiratory distress.     Breath sounds: Normal breath sounds. No stridor. No wheezing, rhonchi or rales.  Chest:     Chest wall: No tenderness.  Musculoskeletal:        General: Normal range of motion.     Cervical back: Normal range of motion and neck supple.  Skin:    General: Skin is warm and dry.     Capillary Refill: Capillary refill takes less than 2 seconds.     Coloration: Skin is not jaundiced or pale.     Findings: Erythema (scrape on R elbow, swelling, hot and tender to palpation) present. No bruising, lesion or rash.  Neurological:  General: No focal deficit present.     Mental Status: She is alert and oriented to person, place, and time. Mental status is at baseline.  Psychiatric:        Mood and Affect: Mood normal.        Behavior: Behavior normal.        Thought Content: Thought content normal.        Judgment: Judgment normal.     Results for orders placed or performed in visit on 12/24/22  CBC with Differential/Platelet  Result Value Ref Range   WBC 8.4 3.4 - 10.8 x10E3/uL   RBC 4.66 3.77 - 5.28 x10E6/uL   Hemoglobin 12.5 11.1 - 15.9 g/dL   Hematocrit 60.4 54.0 - 46.6 %   MCV 84 79 - 97 fL   MCH 26.8 26.6 - 33.0 pg   MCHC 32.1 31.5 - 35.7 g/dL   RDW 98.1 19.1 - 47.8 %   Platelets 261 150 - 450 x10E3/uL   Neutrophils 59 Not Estab. %   Lymphs 33 Not Estab. %   Monocytes 7 Not Estab. %   Eos 1 Not Estab. %   Basos 0 Not Estab. %   Neutrophils Absolute 4.9 1.4 - 7.0 x10E3/uL   Lymphocytes Absolute 2.8 0.7  - 3.1 x10E3/uL   Monocytes Absolute 0.6 0.1 - 0.9 x10E3/uL   EOS (ABSOLUTE) 0.1 0.0 - 0.4 x10E3/uL   Basophils Absolute 0.0 0.0 - 0.2 x10E3/uL   Immature Granulocytes 0 Not Estab. %   Immature Grans (Abs) 0.0 0.0 - 0.1 x10E3/uL  Comprehensive metabolic panel  Result Value Ref Range   Glucose 95 70 - 99 mg/dL   BUN 12 6 - 24 mg/dL   Creatinine, Ser 2.95 0.57 - 1.00 mg/dL   eGFR 98 >62 ZH/YQM/5.78   BUN/Creatinine Ratio 16 9 - 23   Sodium 141 134 - 144 mmol/L   Potassium 3.6 3.5 - 5.2 mmol/L   Chloride 99 96 - 106 mmol/L   CO2 26 20 - 29 mmol/L   Calcium 9.6 8.7 - 10.2 mg/dL   Total Protein 6.6 6.0 - 8.5 g/dL   Albumin 4.4 3.8 - 4.9 g/dL   Globulin, Total 2.2 1.5 - 4.5 g/dL   Bilirubin Total 0.2 0.0 - 1.2 mg/dL   Alkaline Phosphatase 114 44 - 121 IU/L   AST 15 0 - 40 IU/L   ALT 14 0 - 32 IU/L  Lipid Panel w/o Chol/HDL Ratio  Result Value Ref Range   Cholesterol, Total 178 100 - 199 mg/dL   Triglycerides 469 0 - 149 mg/dL   HDL 56 >62 mg/dL   VLDL Cholesterol Cal 25 5 - 40 mg/dL   LDL Chol Calc (NIH) 97 0 - 99 mg/dL  Urinalysis, Routine w reflex microscopic  Result Value Ref Range   Specific Gravity, UA 1.010 1.005 - 1.030   pH, UA 7.0 5.0 - 7.5   Color, UA Yellow Yellow   Appearance Ur Clear Clear   Leukocytes,UA Negative Negative   Protein,UA Negative Negative/Trace   Glucose, UA Negative Negative   Ketones, UA Negative Negative   RBC, UA Negative Negative   Bilirubin, UA Negative Negative   Urobilinogen, Ur 0.2 0.2 - 1.0 mg/dL   Nitrite, UA Negative Negative   Microscopic Examination Comment   TSH  Result Value Ref Range   TSH 2.800 0.450 - 4.500 uIU/mL  Microalbumin, Urine Waived  Result Value Ref Range   Microalb, Ur Waived 10 0 - 19 mg/L  Creatinine, Urine Waived 10 10 - 300 mg/dL   Microalb/Creat Ratio <30 <30 mg/g  VITAMIN D 25 Hydroxy (Vit-D Deficiency, Fractures)  Result Value Ref Range   Vit D, 25-Hydroxy 25.9 (L) 30.0 - 100.0 ng/mL  Ferritin   Result Value Ref Range   Ferritin 87 15 - 150 ng/mL  B12  Result Value Ref Range   Vitamin B-12 355 232 - 1,245 pg/mL      Assessment & Plan:   Problem List Items Addressed This Visit   None Visit Diagnoses     Cellulitis of right upper extremity    -  Primary   WIll treat with augmentin. Call if not better in the next couple of days. Continue to monitor.        Follow up plan: Return if symptoms worsen or fail to improve.

## 2023-01-07 NOTE — Telephone Encounter (Signed)
Called to schedule patient, she is saying that she needs to be seen ASAP.  States that it wakes her up in her sleep and wasn't sure if you could work her in or where?

## 2023-01-07 NOTE — Telephone Encounter (Signed)
Pt is calling in because she says her right arm is swollen and producing heat and would like Dr. Laural Lutz to refer her for an Xray. Pt says she has an appointment with [facility] on 01/17/23 but pt says she cannot wait until the 8th. Please follow up with pt.

## 2023-01-17 DIAGNOSIS — L03113 Cellulitis of right upper limb: Secondary | ICD-10-CM | POA: Diagnosis not present

## 2023-01-19 ENCOUNTER — Other Ambulatory Visit: Payer: Self-pay | Admitting: Family Medicine

## 2023-01-20 ENCOUNTER — Other Ambulatory Visit: Payer: Self-pay

## 2023-01-20 ENCOUNTER — Other Ambulatory Visit: Payer: Self-pay | Admitting: Family Medicine

## 2023-01-21 ENCOUNTER — Other Ambulatory Visit: Payer: Self-pay

## 2023-01-22 ENCOUNTER — Other Ambulatory Visit: Payer: Self-pay

## 2023-01-22 NOTE — Telephone Encounter (Signed)
Requested medications are due for refill today.  Unsure  Requested medications are on the active medications list.  no  Last refill. 05/19/2021   Future visit scheduled.   yes  Notes to clinic.  Please review for refill.    Requested Prescriptions  Pending Prescriptions Disp Refills   meloxicam (MOBIC) 15 MG tablet 90 tablet 3    Sig: TAKE 1 TABLET BY MOUTH DAILY.     Analgesics:  COX2 Inhibitors Failed - 01/20/2023  9:45 PM      Failed - Manual Review: Labs are only required if the patient has taken medication for more than 8 weeks.      Passed - HGB in normal range and within 360 days    Hemoglobin  Date Value Ref Range Status  12/24/2022 12.5 11.1 - 15.9 g/dL Final         Passed - Cr in normal range and within 360 days    Creatinine  Date Value Ref Range Status  04/15/2012 0.79 0.60 - 1.30 mg/dL Final   Creatinine, Ser  Date Value Ref Range Status  12/24/2022 0.73 0.57 - 1.00 mg/dL Final   Creatinine,U  Date Value Ref Range Status  06/01/2015 93.7 mg/dL Final         Passed - HCT in normal range and within 360 days    Hematocrit  Date Value Ref Range Status  12/24/2022 39.0 34.0 - 46.6 % Final         Passed - AST in normal range and within 360 days    AST  Date Value Ref Range Status  12/24/2022 15 0 - 40 IU/L Final   SGOT(AST)  Date Value Ref Range Status  04/15/2012 24 15 - 37 Unit/L Final         Passed - ALT in normal range and within 360 days    ALT  Date Value Ref Range Status  12/24/2022 14 0 - 32 IU/L Final   SGPT (ALT)  Date Value Ref Range Status  04/15/2012 33 12 - 78 U/L Final         Passed - eGFR is 30 or above and within 360 days    EGFR (African American)  Date Value Ref Range Status  04/15/2012 >60  Final   GFR calc Af Amer  Date Value Ref Range Status  08/05/2020 104 >59 mL/min/1.73 Final    Comment:    **In accordance with recommendations from the NKF-ASN Task force,**   [facility] is in the process of updating its eGFR  calculation to the   2021 CKD-EPI creatinine equation that estimates kidney function   without a race variable.    EGFR (Non-African Amer.)  Date Value Ref Range Status  04/15/2012 >60  Final    Comment:    eGFR values <41mL/min/1.73 m2 may be an indication of chronic kidney disease (CKD). Calculated eGFR is useful in patients with stable renal function. The eGFR calculation will not be reliable in acutely ill patients when serum creatinine is changing rapidly. It is not useful in  patients on dialysis. The eGFR calculation may not be applicable to patients at the low and high extremes of body sizes, pregnant women, and vegetarians.    GFR calc non Af Amer  Date Value Ref Range Status  08/05/2020 90 >59 mL/min/1.73 Final   GFR  Date Value Ref Range Status  06/01/2015 82.19 >60.00 mL/min Final   eGFR  Date Value Ref Range Status  12/24/2022 98 >59 mL/min/1.73 Final  Passed - Patient is not pregnant      Passed - Valid encounter within last 12 months    Recent Outpatient Visits           2 weeks ago Cellulitis of right upper extremity   [place] [facility] [place], Megan P, DO   4 weeks ago Routine general medical examination at a health care facility   [facility] [place], Megan P, DO   3 months ago Laryngitis   [place] [facility] [place], [place], DO   5 months ago COVID-19   [place] [facility] Larae Grooms, NP   6 months ago Viral upper respiratory tract infection   [place] [facility] Mecum, Oswaldo Conroy, PA-C       Future Appointments             In 2 months McGowan, Elana Alm [facility] [place]   In 2 months Deirdre Evener, MD [facility]   In 5 months Laural Benes, Oralia Rud, DO [facility]

## 2023-01-23 ENCOUNTER — Other Ambulatory Visit: Payer: Self-pay

## 2023-01-23 MED FILL — Meloxicam Tab 15 MG: ORAL | 90 days supply | Qty: 90 | Fill #0 | Status: AC

## 2023-01-24 ENCOUNTER — Other Ambulatory Visit: Payer: Self-pay

## 2023-02-20 ENCOUNTER — Other Ambulatory Visit: Payer: Self-pay

## 2023-02-21 DIAGNOSIS — M1712 Unilateral primary osteoarthritis, left knee: Secondary | ICD-10-CM | POA: Diagnosis not present

## 2023-03-10 ENCOUNTER — Other Ambulatory Visit: Payer: Self-pay

## 2023-03-25 NOTE — Progress Notes (Unsigned)
03/26/23 2:53 PM   Latoya Lutz Latoya Lutz [birth year] [id number]  Referring provider:  Dorcas Carrow, DO [address] [place],  [place] [address]  Urological history  1. Urethral stricture - diagnosed by Dr. Leonette Monarch with urethral dilation and had been receiving serial dilations every 4 to 6 months since 2013 - managed with urethral dilation q 5 months at this time   2. Nephrolithiasis - right ureteral stones treated with ESWL and right URS/LL/right ureteral stent placement and stent removal in winter 2016 - CT Renal stone study performed on 04/05/2017 noted no nephroureterolithiasis.  No hydronephrosis  Chief Complaint  Patient presents with   Follow-up    Stricture    HPI: Latoya Lutz is a 52 y.o.female  who presents today for urethral dilation.    Previous records reviewed.   She has noticed an increase in her urinary frequency and nocturia.  Patient denies any modifying or aggravating factors.  Patient denies any recent UTI's, gross hematuria, dysuria or suprapubic/flank pain.  Patient denies any fevers, chills, nausea or vomiting.    UA unremarkable.   PMH: Past Medical History:  Diagnosis Date   Acid reflux    Actinic keratosis    Allergy    Arthritis    Atypical mole 10/27/2019   Left labia mucosa    Atypical nevus 05/25/2020   R labia majora - atypical proliferation   Candidal dermatitis 05/25/2014   Cervical spine disease    Chicken pox    Chronic urethral narrowing    undergoing stretching   Complication of anesthesia    Dysplastic nevus 04/05/2021   R post med thigh near popliteal, mod to severe atypia   Endometriosis    Dr. Gerrie Nordmann at [facility], removed    Esophageal reflux 05/25/2014   Gross hematuria 04/25/2015   H/O cystoscopy    normal   Heart murmur    Hx of basal cell carcinoma 12/29/2008   L low back   Hypertension    Infection of urinary tract 09/11/2014   Kidney stone 09/11/2014   Kidney stones    Microscopic hematuria 09/11/2014   Obesity,  morbid, BMI 40.0-49.9 (HCC) 04/10/2012   PONV (postoperative nausea and vomiting)    Right ureteral stone 04/28/2015   Skin cancer    Urinary retention     Surgical History: Past Surgical History:  Procedure Laterality Date   ABDOMINAL HYSTERECTOMY     BLADDER SURGERY  07/2003   CARPAL TUNNEL RELEASE Right    CESAREAN SECTION  1998   CHOLECYSTECTOMY     COLONOSCOPY WITH PROPOFOL N/A 08/24/2021   Procedure: COLONOSCOPY WITH PROPOFOL;  Surgeon: Midge Minium, MD;  Location: [facility] ENDOSCOPY;  Service: Endoscopy;  Laterality: N/A;  9 AM ARRIVAL, PLEASE   CYSTOSCOPY W/ RETROGRADES Right 05/16/2015   Procedure: CYSTOSCOPY WITH RETROGRADE PYELOGRAM;  Surgeon: Hildred Laser, MD;  Location: [facility] ORS;  Service: Urology;  Laterality: Right;   CYSTOSCOPY W/ URETERAL STENT PLACEMENT Right 05/16/2015   Procedure: CYSTOSCOPY WITH STENT REPLACEMENT;  Surgeon: Hildred Laser, MD;  Location: [facility] ORS;  Service: Urology;  Laterality: Right;   CYSTOSCOPY WITH STENT PLACEMENT Right 04/29/2015   Procedure: CYSTOSCOPY WITH STENT PLACEMENT;  Surgeon: Hildred Laser, MD;  Location: [facility] ORS;  Service: Urology;  Laterality: Right;   ESOPHAGOGASTRODUODENOSCOPY N/A 08/24/2021   Procedure: ESOPHAGOGASTRODUODENOSCOPY (EGD);  Surgeon: Midge Minium, MD;  Location[facility];  Service: Endoscopy;  Laterality: N/A;   EXTRACORPOREAL SHOCK WAVE LITHOTRIPSY Left 06/02/2015   Procedure: EXTRACORPOREAL SHOCK  WAVE LITHOTRIPSY (ESWL);  Surgeon: Vanna Scotland, MD;  Location: [facility] ORS;  Service: Urology;  Laterality: Left;   FOOT SURGERY  2003   KNEE SURGERY  2005   TONSILLECTOMY     TONSILLECTOMY     lingual growth on vocal cord removal   TOTAL ABDOMINAL HYSTERECTOMY W/ BILATERAL SALPINGOOPHORECTOMY     [facility] complete   URETEROSCOPY WITH HOLMIUM LASER LITHOTRIPSY Right 05/16/2015   Procedure: URETEROSCOPY WITH HOLMIUM LASER LITHOTRIPSY;  Surgeon: Hildred Laser, MD;  Location: [facility] ORS;  Service:  Urology;  Laterality: Right;    Home Medications:  Allergies as of 03/26/2023       Reactions   Atorvastatin Other (See Comments)   Leg cramps        Medication List        Accurate as of March 26, 2023  2:53 PM. If you have any questions, ask your nurse or doctor.          STOP taking these medications    amoxicillin-clavulanate 875-125 MG tablet Commonly known as: AUGMENTIN Stopped by: Carollee Herter Marlea Gambill       TAKE these medications    acyclovir 400 MG tablet Commonly known as: ZOVIRAX Take 1 tablet (400 mg total) by mouth 3 (three) times daily.   azelastine 0.1 % nasal spray Commonly known as: ASTELIN Place 2 sprays into both nostrils 2 (two) times daily. Use in each nostril as directed   busPIRone 10 MG tablet Commonly known as: BUSPAR Take 1 tablet (10 mg total) by mouth 3 (three) times daily.   CALCIUM 600 PO Take 1 tablet by mouth daily.   cyclobenzaprine 10 MG tablet Commonly known as: FLEXERIL Take 1 tablet (10 mg total) by mouth at bedtime.   diclofenac Sodium 1 % Gel Commonly known as: VOLTAREN Apply 2 g topically 4 (four) times daily.   ezetimibe 10 MG tablet Commonly known as: ZETIA Take 1 tablet (10 mg total) by mouth daily.   fexofenadine 180 MG tablet Commonly known as: ALLEGRA Take 180 mg by mouth daily.   fluticasone 50 MCG/ACT nasal spray Commonly known as: FLONASE PLACE 2 SPRAYS INTO BOTH NOSTRILS DAILY.   hydrochlorothiazide 25 MG tablet Commonly known as: HYDRODIURIL Take 1 tablet (25 mg total) by mouth daily.   Magnesium 100 MG Caps Take 30 mg by mouth 2 (two) times daily.   meloxicam 15 MG tablet Commonly known as: MOBIC Take 1 tablet (15 mg total) by mouth daily.   Menopause Formula Tabs Take by mouth. Reported on 11/04/2015   pantoprazole 40 MG tablet Commonly known as: PROTONIX Take 2 tablets (80 mg total) by mouth daily.   pravastatin 20 MG tablet Commonly known as: PRAVACHOL Take 1 tablet (20 mg total)  by mouth 3 (three) times a week. Tuesday,Thursday,Saturday   pseudoephedrine 30 MG tablet Commonly known as: SUDAFED Take 30 mg by mouth 3 (three) times daily.   Simethicone 180 MG Caps Take 1 capsule (180 mg total) by mouth 3 (three) times daily with meals.   sucralfate 1 g tablet Commonly known as: CARAFATE Take 1 tablet (1 g total) by mouth 4 (four) times daily -  with meals and at bedtime.   Unifine Pentips 31G X 8 MM Misc Generic drug: Insulin Pen Needle   vitamin E 180 MG (400 UNITS) capsule Take 400 Units by mouth daily.        Allergies:  Allergies  Allergen Reactions   Atorvastatin Other (See Comments)    Leg cramps  Family History: Family History  Problem Relation Age of Onset   Cancer Mother        pancreatic cancer   Heart disease Mother        Pacemaker and defib   Hypertension Mother    Hyperlipidemia Mother    Early death Mother    Stroke Mother    Varicose Veins Mother    Cancer Father    COPD Father        Lung and brain cancer   Early death Father    Cancer Maternal Grandmother        Colon   Diabetes Paternal Grandmother    Cirrhosis Paternal Grandmother    Cancer Paternal Grandmother    Kidney disease Neg Hx    Bladder Cancer Neg Hx    Breast cancer Neg Hx     Social History:  reports that she quit smoking about 14 years ago. Her smoking use included cigarettes. She started smoking about 31 years ago. She has a 8.5 pack-year smoking history. She has been exposed to tobacco smoke. She has never used smokeless tobacco. She reports that she does not drink alcohol and does not use drugs.   Physical Exam: Vitals:   03/26/23 1404  BP: 135/81  Pulse: 73   Constitutional:  Well nourished. Alert and oriented, No acute distress. HEENT:  AT, moist mucus membranes.  Trachea midline Cardiovascular: No clubbing, cyanosis, or edema. Respiratory: Normal respiratory effort, no increased work of breathing. GU: No CVA tenderness.  No bladder  fullness or masses.  No urethral masses, tenderness and/or tenderness. No bladder fullness, tenderness or masses. Normal vagina mucosa, good estrogen effect, no discharge, no lesions.   Anus and perineum are without rashes or lesions.    Neurologic: Grossly intact, no focal deficits, moving all 4 extremities. Psychiatric: Normal mood and affect.    Laboratory Data: Urinalysis  See EPIC and HPI I have reviewed the labs.   Pertinent Imaging N/A  Procedure  Patient is placed in stirrups and her urethral meatus and vulva are cleansed with Betadine.  2% Lidocaine jelly was inserted into her urethra.  I then dilated her with Leta Jungling sounds to a 32 without difficultly.  She tolerated the procedure well.  She will return in 5 months.   Assessment & Plan:    1. Urethral stricture --ua unremarkable   Return for follow-up.  [facility] [address] [place], [place] [address] [phone]

## 2023-03-26 ENCOUNTER — Ambulatory Visit (INDEPENDENT_AMBULATORY_CARE_PROVIDER_SITE_OTHER): Payer: 59 | Admitting: Urology

## 2023-03-26 ENCOUNTER — Encounter: Payer: Self-pay | Admitting: Urology

## 2023-03-26 VITALS — BP 135/81 | HR 73 | Ht 65.0 in

## 2023-03-26 DIAGNOSIS — N3592 Unspecified urethral stricture, female: Secondary | ICD-10-CM | POA: Diagnosis not present

## 2023-03-26 LAB — URINALYSIS, COMPLETE
Bilirubin, UA: NEGATIVE
Glucose, UA: NEGATIVE
Ketones, UA: NEGATIVE
Leukocytes,UA: NEGATIVE
Nitrite, UA: NEGATIVE
Protein,UA: NEGATIVE
RBC, UA: NEGATIVE
Specific Gravity, UA: 1.015 (ref 1.005–1.030)
Urobilinogen, Ur: 0.2 mg/dL (ref 0.2–1.0)
pH, UA: 6.5 (ref 5.0–7.5)

## 2023-03-26 LAB — MICROSCOPIC EXAMINATION: Bacteria, UA: NONE SEEN

## 2023-03-27 ENCOUNTER — Other Ambulatory Visit: Payer: Self-pay

## 2023-04-06 ENCOUNTER — Other Ambulatory Visit: Payer: Self-pay | Admitting: Family Medicine

## 2023-04-08 ENCOUNTER — Other Ambulatory Visit: Payer: Self-pay | Admitting: Family Medicine

## 2023-04-08 ENCOUNTER — Other Ambulatory Visit: Payer: Self-pay

## 2023-04-09 ENCOUNTER — Other Ambulatory Visit: Payer: Self-pay

## 2023-04-09 MED FILL — Diclofenac Sodium Gel 1% (1.16% Diethylamine Equiv): CUTANEOUS | 37 days supply | Qty: 300 | Fill #0 | Status: AC

## 2023-04-09 NOTE — Telephone Encounter (Signed)
Requested Prescriptions  Pending Prescriptions Disp Refills   diclofenac Sodium (VOLTAREN) 1 % GEL [Pharmacy Med Name: diclofenac Sodium (VOLTAREN) 1 % Gel] 300 g 0    Sig: Apply 2 g topically 4 (four) times daily.     Analgesics:  Topicals Failed - 04/08/2023 10:34 AM      Failed - Manual Review: Labs are only required if the patient has taken medication for more than 8 weeks.      Passed - PLT in normal range and within 360 days    Platelets  Date Value Ref Range Status  12/24/2022 261 150 - 450 x10E3/uL Final         Passed - HGB in normal range and within 360 days    Hemoglobin  Date Value Ref Range Status  12/24/2022 12.5 11.1 - 15.9 g/dL Final         Passed - HCT in normal range and within 360 days    Hematocrit  Date Value Ref Range Status  12/24/2022 39.0 34.0 - 46.6 % Final         Passed - Cr in normal range and within 360 days    Creatinine  Date Value Ref Range Status  04/15/2012 0.79 0.60 - 1.30 mg/dL Final   Creatinine, Ser  Date Value Ref Range Status  12/24/2022 0.73 0.57 - 1.00 mg/dL Final   Creatinine,U  Date Value Ref Range Status  06/01/2015 93.7 mg/dL Final         Passed - eGFR is 30 or above and within 360 days    EGFR (African American)  Date Value Ref Range Status  04/15/2012 >60  Final   GFR calc Af Amer  Date Value Ref Range Status  08/05/2020 104 >59 mL/min/1.73 Final    Comment:    **In accordance with recommendations from the NKF-ASN Task force,**   [facility] is in the process of updating its eGFR calculation to the   2021 CKD-EPI creatinine equation that estimates kidney function   without a race variable.    EGFR (Non-African Amer.)  Date Value Ref Range Status  04/15/2012 >60  Final    Comment:    eGFR values <43mL/min/1.73 m2 may be an indication of chronic kidney disease (CKD). Calculated eGFR is useful in patients with stable renal function. The eGFR calculation will not be reliable in acutely ill patients when serum  creatinine is changing rapidly. It is not useful in  patients on dialysis. The eGFR calculation may not be applicable to patients at the low and high extremes of body sizes, pregnant women, and vegetarians.    GFR calc non Af Amer  Date Value Ref Range Status  08/05/2020 90 >59 mL/min/1.73 Final   GFR  Date Value Ref Range Status  06/01/2015 82.19 >60.00 mL/min Final   eGFR  Date Value Ref Range Status  12/24/2022 98 >59 mL/min/1.73 Final         Passed - Patient is not pregnant      Passed - Valid encounter within last 12 months    Recent Outpatient Visits           3 months ago Cellulitis of right upper extremity   [place] [facility] [place], Megan P, DO   3 months ago Routine general medical examination at a health care facility   [facility], Megan P, DO   6 months ago Laryngitis   [place] [facility] [place], [place]  8 months ago COVID-19   [facility] Larae Grooms, NP   9 months ago Viral upper respiratory tract infection   [place] [facility] Mecum, Oswaldo Conroy, PA-C       Future Appointments             In 2 days Deirdre Evener, MD [facility]   In 2 months Laural Benes, Oralia Rud, DO [place] [facility]   In 4 months McGowan, Elana Alm [facility]

## 2023-04-11 ENCOUNTER — Ambulatory Visit (INDEPENDENT_AMBULATORY_CARE_PROVIDER_SITE_OTHER): Payer: 59 | Admitting: Dermatology

## 2023-04-11 ENCOUNTER — Encounter: Payer: Self-pay | Admitting: Dermatology

## 2023-04-11 DIAGNOSIS — L814 Other melanin hyperpigmentation: Secondary | ICD-10-CM

## 2023-04-11 DIAGNOSIS — L578 Other skin changes due to chronic exposure to nonionizing radiation: Secondary | ICD-10-CM

## 2023-04-11 DIAGNOSIS — D229 Melanocytic nevi, unspecified: Secondary | ICD-10-CM

## 2023-04-11 DIAGNOSIS — Z7189 Other specified counseling: Secondary | ICD-10-CM

## 2023-04-11 DIAGNOSIS — Z79899 Other long term (current) drug therapy: Secondary | ICD-10-CM | POA: Diagnosis not present

## 2023-04-11 DIAGNOSIS — Z85828 Personal history of other malignant neoplasm of skin: Secondary | ICD-10-CM | POA: Diagnosis not present

## 2023-04-11 DIAGNOSIS — Z1283 Encounter for screening for malignant neoplasm of skin: Secondary | ICD-10-CM

## 2023-04-11 DIAGNOSIS — L409 Psoriasis, unspecified: Secondary | ICD-10-CM

## 2023-04-11 DIAGNOSIS — Z86018 Personal history of other benign neoplasm: Secondary | ICD-10-CM

## 2023-04-11 MED ORDER — ZORYVE 0.3 % EX CREA: TOPICAL_CREAM | CUTANEOUS | 2 refills | Status: AC

## 2023-04-11 NOTE — Patient Instructions (Signed)
Start Zoryve cream once daily to affected areas    Your prescription was sent to [facility] in [place]. A representative from [facility] will contact you within 3 business hours to verify your address and insurance information to schedule a free delivery. If for any reason you do not receive a phone call from them, please reach out to them. Their phone number is [phone] and their hours are Monday-Friday 9:00 am-5:00 pm.    Recommend daily broad spectrum sunscreen SPF 30+ to sun-exposed areas, reapply every 2 hours as needed. Call for new or changing lesions.  Staying in the shade or wearing long sleeves, sun glasses (UVA+UVB protection) and wide brim hats (4-inch brim around the entire circumference of the hat) are also recommended for sun protection.     Melanoma ABCDEs  Melanoma is the most dangerous type of skin cancer, and is the leading cause of death from skin disease.  You are more likely to develop melanoma if you: Have light-colored skin, light-colored eyes, or red or blond hair Spend a lot of time in the sun Tan regularly, either outdoors or in a tanning bed Have had blistering sunburns, especially during childhood Have a close family member who has had a melanoma Have atypical moles or large birthmarks  Early detection of melanoma is key since treatment is typically straightforward and cure rates are extremely high if we catch it early.   The first sign of melanoma is often a change in a mole or a new dark spot.  The ABCDE system is a way of remembering the signs of melanoma.  A for asymmetry:  The two halves do not match. B for border:  The edges of the growth are irregular. C for color:  A mixture of colors are present instead of an even brown color. D for diameter:  Melanomas are usually (but not always) greater than 6mm - the size of a pencil eraser. E for evolution:  The spot keeps changing in size, shape, and color.  Please check your skin once per  month between visits. You can use a small mirror in front and a large mirror behind you to keep an eye on the back side or your body.   If you see any new or changing lesions before your next follow-up, please call to schedule a visit.  Please continue daily skin protection including broad spectrum sunscreen SPF 30+ to sun-exposed areas, reapplying every 2 hours as needed when you're outdoors.   Staying in the shade or wearing long sleeves, sun glasses (UVA+UVB protection) and wide brim hats (4-inch brim around the entire circumference of the hat) are also recommended for sun protection.      Due to recent changes in healthcare laws, you may see results of your pathology and/or laboratory studies on MyChart before the doctors have had a chance to review them. We understand that in some cases there may be results that are confusing or concerning to you. Please understand that not all results are received at the same time and often the doctors may need to interpret multiple results in order to provide you with the best plan of care or course of treatment. Therefore, we ask that you please give [place] 2 business days to thoroughly review all your results before contacting the office for clarification. Should we see a critical lab result, you will be contacted sooner.   If You Need Anything After Your Visit  If you have any questions or concerns for your doctor,  please call our main line at [phone] and press option 4 to reach your doctor's medical assistant. If no one answers, please leave a voicemail as directed and we will return your call as soon as possible. Messages left after 4 pm will be answered the following business day.   You may also send [place] a message via MyChart. We typically respond to MyChart messages within 1-2 business days.  For prescription refills, please ask your pharmacy to contact our office. Our fax number is [phone].  If you have an urgent issue when the clinic is  closed that cannot wait until the next business day, you can page your doctor at the number below.    Please note that while we do our best to be available for urgent issues outside of office hours, we are not available 24/7.   If you have an urgent issue and are unable to reach [place], you may choose to seek medical care at your doctor's office, retail clinic, urgent care center, or emergency room.  If you have a medical emergency, please immediately call 911 or go to the emergency department.  Pager Numbers  - Dr. Gwen Pounds: [phone]  - Dr. Roseanne Reno: [phone]  - Dr. Katrinka Blazing: [phone]   In the event of inclement weather, please call our main line at [phone] for an update on the status of any delays or closures.  Dermatology Medication Tips: Please keep the boxes that topical medications come in in order to help keep track of the instructions about where and how to use these. Pharmacies typically print the medication instructions only on the boxes and not directly on the medication tubes.   If your medication is too expensive, please contact our office at [phone] option 4 or send [place] a message through MyChart.   We are unable to tell what your co-pay for medications will be in advance as this is different depending on your insurance coverage. However, we may be able to find a substitute medication at lower cost or fill out paperwork to get insurance to cover a needed medication.   If a prior authorization is required to get your medication covered by your insurance company, please allow [place] 1-2 business days to complete this process.  Drug prices often vary depending on where the prescription is filled and some pharmacies may offer cheaper prices.  The website www.goodrx.com contains coupons for medications through different pharmacies. The prices here do not account for what the cost may be with help from insurance (it may be cheaper with your insurance), but the website can  give you the price if you did not use any insurance.  - You can print the associated coupon and take it with your prescription to the pharmacy.  - You may also stop by our office during regular business hours and pick up a GoodRx coupon card.  - If you need your prescription sent electronically to a different pharmacy, notify our office through [facility] or by phone at [phone] option 4.     Si Usted Necesita Algo Despus de Su Visita  Tambin puede enviarnos un mensaje a travs de Clinical cytogeneticist. Por lo general respondemos a los mensajes de MyChart en el transcurso de 1 a 2 das hbiles.  Para renovar recetas, por favor pida a su farmacia que se ponga en contacto con nuestra oficina. Annie Sable de fax es [place] [phone].  Si tiene un asunto urgente cuando la clnica est cerrada y que no puede esperar hasta el siguiente  da hbil, puede llamar/localizar a su doctor(a) al nmero que aparece a continuacin.   Por favor, tenga en cuenta que aunque hacemos todo lo posible para estar disponibles para asuntos urgentes fuera del horario de [place], no estamos disponibles las 24 horas del da, los 7 [address]  [address] de la [place].   Si tiene un problema urgente y no puede comunicarse con nosotros, puede optar por buscar atencin mdica  en el consultorio de su doctor(a), en una clnica privada, en un centro de atencin urgente o en una sala de emergencias.  Si tiene Engineer, drilling, por favor llame inmediatamente al 911 o vaya a la sala de emergencias.  Nmeros de bper  - Dr. Gwen Pounds: [phone]  - Dra. Roseanne Reno: [phone]  - Dr. Katrinka Blazing: [phone]   En caso de inclemencias del tiempo, por favor llame a Lacy Duverney principal al [phone] para una actualizacin sobre el [place] de cualquier retraso o cierre.  Consejos para la medicacin en dermatologa: Por favor, guarde las cajas en las que vienen los medicamentos de uso tpico para ayudarle a seguir las instrucciones sobre  dnde y cmo usarlos. Las farmacias generalmente imprimen las instrucciones del medicamento slo en las cajas y no directamente en los tubos del [place].   Si su medicamento es muy caro, por favor, pngase en contacto con Rolm Gala llamando al [phone] y presione la opcin 4 o envenos un mensaje a travs de Clinical cytogeneticist.   No podemos decirle cul ser su copago por los medicamentos por adelantado ya que esto es diferente dependiendo de la cobertura de su seguro. Sin embargo, es posible que podamos encontrar un medicamento sustituto a Audiological scientist un formulario para que el seguro cubra el medicamento que se considera necesario.   Si se requiere una autorizacin previa para que su compaa de seguros [place] su medicamento, por favor permtanos de 1 a 2 das hbiles para completar [address]  Los precios de los medicamentos varan con frecuencia dependiendo del Environmental consultant de dnde se surte la receta y alguna farmacias pueden ofrecer precios ms baratos.  El sitio web www.goodrx.com tiene cupones para medicamentos de Health and safety inspector. Los precios aqu no tienen en cuenta lo que podra costar con la ayuda del seguro (puede ser ms barato con su seguro), pero el sitio web puede darle el precio si no utiliz Tourist information centre manager.  - Puede imprimir el cupn correspondiente y llevarlo con su receta a la farmacia.  - Tambin puede pasar por nuestra oficina durante el horario de atencin regular y Education officer, museum una tarjeta de cupones de GoodRx.  - Si necesita que su receta se enve electrnicamente a una farmacia diferente, informe a nuestra oficina a travs de MyChart de [place] o por telfono llamando al [phone] y presione la opcin 4.

## 2023-04-11 NOTE — Progress Notes (Signed)
Follow-Up Visit   Subjective  Latoya Lutz is a 52 y.o. female who presents for the following: Skin Cancer Screening and Full Body Skin Exam. Hx of BCC. Hx of dysplastic nevus. Hx of AMP.   The patient presents for Total-Body Skin Exam (TBSE) for skin cancer screening and mole check. The patient has spots, moles and lesions to be evaluated, some may be new or changing and the patient may have concern these could be cancer.    The following portions of the chart were reviewed this encounter and updated as appropriate: medications, allergies, medical history  Review of Systems:  No other skin or systemic complaints except as noted in HPI or Assessment and Plan.  Objective  Well appearing patient in no apparent distress; mood and affect are within normal limits.  A full examination was performed including scalp, head, eyes, ears, nose, lips, neck, chest, axillae, abdomen, back, buttocks, bilateral upper extremities, bilateral lower extremities, hands, feet, fingers, toes, fingernails, and toenails. All findings within normal limits unless otherwise noted below.   Relevant physical exam findings are noted in the Assessment and Plan.       Assessment & Plan   HISTORY OF DYSPLASTIC NEVI No evidence of recurrence today Recommend regular full body skin exams Recommend daily broad spectrum sunscreen SPF 30+ to sun-exposed areas, reapply every 2 hours as needed.  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 05/25/2020. No evidence of recurrence today Recommend regular full body skin exams Recommend daily broad spectrum sunscreen SPF 30+ to sun-exposed areas, reapply every 2 hours as needed.  Call if any new or changing lesions are noted between office visits  Patient was seen by [facility] and by  Dr. Caprice Beaver [facility] and had additional biopsies done. She states  they were not concerned about the results and patient hasn't noticed recurrence. Continue annual vaginal exams with GYN or PCP -patient states today that she will follow with her urology PA Michiel Cowboy who sees her every 5 months for a genital exam.. Reviewed notes/records from [facility].  Also recommend following with Michiel Cowboy, PA-C, who she sees every 5 months for genital exam.  HISTORY OF BASAL CELL CARCINOMA OF THE SKIN. Left low back. 12/29/2008. - No evidence of recurrence today - Recommend regular full body skin exams - Recommend daily broad spectrum sunscreen SPF 30+ to sun-exposed areas, reapply every 2 hours as needed.  - Call if any new or changing lesions are noted between office visits   SKIN CANCER SCREENING PERFORMED TODAY.  ACTINIC DAMAGE - Chronic condition, secondary to cumulative UV/sun exposure - diffuse scaly erythematous macules with underlying dyspigmentation - Recommend daily broad spectrum sunscreen SPF 30+ to sun-exposed areas, reapply every 2 hours as needed.  - Staying in the shade or wearing long sleeves, sun glasses (UVA+UVB protection) and wide brim hats (4-inch brim around the entire circumference of the hat) are also recommended for sun protection.  - Call for new or changing lesions.  LENTIGINES, SEBORRHEIC KERATOSES, HEMANGIOMAS - Benign normal skin lesions - Benign-appearing - Call for any changes  MELANOCYTIC NEVI - Tan-brown and/or pink-flesh-colored symmetric macules and papules - Benign appearing on exam today - Observation - Call clinic for new or changing moles - Recommend daily use of broad spectrum spf 30+ sunscreen to sun-exposed areas.    PSORIASIS Exam: Well-demarcated erythematous papules/plaques with silvery scale, guttate pink  scaly papules. 2% BSA.  Chronic and persistent condition with duration or expected duration over one year. Condition is improving with treatment but not currently at goal.   Patient states she  have psoriatic arthritis  Psoriasis is a chronic non-curable, but treatable genetic/hereditary disease that may have other systemic features affecting other organ systems such as joints (Psoriatic Arthritis). It is associated with an increased risk of inflammatory bowel disease, heart disease, non-alcoholic fatty liver disease, and depression.  Treatments include light and laser treatments; topical medications; and systemic medications including oral and injectables.  Treatment Plan: Start Zoryve cream once daily to affected areas   MELANOCYTIC NEVI Exam:  1.1 cm brown thin papule at mid back 0.8 cm brown thin papule at left back 0.8 cm brown thin papule at right back  Treatment Plan: Benign appearing on exam today. Recommend observation. Call clinic for new or changing moles. Recommend daily use of broad spectrum spf 30+ sunscreen to sun-exposed areas.    Return in about 1 year (around 04/10/2024) for TBSE, HxBCC, HxDN, HxAMP.  I, Lawson Radar, CMA, am acting as scribe for Armida Sans, MD.   Documentation: I have reviewed the above documentation for accuracy and completeness, and I agree with the above.  Armida Sans, MD

## 2023-04-12 ENCOUNTER — Other Ambulatory Visit: Payer: Self-pay

## 2023-04-12 ENCOUNTER — Ambulatory Visit: Payer: 59 | Admitting: Family Medicine

## 2023-04-12 ENCOUNTER — Encounter: Payer: Self-pay | Admitting: Family Medicine

## 2023-04-12 VITALS — BP 145/82 | HR 68 | Temp 97.8°F | Ht 64.96 in | Wt 294.6 lb

## 2023-04-12 DIAGNOSIS — R0981 Nasal congestion: Secondary | ICD-10-CM | POA: Insufficient documentation

## 2023-04-12 MED ORDER — PREDNISONE 10 MG PO TABS
ORAL_TABLET | ORAL | 0 refills | Status: AC
Start: 2023-04-12 — End: 2023-04-18
  Filled 2023-04-12: qty 21, 6d supply, fill #0

## 2023-04-12 MED ORDER — BENZONATATE 100 MG PO CAPS
100.0000 mg | ORAL_CAPSULE | Freq: Two times a day (BID) | ORAL | 0 refills | Status: DC | PRN
  Filled 2023-04-12: qty 20, 10d supply, fill #0

## 2023-04-12 NOTE — Patient Instructions (Addendum)
Continue taking Flonase daily  Warm liquids such as hot tea or soup Try Mucinex instead of Sudafed

## 2023-04-12 NOTE — Assessment & Plan Note (Signed)
Acute, ongoing. Flu and strep -, awaiting COVID. Will treat with 6 day Prednisone taper, tessalon BID PRN, recommend mucinex, warm liquids such as hot tea and soup, and Flonase PRN. Return if symptoms fail to improve, if worsened in 3 days will treat for sinusitis. Call sooner if concerns arise.

## 2023-04-12 NOTE — Progress Notes (Signed)
BP (!) 145/82   Pulse 68   Temp 97.8 F (36.6 C) (Oral)   Ht 5' 4.96" (1.65 m)   Wt 294 lb 9.6 oz (133.6 kg)   SpO2 98%   BMI 49.08 kg/m    Subjective:    Patient ID: Latoya Lutz, female    DOB: [birth year], 52 y.o.   MRN: [id number]  HPI: Latoya Lutz is a 52 y.o. female  Chief Complaint  Patient presents with   URI   UPPER RESPIRATORY TRACT INFECTION She has been dealing with a lot of head congestion for 3 days now. Has history of laryngitis this year.  Worst symptom: Sinus pressure Fever: no Cough: yes Shortness of breath: no Wheezing: no Chest pain: no Chest tightness: no Chest congestion: no Nasal congestion: yes Runny nose: yes Post nasal drip: yes Sneezing: yes Sore throat: yes Swollen glands: no Sinus pressure: yes Headache: yes Face pain: yes Toothache: no Ear pain: no  Ear pressure: no  Eyes red/itching:no Eye drainage/crusting: no  Vomiting: no Rash: no Fatigue: yes Sick contacts: no Strep contacts: no  Context: worse Recurrent sinusitis: no Relief with OTC cold/cough medications: no  Treatments attempted:  DayQuil, severe cold and flu tablets     Relevant past medical, surgical, family and social history reviewed and updated as indicated. Interim medical history since our last visit reviewed. Allergies and medications reviewed and updated.  Review of Systems  Constitutional:  Negative for chills, fatigue and fever.  HENT:  Positive for congestion, postnasal drip, rhinorrhea, sinus pressure, sinus pain, sneezing and sore throat. Negative for ear pain.   Eyes:  Negative for discharge, redness and itching.  Respiratory:  Positive for cough. Negative for chest tightness, shortness of breath and wheezing.   Cardiovascular:  Negative for chest pain.  Gastrointestinal:  Negative for vomiting.  Skin:  Negative for rash.  Neurological:  Negative for headaches.    Per HPI unless specifically indicated above     Objective:     BP (!) 145/82   Pulse 68   Temp 97.8 F (36.6 C) (Oral)   Ht 5' 4.96" (1.65 m)   Wt 294 lb 9.6 oz (133.6 kg)   SpO2 98%   BMI 49.08 kg/m   Wt Readings from Last 3 Encounters:  04/12/23 294 lb 9.6 oz (133.6 kg)  01/07/23 293 lb 9.6 oz (133.2 kg)  12/24/22 295 lb 9.6 oz (134.1 kg)    Physical Exam Vitals and nursing note reviewed.  Constitutional:      General: She is awake. She is not in acute distress.    Appearance: Normal appearance. She is well-developed and well-groomed. She is not ill-appearing, toxic-appearing or diaphoretic.  HENT:     Head: Normocephalic and atraumatic.     Right Ear: Hearing, tympanic membrane, ear canal and external ear normal. No drainage. There is no impacted cerumen. Tympanic membrane is not erythematous.     Left Ear: Hearing, tympanic membrane, ear canal and external ear normal. No drainage. There is no impacted cerumen. Tympanic membrane is not erythematous.     Nose: Congestion and rhinorrhea present.     Right Turbinates: Pale. Not swollen.     Left Turbinates: Pale. Not swollen.     Right Sinus: Maxillary sinus tenderness and frontal sinus tenderness present.     Left Sinus: Maxillary sinus tenderness and frontal sinus tenderness present.     Mouth/Throat:     Mouth: Mucous membranes are moist.     Pharynx:  Oropharynx is clear. Postnasal drip present. No oropharyngeal exudate or posterior oropharyngeal erythema.  Eyes:     General: Lids are normal. No scleral icterus.       Right eye: No discharge.        Left eye: No discharge.     Extraocular Movements: Extraocular movements intact.     Conjunctiva/sclera: Conjunctivae normal.     Pupils: Pupils are equal, round, and reactive to light.  Neck:     Vascular: No carotid bruit.  Cardiovascular:     Rate and Rhythm: Normal rate and regular rhythm.     Pulses: Normal pulses.          Radial pulses are 2+ on the right side and 2+ on the left side.       Posterior tibial pulses are 2+ on the  right side and 2+ on the left side.     Heart sounds: Normal heart sounds, S1 normal and S2 normal. No murmur heard.    No friction rub. No gallop.  Pulmonary:     Effort: Pulmonary effort is normal. No accessory muscle usage or respiratory distress.     Breath sounds: Normal breath sounds. No stridor. No wheezing, rhonchi or rales.  Chest:     Chest wall: No tenderness.  Musculoskeletal:        General: Normal range of motion.     Cervical back: Full passive range of motion without pain, normal range of motion and neck supple. No rigidity. No muscular tenderness.     Right lower leg: No edema.     Left lower leg: No edema.  Lymphadenopathy:     Cervical: No cervical adenopathy.  Skin:    General: Skin is warm and dry.     Capillary Refill: Capillary refill takes less than 2 seconds.     Coloration: Skin is not jaundiced or pale.     Findings: No bruising, erythema, lesion or rash.  Neurological:     General: No focal deficit present.     Mental Status: She is alert and oriented to person, place, and time. Mental status is at baseline.     Cranial Nerves: No cranial nerve deficit.     Sensory: No sensory deficit.     Motor: No weakness.     Coordination: Coordination normal.     Gait: Gait normal.     Deep Tendon Reflexes: Reflexes normal.  Psychiatric:        Attention and Perception: Attention normal.        Mood and Affect: Mood normal.        Speech: Speech normal.        Behavior: Behavior normal. Behavior is cooperative.        Thought Content: Thought content normal.        Judgment: Judgment normal.     Results for orders placed or performed in visit on 03/26/23  Microscopic Examination   Urine  Result Value Ref Range   WBC, UA 0-5 0 - 5 /hpf   RBC, Urine 0-2 0 - 2 /hpf   Epithelial Cells (non renal) 0-10 0 - 10 /hpf   Bacteria, UA None seen None seen/Few  Urinalysis, Complete  Result Value Ref Range   Specific Gravity, UA 1.015 1.005 - 1.030   pH, UA 6.5 5.0  - 7.5   Color, UA Yellow Yellow   Appearance Ur Clear Clear   Leukocytes,UA Negative Negative   Protein,UA Negative Negative/Trace   Glucose, UA Negative  Negative   Ketones, UA Negative Negative   RBC, UA Negative Negative   Bilirubin, UA Negative Negative   Urobilinogen, Ur 0.2 0.2 - 1.0 mg/dL   Nitrite, UA Negative Negative   Microscopic Examination See below:       Assessment & Plan:   Problem List Items Addressed This Visit     Sinus congestion - Primary    Acute, ongoing. Flu and strep -, awaiting COVID. Will treat with 6 day Prednisone taper, tessalon BID PRN, recommend mucinex, warm liquids such as hot tea and soup, and Flonase PRN. Return if symptoms fail to improve, if worsened in 3 days will treat for sinusitis. Call sooner if concerns arise.       Relevant Orders   Novel Coronavirus, NAA ([facility])   Veritor Flu A/B Waived   Rapid Strep screen([facility])     Follow up plan: Return if symptoms worsen or fail to improve.

## 2023-04-13 LAB — NOVEL CORONAVIRUS, NAA: SARS-CoV-2, NAA: NOT DETECTED

## 2023-04-16 LAB — VERITOR FLU A/B WAIVED
Influenza A: NEGATIVE
Influenza B: NEGATIVE

## 2023-04-16 LAB — RAPID STREP SCREEN (MED CTR MEBANE ONLY): Strep Gp A Ag, IA W/Reflex: NEGATIVE

## 2023-04-16 LAB — CULTURE, GROUP A STREP: Strep A Culture: NEGATIVE

## 2023-04-16 NOTE — Progress Notes (Signed)
Hi Latoya Lutz, your COVID and strep culture results have returned negative. Thank you for allowing me to participate in your care.

## 2023-04-18 ENCOUNTER — Ambulatory Visit: Payer: Self-pay

## 2023-04-18 NOTE — Telephone Encounter (Signed)
Patient called, left VM to return the call to the office to speak to the NT.   Summary: cough/congestion   Patient states that she was seen in office last week for cough and congestion. Patient was given prednisone and told to call back if she was not feeling better after finishing the prednisone and the provider would call in an antibiotic. Patient states that she still has a bad cough.

## 2023-04-18 NOTE — Telephone Encounter (Signed)
  Chief Complaint: cough not getting better, wheezing, requesting antibiotic , inhaler and flonase  Symptoms: continued cough, less green mucus but not clear, hoarseness, wheezing , using an inhaler but it has expired 2023. Using delsym for cough. Reports tessalon capsules not effective.  Frequency: last week Pertinent Negatives: Patient denies chest pain no difficulty breathing no fever Disposition: [] ED /[] Urgent Care (no appt availability in office) / [] Appointment(In office/virtual)/ []  [facility]/ [] Home Care/ [] Refused Recommended Disposition /[]  Mobile Bus/ [x]  Follow-up with PCP Additional Notes:   Told to call back if not feeling well and patient not feeling better. Please advise   Summary: cough/congestion   Patient states that she was seen in office last week for cough and congestion. Patient was given prednisone and told to call back if she was not feeling better after finishing the prednisone and the provider would call in an antibiotic. Patient states that she still has a bad cough.             Reason for Disposition  SEVERE coughing spells (e.g., whooping sound after coughing, vomiting after coughing)  Answer Assessment - Initial Assessment Questions 1. ONSET: "When did the cough begin?"      Last week  2. SEVERITY: "How bad is the cough today?"      Productive  3. SPUTUM: "Describe the color of your sputum" (none, dry cough; clear, white, yellow, green)     Less green but not cleared 4. HEMOPTYSIS: "Are you coughing up any blood?" If so ask: "How much?" (flecks, streaks, tablespoons, etc.)     Na  5. DIFFICULTY BREATHING: "Are you having difficulty breathing?" If Yes, ask: "How bad is it?" (e.g., mild, moderate, severe)    - MILD: No SOB at rest, mild SOB with walking, speaks normally in sentences, can lie down, no retractions, pulse < 100.    - MODERATE: SOB at rest, SOB with minimal exertion and prefers to sit, cannot lie down flat, speaks  in phrases, mild retractions, audible wheezing, pulse 100-120.    - SEVERE: Very SOB at rest, speaks in single words, struggling to breathe, sitting hunched forward, retractions, pulse > 120      Denies SOB but is wheezing.  6. FEVER: "Do you have a fever?" If Yes, ask: "What is your temperature, how was it measured, and when did it start?"     No  7. CARDIAC HISTORY: "Do you have any history of heart disease?" (e.g., heart attack, congestive heart failure)      Na  8. LUNG HISTORY: "Do you have any history of lung disease?"  (e.g., pulmonary embolus, asthma, emphysema)     Na  9. PE RISK FACTORS: "Do you have a history of blood clots?" (or: recent major surgery, recent prolonged travel, bedridden)     Na  10. OTHER SYMPTOMS: "Do you have any other symptoms?" (e.g., runny nose, wheezing, chest pain)       Wheezing using inhaler, cough still productive less green , hoarseness.  11. PREGNANCY: "Is there any chance you are pregnant?" "When was your last menstrual period?"       na 12. TRAVEL: "Have you traveled out of the country in the last month?" (e.g., travel history, exposures)       na  Protocols used: Cough - Acute Productive-A-AH

## 2023-04-19 ENCOUNTER — Telehealth: Payer: Self-pay | Admitting: Family Medicine

## 2023-04-19 ENCOUNTER — Other Ambulatory Visit: Payer: Self-pay

## 2023-04-19 MED ORDER — ALBUTEROL SULFATE HFA 108 (90 BASE) MCG/ACT IN AERS
2.0000 | INHALATION_SPRAY | Freq: Four times a day (QID) | RESPIRATORY_TRACT | 3 refills | Status: AC | PRN
  Filled 2023-04-19: qty 6.7, 25d supply, fill #0
  Filled 2023-10-13: qty 6.7, 25d supply, fill #1

## 2023-04-19 MED ORDER — AMOXICILLIN-POT CLAVULANATE 875-125 MG PO TABS
1.0000 | ORAL_TABLET | Freq: Two times a day (BID) | ORAL | 0 refills | Status: AC
  Filled 2023-04-19: qty 14, 7d supply, fill #0

## 2023-04-19 NOTE — Telephone Encounter (Signed)
Called and notified patient that a medication has been sent in for her. Patent also asked to have a new albuterol inhaler sent in for her as well. RX t'd up for provider.

## 2023-04-19 NOTE — Telephone Encounter (Signed)
Ms Keast is calling back again and wanting to know the status of what is going on  and would like a call back. Please advise

## 2023-04-19 NOTE — Telephone Encounter (Signed)
Patient has called to follow up on a NT encounter that was sent over yesterday 04/18/2023. Patient states she is waiting for a call back from the office to see if provider can send additonal medication in and she has not heard anything. Please see NT encounter from yesterday 04/18/2023. Patients callback #  [phone].

## 2023-04-19 NOTE — Telephone Encounter (Signed)
Routing to the provider who saw the patient. See other phone call from yesterday.

## 2023-05-22 ENCOUNTER — Other Ambulatory Visit: Payer: 59

## 2023-05-22 ENCOUNTER — Other Ambulatory Visit: Payer: Self-pay | Admitting: Urology

## 2023-05-22 ENCOUNTER — Other Ambulatory Visit: Payer: Self-pay

## 2023-05-22 DIAGNOSIS — R109 Unspecified abdominal pain: Secondary | ICD-10-CM

## 2023-05-22 DIAGNOSIS — R3989 Other symptoms and signs involving the genitourinary system: Secondary | ICD-10-CM | POA: Diagnosis not present

## 2023-05-22 LAB — URINALYSIS, COMPLETE
Bilirubin, UA: NEGATIVE
Glucose, UA: NEGATIVE
Ketones, UA: NEGATIVE
Leukocytes,UA: NEGATIVE
Nitrite, UA: NEGATIVE
Protein,UA: NEGATIVE
RBC, UA: NEGATIVE
Specific Gravity, UA: 1.01 (ref 1.005–1.030)
Urobilinogen, Ur: 0.2 mg/dL (ref 0.2–1.0)
pH, UA: 6 (ref 5.0–7.5)

## 2023-05-22 LAB — MICROSCOPIC EXAMINATION

## 2023-05-22 NOTE — Progress Notes (Unsigned)
05/23/23 10:46 AM   [name] [name] 19-Aug-1970 [id number]  Referring provider:  Dorcas Carrow, DO [address] [place],  [place] [address]  Urological history  1. Urethral stricture - diagnosed by Dr. Leonette Monarch with urethral dilation and had been receiving serial dilations every 4 to 6 months since 2013 - managed with urethral dilation q 5 months at this time   2. Nephrolithiasis - right ureteral stones treated with ESWL and right URS/LL/right ureteral stent placement and stent removal in winter 2016 - CT Renal stone study performed on 04/05/2017 noted no nephroureterolithiasis.  No hydronephrosis  No chief complaint on file.   HPI: DOMINICK STONEMAN is a 52 y.o.female  who presents today for lower abdominal pressure, urge to void every 30 minutes, chills and fatigued.    Previous records reviewed.   UA unremarkable.   KUB no stones   Yesterday she had the sudden onset of chills, suprapubic pressure and urinary urgency having to void every 30 minutes.  Last evening she went to bed about 7 because she did not feel well got up at 730 urinate got up at 8 to urinate got up at 830 and then slept through the night until she woke up her normal time this morning.  She has been asymptomatic since 830 last night.  She does not remember passage of a distinct fragment.  Patient denies any modifying or aggravating factors.  Patient denies any recent UTI's, gross hematuria, dysuria or suprapubic/flank pain.  Patient denies any fevers, chills, nausea or vomiting.    PMH: Past Medical History:  Diagnosis Date   Acid reflux    Actinic keratosis    Allergy    Arthritis    Atypical mole 10/27/2019   Left labia mucosa    Atypical nevus 05/25/2020   R labia majora - atypical proliferation   Candidal dermatitis 05/25/2014   Cervical spine disease    Chicken pox    Chronic urethral narrowing    undergoing stretching   Complication of anesthesia    Dysplastic nevus 04/05/2021   R post med  thigh near popliteal, mod to severe atypia   Endometriosis    Dr. Gerrie Nordmann at [facility], removed    Esophageal reflux 05/25/2014   Gross hematuria 04/25/2015   H/O cystoscopy    normal   Heart murmur    Hx of basal cell carcinoma 12/29/2008   L low back   Hypertension    Infection of urinary tract 09/11/2014   Kidney stone 09/11/2014   Kidney stones    Microscopic hematuria 09/11/2014   Obesity, morbid, BMI 40.0-49.9 (HCC) 04/10/2012   PONV (postoperative nausea and vomiting)    Right ureteral stone 04/28/2015   Skin cancer    Urinary retention     Surgical History: Past Surgical History:  Procedure Laterality Date   ABDOMINAL HYSTERECTOMY     BLADDER SURGERY  07/2003   CARPAL TUNNEL RELEASE Right    CESAREAN SECTION  1998   CHOLECYSTECTOMY     COLONOSCOPY WITH PROPOFOL N/A 08/24/2021   Procedure: COLONOSCOPY WITH PROPOFOL;  Surgeon: Midge Minium, MD;  Location: [facility] ENDOSCOPY;  Service: Endoscopy;  Laterality: N/A;  9 AM ARRIVAL, PLEASE   CYSTOSCOPY W/ RETROGRADES Right 05/16/2015   Procedure: CYSTOSCOPY WITH RETROGRADE PYELOGRAM;  Surgeon: Hildred Laser, MD;  Location: [facility] ORS;  Service: Urology;  Laterality: Right;   CYSTOSCOPY W/ URETERAL STENT PLACEMENT Right 05/16/2015   Procedure: CYSTOSCOPY WITH STENT REPLACEMENT;  Surgeon: Hildred Laser, MD;  Location: [facility] ORS;  Service: Urology;  Laterality: Right;   CYSTOSCOPY WITH STENT PLACEMENT Right 04/29/2015   Procedure: CYSTOSCOPY WITH STENT PLACEMENT;  Surgeon: Hildred Laser, MD;  Location: [facility] ORS;  Service: Urology;  Laterality: Right;   ESOPHAGOGASTRODUODENOSCOPY N/A 08/24/2021   Procedure: ESOPHAGOGASTRODUODENOSCOPY (EGD);  Surgeon: Midge Minium, MD;  Location: [facility] ENDOSCOPY;  Service: Endoscopy;  Laterality: N/A;   EXTRACORPOREAL SHOCK WAVE LITHOTRIPSY Left 06/02/2015   Procedure: EXTRACORPOREAL SHOCK WAVE LITHOTRIPSY (ESWL);  Surgeon: Vanna Scotland, MD;  Location: [facility] ORS;  Service: Urology;  Laterality:  Left;   FOOT SURGERY  2003   KNEE SURGERY  2005   TONSILLECTOMY     TONSILLECTOMY     lingual growth on vocal cord removal   TOTAL ABDOMINAL HYSTERECTOMY W/ BILATERAL SALPINGOOPHORECTOMY     [facility] complete   URETEROSCOPY WITH HOLMIUM LASER LITHOTRIPSY Right 05/16/2015   Procedure: URETEROSCOPY WITH HOLMIUM LASER LITHOTRIPSY;  Surgeon: Hildred Laser, MD;  Location: [facility] ORS;  Service: Urology;  Laterality: Right;    Home Medications:  Allergies as of 05/23/2023       Reactions   Atorvastatin Other (See Comments)   Leg cramps        Medication List        Accurate as of May 23, 2023 10:46 AM. If you have any questions, ask your nurse or doctor.          acyclovir 400 MG tablet Commonly known as: ZOVIRAX Take 1 tablet (400 mg total) by mouth 3 (three) times daily.   albuterol 108 (90 Base) MCG/ACT inhaler Commonly known as: VENTOLIN HFA Inhale 2 puffs into the lungs every 6 (six) hours as needed for wheezing or shortness of breath.   Azelastine HCl 137 MCG/SPRAY Soln Place 2 sprays into both nostrils 2 (two) times daily. Use in each nostril as directed   benzonatate 100 MG capsule Commonly known as: TESSALON Take 1 capsule (100 mg total) by mouth 2 (two) times daily as needed.   busPIRone 10 MG tablet Commonly known as: BUSPAR Take 1 tablet (10 mg total) by mouth 3 (three) times daily.   CALCIUM 600 PO Take 1 tablet by mouth daily.   cyclobenzaprine 10 MG tablet Commonly known as: FLEXERIL Take 1 tablet (10 mg total) by mouth at bedtime.   diclofenac Sodium 1 % Gel Commonly known as: VOLTAREN Apply 2 g topically 4 (four) times daily.   ezetimibe 10 MG tablet Commonly known as: ZETIA Take 1 tablet (10 mg total) by mouth daily.   fexofenadine 180 MG tablet Commonly known as: ALLEGRA Take 180 mg by mouth daily.   fluticasone 50 MCG/ACT nasal spray Commonly known as: FLONASE PLACE 2 SPRAYS INTO BOTH NOSTRILS DAILY.   hydrochlorothiazide 25 MG  tablet Commonly known as: HYDRODIURIL Take 1 tablet (25 mg total) by mouth daily.   Magnesium 100 MG Caps Take 30 mg by mouth 2 (two) times daily.   meloxicam 15 MG tablet Commonly known as: MOBIC Take 1 tablet (15 mg total) by mouth daily.   Menopause Formula Tabs Take by mouth. Reported on 11/04/2015   pantoprazole 40 MG tablet Commonly known as: PROTONIX Take 2 tablets (80 mg total) by mouth daily.   pravastatin 20 MG tablet Commonly known as: PRAVACHOL Take 1 tablet (20 mg total) by mouth 3 (three) times a week. Tuesday,Thursday,Saturday   pseudoephedrine 30 MG tablet Commonly known as: SUDAFED Take 30 mg by mouth 3 (three) times daily.   Simethicone 180 MG Caps  Take 1 capsule (180 mg total) by mouth 3 (three) times daily with meals.   sucralfate 1 g tablet Commonly known as: CARAFATE Take 1 tablet (1 g total) by mouth 4 (four) times daily -  with meals and at bedtime.   Unifine Pentips 31G X 8 MM Misc Generic drug: Insulin Pen Needle   vitamin E 180 MG (400 UNITS) capsule Take 400 Units by mouth daily.   Zoryve 0.3 % Crea Generic drug: Roflumilast Apply once daily to affected areas of psoriasis        Allergies:  Allergies  Allergen Reactions   Atorvastatin Other (See Comments)    Leg cramps    Family History: Family History  Problem Relation Age of Onset   Cancer Mother        pancreatic cancer   Heart disease Mother        Pacemaker and defib   Hypertension Mother    Hyperlipidemia Mother    Early death Mother    Stroke Mother    Varicose Veins Mother    Cancer Father    COPD Father        Lung and brain cancer   Early death Father    Cancer Maternal Grandmother        Colon   Diabetes Paternal Grandmother    Cirrhosis Paternal Grandmother    Cancer Paternal Grandmother    Kidney disease Neg Hx    Bladder Cancer Neg Hx    Breast cancer Neg Hx     Social History:  reports that she quit smoking about 15 years ago. Her smoking use  included cigarettes. She started smoking about 32 years ago. She has a 8.5 pack-year smoking history. She has been exposed to tobacco smoke. She has never used smokeless tobacco. She reports that she does not drink alcohol and does not use drugs.   Physical Exam: Constitutional:  Well nourished. Alert and oriented, No acute distress. HEENT: [place] AT, moist mucus membranes.  Trachea midline Cardiovascular: No clubbing, cyanosis, or edema. Respiratory: Normal respiratory effort, no increased work of breathing. Neurologic: Grossly intact, no focal deficits, moving all 4 extremities. Psychiatric: Normal mood and affect.   Laboratory Data: Urinalysis  See EPIC and HPI I have reviewed the labs.   Pertinent Imaging KUB no stones seen, radiologist interpretation pending I have independently reviewed the films.  See HPI.     Assessment & Plan:    1. Urethral stricture -UA unremarkable  2. Suprapubic pain -She has likely passed a stone during the night as she is asymptomatic this morning -Advised her to message me if she should have a return of the symptoms and then we will pursue a CT renal stone study for further evaluation  Return for as scheduled .  Cloretta Ned   [facility] [address] [place], [place] [address] [phone]

## 2023-05-23 ENCOUNTER — Encounter: Payer: Self-pay | Admitting: Urology

## 2023-05-23 ENCOUNTER — Ambulatory Visit
Admission: RE | Admit: 2023-05-23 | Discharge: 2023-05-23 | Disposition: A | Discharge status: Home / Self Care | Payer: 59 | Source: Ambulatory Visit | Attending: Urology | Admitting: Urology

## 2023-05-23 ENCOUNTER — Ambulatory Visit (INDEPENDENT_AMBULATORY_CARE_PROVIDER_SITE_OTHER): Payer: 59 | Admitting: Urology

## 2023-05-23 ENCOUNTER — Ambulatory Visit
Admission: RE | Admit: 2023-05-23 | Discharge: 2023-05-23 | Disposition: A | Discharge status: Home / Self Care | Payer: 59 | Attending: Urology | Admitting: Urology

## 2023-05-23 DIAGNOSIS — R109 Unspecified abdominal pain: Secondary | ICD-10-CM | POA: Insufficient documentation

## 2023-05-23 DIAGNOSIS — I878 Other specified disorders of veins: Secondary | ICD-10-CM | POA: Diagnosis not present

## 2023-05-23 DIAGNOSIS — R3989 Other symptoms and signs involving the genitourinary system: Secondary | ICD-10-CM

## 2023-05-23 DIAGNOSIS — R102 Pelvic and perineal pain: Secondary | ICD-10-CM | POA: Diagnosis not present

## 2023-05-25 LAB — CULTURE, URINE COMPREHENSIVE

## 2023-05-27 ENCOUNTER — Other Ambulatory Visit: Payer: Self-pay

## 2023-05-27 ENCOUNTER — Other Ambulatory Visit: Payer: Self-pay | Admitting: Urology

## 2023-05-27 MED ORDER — FLUCONAZOLE 150 MG PO TABS
150.0000 mg | ORAL_TABLET | Freq: Once | ORAL | 0 refills | Status: AC
  Filled 2023-05-27: qty 1, 1d supply, fill #0

## 2023-05-27 MED ORDER — CEFUROXIME AXETIL 500 MG PO TABS
500.0000 mg | ORAL_TABLET | Freq: Two times a day (BID) | ORAL | 0 refills | Status: DC
  Filled 2023-05-27: qty 14, 7d supply, fill #0

## 2023-05-30 ENCOUNTER — Other Ambulatory Visit: Payer: Self-pay | Admitting: Family Medicine

## 2023-05-30 ENCOUNTER — Other Ambulatory Visit: Payer: Self-pay

## 2023-05-30 MED FILL — Diclofenac Sodium Gel 1% (1.16% Diethylamine Equiv): CUTANEOUS | 37 days supply | Qty: 300 | Fill #0 | Status: CN

## 2023-05-30 MED FILL — Diclofenac Sodium Gel 1% (1.16% Diethylamine Equiv): CUTANEOUS | 37 days supply | Qty: 300 | Fill #0 | Status: AC

## 2023-05-30 NOTE — Telephone Encounter (Signed)
Requested Prescriptions  Pending Prescriptions Disp Refills   diclofenac Sodium (VOLTAREN) 1 % GEL [Pharmacy Med Name: diclofenac Sodium (VOLTAREN) 1 % Gel] 300 g 0    Sig: Apply 2 g topically 4 (four) times daily.     Analgesics:  Topicals Failed - 05/30/2023 11:11 AM      Failed - Manual Review: Labs are only required if the patient has taken medication for more than 8 weeks.      Passed - PLT in normal range and within 360 days    Platelets  Date Value Ref Range Status  12/24/2022 261 150 - 450 x10E3/uL Final         Passed - HGB in normal range and within 360 days    Hemoglobin  Date Value Ref Range Status  12/24/2022 12.5 11.1 - 15.9 g/dL Final         Passed - HCT in normal range and within 360 days    Hematocrit  Date Value Ref Range Status  12/24/2022 39.0 34.0 - 46.6 % Final         Passed - Cr in normal range and within 360 days    Creatinine  Date Value Ref Range Status  04/15/2012 0.79 0.60 - 1.30 mg/dL Final   Creatinine, Ser  Date Value Ref Range Status  12/24/2022 0.73 0.57 - 1.00 mg/dL Final   Creatinine,U  Date Value Ref Range Status  06/01/2015 93.7 mg/dL Final         Passed - eGFR is 30 or above and within 360 days    EGFR (African American)  Date Value Ref Range Status  04/15/2012 >60  Final   GFR calc Af Amer  Date Value Ref Range Status  08/05/2020 104 >59 mL/min/1.73 Final    Comment:    **In accordance with recommendations from the NKF-ASN Task force,**   [facility] is in the process of updating its eGFR calculation to the   2021 CKD-EPI creatinine equation that estimates kidney function   without a race variable.    EGFR (Non-African Amer.)  Date Value Ref Range Status  04/15/2012 >60  Final    Comment:    eGFR values <70mL/min/1.73 m2 may be an indication of chronic kidney disease (CKD). Calculated eGFR is useful in patients with stable renal function. The eGFR calculation will not be reliable in acutely ill patients when serum  creatinine is changing rapidly. It is not useful in  patients on dialysis. The eGFR calculation may not be applicable to patients at the low and high extremes of body sizes, pregnant women, and vegetarians.    GFR calc non Af Amer  Date Value Ref Range Status  08/05/2020 90 >59 mL/min/1.73 Final   GFR  Date Value Ref Range Status  06/01/2015 82.19 >60.00 mL/min Final   eGFR  Date Value Ref Range Status  12/24/2022 98 >59 mL/min/1.73 Final         Passed - Patient is not pregnant      Passed - Valid encounter within last 12 months    Recent Outpatient Visits           1 month ago Sinus congestion   [facility] [place], Sherran Needs, NP   4 months ago Cellulitis of right upper extremity   [facility] [place], Megan P, DO   5 months ago Routine general medical examination at a health care facility   [facility], Megan P, DO  7 months ago Laryngitis   [facility] [place], [place], DO   10 months ago COVID-19   [facility] Larae Grooms, NP       Future Appointments             In 4 weeks Laural Benes, Oralia Rud, DO [facility]   In 2 months McG[place], Elana Alm [facility] [place]   In 10 months Deirdre Evener, MD [facility]

## 2023-06-19 ENCOUNTER — Ambulatory Visit
Admission: RE | Admit: 2023-06-19 | Discharge: 2023-06-19 | Disposition: A | Discharge status: Home / Self Care | Payer: Commercial Managed Care - PPO | Source: Ambulatory Visit | Attending: Family Medicine | Admitting: Family Medicine

## 2023-06-19 DIAGNOSIS — Z1231 Encounter for screening mammogram for malignant neoplasm of breast: Secondary | ICD-10-CM | POA: Diagnosis not present

## 2023-06-25 ENCOUNTER — Other Ambulatory Visit: Payer: Self-pay

## 2023-06-27 ENCOUNTER — Ambulatory Visit: Payer: Commercial Managed Care - PPO | Admitting: Family Medicine

## 2023-06-27 ENCOUNTER — Other Ambulatory Visit: Payer: Self-pay

## 2023-06-27 ENCOUNTER — Encounter: Payer: Self-pay | Admitting: Family Medicine

## 2023-06-27 VITALS — BP 122/77 | HR 71 | Temp 97.7°F | Wt 301.6 lb

## 2023-06-27 DIAGNOSIS — I10 Essential (primary) hypertension: Secondary | ICD-10-CM | POA: Diagnosis not present

## 2023-06-27 DIAGNOSIS — E782 Mixed hyperlipidemia: Secondary | ICD-10-CM

## 2023-06-27 DIAGNOSIS — L409 Psoriasis, unspecified: Secondary | ICD-10-CM | POA: Insufficient documentation

## 2023-06-27 DIAGNOSIS — M2550 Pain in unspecified joint: Secondary | ICD-10-CM

## 2023-06-27 MED ORDER — EZETIMIBE 10 MG PO TABS
10.0000 mg | ORAL_TABLET | Freq: Every day | ORAL | 1 refills | Status: DC
  Filled 2023-06-27 – 2023-07-07 (×2): qty 90, 90d supply, fill #0
  Filled 2023-10-06: qty 90, 90d supply, fill #1

## 2023-06-27 MED ORDER — DICLOFENAC SODIUM 1 % EX GEL
2.0000 g | Freq: Four times a day (QID) | CUTANEOUS | 6 refills | Status: DC
  Filled 2023-06-27 – 2023-09-03 (×2): qty 300, 37d supply, fill #0
  Filled 2023-11-25: qty 300, 37d supply, fill #1
  Filled 2024-01-27: qty 300, 37d supply, fill #2
  Filled 2024-04-24: qty 300, 37d supply, fill #3

## 2023-06-27 MED ORDER — HYDROCHLOROTHIAZIDE 25 MG PO TABS
25.0000 mg | ORAL_TABLET | Freq: Every day | ORAL | 1 refills | Status: DC
  Filled 2023-06-27 – 2023-07-07 (×2): qty 90, 90d supply, fill #0
  Filled 2023-10-06: qty 90, 90d supply, fill #1

## 2023-06-27 MED ORDER — PANTOPRAZOLE SODIUM 40 MG PO TBEC
80.0000 mg | DELAYED_RELEASE_TABLET | Freq: Every day | ORAL | 1 refills | Status: DC
  Filled 2023-06-27 – 2023-07-28 (×2): qty 180, 90d supply, fill #0
  Filled 2023-10-27: qty 180, 90d supply, fill #1

## 2023-06-27 NOTE — Assessment & Plan Note (Signed)
Stable. Having significant joint pain. Referral to rheumatology placed today.

## 2023-06-27 NOTE — Assessment & Plan Note (Signed)
Under good control on current regimen. Continue current regimen. Continue to monitor. Call with any concerns. Refills given. Labs drawn today.   

## 2023-06-27 NOTE — Assessment & Plan Note (Signed)
Encouraged diet and exercise with goal of losing 1-2lbs per week. Call with any concerns. Continue to monitor.

## 2023-06-27 NOTE — Progress Notes (Signed)
BP 122/77   Pulse 71   Temp 97.7 F (36.5 C)   Wt (!) 301 lb 9.6 oz (136.8 kg)   SpO2 97%   BMI 50.25 kg/m    Subjective:    Patient ID: Latoya Lutz, female    DOB: 04/13/1971, 53 y.o.   MRN: [id number]  HPI: Latoya Lutz is a 53 y.o. female  Chief Complaint  Patient presents with   Hypertension   Hyperlipidemia   Psoriatric Arthritis    Patient says she was informed by [facility], she has a form of Arthritis and recommends she see an Rheumatologist.    HYPERTENSION / HYPERLIPIDEMIA Satisfied with current treatment? yes Duration of hypertension: chronic BP monitoring frequency: not checking BP medication side effects: no Past BP meds: HCTZ Duration of hyperlipidemia: chronic Cholesterol medication side effects: no Cholesterol supplements: none Past cholesterol medications: zetia, pravastatin Medication compliance: excellent compliance Aspirin: no Recent stressors: no Recurrent headaches: no Visual changes: no Palpitations: no Dyspnea: no Chest pain: no Lower extremity edema: no Dizzy/lightheaded: no  Relevant past medical, surgical, family and social history reviewed and updated as indicated. Interim medical history since our last visit reviewed. Allergies and medications reviewed and updated.  Review of Systems  Constitutional: Negative.   Respiratory: Negative.    Cardiovascular: Negative.   Gastrointestinal: Negative.   Musculoskeletal:  Positive for arthralgias, joint swelling and myalgias. Negative for back pain, gait problem, neck pain and neck stiffness.  Skin: Negative.   Psychiatric/Behavioral: Negative.      Per HPI unless specifically indicated above     Objective:    BP 122/77   Pulse 71   Temp 97.7 F (36.5 C)   Wt (!) 301 lb 9.6 oz (136.8 kg)   SpO2 97%   BMI 50.25 kg/m   Wt Readings from Last 3 Encounters:  06/27/23 (!) 301 lb 9.6 oz (136.8 kg)  04/12/23 294 lb 9.6 oz (133.6 kg)  01/07/23 293 lb 9.6 oz (133.2  kg)    Physical Exam Vitals and nursing note reviewed.  Constitutional:      General: She is not in acute distress.    Appearance: Normal appearance. She is obese. She is not ill-appearing, toxic-appearing or diaphoretic.  HENT:     Head: Normocephalic and atraumatic.     Right Ear: External ear normal.     Left Ear: External ear normal.     Nose: Nose normal.     Mouth/Throat:     Mouth: Mucous membranes are moist.     Pharynx: Oropharynx is clear.  Eyes:     General: No scleral icterus.       Right eye: No discharge.        Left eye: No discharge.     Extraocular Movements: Extraocular movements intact.     Conjunctiva/sclera: Conjunctivae normal.     Pupils: Pupils are equal, round, and reactive to light.  Cardiovascular:     Rate and Rhythm: Normal rate and regular rhythm.     Pulses: Normal pulses.     Heart sounds: Normal heart sounds. No murmur heard.    No friction rub. No gallop.  Pulmonary:     Effort: Pulmonary effort is normal. No respiratory distress.     Breath sounds: Normal breath sounds. No stridor. No wheezing, rhonchi or rales.  Chest:     Chest wall: No tenderness.  Musculoskeletal:        General: Normal range of motion.     Cervical  back: Normal range of motion and neck supple.  Skin:    General: Skin is warm and dry.     Capillary Refill: Capillary refill takes less than 2 seconds.     Coloration: Skin is not jaundiced or pale.     Findings: No bruising, erythema, lesion or rash.  Neurological:     General: No focal deficit present.     Mental Status: She is alert and oriented to person, place, and time. Mental status is at baseline.  Psychiatric:        Mood and Affect: Mood normal.        Behavior: Behavior normal.        Thought Content: Thought content normal.        Judgment: Judgment normal.     Results for orders placed or performed in visit on 05/22/23  CULTURE, URINE COMPREHENSIVE   Collection Time: 05/22/23  2:13 PM   Specimen:  Urine   UR  Result Value Ref Range   Urine Culture, Comprehensive Final report (A)    Organism ID, Bacteria Comment (A)    Organism ID, Bacteria Comment   Microscopic Examination   Collection Time: 05/22/23  2:13 PM   Urine  Result Value Ref Range   WBC, UA 0-5 0 - 5 /hpf   RBC, Urine 0-2 0 - 2 /hpf   Epithelial Cells (non renal) 0-10 0 - 10 /hpf   Bacteria, UA Few None seen/Few  Urinalysis, Complete   Collection Time: 05/22/23  2:13 PM  Result Value Ref Range   Specific Gravity, UA 1.010 1.005 - 1.030   pH, UA 6.0 5.0 - 7.5   Color, UA Yellow Yellow   Appearance Ur Clear Clear   Leukocytes,UA Negative Negative   Protein,UA Negative Negative/Trace   Glucose, UA Negative Negative   Ketones, UA Negative Negative   RBC, UA Negative Negative   Bilirubin, UA Negative Negative   Urobilinogen, Ur 0.2 0.2 - 1.0 mg/dL   Nitrite, UA Negative Negative   Microscopic Examination See below:       Assessment & Plan:   Problem List Items Addressed This Visit       Cardiovascular and Mediastinum   Essential hypertension   Under good control on current regimen. Continue current regimen. Continue to monitor. Call with any concerns. Refills given. Labs drawn today.       Relevant Medications   ezetimibe (ZETIA) 10 MG tablet   hydrochlorothiazide (HYDRODIURIL) 25 MG tablet   Other Relevant Orders   CBC with Differential/Platelet   Comprehensive metabolic panel     Musculoskeletal and Integument   Psoriasis   Stable. Having significant joint pain. Referral to rheumatology placed today.       Relevant Orders   Ambulatory referral to Rheumatology     Other   Morbid obesity (HCC)   Encouraged diet and exercise with goal of losing 1-2lbs per week. Call with any concerns. Continue to monitor.       Hyperlipidemia - Primary   Under good control on current regimen. Continue current regimen. Continue to monitor. Call with any concerns. Refills given. Labs drawn today.         Relevant Medications   ezetimibe (ZETIA) 10 MG tablet   hydrochlorothiazide (HYDRODIURIL) 25 MG tablet   Other Relevant Orders   CBC with Differential/Platelet   Comprehensive metabolic panel   Lipid Panel w/o Chol/HDL Ratio   Other Visit Diagnoses       Arthralgia, unspecified joint  Having significant joint pain. Known psoriasis. Referral to rheumatology placed today.   Relevant Orders   Ambulatory referral to Rheumatology        Follow up plan: Return in about 6 months (around 12/25/2023) for physical.

## 2023-06-28 LAB — CBC WITH DIFFERENTIAL/PLATELET
Basophils Absolute: 0 10*3/uL (ref 0.0–0.2)
Basos: 0 %
EOS (ABSOLUTE): 0.1 10*3/uL (ref 0.0–0.4)
Eos: 2 %
Hematocrit: 36.9 % (ref 34.0–46.6)
Hemoglobin: 11.8 g/dL (ref 11.1–15.9)
Immature Grans (Abs): 0 10*3/uL (ref 0.0–0.1)
Immature Granulocytes: 0 %
Lymphocytes Absolute: 2.5 10*3/uL (ref 0.7–3.1)
Lymphs: 34 %
MCH: 27 pg (ref 26.6–33.0)
MCHC: 32 g/dL (ref 31.5–35.7)
MCV: 84 fL (ref 79–97)
Monocytes Absolute: 0.5 10*3/uL (ref 0.1–0.9)
Monocytes: 7 %
Neutrophils Absolute: 4.3 10*3/uL (ref 1.4–7.0)
Neutrophils: 57 %
Platelets: 230 10*3/uL (ref 150–450)
RBC: 4.37 x10E6/uL (ref 3.77–5.28)
RDW: 13.7 % (ref 11.7–15.4)
WBC: 7.5 10*3/uL (ref 3.4–10.8)

## 2023-06-28 LAB — COMPREHENSIVE METABOLIC PANEL
ALT: 12 [IU]/L (ref 0–32)
AST: 14 [IU]/L (ref 0–40)
Albumin: 4.1 g/dL (ref 3.8–4.9)
Alkaline Phosphatase: 104 [IU]/L (ref 44–121)
BUN/Creatinine Ratio: 22 (ref 9–23)
BUN: 16 mg/dL (ref 6–24)
Bilirubin Total: 0.2 mg/dL (ref 0.0–1.2)
CO2: 26 mmol/L (ref 20–29)
Calcium: 9.1 mg/dL (ref 8.7–10.2)
Chloride: 102 mmol/L (ref 96–106)
Creatinine, Ser: 0.73 mg/dL (ref 0.57–1.00)
Globulin, Total: 2.2 g/dL (ref 1.5–4.5)
Glucose: 97 mg/dL (ref 70–99)
Potassium: 4.2 mmol/L (ref 3.5–5.2)
Sodium: 143 mmol/L (ref 134–144)
Total Protein: 6.3 g/dL (ref 6.0–8.5)
eGFR: 98 mL/min/{1.73_m2} (ref >59)

## 2023-06-28 LAB — LIPID PANEL W/O CHOL/HDL RATIO
Cholesterol, Total: 177 mg/dL (ref 100–199)
HDL: 53 mg/dL (ref >39)
LDL Chol Calc (NIH): 95 mg/dL (ref 0–99)
Triglycerides: 171 mg/dL — ABNORMAL HIGH (ref 0–149)
VLDL Cholesterol Cal: 29 mg/dL (ref 5–40)

## 2023-06-30 ENCOUNTER — Encounter: Payer: Self-pay | Admitting: Family Medicine

## 2023-07-07 ENCOUNTER — Other Ambulatory Visit: Payer: Self-pay

## 2023-07-07 MED FILL — Meloxicam Tab 15 MG: ORAL | 90 days supply | Qty: 90 | Fill #1 | Status: AC

## 2023-07-08 ENCOUNTER — Telehealth: Payer: Self-pay | Admitting: Urology

## 2023-07-08 ENCOUNTER — Other Ambulatory Visit
Admission: RE | Admit: 2023-07-08 | Discharge: 2023-07-08 | Disposition: A | Discharge status: Home / Self Care | Payer: Commercial Managed Care - PPO | Attending: Urology | Admitting: Urology

## 2023-07-08 ENCOUNTER — Other Ambulatory Visit: Payer: Self-pay | Admitting: Urology

## 2023-07-08 ENCOUNTER — Other Ambulatory Visit: Payer: Self-pay

## 2023-07-08 DIAGNOSIS — R300 Dysuria: Secondary | ICD-10-CM | POA: Insufficient documentation

## 2023-07-08 DIAGNOSIS — N200 Calculus of kidney: Secondary | ICD-10-CM

## 2023-07-08 LAB — URINALYSIS, COMPLETE (UACMP) WITH MICROSCOPIC
Bacteria, UA: NONE SEEN
Bilirubin Urine: NEGATIVE
Glucose, UA: NEGATIVE mg/dL
Hgb urine dipstick: NEGATIVE
Ketones, ur: NEGATIVE mg/dL
Leukocytes,Ua: NEGATIVE
Nitrite: NEGATIVE
Protein, ur: NEGATIVE mg/dL
Specific Gravity, Urine: 1.014 (ref 1.005–1.030)
pH: 5 (ref 5.0–8.0)

## 2023-07-08 MED ORDER — AMOXICILLIN-POT CLAVULANATE 875-125 MG PO TABS
1.0000 | ORAL_TABLET | Freq: Two times a day (BID) | ORAL | 0 refills | Status: DC
  Filled 2023-07-08: qty 14, 7d supply, fill #0

## 2023-07-08 MED ORDER — FLUCONAZOLE 150 MG PO TABS
150.0000 mg | ORAL_TABLET | Freq: Once | ORAL | 0 refills | Status: AC
  Filled 2023-07-08: qty 1, 1d supply, fill #0

## 2023-07-08 NOTE — Telephone Encounter (Signed)
Latoya Lutz.  I am having those same symptoms again.  I am have frequency to void, pressure and some burning this time.  I am not sure why this continues to happen.  I have been taking AZO for the past 2 days it helps but comes right back.  Can you call me another Rx?  Thank you  I contacted the patient and ordered a urine and urine culture as well as a CT scan

## 2023-07-09 ENCOUNTER — Other Ambulatory Visit: Payer: Self-pay | Admitting: Urology

## 2023-07-09 ENCOUNTER — Other Ambulatory Visit
Admission: RE | Admit: 2023-07-09 | Discharge: 2023-07-09 | Disposition: A | Discharge status: Home / Self Care | Payer: Commercial Managed Care - PPO | Attending: Urology | Admitting: Urology

## 2023-07-09 ENCOUNTER — Other Ambulatory Visit: Payer: Self-pay

## 2023-07-09 DIAGNOSIS — R300 Dysuria: Secondary | ICD-10-CM | POA: Diagnosis not present

## 2023-07-09 LAB — URINE CULTURE

## 2023-07-09 LAB — URINALYSIS, COMPLETE (UACMP) WITH MICROSCOPIC
Bacteria, UA: NONE SEEN
Bilirubin Urine: NEGATIVE
Glucose, UA: NEGATIVE mg/dL
Hgb urine dipstick: NEGATIVE
Ketones, ur: NEGATIVE mg/dL
Leukocytes,Ua: NEGATIVE
Nitrite: POSITIVE — AB
Protein, ur: NEGATIVE mg/dL
RBC / HPF: 0 RBC/hpf (ref 0–5)
Specific Gravity, Urine: 1.01 (ref 1.005–1.030)
pH: 6 (ref 5.0–8.0)

## 2023-07-10 ENCOUNTER — Ambulatory Visit
Admission: RE | Admit: 2023-07-10 | Discharge: 2023-07-10 | Disposition: A | Discharge status: Home / Self Care | Payer: Commercial Managed Care - PPO | Source: Ambulatory Visit | Attending: Urology | Admitting: Urology

## 2023-07-10 DIAGNOSIS — R3129 Other microscopic hematuria: Secondary | ICD-10-CM | POA: Diagnosis not present

## 2023-07-10 DIAGNOSIS — N200 Calculus of kidney: Secondary | ICD-10-CM | POA: Diagnosis not present

## 2023-07-10 DIAGNOSIS — K439 Ventral hernia without obstruction or gangrene: Secondary | ICD-10-CM | POA: Diagnosis not present

## 2023-07-10 DIAGNOSIS — K5730 Diverticulosis of large intestine without perforation or abscess without bleeding: Secondary | ICD-10-CM | POA: Diagnosis not present

## 2023-07-10 LAB — URINE CULTURE: Culture: 20000 — AB

## 2023-07-17 NOTE — Therapy (Signed)
 OUTPATIENT PHYSICAL THERAPY LOWER EXTREMITY EVALUATION   Patient Name: Latoya Lutz MRN: [id number] DOB:March 28, 1971, 53 y.o., female Today's Date: 07/18/2023  END OF SESSION:  PT End of Session - 07/18/23 1458     Visit Number 1    Number of Visits 16    Date for PT Re-Evaluation 09/12/23    Progress Note Due on Visit 10    PT Start Time 1402    PT Stop Time 1444    PT Time Calculation (min) 42 min    Equipment Utilized During Treatment Gait belt    Activity Tolerance Patient tolerated treatment well    Behavior During Therapy WFL for tasks assessed/performed             Past Medical History:  Diagnosis Date   Acid reflux    Actinic keratosis    Allergy    Arthritis    Atypical mole 10/27/2019   Left labia mucosa    Atypical nevus 05/25/2020   R labia majora - atypical proliferation   Candidal dermatitis 05/25/2014   Cervical spine disease    Chicken pox    Chronic urethral narrowing    undergoing stretching   Complication of anesthesia    Dysplastic nevus 04/05/2021   R post med thigh near popliteal, mod to severe atypia   Endometriosis    Dr. Ruthell at [facility], removed    Esophageal reflux 05/25/2014   Gross hematuria 04/25/2015   H/O cystoscopy    normal   Heart murmur    Hx of basal cell carcinoma 12/29/2008   L low back   Hypertension    Infection of urinary tract 09/11/2014   Kidney stone 09/11/2014   Kidney stones    Microscopic hematuria 09/11/2014   Obesity, morbid, BMI 40.0-49.9 (HCC) 04/10/2012   PONV (postoperative nausea and vomiting)    Right ureteral stone 04/28/2015   Skin cancer    Urinary retention    Past Surgical History:  Procedure Laterality Date   ABDOMINAL HYSTERECTOMY     BLADDER SURGERY  07/2003   CARPAL TUNNEL RELEASE Right    CESAREAN SECTION  1998   CHOLECYSTECTOMY     COLONOSCOPY WITH PROPOFOL  N/A 08/24/2021   Procedure: COLONOSCOPY WITH PROPOFOL ;  Surgeon: Jinny Carmine, MD;  Location: [facility] ENDOSCOPY;  Service:  Endoscopy;  Laterality: N/A;  9 AM ARRIVAL, PLEASE   CYSTOSCOPY W/ RETROGRADES Right 05/16/2015   Procedure: CYSTOSCOPY WITH RETROGRADE PYELOGRAM;  Surgeon: Redell Lynwood Napoleon, MD;  Location: [facility] ORS;  Service: Urology;  Laterality: Right;   CYSTOSCOPY W/ URETERAL STENT PLACEMENT Right 05/16/2015   Procedure: CYSTOSCOPY WITH STENT REPLACEMENT;  Surgeon: Redell Lynwood Napoleon, MD;  Location: [facility] ORS;  Service: Urology;  Laterality: Right;   CYSTOSCOPY WITH STENT PLACEMENT Right 04/29/2015   Procedure: CYSTOSCOPY WITH STENT PLACEMENT;  Surgeon: Redell Lynwood Napoleon, MD;  Location: [facility] ORS;  Service: Urology;  Laterality: Right;   ESOPHAGOGASTRODUODENOSCOPY N/A 08/24/2021   Procedure: ESOPHAGOGASTRODUODENOSCOPY (EGD);  Surgeon: Jinny Carmine, MD;  Location: [facility] ENDOSCOPY;  Service: Endoscopy;  Laterality: N/A;   EXTRACORPOREAL SHOCK WAVE LITHOTRIPSY Left 06/02/2015   Procedure: EXTRACORPOREAL SHOCK WAVE LITHOTRIPSY (ESWL);  Surgeon: Rosina Riis, MD;  Location: [facility] ORS;  Service: Urology;  Laterality: Left;   FOOT SURGERY  2003   KNEE SURGERY  2005   TONSILLECTOMY     TONSILLECTOMY     lingual growth on vocal cord removal   TOTAL ABDOMINAL HYSTERECTOMY W/ BILATERAL SALPINGOOPHORECTOMY     [facility] complete   URETEROSCOPY  WITH HOLMIUM LASER LITHOTRIPSY Right 05/16/2015   Procedure: URETEROSCOPY WITH HOLMIUM LASER LITHOTRIPSY;  Surgeon: Redell Lynwood Napoleon, MD;  Location: [facility] ORS;  Service: Urology;  Laterality: Right;   Patient Active Problem List   Diagnosis Date Noted   Psoriasis 06/27/2023   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   Essential hypertension 09/13/2019   Acute pain of left knee 07/10/2018   Acute pain of right shoulder 07/10/2018   Hyperlipidemia 10/19/2016   Murmur 07/23/2016   Acute anxiety 03/05/2016   Dysphagia 11/14/2015   History of urinary stone 10/29/2015   History of urethral narrowing 07/16/2015   Esophageal reflux 05/25/2014   Morbid obesity  (HCC) 04/10/2012    PCP: [name], DO   REFERRING PROVIDER:   Marchia Drivers, MD    REFERRING DIAG: [phone] (ICD-10-CM) - Pain in left knee   THERAPY DIAG:  Abnormality of gait and mobility  Muscle weakness (generalized)  Chronic pain of left knee  Rationale for Evaluation and Treatment: Rehabilitation  ONSET DATE: 01/29/22  SUBJECTIVE:   SUBJECTIVE STATEMENT:  Pt had initial injury in 2007 where her patella dislocated. Pt eventually had to have knee surgery. Pt was good for a while until she recently had to get corticosteroid injections. Most recent Corticosteroid injection in 05/22/23.Pt now would like to proceed with gel injections for the knee but wants to try PT first as well. Potential diagnosis of psoriatic arthritis but awaiting to see specialist.  Patient initially had good success with corticosteroid injections but most recently they have been much less effective.  Patient reports she usually does not wear knee brace but does wear the brace when her pain flares up.  PERTINENT HISTORY: History of chronic pain in her left knee, arthritis, hypertension, obesity PAIN:  Are you having pain? Yes: NPRS scale: 8 Pain location: left knee  Pain description: sharp  Aggravating factors: Walking Relieving factors: rest, injections.   PRECAUTIONS: None  RED FLAGS: None   WEIGHT BEARING RESTRICTIONS: No  FALLS:  Has patient fallen in last 6 months? No  LIVING ENVIRONMENT: Lives with: lives with their family and lives with their spouse Lives in: House/apartment Stairs: Yes: Internal: 6 steps; on left going up and External: 3 steps; on left going up Has following equipment at home: None  OCCUPATION: Mostly seated work, a lot of standing at home   PLOF: Independent  PATIENT GOALS: Improve knee function and pain   NEXT MD VISIT: No scheduled appointment, waiting to see if gel injections are authorized   OBJECTIVE:  Note: Objective measures were completed  at Evaluation unless otherwise noted.  DIAGNOSTIC FINDINGS: From MRI 07/18/2018:  1. High-grade partial thickness radial tear at the meniscal root region of the medial meniscus with partial detachment and medial protrusion of the dysfunctional meniscus. 2. Intact ligamentous structures and no acute bony findings. 3. Significant tricompartmental degenerative changes as detailed above. Associated stress reaction involving the medial tibial plateau without definite stress fracture. 4. Small joint effusion and mild synovitis. Very small Baker's cyst. PATIENT SURVEYS:  LEFS 40/80  COGNITION: Overall cognitive status: Within functional limits for tasks assessed        MUSCLE LENGTH: Hamstrings: Right WNL with taking knee joint limitation into account     POSTURE: weight shift right  PALPATION: Tenderness to palpation along left IT band, left gastrocnemius, and left inferior joint line.  LOWER EXTREMITY ROM:  Active ROM Right eval Left eval  Hip flexion    Hip extension  Hip abduction    Hip adduction    Hip internal rotation    Hip external rotation    Knee flexion 120 85  Knee extension 0 20 from full ext   Ankle dorsiflexion    Ankle plantarflexion    Ankle inversion    Ankle eversion     (Blank rows = not tested)  LOWER EXTREMITY MMT:  MMT Right eval Left eval  Hip flexion    Hip extension    Hip abduction    Hip adduction    Hip internal rotation    Hip external rotation    Knee flexion    Knee extension    Ankle dorsiflexion    Ankle plantarflexion    Ankle inversion    Ankle eversion     (Blank rows = not tested)   FUNCTIONAL TESTS:  5 times sit to stand: 12.19 sec 6 minute walk test: test visit 2 10 meter walk test: 12.4 sec   GAIT: Distance walked: 40 ft Assistive device utilized: None Level of assistance: Complete Independence Comments: Don joy knee brace donned, increased time on R LE> LLE, knee flexion throughout gait cycle                                                                                                                                 TREATMENT DATE:  Patient instructed in plan of care, findings for evaluation, and ways of physical therapy may improve her function and quality of life.   PATIENT EDUCATION:  Education details: POC Person educated: Patient Education method: Explanation Education comprehension: verbalized understanding   HOME EXERCISE PROGRAM: Establish visit 2    ASSESSMENT:  CLINICAL IMPRESSION: Patient is a 53 year old female who presents to physical therapy with complaints of chronic knee pain on her left lower extremity.  Patient presents with deficits in muscular strength on her left side as well as deficits in left knee range of motion.  In addition patient's lower extremity functional scale indicate significant limitation as a result of her knee pain and discomfort at this time.  Patient has a long history of getting injections in her left knee which had previously been very effective for management of her symptoms but have recently become less effective and are not lasting long enough to make significant difference at this time.  Patient will benefit from skilled physical therapy to improve her lower extremity strength, range of motion, endurance, and quality of life.  OBJECTIVE IMPAIRMENTS: Abnormal gait, decreased activity tolerance, difficulty walking, decreased ROM, and decreased strength.   ACTIVITY LIMITATIONS: standing, squatting, stairs, and locomotion level  PARTICIPATION LIMITATIONS: meal prep, cleaning, laundry, shopping, community activity, occupation, and yard work  PERSONAL FACTORS: Time since onset of injury/illness/exacerbation and 1-2 comorbidities: hypertension, obesity  are also affecting patient's functional outcome.   REHAB POTENTIAL: Good  CLINICAL DECISION MAKING: Evolving/moderate complexity  EVALUATION COMPLEXITY: Moderate   GOALS: Goals reviewed  with patient? Yes  SHORT TERM GOALS: Target date: 08/15/2023     Patient will be independent in home exercise program to improve strength/mobility for better functional independence with ADLs. Baseline: No HEP currently  Goal status: INITIAL  LONG TERM GOALS: Target date: 09/12/2023    Patient will improve lower extremity functional scale by 12 points or greater in order to indicate improvement in subjective level of function as a result of her left knee pain Baseline: 40 Goal status: INITIAL  2.  Patient will improve her left knee range of motion by 10 degrees with both flexion and extension in order to allow for improved gait pattern as well as decreased restriction with activities. Baseline: See evaluation chart Goal status: INITIAL  3.  Patient will complete 5 times sit to stand and 10 seconds or less in order to indicate improved lower extremity strength and power Baseline: 12.19 seconds Goal status: INITIAL  4.  Patient will improve 10 m walk test to 1 m/s or faster in order to indicate improved gait speed for community based ambulation and safety Baseline: .82 m/s Goal status: INITIAL  5.  Patient will improve 6-minute walk test by 150 feet or greater in order to indicate improved capacity for community based ambulation Baseline: Test visit 2  Goal status: INITIAL   PLAN:  PT FREQUENCY: 2x/week  PT DURATION: 8 weeks  PLANNED INTERVENTIONS: 97110-Therapeutic exercises, 97530- Therapeutic activity, 97112- Neuromuscular re-education, 97535- Self Care, [id number]- Manual therapy, 97116- Gait training, Balance training, Dry Needling, Joint mobilization, Joint manipulation, Scar mobilization, and Cryotherapy  PLAN FOR NEXT SESSION: 6 MWT, knee ROM and strength, hip strength and knee strength testing    Lonni KATHEE Gainer, PT 07/18/2023, 2:59 PM

## 2023-07-18 ENCOUNTER — Ambulatory Visit: Payer: Commercial Managed Care - PPO | Attending: Orthopedic Surgery | Admitting: Physical Therapy

## 2023-07-18 DIAGNOSIS — M6281 Muscle weakness (generalized): Secondary | ICD-10-CM | POA: Insufficient documentation

## 2023-07-18 DIAGNOSIS — M25562 Pain in left knee: Secondary | ICD-10-CM | POA: Insufficient documentation

## 2023-07-18 DIAGNOSIS — G8929 Other chronic pain: Secondary | ICD-10-CM | POA: Insufficient documentation

## 2023-07-18 DIAGNOSIS — R269 Unspecified abnormalities of gait and mobility: Secondary | ICD-10-CM | POA: Insufficient documentation

## 2023-07-18 DIAGNOSIS — R262 Difficulty in walking, not elsewhere classified: Secondary | ICD-10-CM | POA: Diagnosis not present

## 2023-07-22 ENCOUNTER — Ambulatory Visit: Payer: Commercial Managed Care - PPO | Admitting: Physical Therapy

## 2023-07-22 DIAGNOSIS — M6281 Muscle weakness (generalized): Secondary | ICD-10-CM

## 2023-07-22 DIAGNOSIS — G8929 Other chronic pain: Secondary | ICD-10-CM | POA: Diagnosis not present

## 2023-07-22 DIAGNOSIS — R269 Unspecified abnormalities of gait and mobility: Secondary | ICD-10-CM | POA: Diagnosis not present

## 2023-07-22 DIAGNOSIS — R262 Difficulty in walking, not elsewhere classified: Secondary | ICD-10-CM | POA: Diagnosis not present

## 2023-07-22 DIAGNOSIS — M25562 Pain in left knee: Secondary | ICD-10-CM | POA: Diagnosis not present

## 2023-07-22 NOTE — Therapy (Signed)
 OUTPATIENT PHYSICAL THERAPY LOWER EXTREMITY TREATMENT   Patient Name: Latoya Lutz MRN: [id number] DOB:Apr 17, 1971, 53 y.o., female Today's Date: 07/22/2023  END OF SESSION:  PT End of Session - 07/22/23 1717     Visit Number 2    Number of Visits 16    Date for PT Re-Evaluation 09/12/23    Progress Note Due on Visit 10    PT Start Time 1401    PT Stop Time 1440    PT Time Calculation (min) 39 min    Equipment Utilized During Treatment Gait belt    Activity Tolerance Patient tolerated treatment well    Behavior During Therapy WFL for tasks assessed/performed              Past Medical History:  Diagnosis Date   Acid reflux    Actinic keratosis    Allergy    Arthritis    Atypical mole 10/27/2019   Left labia mucosa    Atypical nevus 05/25/2020   R labia majora - atypical proliferation   Candidal dermatitis 05/25/2014   Cervical spine disease    Chicken pox    Chronic urethral narrowing    undergoing stretching   Complication of anesthesia    Dysplastic nevus 04/05/2021   R post med thigh near popliteal, mod to severe atypia   Endometriosis    Dr. Tarri Farm at [facility], removed    Esophageal reflux 05/25/2014   Gross hematuria 04/25/2015   H/O cystoscopy    normal   Heart murmur    Hx of basal cell carcinoma 12/29/2008   L low back   Hypertension    Infection of urinary tract 09/11/2014   Kidney stone 09/11/2014   Kidney stones    Microscopic hematuria 09/11/2014   Obesity, morbid, BMI 40.0-49.9 (HCC) 04/10/2012   PONV (postoperative nausea and vomiting)    Right ureteral stone 04/28/2015   Skin cancer    Urinary retention    Past Surgical History:  Procedure Laterality Date   ABDOMINAL HYSTERECTOMY     BLADDER SURGERY  07/2003   CARPAL TUNNEL RELEASE Right    CESAREAN SECTION  1998   CHOLECYSTECTOMY     COLONOSCOPY WITH PROPOFOL  N/A 08/24/2021   Procedure: COLONOSCOPY WITH PROPOFOL ;  Surgeon: Marnee Sink, MD;  Location: [facility] ENDOSCOPY;  Service:  Endoscopy;  Laterality: N/A;  9 AM ARRIVAL, PLEASE   CYSTOSCOPY W/ RETROGRADES Right 05/16/2015   Procedure: CYSTOSCOPY WITH RETROGRADE PYELOGRAM;  Surgeon: Bart Born, MD;  Location: [facility] ORS;  Service: Urology;  Laterality: Right;   CYSTOSCOPY W/ URETERAL STENT PLACEMENT Right 05/16/2015   Procedure: CYSTOSCOPY WITH STENT REPLACEMENT;  Surgeon: Bart Born, MD;  Location: [facility] OR[facility];  Service: Urology;  Laterality: Right;   CYSTOSCOPY WITH STENT PLACEMENT Right 04/29/2015   Procedure: CYSTOSCOPY WITH STENT PLACEMENT;  Surgeon: Bart Born, MD;  Location: [facility] ORS;  Service: Urology;  Laterality: Right;   ESOPHAGOGASTRODUODENOSCOPY N/A 08/24/2021   Procedure: ESOPHAGOGASTRODUODENOSCOPY (EGD);  Surgeon: Marnee Sink, MD;  Location: [facility] ENDOSCOPY;  Service: Endoscopy;  Laterality: N/A;   EXTRACORPOREAL SHOCK WAVE LITHOTRIPSY Left 06/02/2015   Procedure: EXTRACORPOREAL SHOCK WAVE LITHOTRIPSY (ESWL);  Surgeon: Dustin Gimenez, MD;  Location: [facility] ORS;  Service: Urology;  Laterality: Left;   FOOT SURGERY  2003   KNEE SURGERY  2005   TONSILLECTOMY     TONSILLECTOMY     lingual growth on vocal cord removal   TOTAL ABDOMINAL HYSTERECTOMY W/ BILATERAL SALPINGOOPHORECTOMY     [facility] complete  URETEROSCOPY WITH HOLMIUM LASER LITHOTRIPSY Right 05/16/2015   Procedure: URETEROSCOPY WITH HOLMIUM LASER LITHOTRIPSY;  Surgeon: Bart Born, MD;  Location: [facility] ORS;  Service: Urology;  Laterality: Right;   Patient Active Problem List   Diagnosis Date Noted   Psoriasis 06/27/2023   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   Essential hypertension 09/13/2019   Acute pain of left knee 07/10/2018   Acute pain of right shoulder 07/10/2018   Hyperlipidemia 10/19/2016   Murmur 07/23/2016   Acute anxiety 03/05/2016   Dysphagia 11/14/2015   History of urinary stone 10/29/2015   History of urethral narrowing 07/16/2015   Esophageal reflux 05/25/2014   Morbid obesity  (HCC) 04/10/2012    PCP: Solomon Dupre, DO   REFERRING PROVIDER:   Rande Bushy, MD    REFERRING DIAG: [phone] (ICD-10-CM) - Pain in left knee   THERAPY DIAG:  Abnormality of gait and mobility  Muscle weakness (generalized)  Chronic pain of left knee  Rationale for Evaluation and Treatment: Rehabilitation  ONSET DATE: 01/29/22  SUBJECTIVE:   SUBJECTIVE STATEMENT:  Pt reports knee is about the same as last session. Reports she did have a meniscus tear in 2020 but left that off her history last date.   From initial eval: Pt had initial injury in 2007 where her patella dislocated. Pt eventually had to have knee surgery. Pt was good for a while until she recently had to get corticosteroid injections. Most recent Corticosteroid injection in 05/22/23.Pt now would like to proceed with gel injections for the knee but wants to try PT first as well. Potential diagnosis of psoriatic arthritis but awaiting to see specialist.  Patient initially had good success with corticosteroid injections but most recently they have been much less effective.  Patient reports she usually does not wear knee brace but does wear the brace when her pain flares up.  PERTINENT HISTORY: History of chronic pain in her left knee, arthritis, hypertension, obesity PAIN:  Are you having pain? Yes: NPRS scale: 5 Pain location: left knee  Pain description: sharp  Aggravating factors: Walking Relieving factors: rest, injections.   PRECAUTIONS: None  RED FLAGS: None   WEIGHT BEARING RESTRICTIONS: No  FALLS:  Has patient fallen in last 6 months? No  LIVING ENVIRONMENT: Lives with: lives with their family and lives with their spouse Lives in: House/apartment Stairs: Yes: Internal: 6 steps; on left going up and External: 3 steps; on left going up Has following equipment at home: None  OCCUPATION: Mostly seated work, a lot of standing at home   PLOF: Independent  PATIENT GOALS: Improve knee  function and pain   NEXT MD VISIT: No scheduled appointment, waiting to see if gel injections are authorized   OBJECTIVE:  Note: Objective measures were completed at Evaluation unless otherwise noted.  DIAGNOSTIC FINDINGS: From MRI 07/18/2018:  "1. High-grade partial thickness radial tear at the meniscal root region of the medial meniscus with partial detachment and medial protrusion of the dysfunctional meniscus. 2. Intact ligamentous structures and no acute bony findings. 3. Significant tricompartmental degenerative changes as detailed above. Associated stress reaction involving the medial tibial plateau without definite stress fracture. 4. Small joint effusion and mild synovitis. Very small Baker's cyst." PATIENT SURVEYS:  LEFS 40/80  COGNITION: Overall cognitive status: Within functional limits for tasks assessed        MUSCLE LENGTH: Hamstrings: Right WNL with taking knee joint limitation into account     POSTURE: weight shift right  PALPATION: Tenderness  to palpation along left IT band, left gastrocnemius, and left inferior joint line.  LOWER EXTREMITY ROM:  Active ROM Right eval Left eval  Hip flexion    Hip extension    Hip abduction    Hip adduction    Hip internal rotation    Hip external rotation    Knee flexion 120 85  Knee extension 0 20 from full ext   Ankle dorsiflexion    Ankle plantarflexion    Ankle inversion    Ankle eversion     (Blank rows = not tested)  LOWER EXTREMITY MMT:  MMT Right eval Left eval  Hip flexion    Hip extension    Hip abduction    Hip adduction    Hip internal rotation    Hip external rotation    Knee flexion    Knee extension    Ankle dorsiflexion    Ankle plantarflexion    Ankle inversion    Ankle eversion     (Blank rows = not tested)   FUNCTIONAL TESTS:  5 times sit to stand: 12.19 sec 6 minute walk test: test visit 2 10 meter walk test: 12.4 sec   GAIT: Distance walked: 40 ft Assistive device  utilized: None Level of assistance: Complete Independence Comments: Don joy knee brace donned, increased time on R LE> LLE, knee flexion throughout gait cycle                                                                                                                                TREATMENT DATE:  Nustep   Manual therapy to gastroc and lastus lateralis/ ITB x 8 min, pt reports improved tightness in knee following.   Heel slides 2 x 10 x 5 sec holds   Quad sets 2 x 10 x 3 sec   Glute sets 2 x 10 3 sec hold  SAQ 2 x 10 with 3 sec hold   Heel raises standing 2 x 10   LAQ- 2 x 10 x 3 sec hold      PATIENT EDUCATION:  Education details: POC Person educated: Patient Education method: Explanation Education comprehension: verbalized understanding   HOME EXERCISE PROGRAM:  Access Code: [email or web address] URL: [email or web address] Date: 07/22/2023 Prepared by: Marlynn Singer  Exercises - Seated Long Arc Quad  - 1 x daily - 7 x weekly - 2 sets - 10 reps - 3 sec hold - Supine Heel Slide  - 1 x daily - 7 x weekly - 2 sets - 10 reps - 3 sec hold - Supine Gluteal Sets  - 1 x daily - 7 x weekly - 2 sets - 10 reps - 3 sec hold - Long Sitting Quad Set  - 1 x daily - 7 x weekly - 2 sets - 10 reps - 3 sec hold  ASSESSMENT:  CLINICAL IMPRESSION:  Patient arrived with good motivation for completion of pt activities. Pt begins with initial hip and  knee stabilizing, ROM, and strengthening exercises and HEP provided. Pt will continue to benefit from skilled physical therapy intervention to address impairments, improve QOL, and attain therapy goals.    OBJECTIVE IMPAIRMENTS: Abnormal gait, decreased activity tolerance, difficulty walking, decreased ROM, and decreased strength.   ACTIVITY LIMITATIONS: standing, squatting, stairs, and locomotion level  PARTICIPATION LIMITATIONS: meal prep, cleaning, laundry, shopping, community activity, occupation, and yard work  PERSONAL  FACTORS: Time since onset of injury/illness/exacerbation and 1-2 comorbidities: hypertension, obesity  are also affecting patient's functional outcome.   REHAB POTENTIAL: Good  CLINICAL DECISION MAKING: Evolving/moderate complexity  EVALUATION COMPLEXITY: Moderate   GOALS: Goals reviewed with patient? Yes  SHORT TERM GOALS: Target date: 08/15/2023     Patient will be independent in home exercise program to improve strength/mobility for better functional independence with ADLs. Baseline: No HEP currently  Goal status: INITIAL  LONG TERM GOALS: Target date: 09/12/2023    Patient will improve lower extremity functional scale by 12 points or greater in order to indicate improvement in subjective level of function as a result of her left knee pain Baseline: 40 Goal status: INITIAL  2.  Patient will improve her left knee range of motion by 10 degrees with both flexion and extension in order to allow for improved gait pattern as well as decreased restriction with activities. Baseline: See evaluation chart Goal status: INITIAL  3.  Patient will complete 5 times sit to stand and 10 seconds or less in order to indicate improved lower extremity strength and power Baseline: 12.19 seconds Goal status: INITIAL  4.  Patient will improve 10 m walk test to 1 m/s or faster in order to indicate improved gait speed for community based ambulation and safety Baseline: .82 m/s Goal status: INITIAL  5.  Patient will improve 6-minute walk test by 150 feet or greater in order to indicate improved capacity for community based ambulation Baseline: Test visit 2  Goal status: INITIAL   PLAN:  PT FREQUENCY: 2x/week  PT DURATION: 8 weeks  PLANNED INTERVENTIONS: 97110-Therapeutic exercises, 97530- Therapeutic activity, 97112- Neuromuscular re-education, 97535- Self Care, 54098- Manual therapy, 97116- Gait training, Balance training, Dry Needling, Joint mobilization, Joint manipulation, Scar  mobilization, and Cryotherapy  PLAN FOR NEXT SESSION: 6 MWT, knee ROM and strength, hip strength and knee strength testing    Edwina Gram, PT 07/22/2023, 5:17 PM

## 2023-07-25 ENCOUNTER — Ambulatory Visit: Payer: Commercial Managed Care - PPO

## 2023-07-25 ENCOUNTER — Telehealth: Payer: Self-pay | Admitting: Orthopedic Surgery

## 2023-07-25 DIAGNOSIS — M6281 Muscle weakness (generalized): Secondary | ICD-10-CM

## 2023-07-25 DIAGNOSIS — G8929 Other chronic pain: Secondary | ICD-10-CM

## 2023-07-25 DIAGNOSIS — R262 Difficulty in walking, not elsewhere classified: Secondary | ICD-10-CM

## 2023-07-25 DIAGNOSIS — M25562 Pain in left knee: Secondary | ICD-10-CM | POA: Diagnosis not present

## 2023-07-25 DIAGNOSIS — R269 Unspecified abnormalities of gait and mobility: Secondary | ICD-10-CM | POA: Diagnosis not present

## 2023-07-25 NOTE — Therapy (Unsigned)
OUTPATIENT PHYSICAL THERAPY LOWER EXTREMITY TREATMENT   Patient Name: FRANCA STAKES MRN: [id number] DOB:04-15-1971, 53 y.o., female Today's Date: 07/26/2023  END OF SESSION:  PT End of Session - 07/25/23 1452     Visit Number 3    Number of Visits 16    Date for PT Re-Evaluation 09/12/23    Progress Note Due on Visit 10    PT Start Time 1450    PT Stop Time 1530    PT Time Calculation (min) 40 min    Equipment Utilized During Treatment Gait belt    Activity Tolerance Patient tolerated treatment well;No increased pain    Behavior During Therapy [facility] for tasks assessed/performed              Past Medical History:  Diagnosis Date   Acid reflux    Actinic keratosis    Allergy    Arthritis    Atypical mole 10/27/2019   Left labia mucosa    Atypical nevus 05/25/2020   R labia majora - atypical proliferation   Candidal dermatitis 05/25/2014   Cervical spine disease    Chicken pox    Chronic urethral narrowing    undergoing stretching   Complication of anesthesia    Dysplastic nevus 04/05/2021   R post med thigh near popliteal, mod to severe atypia   Endometriosis    Dr. Gerrie Nordmann at [facility], removed    Esophageal reflux 05/25/2014   Gross hematuria 04/25/2015   H/O cystoscopy    normal   Heart murmur    Hx of basal cell carcinoma 12/29/2008   L low back   Hypertension    Infection of urinary tract 09/11/2014   Kidney stone 09/11/2014   Kidney stones    Microscopic hematuria 09/11/2014   Obesity, morbid, BMI 40.0-49.9 (HCC) 04/10/2012   PONV (postoperative nausea and vomiting)    Right ureteral stone 04/28/2015   Skin cancer    Urinary retention    Past Surgical History:  Procedure Laterality Date   ABDOMINAL HYSTERECTOMY     BLADDER SURGERY  07/2003   CARPAL TUNNEL RELEASE Right    CESAREAN SECTION  1998   CHOLECYSTECTOMY     COLONOSCOPY WITH PROPOFOL N/A 08/24/2021   Procedure: COLONOSCOPY WITH PROPOFOL;  Surgeon: Midge Minium, MD;  Location: [facility]  ENDOSCOPY;  Service: Endoscopy;  Laterality: N/A;  9 AM ARRIVAL, PLEASE   CYSTOSCOPY W/ RETROGRADES Right 05/16/2015   Procedure: CYSTOSCOPY WITH RETROGRADE PYELOGRAM;  Surgeon: Hildred Laser, MD;  Location: [facility] ORS;  Service: Urology;  Laterality: Right;   CYSTOSCOPY W/ URETERAL STENT PLACEMENT Right 05/16/2015   Procedure: CYSTOSCOPY WITH STENT REPLACEMENT;  Surgeon: Hildred Laser, MD;  Location: [facility] OR[facility];  Service: Urology;  Laterality: Right;   CYSTOSCOPY WITH STENT PLACEMENT Right 04/29/2015   Procedure: CYSTOSCOPY WITH STENT PLACEMENT;  Surgeon: Hildred Laser, MD;  Location: [facility] OR[facility];  Service: Urology;  Laterality: Right;   ESOPHAGOGASTRODUODENOSCOPY N/A 08/24/2021   Procedure: ESOPHAGOGASTRODUODENOSCOPY (EGD);  Surgeon: Midge Minium, MD;  Location: [facility] ENDOSCOPY;  Service: Endoscopy;  Laterality: N/A;   EXTRACORPOREAL SHOCK WAVE LITHOTRIPSY Left 06/02/2015   Procedure: EXTRACORPOREAL SHOCK WAVE LITHOTRIPSY (ESWL);  Surgeon: Vanna Scotland, MD;  Location: [facility] ORS;  Service: Urology;  Laterality: Left;   FOOT SURGERY  2003   KNEE SURGERY  2005   TONSILLECTOMY     TONSILLECTOMY     lingual growth on vocal cord removal   TOTAL ABDOMINAL HYSTERECTOMY W/ BILATERAL SALPINGOOPHORECTOMY     [facility] complete  URETEROSCOPY WITH HOLMIUM LASER LITHOTRIPSY Right 05/16/2015   Procedure: URETEROSCOPY WITH HOLMIUM LASER LITHOTRIPSY;  Surgeon: Hildred Laser, MD;  Location: [facility] ORS;  Service: Urology;  Laterality: Right;   Patient Active Problem List   Diagnosis Date Noted   Psoriasis 06/27/2023   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   Essential hypertension 09/13/2019   Acute pain of left knee 07/10/2018   Acute pain of right shoulder 07/10/2018   Hyperlipidemia 10/19/2016   Murmur 07/23/2016   Acute anxiety 03/05/2016   Dysphagia 11/14/2015   History of urinary stone 10/29/2015   History of urethral narrowing 07/16/2015   Esophageal reflux  05/25/2014   Morbid obesity (HCC) 04/10/2012    PCP: Dorcas Carrow, DO   REFERRING PROVIDER:   Juanell Fairly, MD    REFERRING DIAG: [phone] (ICD-10-CM) - Pain in left knee   THERAPY DIAG:  Muscle weakness (generalized)  Chronic pain of left knee  Difficulty in walking, not elsewhere classified  Rationale for Evaluation and Treatment: Rehabilitation  ONSET DATE: 01/29/22  SUBJECTIVE:   SUBJECTIVE STATEMENT:  Pt says meloxicam not touching the pain. Currently has sharp pains. Pt taking Tyelnol also for pain management. Pt reports HEP went well.    PERTINENT HISTORY:  Pt reports knee is about the same as last session. Reports she did have a meniscus tear in 2020 but left that off her history last date.   From initial eval: Pt had initial injury in 2007 where her patella dislocated. Pt eventually had to have knee surgery. Pt was good for a while until she recently had to get corticosteroid injections. Most recent Corticosteroid injection in 05/22/23.Pt now would like to proceed with gel injections for the knee but wants to try PT first as well. Potential diagnosis of psoriatic arthritis but awaiting to see specialist.  Patient initially had good success with corticosteroid injections but most recently they have been much less effective.  Patient reports she usually does not wear knee brace but does wear the brace when her pain flares up. History of chronic pain in her left knee, arthritis, hypertension, obesity PAIN:  Are you having pain? Yes: NPRS scale: 5 Pain location: left knee  Pain description: sharp  Aggravating factors: Walking Relieving factors: rest, injections.   PRECAUTIONS: None  RED FLAGS: None   WEIGHT BEARING RESTRICTIONS: No  FALLS:  Has patient fallen in last 6 months? No  LIVING ENVIRONMENT: Lives with: lives with their family and lives with their spouse Lives in: House/apartment Stairs: Yes: Internal: 6 steps; on left going up and  External: 3 steps; on left going up Has following equipment at home: None  OCCUPATION: Mostly seated work, a lot of standing at home   PLOF: Independent  PATIENT GOALS: Improve knee function and pain   NEXT MD VISIT: No scheduled appointment, waiting to see if gel injections are authorized   OBJECTIVE:  Note: Objective measures were completed at Evaluation unless otherwise noted.  DIAGNOSTIC FINDINGS: From MRI 07/18/2018:  "1. High-grade partial thickness radial tear at the meniscal root region of the medial meniscus with partial detachment and medial protrusion of the dysfunctional meniscus. 2. Intact ligamentous structures and no acute bony findings. 3. Significant tricompartmental degenerative changes as detailed above. Associated stress reaction involving the medial tibial plateau without definite stress fracture. 4. Small joint effusion and mild synovitis. Very small Baker's cyst." PATIENT SURVEYS:  LEFS 40/80  COGNITION: Overall cognitive status: Within functional limits for tasks assessed  MUSCLE LENGTH: Hamstrings: Right WNL with taking knee joint limitation into account     POSTURE: weight shift right  PALPATION: Tenderness to palpation along left IT band, left gastrocnemius, and left inferior joint line.  LOWER EXTREMITY ROM:  Active ROM Right eval Left eval  Hip flexion    Hip extension    Hip abduction    Hip adduction    Hip internal rotation    Hip external rotation    Knee flexion 120 85  Knee extension 0 20 from full ext   Ankle dorsiflexion    Ankle plantarflexion    Ankle inversion    Ankle eversion     (Blank rows = not tested)  LOWER EXTREMITY MMT:  MMT Right eval Left eval  Hip flexion    Hip extension    Hip abduction    Hip adduction    Hip internal rotation    Hip external rotation    Knee flexion    Knee extension    Ankle dorsiflexion    Ankle plantarflexion    Ankle inversion    Ankle eversion     (Blank rows  = not tested)   FUNCTIONAL TESTS:  5 times sit to stand: 12.19 sec 6 minute walk test: test visit 2 10 meter walk test: 12.4 sec   GAIT: Distance walked: 40 ft Assistive device utilized: None Level of assistance: Complete Independence Comments: Don joy knee brace donned, increased time on R LE> LLE, knee flexion throughout gait cycle                                                                                                                                TREATMENT DATE:   TE: On plinth: LLE Bolster support SAQ 3x10 with 3 sec hold  SLR 10x, 5x  Heel slide 10x with 3 sec hold Hooklye adductor squeeze with p.ball  2x10x 3 sec hold/rep  Hooklye hip abduction with yellow t.band 15x  Hooklye march with YTB 20x alt LE SLR LLE 6x Green pball hamstring curls x 15 BLE - reports tightness  Seated heel slide with YTB 12x LAQ LLE 20x  With 1# aw LLE LAQ 15x Attempting standing quad stretch, pt unable to achieve comfortable position at this time  Self Care Home management: Instruction in safe use of ice    PATIENT EDUCATION:  Education details: POC Person educated: Patient Education method: Explanation Education comprehension: verbalized understanding   HOME EXERCISE PROGRAM:  Access Code: [email or web address] URL: [email or web address] Date: 07/22/2023 Prepared by: Thresa Ross  Exercises - Seated Long Arc Quad  - 1 x daily - 7 x weekly - 2 sets - 10 reps - 3 sec hold - Supine Heel Slide  - 1 x daily - 7 x weekly - 2 sets - 10 reps - 3 sec hold - Supine Gluteal Sets  - 1 x daily - 7 x weekly - 2 sets - 10 reps - 3 sec  hold - Long Sitting Quad Set  - 1 x daily - 7 x weekly - 2 sets - 10 reps - 3 sec hold  ASSESSMENT:  CLINICAL IMPRESSION:  Chartered loss adjuster continued plan as laid out in recent sessions. Pt found most limited with hamstring curls at this time. Attempted quad stretch, but pt not able to achieve comfortable positioning. Will continue to modify  interventions as needed. Pt will continue to benefit from skilled physical therapy intervention to address impairments, improve QOL, and attain therapy goals.    OBJECTIVE IMPAIRMENTS: Abnormal gait, decreased activity tolerance, difficulty walking, decreased ROM, and decreased strength.   ACTIVITY LIMITATIONS: standing, squatting, stairs, and locomotion level  PARTICIPATION LIMITATIONS: meal prep, cleaning, laundry, shopping, community activity, occupation, and yard work  PERSONAL FACTORS: Time since onset of injury/illness/exacerbation and 1-2 comorbidities: hypertension, obesity  are also affecting patient's functional outcome.   REHAB POTENTIAL: Good  CLINICAL DECISION MAKING: Evolving/moderate complexity  EVALUATION COMPLEXITY: Moderate   GOALS: Goals reviewed with patient? Yes  SHORT TERM GOALS: Target date: 08/15/2023     Patient will be independent in home exercise program to improve strength/mobility for better functional independence with ADLs. Baseline: No HEP currently  Goal status: INITIAL  LONG TERM GOALS: Target date: 09/12/2023    Patient will improve lower extremity functional scale by 12 points or greater in order to indicate improvement in subjective level of function as a result of her left knee pain Baseline: 40 Goal status: INITIAL  2.  Patient will improve her left knee range of motion by 10 degrees with both flexion and extension in order to allow for improved gait pattern as well as decreased restriction with activities. Baseline: See evaluation chart Goal status: INITIAL  3.  Patient will complete 5 times sit to stand and 10 seconds or less in order to indicate improved lower extremity strength and power Baseline: 12.19 seconds Goal status: INITIAL  4.  Patient will improve 10 m walk test to 1 m/s or faster in order to indicate improved gait speed for community based ambulation and safety Baseline: .82 m/s Goal status: INITIAL  5.  Patient will  improve 6-minute walk test by 150 feet or greater in order to indicate improved capacity for community based ambulation Baseline: Test visit 2  Goal status: INITIAL   PLAN:  PT FREQUENCY: 2x/week  PT DURATION: 8 weeks  PLANNED INTERVENTIONS: 97110-Therapeutic exercises, 97530- Therapeutic activity, 97112- Neuromuscular re-education, 97535- Self Care, 86578- Manual therapy, 97116- Gait training, Balance training, Dry Needling, Joint mobilization, Joint manipulation, Scar mobilization, and Cryotherapy  PLAN FOR NEXT SESSION: 6 MWT, knee ROM and strength, hip strength and knee strength testing    Baird Kay, PT 07/26/2023, 9:39 AM

## 2023-07-25 NOTE — Telephone Encounter (Signed)
This patient called here trying to schedule for the [place] office.  She works there at [employer]  In speaking w/her found out she is going to [facility], she's in PT and has had several lt knee surgeries.  I explained that we need her records, CD's, reports and surgery notes.  She kept saying she just wants to get injections.  I told her we would need that information for him to review and advise.  She stated the reason she is switching is because she switched plans with [facility] and it will be cheaper for her to see a [facility] provider.

## 2023-07-28 ENCOUNTER — Other Ambulatory Visit: Payer: Self-pay

## 2023-07-30 ENCOUNTER — Ambulatory Visit: Payer: Commercial Managed Care - PPO

## 2023-07-30 DIAGNOSIS — M6281 Muscle weakness (generalized): Secondary | ICD-10-CM

## 2023-07-30 DIAGNOSIS — G8929 Other chronic pain: Secondary | ICD-10-CM | POA: Diagnosis not present

## 2023-07-30 DIAGNOSIS — M25562 Pain in left knee: Secondary | ICD-10-CM | POA: Diagnosis not present

## 2023-07-30 DIAGNOSIS — R269 Unspecified abnormalities of gait and mobility: Secondary | ICD-10-CM

## 2023-07-30 DIAGNOSIS — R262 Difficulty in walking, not elsewhere classified: Secondary | ICD-10-CM

## 2023-07-30 NOTE — Therapy (Signed)
OUTPATIENT PHYSICAL THERAPY LOWER EXTREMITY TREATMENT   Patient Name: Latoya Lutz MRN: [id number] DOB:11-Aug-1970, 53 y.o., female Today's Date: 07/30/2023  END OF SESSION:  PT End of Session - 07/30/23 1456     Visit Number 4    Number of Visits 16    Date for PT Re-Evaluation 09/12/23    Progress Note Due on Visit 10    PT Start Time 1403    PT Stop Time 1445    PT Time Calculation (min) 42 min    Equipment Utilized During Treatment Gait belt    Activity Tolerance Patient tolerated treatment well;No increased pain    Behavior During Therapy [facility] for tasks assessed/performed               Past Medical History:  Diagnosis Date   Acid reflux    Actinic keratosis    Allergy    Arthritis    Atypical mole 10/27/2019   Left labia mucosa    Atypical nevus 05/25/2020   R labia majora - atypical proliferation   Candidal dermatitis 05/25/2014   Cervical spine disease    Chicken pox    Chronic urethral narrowing    undergoing stretching   Complication of anesthesia    Dysplastic nevus 04/05/2021   R post med thigh near popliteal, mod to severe atypia   Endometriosis    Dr. Gerrie Nordmann at [facility], removed    Esophageal reflux 05/25/2014   Gross hematuria 04/25/2015   H/O cystoscopy    normal   Heart murmur    Hx of basal cell carcinoma 12/29/2008   L low back   Hypertension    Infection of urinary tract 09/11/2014   Kidney stone 09/11/2014   Kidney stones    Microscopic hematuria 09/11/2014   Obesity, morbid, BMI 40.0-49.9 (HCC) 04/10/2012   PONV (postoperative nausea and vomiting)    Right ureteral stone 04/28/2015   Skin cancer    Urinary retention    Past Surgical History:  Procedure Laterality Date   ABDOMINAL HYSTERECTOMY     BLADDER SURGERY  07/2003   CARPAL TUNNEL RELEASE Right    CESAREAN SECTION  1998   CHOLECYSTECTOMY     COLONOSCOPY WITH PROPOFOL N/A 08/24/2021   Procedure: COLONOSCOPY WITH PROPOFOL;  Surgeon: Midge Minium, MD;  Location: [facility]  ENDOSCOPY;  Service: Endoscopy;  Laterality: N/A;  9 AM ARRIVAL, PLEASE   CYSTOSCOPY W/ RETROGRADES Right 05/16/2015   Procedure: CYSTOSCOPY WITH RETROGRADE PYELOGRAM;  Surgeon: Hildred Laser, MD;  Location: [facility] ORS;  Service: Urology;  Laterality: Right;   CYSTOSCOPY W/ URETERAL STENT PLACEMENT Right 05/16/2015   Procedure: CYSTOSCOPY WITH STENT REPLACEMENT;  Surgeon: Hildred Laser, MD;  Location: [facility] ORS;  Service: Urology;  Laterality: Right;   CYSTOSCOPY WITH STENT PLACEMENT Right 04/29/2015   Procedure: CYSTOSCOPY WITH STENT PLACEMENT;  Surgeon: Hildred Laser, MD;  Location: [facility] ORS;  Service: Urology;  Laterality: Right;   ESOPHAGOGASTRODUODENOSCOPY N/A 08/24/2021   Procedure: ESOPHAGOGASTRODUODENOSCOPY (EGD);  Surgeon: Midge Minium, MD;  Location: [facility] ENDOSCOPY;  Service: Endoscopy;  Laterality: N/A;   EXTRACORPOREAL SHOCK WAVE LITHOTRIPSY Left 06/02/2015   Procedure: EXTRACORPOREAL SHOCK WAVE LITHOTRIPSY (ESWL);  Surgeon: Vanna Scotland, MD;  Location: [facility] ORS;  Service: Urology;  Laterality: Left;   FOOT SURGERY  2003   KNEE SURGERY  2005   TONSILLECTOMY     TONSILLECTOMY     lingual growth on vocal cord removal   TOTAL ABDOMINAL HYSTERECTOMY W/ BILATERAL SALPINGOOPHORECTOMY     [facility]  complete   URETEROSCOPY WITH HOLMIUM LASER LITHOTRIPSY Right 05/16/2015   Procedure: URETEROSCOPY WITH HOLMIUM LASER LITHOTRIPSY;  Surgeon: Hildred Laser, MD;  Location: [facility] ORS;  Service: Urology;  Laterality: Right;   Patient Active Problem List   Diagnosis Date Noted   Psoriasis 06/27/2023   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   Essential hypertension 09/13/2019   Acute pain of left knee 07/10/2018   Acute pain of right shoulder 07/10/2018   Hyperlipidemia 10/19/2016   Murmur 07/23/2016   Acute anxiety 03/05/2016   Dysphagia 11/14/2015   History of urinary stone 10/29/2015   History of urethral narrowing 07/16/2015   Esophageal reflux  05/25/2014   Morbid obesity (HCC) 04/10/2012    PCP: Dorcas Carrow, DO   REFERRING PROVIDER:   Juanell Fairly, MD    REFERRING DIAG: [phone] (ICD-10-CM) - Pain in left knee   THERAPY DIAG:  Muscle weakness (generalized)  Chronic pain of left knee  Difficulty in walking, not elsewhere classified  Abnormality of gait and mobility  Rationale for Evaluation and Treatment: Rehabilitation  ONSET DATE: 01/29/22  SUBJECTIVE:   SUBJECTIVE STATEMENT:  Pt states overall feeling better last couple of weeks. Feels like the PT is helping. Rates left knee pain at 3-4/10 upon arrival. States doing so well that she did not use her knee brace at work today.     PERTINENT HISTORY:  Pt reports knee is about the same as last session. Reports she did have a meniscus tear in 2020 but left that off her history last date.   From initial eval: Pt had initial injury in 2007 where her patella dislocated. Pt eventually had to have knee surgery. Pt was good for a while until she recently had to get corticosteroid injections. Most recent Corticosteroid injection in 05/22/23.Pt now would like to proceed with gel injections for the knee but wants to try PT first as well. Potential diagnosis of psoriatic arthritis but awaiting to see specialist.  Patient initially had good success with corticosteroid injections but most recently they have been much less effective.  Patient reports she usually does not wear knee brace but does wear the brace when her pain flares up. History of chronic pain in her left knee, arthritis, hypertension, obesity PAIN:  Are you having pain? Yes: NPRS scale: 5 Pain location: left knee  Pain description: sharp  Aggravating factors: Walking Relieving factors: rest, injections.   PRECAUTIONS: None  RED FLAGS: None   WEIGHT BEARING RESTRICTIONS: No  FALLS:  Has patient fallen in last 6 months? No  LIVING ENVIRONMENT: Lives with: lives with their family and lives with  their spouse Lives in: House/apartment Stairs: Yes: Internal: 6 steps; on left going up and External: 3 steps; on left going up Has following equipment at home: None  OCCUPATION: Mostly seated work, a lot of standing at home   PLOF: Independent  PATIENT GOALS: Improve knee function and pain   NEXT MD VISIT: No scheduled appointment, waiting to see if gel injections are authorized   OBJECTIVE:  Note: Objective measures were completed at Evaluation unless otherwise noted.  DIAGNOSTIC FINDINGS: From MRI 07/18/2018:  "1. High-grade partial thickness radial tear at the meniscal root region of the medial meniscus with partial detachment and medial protrusion of the dysfunctional meniscus. 2. Intact ligamentous structures and no acute bony findings. 3. Significant tricompartmental degenerative changes as detailed above. Associated stress reaction involving the medial tibial plateau without definite stress fracture. 4. Small joint effusion and  mild synovitis. Very small Baker's cyst." PATIENT SURVEYS:  LEFS 40/80  COGNITION: Overall cognitive status: Within functional limits for tasks assessed        MUSCLE LENGTH: Hamstrings: Right WNL with taking knee joint limitation into account     POSTURE: weight shift right  PALPATION: Tenderness to palpation along left IT band, left gastrocnemius, and left inferior joint line.  LOWER EXTREMITY ROM:  Active ROM Right eval Left eval  Hip flexion    Hip extension    Hip abduction    Hip adduction    Hip internal rotation    Hip external rotation    Knee flexion 120 85  Knee extension 0 20 from full ext   Ankle dorsiflexion    Ankle plantarflexion    Ankle inversion    Ankle eversion     (Blank rows = not tested)  LOWER EXTREMITY MMT:  MMT Right eval Left eval  Hip flexion    Hip extension    Hip abduction    Hip adduction    Hip internal rotation    Hip external rotation    Knee flexion    Knee extension    Ankle  dorsiflexion    Ankle plantarflexion    Ankle inversion    Ankle eversion     (Blank rows = not tested)   FUNCTIONAL TESTS:  5 times sit to stand: 12.19 sec 6 minute walk test: test visit 2 10 meter walk test: 12.4 sec   GAIT: Distance walked: 40 ft Assistive device utilized: None Level of assistance: Complete Independence Comments: Don joy knee brace donned, increased time on R LE> LLE, knee flexion throughout gait cycle                                                                                                                                TREATMENT DATE:   TE: On plinth: LLE Quad sets (LLE) with palpation on patella tendon to maintain alignment with sup patella motion -hold 3 sec x 10 (no pain reported)  SLR 10x,  Heel slide 10x with 3 sec hold Supine hip abduction 15x  Supine heel slide x 15 reps  Manual therapy:  -Grade 3 tibiofemoral joint mobs- PA/AP  x 20 bouts -Mobilization with movement- Supine- Grade 2-3 tibiofemoral joint mobs- AP/PA in varying degrees of left knee flex x several min.   6 Min Walk Test:  Instructed patient to ambulate as quickly and as safely as possible for 6 minutes using LRAD. Patient was allowed to take standing rest breaks without stopping the test, but if the patient required a sitting rest break the clock would be stopped and the test would be over.  Results: 1270 feet (387 meters) using No AD with Supervision. Results indicate that the patient has reduced endurance with ambulation compared to age matched norms.  Age Matched Norms: 59-69 yo F: 538 MDC: 58.21 meters (190.98 feet) or 50 meters (ANPTA Core  Set of Outcome Measures for Adults with Neurologic Conditions, 2018)    Self Care Home management: Review use of ice and instructed in ice massage 3-5 to be performed at work or home. Added hip strengthening to HEP- Standing x 10 reps (goal at home for 3 sets of 10 reps)  Added hip ER (standing) x10 reps (goal of performing 3sets of  10 reps at Home)  Use of modalities for pain relief: (unbilled)  2.5 min ice massage using ice cup to medial left knee.  *Patient reported no pain at end of session.    PATIENT EDUCATION:  Education details: POC Person educated: Patient Education method: Explanation Education comprehension: verbalized understanding   HOME EXERCISE PROGRAM:    07/30/2023 -Added standing hip ABD and Hip ER (goal of 3 sets of 10 reps) 3x/week   Access Code: [email or web address] URL: [email or web address] Date: 07/22/2023 Prepared by: Thresa Ross  Exercises - Seated Long Arc Quad  - 1 x daily - 7 x weekly - 2 sets - 10 reps - 3 sec hold - Supine Heel Slide  - 1 x daily - 7 x weekly - 2 sets - 10 reps - 3 sec hold - Supine Gluteal Sets  - 1 x daily - 7 x weekly - 2 sets - 10 reps - 3 sec hold - Long Sitting Quad Set  - 1 x daily - 7 x weekly - 2 sets - 10 reps - 3 sec hold  ASSESSMENT:  CLINICAL IMPRESSION:  Treatment continued per plan of care- using manual therapy for pain relief and improved knee ROM. Patient responded well with all techniques and self reported pain from 3-4/10 initially down to no left knee pain at end of session. She was instructed in importance of hip strengthening to help with knee pain. She had no issues (no pain and just weakness associated with left LE exercises today). Patient also completed walk test and presents with deficits as compared with age related normsl with some noted antalgia on left side.  Pt will continue to benefit from skilled physical therapy intervention to address impairments, improve QOL, and attain therapy goals.    OBJECTIVE IMPAIRMENTS: Abnormal gait, decreased activity tolerance, difficulty walking, decreased ROM, and decreased strength.   ACTIVITY LIMITATIONS: standing, squatting, stairs, and locomotion level  PARTICIPATION LIMITATIONS: meal prep, cleaning, laundry, shopping, community activity, occupation, and yard  work  PERSONAL FACTORS: Time since onset of injury/illness/exacerbation and 1-2 comorbidities: hypertension, obesity  are also affecting patient's functional outcome.   REHAB POTENTIAL: Good  CLINICAL DECISION MAKING: Evolving/moderate complexity  EVALUATION COMPLEXITY: Moderate   GOALS: Goals reviewed with patient? Yes  SHORT TERM GOALS: Target date: 08/15/2023     Patient will be independent in home exercise program to improve strength/mobility for better functional independence with ADLs. Baseline: No HEP currently  Goal status: INITIAL  LONG TERM GOALS: Target date: 09/12/2023    Patient will improve lower extremity functional scale by 12 points or greater in order to indicate improvement in subjective level of function as a result of her left knee pain Baseline: 40 Goal status: INITIAL  2.  Patient will improve her left knee range of motion by 10 degrees with both flexion and extension in order to allow for improved gait pattern as well as decreased restriction with activities. Baseline: See evaluation chart Goal status: INITIAL  3.  Patient will complete 5 times sit to stand and 10 seconds or less in order to indicate improved lower extremity strength and power  Baseline: 12.19 seconds Goal status: INITIAL  4.  Patient will improve 10 m walk test to 1 m/s or faster in order to indicate improved gait speed for community based ambulation and safety Baseline: .82 m/s Goal status: INITIAL  5.  Patient will improve 6-minute walk test by 150 feet or greater in order to indicate improved capacity for community based ambulation Baseline: Test visit 2; 07/30/2023= 1270 feet Goal status: INITIAL   PLAN:  PT FREQUENCY: 2x/week  PT DURATION: 8 weeks  PLANNED INTERVENTIONS: 97110-Therapeutic exercises, 97530- Therapeutic activity, 97112- Neuromuscular re-education, 97535- Self Care, 78295- Manual therapy, 97116- Gait training, Balance training, Dry Needling, Joint  mobilization, Joint manipulation, Scar mobilization, and Cryotherapy  PLAN FOR NEXT SESSION:, knee ROM and strength, hip strength and mobility interventions   Lenda Kelp, PT 07/30/2023, 3:17 PM

## 2023-08-01 ENCOUNTER — Ambulatory Visit: Payer: Commercial Managed Care - PPO

## 2023-08-07 ENCOUNTER — Ambulatory Visit: Payer: Commercial Managed Care - PPO

## 2023-08-08 ENCOUNTER — Ambulatory Visit: Payer: Commercial Managed Care - PPO

## 2023-08-09 DIAGNOSIS — M1712 Unilateral primary osteoarthritis, left knee: Secondary | ICD-10-CM | POA: Diagnosis not present

## 2023-08-16 DIAGNOSIS — M1712 Unilateral primary osteoarthritis, left knee: Secondary | ICD-10-CM | POA: Diagnosis not present

## 2023-08-20 NOTE — Progress Notes (Unsigned)
 08/21/23 4:33 PM   Latoya Lutz 07-30-1970 [id number]  Referring provider:  Dorcas Carrow, DO [address] [place],  [place] [address]  Urological history  1. Urethral stricture - diagnosed by Dr. Leonette Monarch with urethral dilation and had been receiving serial dilations every 4 to 6 months since 2013 - managed with urethral dilation q 5 months at this time   2. Nephrolithiasis - right ureteral stones treated with ESWL and right URS/LL/right ureteral stent placement and stent removal in winter 2016 - CT Renal stone study performed on 04/05/2017 noted no nephroureterolithiasis.  No hydronephrosis - CT Renal stone study (06/2023) - no stones   Chief Complaint  Patient presents with   Follow-up    HPI: Latoya Lutz is a 53 y.o.female  who presents today for urethral dilation and symptoms recheck.   Previous records reviewed.   UA unremarkable.   She is no longer having symptoms of diverticulitis.  She is having frequency and urgency.  Patient denies any modifying or aggravating factors.  Patient denies any recent UTI's, gross hematuria, dysuria or suprapubic/flank pain.  Patient denies any fevers, chills, nausea or vomiting.     PMH: Past Medical History:  Diagnosis Date   Acid reflux    Actinic keratosis    Allergy    Arthritis    Atypical mole 10/27/2019   Left labia mucosa    Atypical nevus 05/25/2020   R labia majora - atypical proliferation   Candidal dermatitis 05/25/2014   Cervical spine disease    Chicken pox    Chronic urethral narrowing    undergoing stretching   Complication of anesthesia    Dysplastic nevus 04/05/2021   R post med thigh near popliteal, mod to severe atypia   Endometriosis    Dr. Gerrie Nordmann at [facility], removed    Esophageal reflux 05/25/2014   Gross hematuria 04/25/2015   H/O cystoscopy    normal   Heart murmur    Hx of basal cell carcinoma 12/29/2008   L low back   Hypertension    Infection of urinary tract 09/11/2014   Kidney  stone 09/11/2014   Kidney stones    Microscopic hematuria 09/11/2014   Obesity, morbid, BMI 40.0-49.9 (HCC) 04/10/2012   PONV (postoperative nausea and vomiting)    Right ureteral stone 04/28/2015   Skin cancer    Urinary retention     Surgical History: Past Surgical History:  Procedure Laterality Date   ABDOMINAL HYSTERECTOMY     BLADDER SURGERY  07/2003   CARPAL TUNNEL RELEASE Right    CESAREAN SECTION  1998   CHOLECYSTECTOMY     COLONOSCOPY WITH PROPOFOL N/A 08/24/2021   Procedure: COLONOSCOPY WITH PROPOFOL;  Surgeon: Midge Minium, MD;  Location: [facility] ENDOSCOPY;  Service: Endoscopy;  Laterality: N/A;  9 AM ARRIVAL, PLEASE   CYSTOSCOPY W/ RETROGRADES Right 05/16/2015   Procedure: CYSTOSCOPY WITH RETROGRADE PYELOGRAM;  Surgeon: Hildred Laser, MD;  Location: [facility] ORS;  Service: Urology;  Laterality: Right;   CYSTOSCOPY W/ URETERAL STENT PLACEMENT Right 05/16/2015   Procedure: CYSTOSCOPY WITH STENT REPLACEMENT;  Surgeon: Hildred Laser, MD;  Location: [facility] ORS;  Service: Urology;  Laterality: Right;   CYSTOSCOPY WITH STENT PLACEMENT Right 04/29/2015   Procedure: CYSTOSCOPY WITH STENT PLACEMENT;  Surgeon: Hildred Laser, MD;  Location: [facility] ORS;  Service: Urology;  Laterality: Right;   ESOPHAGOGASTRODUODENOSCOPY N/A 08/24/2021   Procedure: ESOPHAGOGASTRODUODENOSCOPY (EGD);  Surgeon: Midge Minium, MD;  Location: [facility] ENDOSCOPY;  Service: Endoscopy;  Laterality:  N/A;   EXTRACORPOREAL SHOCK WAVE LITHOTRIPSY Left 06/02/2015   Procedure: EXTRACORPOREAL SHOCK WAVE LITHOTRIPSY (ESWL);  Surgeon: Vanna Scotland, MD;  Location: [facility] ORS;  Service: Urology;  Laterality: Left;   FOOT SURGERY  2003   KNEE SURGERY  2005   TONSILLECTOMY     TONSILLECTOMY     lingual growth on vocal cord removal   TOTAL ABDOMINAL HYSTERECTOMY W/ BILATERAL SALPINGOOPHORECTOMY     [facility] complete   URETEROSCOPY WITH HOLMIUM LASER LITHOTRIPSY Right 05/16/2015   Procedure: URETEROSCOPY WITH HOLMIUM LASER  LITHOTRIPSY;  Surgeon: Hildred Laser, MD;  Location: [facility] ORS;  Service: Urology;  Laterality: Right;    Home Medications:  Allergies as of 08/21/2023       Reactions   Atorvastatin Other (See Comments)   Leg cramps        Medication List        Accurate as of August 21, 2023  4:33 PM. If you have any questions, ask your nurse or doctor.          acyclovir 400 MG tablet Commonly known as: ZOVIRAX Take 1 tablet (400 mg total) by mouth 3 (three) times daily.   albuterol 108 (90 Base) MCG/ACT inhaler Commonly known as: VENTOLIN HFA Inhale 2 puffs into the lungs every 6 (six) hours as needed for wheezing or shortness of breath.   amoxicillin-clavulanate 875-125 MG tablet Commonly known as: AUGMENTIN Take 1 tablet by mouth every 12 (twelve) hours.   Azelastine HCl 137 MCG/SPRAY Soln Place 2 sprays into both nostrils 2 (two) times daily. Use in each nostril as directed   busPIRone 10 MG tablet Commonly known as: BUSPAR Take 1 tablet (10 mg total) by mouth 3 (three) times daily.   CALCIUM 600 PO Take 1 tablet by mouth daily.   cyclobenzaprine 10 MG tablet Commonly known as: FLEXERIL Take 1 tablet (10 mg total) by mouth at bedtime.   diclofenac Sodium 1 % Gel Commonly known as: VOLTAREN Apply 2 g topically 4 (four) times daily.   ezetimibe 10 MG tablet Commonly known as: ZETIA Take 1 tablet (10 mg total) by mouth daily.   fexofenadine 180 MG tablet Commonly known as: ALLEGRA Take 180 mg by mouth daily.   fluticasone 50 MCG/ACT nasal spray Commonly known as: FLONASE PLACE 2 SPRAYS INTO BOTH NOSTRILS DAILY.   hydrochlorothiazide 25 MG tablet Commonly known as: HYDRODIURIL Take 1 tablet (25 mg total) by mouth daily.   Magnesium 100 MG Caps Take 30 mg by mouth 2 (two) times daily.   meloxicam 15 MG tablet Commonly known as: MOBIC Take 1 tablet (15 mg total) by mouth daily.   Menopause Formula Tabs Take by mouth. Reported on 11/04/2015    pantoprazole 40 MG tablet Commonly known as: PROTONIX Take 2 tablets (80 mg total) by mouth daily.   pravastatin 20 MG tablet Commonly known as: PRAVACHOL Take 1 tablet (20 mg total) by mouth 3 (three) times a week. Tuesday,Thursday,Saturday   pseudoephedrine 30 MG tablet Commonly known as: SUDAFED Take 30 mg by mouth 3 (three) times daily.   Simethicone 180 MG Caps Take 1 capsule (180 mg total) by mouth 3 (three) times daily with meals.   sucralfate 1 g tablet Commonly known as: CARAFATE Take 1 tablet (1 g total) by mouth 4 (four) times daily -  with meals and at bedtime.   vitamin E 180 MG (400 UNITS) capsule Take 400 Units by mouth daily.   Zoryve 0.3 % Crea Generic drug: Roflumilast Apply  once daily to affected areas of psoriasis        Allergies:  Allergies  Allergen Reactions   Atorvastatin Other (See Comments)    Leg cramps    Family History: Family History  Problem Relation Age of Onset   Cancer Mother        pancreatic cancer   Heart disease Mother        Pacemaker and defib   Hypertension Mother    Hyperlipidemia Mother    Early death Mother    Stroke Mother    Varicose Veins Mother    Cancer Father    COPD Father        Lung and brain cancer   Early death Father    Cancer Maternal Grandmother        Colon   Diabetes Paternal Grandmother    Cirrhosis Paternal Grandmother    Cancer Paternal Grandmother    Kidney disease Neg Hx    Bladder Cancer Neg Hx    Breast cancer Neg Hx     Social History:  reports that she quit smoking about 15 years ago. Her smoking use included cigarettes. She started smoking about 32 years ago. She has a 8.5 pack-year smoking history. She has been exposed to tobacco smoke. She has never used smokeless tobacco. She reports that she does not drink alcohol and does not use drugs.   Physical Exam: Blood pressure 138/85, pulse 69, height 5\' 6"  (1.676 m), weight 292 lb (132.5 kg).  Constitutional:  Well nourished.  Alert and oriented, No acute distress. HEENT: [place] AT, moist mucus membranes.  Trachea midline Cardiovascular: No clubbing, cyanosis, or edema. Respiratory: Normal respiratory effort, no increased work of breathing. GU: No CVA tenderness.  No bladder fullness or masses.  Normal urethral meatus, no lesions, no prolapse, no discharge.   No urethral masses, tenderness and/or tenderness. No bladder fullness, tenderness or masses.   Anus and perineum are without rashes or lesions.    Neurologic: Grossly intact, no focal deficits, moving all 4 extremities. Psychiatric: Normal mood and affect.    Laboratory Data: Urinalysis See EPIC and HPI I have reviewed the labs.   Pertinent Imaging N/A   Procedure: Patient is placed in stirrups and her urethral meatus and vulva are cleansed with Betadine.  2% Lidocaine jelly was inserted into her urethra.  I then dilated her with Leta Jungling sounds to a 32 Fr  without difficultly.  She tolerated the procedure well.  She will return in 5 months.   Assessment & Plan:    1. Urethral stricture -UA unremarkable -return in 5 months for dilation  2. Suprapubic pain -Secondary to diverticulitis, resolved   Return in about 5 months (around 01/21/2024) for urethral dilation .  Cloretta Ned   [facility] [address] [place], [place] [address] [phone]

## 2023-08-21 ENCOUNTER — Encounter: Payer: Self-pay | Admitting: Urology

## 2023-08-21 ENCOUNTER — Ambulatory Visit: Payer: 59 | Admitting: Urology

## 2023-08-21 VITALS — BP 138/85 | HR 69 | Ht 66.0 in | Wt 292.0 lb

## 2023-08-21 DIAGNOSIS — N3582 Other urethral stricture, female: Secondary | ICD-10-CM | POA: Diagnosis not present

## 2023-08-21 DIAGNOSIS — R102 Pelvic and perineal pain: Secondary | ICD-10-CM

## 2023-08-21 LAB — URINALYSIS, COMPLETE
Bilirubin, UA: NEGATIVE
Glucose, UA: NEGATIVE
Ketones, UA: NEGATIVE
Leukocytes,UA: NEGATIVE
Nitrite, UA: NEGATIVE
Protein,UA: NEGATIVE
RBC, UA: NEGATIVE
Specific Gravity, UA: 1.02 (ref 1.005–1.030)
Urobilinogen, Ur: 0.2 mg/dL (ref 0.2–1.0)
pH, UA: 6 (ref 5.0–7.5)

## 2023-08-21 LAB — MICROSCOPIC EXAMINATION: Bacteria, UA: NONE SEEN

## 2023-08-23 DIAGNOSIS — M1712 Unilateral primary osteoarthritis, left knee: Secondary | ICD-10-CM | POA: Diagnosis not present

## 2023-09-03 ENCOUNTER — Other Ambulatory Visit: Payer: Self-pay

## 2023-09-03 ENCOUNTER — Other Ambulatory Visit: Payer: Self-pay | Admitting: Family Medicine

## 2023-09-03 ENCOUNTER — Other Ambulatory Visit (HOSPITAL_COMMUNITY): Payer: Self-pay

## 2023-09-04 ENCOUNTER — Other Ambulatory Visit: Payer: Self-pay

## 2023-09-04 NOTE — Telephone Encounter (Signed)
 Requested medication (s) are due for refill today: Yes  Requested medication (s) are on the active medication list: Yes  Last refill:  12/04/21  Future visit scheduled: Yes  Notes to clinic:  Prescription has expired.    Requested Prescriptions  Pending Prescriptions Disp Refills   fluticasone (FLONASE) 50 MCG/ACT nasal spray 48 g 3    Sig: PLACE 2 SPRAYS INTO BOTH NOSTRILS DAILY.     Ear, Nose, and Throat: Nasal Preparations - Corticosteroids Passed - 09/04/2023  2:26 PM      Passed - Valid encounter within last 12 months    Recent Outpatient Visits           2 months ago Mixed hyperlipidemia   [place] [facility] [place], Megan P, DO   4 months ago Sinus congestion   [place] [facility] [place], Sherran Needs, NP   8 months ago Cellulitis of right upper extremity   [place] [facility] [place], Megan P, DO   8 months ago Routine general medical examination at a health care facility   [facility], Megan P, DO   11 months ago Laryngitis   [place] [facility] [place], Oralia Rud, DO       Future Appointments             In 4 months Laural Benes, Oralia Rud, DO [place] [facility]   In 4 months [place], Elana [facility] [place]   In 7 months Deirdre Evener, MD [facility]

## 2023-09-05 ENCOUNTER — Other Ambulatory Visit: Payer: Self-pay

## 2023-09-05 MED FILL — Fluticasone Propionate Nasal Susp 50 MCG/ACT: NASAL | 90 days supply | Qty: 48 | Fill #0 | Status: CN

## 2023-09-16 ENCOUNTER — Other Ambulatory Visit: Payer: Self-pay

## 2023-09-19 ENCOUNTER — Ambulatory Visit

## 2023-09-19 ENCOUNTER — Ambulatory Visit: Attending: Internal Medicine | Admitting: Internal Medicine

## 2023-09-19 ENCOUNTER — Encounter: Payer: Self-pay | Admitting: Internal Medicine

## 2023-09-19 VITALS — BP 154/83 | HR 63 | Resp 16 | Ht 65.0 in | Wt 300.0 lb

## 2023-09-19 DIAGNOSIS — Z79899 Other long term (current) drug therapy: Secondary | ICD-10-CM | POA: Diagnosis not present

## 2023-09-19 DIAGNOSIS — L4050 Arthropathic psoriasis, unspecified: Secondary | ICD-10-CM

## 2023-09-19 DIAGNOSIS — L409 Psoriasis, unspecified: Secondary | ICD-10-CM | POA: Diagnosis not present

## 2023-09-19 DIAGNOSIS — M47816 Spondylosis without myelopathy or radiculopathy, lumbar region: Secondary | ICD-10-CM | POA: Diagnosis not present

## 2023-09-19 NOTE — Progress Notes (Signed)
 Office Visit Note  Patient: Latoya Lutz             Date of Birth: 04/07/1971           MRN: [id number]             PCP: Solomon Dupre, DO Referring: Solomon Dupre, DO Visit Date: 09/19/2023 Occupation: Hospital registration  Subjective:  New Patient (Initial Visit) (Patient states she had a series of three Orthovisc injections in her left knee last month. Patient states she has psoriasis on both of her elbows and had scraped is and it had turned into cellulitis. Patient states she was told it was thought that she has Psoriatic Arthritis. Patient states injections and gel injections are not really working for her. Patient states she had an infection in her throat in 2017 that she believes may have triggered everything. Patient states she has random whelps.)   Discussed the use of AI scribe software for clinical note transcription with the patient, who gave verbal consent to proceed.  History of Present Illness   [name] "Moira Andrews" is a 53 year old female with psoriasis and psoriatic arthritis who presents with joint pain and skin issues. She was referred for evaluation of joint problems and a history of psoriasis.  She experiences ongoing joint pain, particularly in her knees, which has not improved significantly with gel injections. The pain is variable, with some days being better than others. She describes aching and burning sensations in her legs and notes that her legs feel like 'jello' in the mornings, requiring effort to get moving. She takes Mobic  daily and Tylenol  twice a day for pain management, and uses Voltaren  gel twice daily. She also takes a half tablet of methocarbamol at night to help with leg pain and relaxation. She has been diagnosed with psoriatic arthritis, with symptoms including joint pain, stiffness, and swelling. She reports that her nails have been peeling recently, and she experiences fatigue and difficulty sleeping due to pain in her legs,  hip, and back. She sleeps in a recliner as lying flat is uncomfortable. She has a history of knee surgery in 2007 and has been receiving cortisone shots, which are no longer effective.  She has a history of psoriasis, initially diagnosed after a severe throat infection in 2017 or 2018, which led to the removal of her second set of tonsils. Psoriasis initially appeared on her elbows and has since been managed with topical creams. She also reports pitting in her fingernails over the last two years, which she initially thought was nail fungus. She experiences rashes on her scalp and has dry skin, which she lotions daily. No recent significant rashes on her scalp, but she has experienced them in the past. No nail changes on her toes.  She has been evaluated for vascular issues due to leg cramps and swelling, but was advised to increase electrolyte intake. She drinks electrolyte water, which helps with the cramps. She has been told she has venous reflux but no significant vascular issues were found.  She has a history of bone spurs in her knees and ankles, and her spine has shown fusion of several levels in the lower thoracic region, which she was unaware of until recently. She experiences back pain and stiffness, which affects her mobility.       Activities of Daily Living:  Patient reports morning stiffness for 2-3 minutes.   Patient Reports nocturnal pain.  Difficulty dressing/grooming: Denies Difficulty climbing stairs: Reports  Difficulty getting out of chair: Reports Difficulty using hands for taps, buttons, cutlery, and/or writing: Reports  Review of Systems  Constitutional:  Positive for fatigue.  HENT:  Positive for mouth sores and mouth dryness.   Eyes:  Negative for dryness.  Respiratory:  Positive for shortness of breath.   Cardiovascular:  Negative for chest pain and palpitations.  Gastrointestinal:  Positive for constipation and diarrhea. Negative for blood in stool.  Endocrine:  Positive for increased urination.  Genitourinary:  Negative for involuntary urination.  Musculoskeletal:  Positive for joint pain, gait problem, joint pain, joint swelling, myalgias, muscle weakness, morning stiffness, muscle tenderness and myalgias.  Skin:  Positive for color change, rash and hair loss. Negative for sensitivity to sunlight.  Allergic/Immunologic: Positive for susceptible to infections.  Neurological:  Positive for dizziness and headaches.  Hematological:  Negative for swollen glands.  Psychiatric/Behavioral:  Positive for sleep disturbance. Negative for depressed mood. The patient is not nervous/anxious.     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   Essential hypertension 09/13/2019   Acute pain of left knee 07/10/2018   Acute pain of right shoulder 07/10/2018   Hyperlipidemia 10/19/2016   Murmur 07/23/2016   Acute anxiety 03/05/2016   Dysphagia 11/14/2015   History of urinary stone 10/29/2015   History of urethral narrowing 07/16/2015   Esophageal reflux 05/25/2014   Morbid obesity (HCC) 04/10/2012    Past Medical History:  Diagnosis Date   Acid reflux    Actinic keratosis    Allergy    Arthritis    Atypical mole 10/27/2019   Left labia mucosa    Atypical nevus 05/25/2020   R labia majora - atypical proliferation   Candidal dermatitis 05/25/2014   Cervical spine disease    Chicken pox    Chronic urethral narrowing    undergoing stretching   Complication of anesthesia    Diverticulitis    Dysplastic nevus 04/05/2021   R post med thigh near popliteal, mod to severe atypia   Endometriosis    Dr. Tarri Farm at [facility], removed    Esophageal reflux 05/25/2014   Gross hematuria 04/25/2015   H/O cystoscopy    normal   Heart murmur    Hx of basal cell carcinoma 12/29/2008   L low back   Hypertension     Infection of urinary tract 09/11/2014   Kidney stone 09/11/2014   Kidney stones    Microscopic hematuria 09/11/2014   Obesity, morbid, BMI 40.0-49.9 (HCC) 04/10/2012   PONV (postoperative nausea and vomiting)    Right ureteral stone 04/28/2015   Skin cancer    Urinary retention     Family History  Problem Relation Age of Onset   Cancer Mother        pancreatic cancer   Heart disease Mother        Pacemaker and defib   Hypertension Mother    Hyperlipidemia Mother    Early death Mother    Stroke Mother    Varicose Veins Mother    Cancer Father    COPD Father        Lung and brain cancer   Early death Father    Cancer Maternal Grandmother        Colon   Diabetes Paternal Grandmother    Cirrhosis Paternal Grandmother    Cancer Paternal Grandmother    Kidney  disease Neg Hx    Bladder Cancer Neg Hx    Breast cancer Neg Hx    Past Surgical History:  Procedure Laterality Date   ABDOMINAL HYSTERECTOMY     BLADDER SURGERY  07/2003   CARPAL TUNNEL RELEASE Right    CESAREAN SECTION  1998   CHOLECYSTECTOMY     COLONOSCOPY WITH PROPOFOL  N/A 08/24/2021   Procedure: COLONOSCOPY WITH PROPOFOL ;  Surgeon: Marnee Sink, MD;  Location: [facility] ENDOSCOPY;  Service: Endoscopy;  Laterality: N/A;  9 AM ARRIVAL, PLEASE   CYSTOSCOPY W/ RETROGRADES Right 05/16/2015   Procedure: CYSTOSCOPY WITH RETROGRADE PYELOGRAM;  Surgeon: Bart Born, MD;  Location: [facility] ORS;  Service: Urology;  Laterality: Right;   CYSTOSCOPY W/ URETERAL STENT PLACEMENT Right 05/16/2015   Procedure: CYSTOSCOPY WITH STENT REPLACEMENT;  Surgeon: Bart Born, MD;  Location: [facility] ORS;  Service: Urology;  Laterality: Right;   CYSTOSCOPY WITH STENT PLACEMENT Right 04/29/2015   Procedure: CYSTOSCOPY WITH STENT PLACEMENT;  Surgeon: Bart Born, MD;  Location: [facility] ORS;  Service: Urology;  Laterality: Right;   ESOPHAGOGASTRODUODENOSCOPY N/A 08/24/2021   Procedure: ESOPHAGOGASTRODUODENOSCOPY (EGD);  Surgeon: Marnee Sink, MD;  Location: [facility] ENDOSCOPY;  Service: Endoscopy;  Laterality: N/A;   EXTRACORPOREAL SHOCK WAVE LITHOTRIPSY Left 06/02/2015   Procedure: EXTRACORPOREAL SHOCK WAVE LITHOTRIPSY (ESWL);  Surgeon: Dustin Gimenez, MD;  Location: [facility] ORS;  Service: Urology;  Laterality: Left;   FOOT SURGERY  2003   KNEE SURGERY  2005   TONSILLECTOMY     TONSILLECTOMY     lingual growth on vocal cord removal   TOTAL ABDOMINAL HYSTERECTOMY W/ BILATERAL SALPINGOOPHORECTOMY     [facility] complete   URETEROSCOPY WITH HOLMIUM LASER LITHOTRIPSY Right 05/16/2015   Procedure: URETEROSCOPY WITH HOLMIUM LASER LITHOTRIPSY;  Surgeon: Bart Born, MD;  Location: [facility] ORS;  Service: Urology;  Laterality: Right;   Social History   Social History Narrative   Lives in [place] with son 15YO and fiance      Work - [employer] History  Administered Date(s) Administered   Fluad Quad(high Dose 65+) 02/27/2021   Influenza Split 03/19/2012, 02/25/2013   Influenza,inj,Quad PF,6+ Mos 03/25/2023   Influenza-Unspecified 02/23/2014, 03/15/2015, 03/21/2016, 03/28/2017, 02/19/2018, 03/02/2019, 02/26/2022   PFIZER(Purple Top)SARS-COV-2 Vaccination 06/08/2019, 06/29/2019   Td 06/18/2022   Tdap 09/30/2012   Zoster Recombinant(Shingrix) 05/19/2021, 08/17/2021     Objective: Vital Signs: BP (!) 154/83 (BP Location: Right Arm, Patient Position: Sitting, Cuff Size: Large)   Pulse 63   Resp 16   Ht 5\' 5"  (1.651 m)   Wt 300 lb (136.1 kg)   BMI 49.92 kg/m    Physical Exam Eyes:     Conjunctiva/sclera: Conjunctivae normal.  Cardiovascular:     Rate and Rhythm: Normal rate and regular rhythm.  Pulmonary:     Effort: Pulmonary effort is normal.     Breath sounds: Normal breath sounds.  Lymphadenopathy:     Cervical: No cervical adenopathy.  Skin:    General: Skin is warm and dry.  Neurological:     Mental Status: She is alert.  Psychiatric:        Mood and Affect: Mood normal.      Musculoskeletal Exam:   Shoulders full ROM no tenderness or swelling Elbows full ROM no tenderness or swelling Wrists full ROM no tenderness or swelling Fingers full ROM no tenderness or swelling Right hip tenderness on palpation Joint effusion present in the knee Reduced ankle flexibility   Investigation: No additional findings.  Imaging: No results found.  Recent Labs: Lab Results  Component Value Date   WBC 7.5 06/27/2023   HGB 11.8 06/27/2023   PLT 230 06/27/2023   NA 143 06/27/2023   K 4.2 06/27/2023   CL 102 06/27/2023   CO2 26 06/27/2023   GLUCOSE 97 06/27/2023   BUN 16 06/27/2023   CREATININE 0.73 06/27/2023   BILITOT 0.2 06/27/2023   ALKPHOS 104 06/27/2023   AST 14 06/27/2023   ALT 12 06/27/2023   PROT 6.3 06/27/2023   ALBUMIN 4.1 06/27/2023   CALCIUM  9.1 06/27/2023   GFRAA 104 08/05/2020   QFTBGOLDPLUS NEGATIVE 09/19/2023    Speciality Comments: No specialty comments available.  Procedures:  No procedures performed Allergies: Atorvastatin    Assessment / Plan:     Visit Diagnoses: Psoriatic arthritis (HCC) - Plan: XR Pelvis 1-2 Views, XR Lumbar Spine 2-3 Views, XR Thoracic Spine 2 View, Sedimentation rate, C-reactive protein Chronic psoriatic arthritis with joint pain, stiffness, swelling, nail pitting, and peeling. Current treatment inadequate. Biologics may be necessary for axial involvement or significant joint damage. Otezla is an alternative to methotrexate. - Order x-rays of hands and spine to assess joint damage and axial involvement. - Order blood tests for inflammation markers and screen for viral hepatitis and tuberculosis. - Discuss potential use of biologic medications if axial involvement is confirmed. - Consider Otezla as an alternative to methotrexate if oral medication is appropriate.  Psoriasis Chronic psoriasis managed with topical treatments. - Continue current topical treatments for psoriasis.  Osteoarthritis of lumbar spine, unspecified spinal  osteoarthritis complication status Diffuse Idiopathic Skeletal Hyperostosis (DISH) Multiple levels of spinal fusion likely due to DISH, contributing to decreased spinal mobility and back pain. - Order x-rays of lumbar and thoracic spine to evaluate extent of spinal fusion and bone spur formation.  High risk medication use - Plan: Hepatitis B surface antigen, Hepatitis B core antibody, IgM, QuantiFERON-TB Gold Plus  Venous Reflux Venous reflux causing leg swelling and discomfort. - Continue electrolyte intake to manage leg cramps.    Orders: Orders Placed This Encounter  Procedures   XR Pelvis 1-2 Views   XR Lumbar Spine 2-3 Views   XR Thoracic Spine 2 View   Sedimentation rate   C-reactive protein   Hepatitis B surface antigen   Hepatitis B core antibody, IgM   QuantiFERON-TB Gold Plus   No orders of the defined types were placed in this encounter.   Follow-Up Instructions: Return in about 4 weeks (around 10/17/2023) for New pt PsA f/u 31mo.   Matt Song, MD  Note - This record has been created using [employer].  Chart creation errors have been sought, but may not always  have been located. Such creation errors do not reflect on  the standard of medical care.

## 2023-09-22 LAB — C-REACTIVE PROTEIN: CRP: 20.4 mg/L — ABNORMAL HIGH (ref <8.0)

## 2023-09-22 LAB — SEDIMENTATION RATE: Sed Rate: 25 mm/h (ref 0–30)

## 2023-09-22 LAB — QUANTIFERON-TB GOLD PLUS
Mitogen-NIL: 8.16 [IU]/mL
NIL: 0.02 [IU]/mL
QuantiFERON-TB Gold Plus: NEGATIVE
TB1-NIL: <0 [IU]/mL
TB2-NIL: <0 [IU]/mL

## 2023-09-22 LAB — HEPATITIS B CORE ANTIBODY, IGM: Hep B C IgM: NONREACTIVE

## 2023-09-22 LAB — HEPATITIS B SURFACE ANTIGEN: Hepatitis B Surface Ag: NONREACTIVE

## 2023-10-06 ENCOUNTER — Other Ambulatory Visit: Payer: Self-pay | Admitting: Family Medicine

## 2023-10-07 ENCOUNTER — Other Ambulatory Visit: Payer: Self-pay | Admitting: Family Medicine

## 2023-10-07 ENCOUNTER — Other Ambulatory Visit: Payer: Self-pay

## 2023-10-08 ENCOUNTER — Other Ambulatory Visit: Payer: Self-pay

## 2023-10-08 ENCOUNTER — Telehealth: Payer: Self-pay

## 2023-10-08 MED FILL — Meloxicam Tab 15 MG: ORAL | 90 days supply | Qty: 90 | Fill #0 | Status: AC

## 2023-10-08 NOTE — Telephone Encounter (Signed)
 Copied from CRM [id number]. Topic: Clinical - Medication Question >> Oct 08, 2023 12:36 PM Latoya Lutz wrote: Reason for CRM: patient called to see if she could get the provider to send her script to the pharmacy she is able to pick it up as she took her last pill on yesterday.

## 2023-10-08 NOTE — Telephone Encounter (Signed)
 Medication refill request pending with E[facility]. Has been forwarded to PCP. Patient is aware.

## 2023-10-09 ENCOUNTER — Other Ambulatory Visit: Payer: Self-pay

## 2023-10-13 ENCOUNTER — Telehealth: Admitting: Physician Assistant

## 2023-10-13 ENCOUNTER — Other Ambulatory Visit: Payer: Self-pay

## 2023-10-13 DIAGNOSIS — J40 Bronchitis, not specified as acute or chronic: Secondary | ICD-10-CM | POA: Diagnosis not present

## 2023-10-13 MED ORDER — BENZONATATE 200 MG PO CAPS
200.0000 mg | ORAL_CAPSULE | Freq: Two times a day (BID) | ORAL | 0 refills | Status: DC | PRN
  Filled 2023-10-13: qty 20, 10d supply, fill #0

## 2023-10-13 MED ORDER — AZITHROMYCIN 250 MG PO TABS
ORAL_TABLET | ORAL | 0 refills | Status: AC
  Filled 2023-10-13: qty 6, 5d supply, fill #0

## 2023-10-13 NOTE — Progress Notes (Signed)
 E-Visit for Cough  We are sorry that you are not feeling well.  Here is how we plan to help!  Based on your presentation I believe you most likely have A cough due to bacteria.  When patients have a fever and a productive cough with a change in color or increased sputum production, we are concerned about bacterial bronchitis.  If left untreated it can progress to pneumonia.  If your symptoms do not improve with your treatment plan it is important that you contact your provider.   I have prescribed Azithromyin 250 mg: two tablets now and then one tablet daily for 4 additonal days    In addition you may use A prescription cough medication called Tessalon Perles 100mg . You may take 1-2 capsules every 8 hours as needed for your cough.    From your responses in the eVisit questionnaire you describe inflammation in the upper respiratory tract which is causing a significant cough.  This is commonly called Bronchitis and has four common causes:   Allergies Viral Infections Acid Reflux Bacterial Infection Allergies, viruses and acid reflux are treated by controlling symptoms or eliminating the cause. An example might be a cough caused by taking certain blood pressure medications. You stop the cough by changing the medication. Another example might be a cough caused by acid reflux. Controlling the reflux helps control the cough.  USE OF BRONCHODILATOR ("RESCUE") INHALERS: There is a risk from using your bronchodilator too frequently.  The risk is that over-reliance on a medication which only relaxes the muscles surrounding the breathing tubes can reduce the effectiveness of medications prescribed to reduce swelling and congestion of the tubes themselves.  Although you feel brief relief from the bronchodilator inhaler, your asthma may actually be worsening with the tubes becoming more swollen and filled with mucus.  This can delay other crucial treatments, such as oral steroid medications. If you need to use  a bronchodilator inhaler daily, several times per day, you should discuss this with your provider.  There are probably better treatments that could be used to keep your asthma under control.     HOME CARE Only take medications as instructed by your medical team. Complete the entire course of an antibiotic. Drink plenty of fluids and get plenty of rest. Avoid close contacts especially the very young and the elderly Cover your mouth if you cough or cough into your sleeve. Always remember to wash your hands A steam or ultrasonic humidifier can help congestion.   GET HELP RIGHT AWAY IF: You develop worsening fever. You become short of breath You cough up blood. Your symptoms persist after you have completed your treatment plan MAKE SURE YOU  Understand these instructions. Will watch your condition. Will get help right away if you are not doing well or get worse.    Thank you for choosing an e-visit.  Your e-visit answers were reviewed by a board certified advanced clinical practitioner to complete your personal care plan. Depending upon the condition, your plan could have included both over the counter or prescription medications.  Please review your pharmacy choice. Make sure the pharmacy is open so you can pick up prescription now. If there is a problem, you may contact your provider through [employer] and have the prescription routed to another pharmacy.  Your safety is important to [place]. If you have drug allergies check your prescription carefully.   For the next 24 hours you can use MyChart to ask questions about today's visit, request a  non-urgent call back, or ask for a work or school excuse. You will get an email in the next two days asking about your experience. I hope that your e-visit has been valuable and will speed your recovery.   have provided 5 minutes of non face to face time during this encounter for chart review and documentation.

## 2023-10-14 ENCOUNTER — Other Ambulatory Visit: Payer: Self-pay

## 2023-10-21 ENCOUNTER — Ambulatory Visit: Admitting: Internal Medicine

## 2023-10-21 NOTE — Progress Notes (Deleted)
 Office Visit Note  Patient: Latoya Lutz             Date of Birth: Jul 13, 1970           MRN: [id number]             PCP: Solomon Dupre, DO Referring: Solomon Dupre, DO Visit Date: 10/21/2023   Subjective:  No chief complaint on file.   History of Present Illness: Latoya Lutz is a 53 y.o. female here for follow up ***   Previous HPI 09/19/23 Latoya Peeling "Moira Andrews" is a 53 year old female with psoriasis and psoriatic arthritis who presents with joint pain and skin issues. She was referred for evaluation of joint problems and a history of psoriasis.   She experiences ongoing joint pain, particularly in her knees, which has not improved significantly with gel injections. The pain is variable, with some days being better than others. She describes aching and burning sensations in her legs and notes that her legs feel like 'jello' in the mornings, requiring effort to get moving. She takes Mobic  daily and Tylenol  twice a day for pain management, and uses Voltaren  gel twice daily. She also takes a half tablet of methocarbamol at night to help with leg pain and relaxation. She has been diagnosed with psoriatic arthritis, with symptoms including joint pain, stiffness, and swelling. She reports that her nails have been peeling recently, and she experiences fatigue and difficulty sleeping due to pain in her legs, hip, and back. She sleeps in a recliner as lying flat is uncomfortable. She has a history of knee surgery in 2007 and has been receiving cortisone shots, which are no longer effective.   She has a history of psoriasis, initially diagnosed after a severe throat infection in 2017 or 2018, which led to the removal of her second set of tonsils. Psoriasis initially appeared on her elbows and has since been managed with topical creams. She also reports pitting in her fingernails over the last two years, which she initially thought was nail fungus. She experiences rashes on  her scalp and has dry skin, which she lotions daily. No recent significant rashes on her scalp, but she has experienced them in the past. No nail changes on her toes.   She has been evaluated for vascular issues due to leg cramps and swelling, but was advised to increase electrolyte intake. She drinks electrolyte water, which helps with the cramps. She has been told she has venous reflux but no significant vascular issues were found.   She has a history of bone spurs in her knees and ankles, and her spine has shown fusion of several levels in the lower thoracic region, which she was unaware of until recently. She experiences back pain and stiffness, which affects her mobility.    No Rheumatology ROS completed.   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   Essential hypertension 09/13/2019   Acute pain of left knee 07/10/2018   Acute pain of right shoulder 07/10/2018   Hyperlipidemia 10/19/2016   Murmur 07/23/2016   Acute anxiety 03/05/2016   Dysphagia 11/14/2015   History of urinary stone 10/29/2015   History of urethral narrowing 07/16/2015   Esophageal reflux 05/25/2014   Morbid obesity (HCC) 04/10/2012    Past Medical History:  Diagnosis Date   Acid reflux  Actinic keratosis    Allergy    Arthritis    Atypical mole 10/27/2019   Left labia mucosa    Atypical nevus 05/25/2020   R labia majora - atypical proliferation   Candidal dermatitis 05/25/2014   Cervical spine disease    Chicken pox    Chronic urethral narrowing    undergoing stretching   Complication of anesthesia    Diverticulitis    Dysplastic nevus 04/05/2021   R post med thigh near popliteal, mod to severe atypia   Endometriosis    Dr. Tarri Farm at [facility], removed    Esophageal reflux 05/25/2014   Gross hematuria 04/25/2015   H/O cystoscopy    normal   Heart  murmur    Hx of basal cell carcinoma 12/29/2008   L low back   Hypertension    Infection of urinary tract 09/11/2014   Kidney stone 09/11/2014   Kidney stones    Microscopic hematuria 09/11/2014   Obesity, morbid, BMI 40.0-49.9 (HCC) 04/10/2012   PONV (postoperative nausea and vomiting)    Right ureteral stone 04/28/2015   Skin cancer    Urinary retention     Family History  Problem Relation Age of Onset   Cancer Mother        pancreatic cancer   Heart disease Mother        Pacemaker and defib   Hypertension Mother    Hyperlipidemia Mother    Early death Mother    Stroke Mother    Varicose Veins Mother    Cancer Father    COPD Father        Lung and brain cancer   Early death Father    Cancer Maternal Grandmother        Colon   Diabetes Paternal Grandmother    Cirrhosis Paternal Grandmother    Cancer Paternal Grandmother    Kidney disease Neg Hx    Bladder Cancer Neg Hx    Breast cancer Neg Hx    Past Surgical History:  Procedure Laterality Date   ABDOMINAL HYSTERECTOMY     BLADDER SURGERY  07/2003   CARPAL TUNNEL RELEASE Right    CESAREAN SECTION  1998   CHOLECYSTECTOMY     COLONOSCOPY WITH PROPOFOL  N/A 08/24/2021   Procedure: COLONOSCOPY WITH PROPOFOL ;  Surgeon: Marnee Sink, MD;  Location: [facility] ENDOSCOPY;  Service: Endoscopy;  Laterality: N/A;  9 AM ARRIVAL, PLEASE   CYSTOSCOPY W/ RETROGRADES Right 05/16/2015   Procedure: CYSTOSCOPY WITH RETROGRADE PYELOGRAM;  Surgeon: Bart Born, MD;  Location: [facility] ORS;  Service: Urology;  Laterality: Right;   CYSTOSCOPY W/ URETERAL STENT PLACEMENT Right 05/16/2015   Procedure: CYSTOSCOPY WITH STENT REPLACEMENT;  Surgeon: Bart Born, MD;  Location: [facility] ORS;  Service: Urology;  Laterality: Right;   CYSTOSCOPY WITH STENT PLACEMENT Right 04/29/2015   Procedure: CYSTOSCOPY WITH STENT PLACEMENT;  Surgeon: Bart Born, MD;  Location: [facility] ORS;  Service: Urology;  Laterality: Right;    ESOPHAGOGASTRODUODENOSCOPY N/A 08/24/2021   Procedure: ESOPHAGOGASTRODUODENOSCOPY (EGD);  Surgeon: Marnee Sink, MD;  Location: [facility] ENDOSCOPY;  Service: Endoscopy;  Laterality: N/A;   EXTRACORPOREAL SHOCK WAVE LITHOTRIPSY Left 06/02/2015   Procedure: EXTRACORPOREAL SHOCK WAVE LITHOTRIPSY (ESWL);  Surgeon: Dustin Gimenez, MD;  Location: [facility] ORS;  Service: Urology;  Laterality: Left;   FOOT SURGERY  2003   KNEE SURGERY  2005   TONSILLECTOMY     TONSILLECTOMY     lingual growth on vocal cord removal   TOTAL ABDOMINAL HYSTERECTOMY W/ BILATERAL SALPINGOOPHORECTOMY  [facility] complete   URETEROSCOPY WITH HOLMIUM LASER LITHOTRIPSY Right 05/16/2015   Procedure: URETEROSCOPY WITH HOLMIUM LASER LITHOTRIPSY;  Surgeon: Bart Born, MD;  Location: [facility] ORS;  Service: Urology;  Laterality: Right;   Social History   Social History Narrative   [place] 15[place]O and fiance      Work - [employer] History  Administered Date(s) Administered   Fluad Quad(high Dose 65+) 02/27/2021   Influenza Split 03/19/2012, 02/25/2013   Influenza,inj,Quad PF,6+ Mos 03/25/2023   Influenza-Unspecified 02/23/2014, 03/15/2015, 03/21/2016, 03/28/2017, 02/19/2018, 03/02/2019, 02/26/2022   PFIZER(Purple Top)SARS-COV-2 Vaccination 06/08/2019, 06/29/2019   Td 06/18/2022   Tdap 09/30/2012   Zoster Recombinant(Shingrix) 05/19/2021, 08/17/2021     Objective: Vital Signs: There were no vitals taken for this visit.   Physical Exam   Musculoskeletal Exam: ***  CDAI Exam: CDAI Score: -- Patient Global: --; Provider Global: -- Swollen: --; Tender: -- Joint Exam 10/21/2023   No joint exam has been documented for this visit   There is currently no information documented on the homunculus. Go to the Rheumatology activity and complete the homunculus joint exam.  Investigation: No additional findings.  Imaging: No results found.  Recent Labs: Lab Results  Component Value Date   WBC 7.5  06/27/2023   HGB 11.8 06/27/2023   PLT 230 06/27/2023   NA 143 06/27/2023   K 4.2 06/27/2023   CL 102 06/27/2023   CO2 26 06/27/2023   GLUCOSE 97 06/27/2023   BUN 16 06/27/2023   CREATININE 0.73 06/27/2023   BILITOT 0.2 06/27/2023   ALKPHOS 104 06/27/2023   AST 14 06/27/2023   ALT 12 06/27/2023   PROT 6.3 06/27/2023   ALBUMIN 4.1 06/27/2023   CALCIUM  9.1 06/27/2023   GFRAA 104 08/05/2020   QFTBGOLDPLUS NEGATIVE 09/19/2023    Speciality Comments: No specialty comments available.  Procedures:  No procedures performed Allergies: Atorvastatin    Assessment / Plan:     Visit Diagnoses: No diagnosis found.  ***  Orders: No orders of the defined types were placed in this encounter.  No orders of the defined types were placed in this encounter.    Follow-Up Instructions: No follow-ups on file.   Matt Song, MD  Note - This record has been created using [employer].  Chart creation errors have been sought, but may not always  have been located. Such creation errors do not reflect on  the standard of medical care.

## 2023-11-11 ENCOUNTER — Ambulatory Visit: Admitting: Internal Medicine

## 2023-11-12 NOTE — Progress Notes (Signed)
 Office Visit Note  Patient: Latoya Lutz             Date of Birth: 1971-05-08           MRN: [id number]             PCP: Solomon Dupre, DO Referring: Solomon Dupre, DO Visit Date: 11/13/2023   Subjective:  Follow-up   History of Present Illness: Latoya Lutz is a 53 y.o. female here for follow up for psoriatic arthritis and psoriasis. Since last visit she continues to have ongoing joint pain in multiple areas. Imaging at our previous visit demonstrated some degenerative changes in the low back and hips but not severe. Labs showed elevated markers of inflammation vonsistent with active disease. Skin disease remains about the same with scalp rashes and fingernail pitting. She has not seen new episodes of significant finger or toe swelling.  Previous HPI 09/19/23 Latoya Lutz is a 53 year old female with psoriasis and psoriatic arthritis who presents with joint pain and skin issues. She was referred for evaluation of joint problems and a history of psoriasis.   She experiences ongoing joint pain, particularly in her knees, which has not improved significantly with gel injections. The pain is variable, with some days being better than others. She describes aching and burning sensations in her legs and notes that her legs feel like 'jello' in the mornings, requiring effort to get moving. She takes Mobic  daily and Tylenol  twice a day for pain management, and uses Voltaren  gel twice daily. She also takes a half tablet of methocarbamol at night to help with leg pain and relaxation. She has been diagnosed with psoriatic arthritis, with symptoms including joint pain, stiffness, and swelling. She reports that her nails have been peeling recently, and she experiences fatigue and difficulty sleeping due to pain in her legs, hip, and back. She sleeps in a recliner as lying flat is uncomfortable. She has a history of knee surgery in 2007 and has been receiving cortisone  shots, which are no longer effective.   She has a history of psoriasis, initially diagnosed after a severe throat infection in 2017 or 2018, which led to the removal of her second set of tonsils. Psoriasis initially appeared on her elbows and has since been managed with topical creams. She also reports pitting in her fingernails over the last two years, which she initially thought was nail fungus. She experiences rashes on her scalp and has dry skin, which she lotions daily. No recent significant rashes on her scalp, but she has experienced them in the past. No nail changes on her toes.   She has been evaluated for vascular issues due to leg cramps and swelling, but was advised to increase electrolyte intake. She drinks electrolyte water, which helps with the cramps. She has been told she has venous reflux but no significant vascular issues were found.   She has a history of bone spurs in her knees and ankles, and her spine has shown fusion of several levels in the lower thoracic region, which she was unaware of until recently. She experiences back pain and stiffness, which affects her mobility.    Review of Systems  Constitutional:  Positive for fatigue.  HENT:  Positive for mouth sores and mouth dryness.   Eyes:  Negative for dryness.  Respiratory:  Positive for shortness of breath.   Cardiovascular:  Negative for chest pain and palpitations.  Gastrointestinal:  Positive for constipation and diarrhea.  Negative for blood in stool.  Endocrine: Positive for increased urination.  Genitourinary:  Negative for involuntary urination.  Musculoskeletal:  Positive for joint pain, gait problem, joint pain, joint swelling, myalgias, muscle weakness, morning stiffness, muscle tenderness and myalgias.  Skin:  Positive for color change, rash and hair loss. Negative for sensitivity to sunlight.  Allergic/Immunologic: Positive for susceptible to infections.  Neurological:  Negative for dizziness and headaches.   Hematological:  Negative for swollen glands.  Psychiatric/Behavioral:  Positive for sleep disturbance. Negative for depressed mood. The patient is not nervous/anxious.     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   Essential hypertension 09/13/2019   Acute pain of left knee 07/10/2018   Acute pain of right shoulder 07/10/2018   Hyperlipidemia 10/19/2016   Murmur 07/23/2016   Acute anxiety 03/05/2016   Dysphagia 11/14/2015   History of urinary stone 10/29/2015   History of urethral narrowing 07/16/2015   Esophageal reflux 05/25/2014   Morbid obesity (HCC) 04/10/2012    Past Medical History:  Diagnosis Date   Acid reflux    Actinic keratosis    Allergy    Arthritis    Atypical mole 10/27/2019   Left labia mucosa    Atypical nevus 05/25/2020   R labia majora - atypical proliferation   Candidal dermatitis 05/25/2014   Cervical spine disease    Chicken pox    Chronic urethral narrowing    undergoing stretching   Complication of anesthesia    Diverticulitis    Dysplastic nevus 04/05/2021   R post med thigh near popliteal, mod to severe atypia   Endometriosis    Dr. Tarri Farm at [facility], removed    Esophageal reflux 05/25/2014   Gross hematuria 04/25/2015   H/O cystoscopy    normal   Heart murmur    Hx of basal cell carcinoma 12/29/2008   L low back   Hypertension    Infection of urinary tract 09/11/2014   Kidney stone 09/11/2014   Kidney stones    Microscopic hematuria 09/11/2014   Obesity, morbid, BMI 40.0-49.9 (HCC) 04/10/2012   PONV (postoperative nausea and vomiting)    Right ureteral stone 04/28/2015   Skin cancer    Urinary retention     Family History  Problem Relation Age of Onset   Cancer Mother        pancreatic cancer   Heart disease Mother        Pacemaker and defib   Hypertension Mother     Hyperlipidemia Mother    Early death Mother    Stroke Mother    Varicose Veins Mother    Cancer Father    COPD Father        Lung and brain cancer   Early death Father    Cancer Maternal Grandmother        Colon   Diabetes Paternal Grandmother    Cirrhosis Paternal Grandmother    Cancer Paternal Grandmother    Kidney disease Neg Hx    Bladder Cancer Neg Hx    Breast cancer Neg Hx    Past Surgical History:  Procedure Laterality Date   ABDOMINAL HYSTERECTOMY     BLADDER SURGERY  07/2003   CARPAL TUNNEL RELEASE Right    CESAREAN SECTION  1998   CHOLECYSTECTOMY     COLONOSCOPY WITH PROPOFOL  N/A 08/24/2021   Procedure: COLONOSCOPY WITH  PROPOFOL ;  Surgeon: Marnee Sink, MD;  Location: [facility] ENDOSCOPY;  Service: Endoscopy;  Laterality: N/A;  9 AM ARRIVAL, PLEASE   CYSTOSCOPY W/ RETROGRADES Right 05/16/2015   Procedure: CYSTOSCOPY WITH RETROGRADE PYELOGRAM;  Surgeon: Bart Born, MD;  Location: [facility] ORS;  Service: Urology;  Laterality: Right;   CYSTOSCOPY W/ URETERAL STENT PLACEMENT Right 05/16/2015   Procedure: CYSTOSCOPY WITH STENT REPLACEMENT;  Surgeon: Bart Born, MD;  Location: [facility] ORS;  Service: Urology;  Laterality: Right;   CYSTOSCOPY WITH STENT PLACEMENT Right 04/29/2015   Procedure: CYSTOSCOPY WITH STENT PLACEMENT;  Surgeon: Bart Born, MD;  Location: [facility] ORS;  Service: Urology;  Laterality: Right;   ESOPHAGOGASTRODUODENOSCOPY N/A 08/24/2021   Procedure: ESOPHAGOGASTRODUODENOSCOPY (EGD);  Surgeon: Marnee Sink, MD;  Location: [facility] ENDOSCOPY;  Service: Endoscopy;  Laterality: N/A;   EXTRACORPOREAL SHOCK WAVE LITHOTRIPSY Left 06/02/2015   Procedure: EXTRACORPOREAL SHOCK WAVE LITHOTRIPSY (ESWL);  Surgeon: Dustin Gimenez, MD;  Location: [facility] ORS;  Service: Urology;  Laterality: Left;   FOOT SURGERY  2003   KNEE SURGERY  2005   TONSILLECTOMY     TONSILLECTOMY     lingual growth on vocal cord removal   TOTAL ABDOMINAL HYSTERECTOMY W/ BILATERAL  SALPINGOOPHORECTOMY     [facility] complete   URETEROSCOPY WITH HOLMIUM LASER LITHOTRIPSY Right 05/16/2015   Procedure: URETEROSCOPY WITH HOLMIUM LASER LITHOTRIPSY;  Surgeon: Bart Born, MD;  Location: [facility] ORS;  Service: Urology;  Laterality: Right;   Social History   Social History Narrative   Lives in [place] with son 15YO and fiance      Work - [employer] History  Administered Date(s) Administered   Fluad Quad(high Dose 65+) 02/27/2021   Influenza Split 03/19/2012, 02/25/2013   Influenza,inj,Quad PF,6+ Mos 03/25/2023   Influenza-Unspecified 02/23/2014, 03/15/2015, 03/21/2016, 03/28/2017, 02/19/2018, 03/02/2019, 02/26/2022   PFIZER(Purple Top)SARS-COV-2 Vaccination 06/08/2019, 06/29/2019   Td 06/18/2022   Tdap 09/30/2012   Zoster Recombinant(Shingrix) 05/19/2021, 08/17/2021     Objective: Vital Signs: BP 130/78 (BP Location: Left Arm, Patient Position: Sitting, Cuff Size: Large)   Pulse 79   Resp 14   Ht 5' 6 (1.676 m)   Wt (!) 302 lb (137 kg)   BMI 48.74 kg/m    Physical Exam Constitutional:      Appearance: She is obese.   Eyes:     Conjunctiva/sclera: Conjunctivae normal.    Cardiovascular:     Rate and Rhythm: Normal rate and regular rhythm.  Pulmonary:     Effort: Pulmonary effort is normal.     Breath sounds: Normal breath sounds.   Skin:    General: Skin is warm and dry.     Comments: Nail pitting   Neurological:     Mental Status: She is alert.   Psychiatric:        Mood and Affect: Mood normal.      Musculoskeletal Exam:  Shoulders full ROM no tenderness or swelling Elbows full ROM no tenderness or swelling Wrists full ROM no tenderness or swelling Fingers full ROM no tenderness or swelling No paraspinal tenderness to palpation over upper and lower back Hip normal internal and external rotation, lateral hip tenderness to pressure Knees full ROM no tenderness or swelling Ankles full ROM no tenderness or  swelling   Investigation: No additional findings.  Imaging: No results found.  Recent Labs: Lab Results  Component Value Date   WBC 7.5 06/27/2023   HGB 11.8 06/27/2023   PLT 230 06/27/2023   NA 143 06/27/2023  K 4.2 06/27/2023   CL 102 06/27/2023   CO2 26 06/27/2023   GLUCOSE 97 06/27/2023   BUN 16 06/27/2023   CREATININE 0.73 06/27/2023   BILITOT 0.2 06/27/2023   ALKPHOS 104 06/27/2023   AST 14 06/27/2023   ALT 12 06/27/2023   PROT 6.3 06/27/2023   ALBUMIN 4.1 06/27/2023   CALCIUM  9.1 06/27/2023   GFRAA 104 08/05/2020   QFTBGOLDPLUS NEGATIVE 09/19/2023    Speciality Comments: No specialty comments available.  Procedures:  No procedures performed Allergies: Atorvastatin    Assessment / Plan:     Psoriatic arthritis (HCC) Chronic psoriatic arthritis with joint pain, stiffness, swelling, nail pitting, and peeling. Current treatment inadequate. Biologics may be necessary for axial involvement or significant joint damage. Do not expect she would tolerate otezla well with more likely GI side effects GI intolerance of oral NSAIDs, topicals only slight benefit. - Plan to start Tremfya Gilbert for psoriatic arthritis and mildly active psoriasis - Continue PRN voltaren  for arthritis pain   Psoriasis Chronic psoriasis managed with topical treatments. - Continue current topical treatments for psoriasis.   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 Extensive enthesophytes on imaging likely related to PsA Hx.   High risk medication use Reviewed risks of Tremfya including injection reactions, infections, need for regular monitoring. No personal history of serious infections or other complications. Baseline labs at previous visit negative for TB screening.   Venous Reflux Venous  reflux causing leg swelling and discomfort.  Orders: No orders of the defined types were placed in this encounter.  No orders of the defined types were placed in this encounter.    Follow-Up Instructions: Return in about 3 months (around 02/13/2024) for PsA/PsO GUS start f/u 3mos.   Matt Song, MD  Note - This record has been created using [employer].  Chart creation errors have been sought, but may not always  have been located. Such creation errors do not reflect on  the standard of medical care.

## 2023-11-13 ENCOUNTER — Ambulatory Visit: Attending: Internal Medicine | Admitting: Internal Medicine

## 2023-11-13 ENCOUNTER — Encounter: Payer: Self-pay | Admitting: Internal Medicine

## 2023-11-13 VITALS — BP 130/78 | HR 79 | Resp 14 | Ht 66.0 in | Wt 302.0 lb

## 2023-11-13 DIAGNOSIS — L4050 Arthropathic psoriasis, unspecified: Secondary | ICD-10-CM

## 2023-11-13 NOTE — Patient Instructions (Signed)
 Guselkumab Injection What is this medication? GUSELKUMAB (goo ZELK ue mab) treats autoimmune conditions, such as arthritis, psoriasis, and ulcerative colitis. It works by slowing down an overactive immune system.  It is a monoclonal antibody. This medicine may be used for other purposes; ask your health care provider or pharmacist if you have questions. COMMON BRAND NAME(S): Pecola Leisure PEN What should I tell my care team before I take this medication? They need to know if you have any of these conditions: Immune system problems Infection, such as a virus infection, chickenpox, cold sores, herpes, or a history of infections Recently received or scheduled to receive a vaccine Tuberculosis, a positive skin test for tuberculosis, or recent close contact with someone who has tuberculosis An unusual or allergic reaction to guselkumab, other medications, foods, dyes, or preservatives Pregnant or trying to get pregnant Breast-feeding How should I use this medication? This medication is injected under the skin. It is usually given by a care team in a hospital or clinic setting. It may also be given at home. If you get this medication at home, you will be taught how to prepare and give it. Use exactly as directed. Take it as directed on the prescription label. Keep taking it unless your care team tells you to stop. It is important that you put your used needles and syringes in a special sharps container. Do not put them in a trash can. If you do not have a sharps container, call your pharmacist or care team to get one. This medication comes with INSTRUCTIONS FOR USE. Ask your pharmacist for directions on how to use this medication. Read the information carefully. Talk to your pharmacist or care team if you have questions. A special MedGuide will be given to you by the pharmacist with each prescription and refill. Be sure to read this information carefully each time. Talk to your care team about the  use of this medication in children. Special care may be needed. Overdosage: If you think you have taken too much of this medicine contact a poison control center or emergency room at once. NOTE: This medicine is only for you. Do not share this medicine with others. What if I miss a dose? If you get this medication at a hospital or clinic: It is important not to miss your dose. Call your care team if you are unable to keep an appointment. If you give yourself this medication at home: If you miss a dose, take it as soon as you can. Then continue your normal schedule. If it is almost time for your next dose, take only that dose. Do not take double or extra doses. Call your care team with questions. What may interact with this medication? Do not take this medication with any of the following: Live virus vaccines This medication may also interact with the following: Amoxapine Certain medications for depression, anxiety, or mental health conditions, such as amitriptyline, clomipramine, desipramine, doxepin, imipramine, maprotiline, nortriptyline, protriptyline, trimipramine Codeine Inactivated vaccines Methadone Pimozide Thioridazine This list may not describe all possible interactions. Give your health care provider a list of all the medicines, herbs, non-prescription drugs, or dietary supplements you use. Also tell them if you smoke, drink alcohol, or use illegal drugs. Some items may interact with your medicine. What should I watch for while using this medication? Your condition will be monitored carefully while you are receiving this medication. Tell your care team if your symptoms do not start to get better or if they get worse.  You will be tested for tuberculosis (TB) before you start this medication. If your care team prescribes any medication for TB, you should start taking the TB medication before starting this medication. Make sure to finish the full course of TB medication. This medication  may increase your risk of getting an infection. Call your care team for advice if you get a fever, chills, sore throat, or other symptoms of a cold or flu. Do not treat yourself. Try to avoid being around people who are sick. What side effects may I notice from receiving this medication? Side effects that you should report to your care team as soon as possible: Allergic reactions--skin rash, itching, hives, swelling of the face, lips, tongue, or throat Infection--fever, chills, cough, sore throat, wounds that don't heal, pain or trouble when passing urine, general feeling of discomfort or being unwell Side effects that usually do not require medical attention (report to your care team if they continue or are bothersome): Diarrhea Headache Joint pain Pain, redness, or irritation at injection site Runny or stuffy nose Sore throat This list may not describe all possible side effects. Call your doctor for medical advice about side effects. You may report side effects to FDA at 1-800-FDA-1088. Where should I keep my medication? Keep out of the reach of children and pets. Store in the refrigerator. Do not freeze. Keep it in the original container until you are ready to use. Protect from light. Do not shake. Remove the dose from the refrigerator about 30 minutes prior to use. Get rid of any unused medication after the expiration date on the label. To get rid of medications that are no longer needed or have expired: Take the medication to a medication take-back program. Check with your pharmacy or law enforcement to find a location. If you cannot return the medication, ask your pharmacist or care team how to get rid of this medication safely. NOTE: This sheet is a summary. It may not cover all possible information. If you have questions about this medicine, talk to your doctor, pharmacist, or health care provider.  2024 Elsevier/Gold Standard (2023-05-10 00:00:00)

## 2023-11-14 ENCOUNTER — Telehealth: Payer: Self-pay

## 2023-11-14 DIAGNOSIS — L409 Psoriasis, unspecified: Secondary | ICD-10-CM

## 2023-11-14 DIAGNOSIS — L4050 Arthropathic psoriasis, unspecified: Secondary | ICD-10-CM

## 2023-11-14 DIAGNOSIS — Z79899 Other long term (current) drug therapy: Secondary | ICD-10-CM

## 2023-11-14 NOTE — Telephone Encounter (Signed)
 Pending OV note from 11/13/2023, patient is Tremfya new start  Latoya Lutz, PharmD, MPH, BCPS, CPP Clinical Pharmacist (Rheumatology and Pulmonology)

## 2023-11-15 ENCOUNTER — Telehealth: Payer: Self-pay | Admitting: Internal Medicine

## 2023-11-15 NOTE — Telephone Encounter (Signed)
 Patient called checking if her Tremfya medication has been approved.  Patient requested a return call.

## 2023-11-15 NOTE — Telephone Encounter (Signed)
 She saw Dr. Rodell Citrin on 11/13/2023. This takes about a week to work through specialty pharmacy process

## 2023-11-21 ENCOUNTER — Encounter: Payer: Commercial Managed Care - PPO | Admitting: Internal Medicine

## 2023-11-21 NOTE — Telephone Encounter (Signed)
 PA pending OV note to be signed by Dr. Rodell Citrin

## 2023-11-22 ENCOUNTER — Encounter: Payer: Commercial Managed Care - PPO | Admitting: Internal Medicine

## 2023-11-25 ENCOUNTER — Other Ambulatory Visit: Payer: Self-pay

## 2023-11-25 NOTE — Telephone Encounter (Signed)
 Submitted an urgent Prior Authorization request to [employer] for [facility] via CoverMyMeds. Will update once we receive a response.  Key: Latoya Lutz

## 2023-11-26 MED ORDER — TREMFYA ONE-PRESS 100 MG/ML ~~LOC~~ SOAJ: SUBCUTANEOUS | 0 refills | Status: DC

## 2023-11-26 NOTE — Telephone Encounter (Signed)
 Received notification from [facility] regarding a prior authorization for [facility]. Authorization has been APPROVED from 11/25/23 to 12/25/23. Approval letter sent to scan center.  Patient must fill through [facility]: [phone]   Authorization # [id number]  Enrolled patient into Tremfya copay card: BIN: [id number] Group: [id number] ID: [id number]  Patient scheduled for Tremfya new start on 12/02/23. Rx sent to [facility] for onboarding. Patient aware that Demaris Fillers will reach out to coordinate shipment  Geraldene Kleine, PharmD, MPH, BCPS, CPP Clinical Pharmacist (Rheumatology and Pulmonology)

## 2023-11-27 ENCOUNTER — Other Ambulatory Visit (HOSPITAL_COMMUNITY): Payer: Self-pay

## 2023-11-27 ENCOUNTER — Telehealth: Payer: Self-pay

## 2023-11-27 NOTE — Telephone Encounter (Signed)
 Upon attempting to run test claim for copay card we received a message stating that coverage was terminated as of 11/27/23 despite having just enrolled into the program the day before.  Contacted [employer] for assistance. Per rep, the pt's status in the program is apparently not verified at the time of enrollment and pt was subsequently transitioned into a different program that comes with updated processing information and will additionally require acquisition of a virtual debit card.  I was transferred to an entirely different department and informed that the information had not populated yet. I was advised that pt should personally try calling back tomorrow, that way she would be able to receive the new processing information AND virtual debit card all at once. Will send pt a MyChart message with instructions.

## 2023-11-27 NOTE — Telephone Encounter (Signed)
 IF needed, can use Tremfya sample at new start visit

## 2023-11-28 NOTE — Telephone Encounter (Signed)
 Pt reached out to me this morning, she tells me that she has been attempting to contact the copay card company since 8am this morning without success. I advised that they are most likely closed in observance of Juneteenth as this seems to be a common occurrence with these kinds of companies. She verbalized understanding and will attempt to try again tomorrow morning.  I informed her of plan to simply use a sample during her appointment and explained that we would coordinate the first fill to be delivered directly to her house. She once again verbalized understanding.   Will await her f/u tomorrow.

## 2023-11-29 ENCOUNTER — Other Ambulatory Visit (HOSPITAL_COMMUNITY): Payer: Self-pay

## 2023-11-29 ENCOUNTER — Telehealth: Payer: Self-pay | Admitting: Internal Medicine

## 2023-11-29 NOTE — Patient Instructions (Incomplete)
 Your next TREMFYA  dose is due on 12/30/23 then every 8 weeks thereafter (starting on 02/24/24)  HOLD TREMFYA  if you have signs or symptoms of an infection. You can resume once you feel better or back to your baseline. HOLD TREMFYA  if you start antibiotics to treat an infection. HOLD TREMFYA  around the time of surgery/procedures. Your surgeon will be able to provide recommendations on when to hold BEFORE and when you are cleared to RESUME.  Pharmacy information: Your prescription will be shipped from [facility]. Their phone number is [phone] They will call to schedule shipment and confirm address. They will mail your medication to your home.  Labs are due in 1 month then every 3 months. Lab hours are from Monday to Thursday 8am-12:30pm and 1pm-4pm and Friday 8am-12pm. You do not need an appointment if you come for labs during these times. If you'd like to go to a [facility] or [facility] closer to home, please call our clinic 48 hours prior to lab date so we can release orders in a timely manner.  Stay up to date on all routine vaccines: influenza, pneumonia, COVID19, Shingles  How to manage an injection site reaction: Remember the 5 C's: COUNTER - leave on the counter at least 30 minutes but up to overnight to bring medication to room temperature. This may help prevent stinging COLD - place something cold (like an ice gel pack or cold water bottle) on the injection site just before cleansing with alcohol. This may help reduce pain CLARITIN - use Claritin (generic name is loratadine) for the first two weeks of treatment or the day of, the day before, and the day after injecting. This will help to minimize injection site reactions CORTISONE CREAM - apply if injection site is irritated and itching CALL ME - if injection site reaction is bigger than the size of your fist, looks infected, blisters, or if you develop hives

## 2023-11-29 NOTE — Telephone Encounter (Signed)
 Pt provided updated copay card info over the phone:  BIN: [id number] RxGRP: [id number] ID: [id number]  Copay card info has been added to [facility] and debit card info has been forwarded to Cam at [facility].

## 2023-11-29 NOTE — Telephone Encounter (Signed)
 Pt called wanting to know if she is still okay to come in Monday 12/02/23 to see the pharmacist and use the sample. Pt stated she was confused that we had samples and wanted to double check.

## 2023-11-29 NOTE — Progress Notes (Unsigned)
 Pharmacy Note  Subjective:   Patient presents to clinic today to receive first dose of Tremfya for psoriatic arthritis and psoriasis.   Patient running a fever or have signs/symptoms of infection? {yes/no:20286}  Patient currently on antibiotics for the treatment of infection? {yes/no:20286}  Patient have any upcoming invasive procedures/surgeries? {yes/no:20286}  Objective: CMP     Component Value Date/Time   NA 143 06/27/2023 1441   NA 143 04/15/2012 1716   K 4.2 06/27/2023 1441   K 4.0 04/15/2012 1716   CL 102 06/27/2023 1441   CL 107 04/15/2012 1716   CO2 26 06/27/2023 1441   CO2 29 04/15/2012 1716   GLUCOSE 97 06/27/2023 1441   GLUCOSE 102 (H) 09/26/2018 1257   GLUCOSE 85 04/15/2012 1716   BUN 16 06/27/2023 1441   BUN 12 04/15/2012 1716   CREATININE 0.73 06/27/2023 1441   CREATININE 0.79 04/15/2012 1716   CALCIUM  9.1 06/27/2023 1441   CALCIUM  9.2 04/15/2012 1716   PROT 6.3 06/27/2023 1441   PROT 7.8 04/15/2012 1716   ALBUMIN 4.1 06/27/2023 1441   ALBUMIN 3.9 04/15/2012 1716   AST 14 06/27/2023 1441   AST 24 04/15/2012 1716   ALT 12 06/27/2023 1441   ALT 33 04/15/2012 1716   ALKPHOS 104 06/27/2023 1441   ALKPHOS 109 04/15/2012 1716   BILITOT 0.2 06/27/2023 1441   BILITOT 0.2 04/15/2012 1716   GFRNONAA 90 08/05/2020 1621   GFRNONAA >60 04/15/2012 1716   GFRAA 104 08/05/2020 1621   GFRAA >60 04/15/2012 1716    CBC    Component Value Date/Time   WBC 7.5 06/27/2023 1441   WBC 6.8 09/26/2018 1257   RBC 4.37 06/27/2023 1441   RBC 4.42 09/26/2018 1257   HGB 11.8 06/27/2023 1441   HCT 36.9 06/27/2023 1441   PLT 230 06/27/2023 1441   MCV 84 06/27/2023 1441   MCV 86 04/15/2012 1716   MCH 27.0 06/27/2023 1441   MCH 26.9 09/26/2018 1257   MCHC 32.0 06/27/2023 1441   MCHC 31.6 09/26/2018 1257   RDW 13.7 06/27/2023 1441   RDW 14.1 04/15/2012 1716   LYMPHSABS 2.5 06/27/2023 1441   LYMPHSABS 3.3 04/15/2012 1716   MONOABS 0.4 09/26/2018 1257   MONOABS 0.4  04/15/2012 1716   EOSABS 0.1 06/27/2023 1441   EOSABS 0.2 04/15/2012 1716   BASOSABS 0.0 06/27/2023 1441   BASOSABS 0.0 04/15/2012 1716    Baseline Immunosuppressant Therapy Labs TB GOLD    Latest Ref Rng & Units 09/19/2023    2:40 PM  Quantiferon TB Gold  Quantiferon TB Gold Plus NEGATIVE NEGATIVE    Hepatitis Panel    Latest Ref Rng & Units 09/19/2023    2:40 PM  Hepatitis  Hep B Surface Ag NON-REACTIVE NON-REACTIVE   Hep B IgM NON-REACTIVE NON-REACTIVE    HIV Lab Results  Component Value Date   HIV Negative 04/11/2009   Immunoglobulins   SPEP    Latest Ref Rng & Units 06/27/2023    2:41 PM  Serum Protein Electrophoresis  Total Protein 6.0 - 8.5 g/dL 6.3    Chest x-ray: 06/11/9145 - No active cardiopulmonary disease.  Assessment/Plan:  Reviewed importance of holding Tremfya with signs/symptoms of an infections, if antibiotics are prescribed to treat an active infection, and with invasive procedures  Demonstrated proper injection technique with Tremfya One-Press demo device  Patient able to demonstrate proper injection technique using the teach back method.  Patient self injected in the {injsitedsg:28167} with:  Sample Medication: Tremfya 100mg /mL  One-Press pen injector NDC: *** Lot: *** Expiration: ***  Patient tolerated well.  Observed for 30 mins in office for adverse reaction. {injectionreaction:30756}  Patient is to return in 1 month for labs and 6-8 weeks for follow-up appointment.  Standing orders for CBC/CMP placed.  TB gold will be monitored yearly.   Tremfya approved through insurance .   Rx sent to: [facility]: [phone] .  Patient provided with pharmacy phone number and advised to call later this week to schedule shipment to home.  Patient will continue Tremfya 100mg  subcut at Week 0 (administered in office), Week 4, then every 8 weeks thereafter.  All questions encouraged and answered.  Instructed patient to call with any  further questions or concerns.  Geraldene Kleine, PharmD, MPH, BCPS, CPP Clinical Pharmacist (Rheumatology and Pulmonology)  11/29/2023 2:53 PM

## 2023-11-29 NOTE — Telephone Encounter (Signed)
 Patient should keep Monday appointment. MyChart message sent to patient

## 2023-12-02 ENCOUNTER — Ambulatory Visit: Attending: Internal Medicine | Admitting: Pharmacist

## 2023-12-02 DIAGNOSIS — L4050 Arthropathic psoriasis, unspecified: Secondary | ICD-10-CM | POA: Diagnosis not present

## 2023-12-02 DIAGNOSIS — L409 Psoriasis, unspecified: Secondary | ICD-10-CM | POA: Diagnosis not present

## 2023-12-02 DIAGNOSIS — Z7189 Other specified counseling: Secondary | ICD-10-CM | POA: Diagnosis not present

## 2023-12-02 DIAGNOSIS — Z79899 Other long term (current) drug therapy: Secondary | ICD-10-CM | POA: Diagnosis not present

## 2023-12-02 MED ORDER — TREMFYA ONE-PRESS 100 MG/ML ~~LOC~~ SOAJ
100.0000 mg | SUBCUTANEOUS | 0 refills | Status: DC
  Filled 2023-12-09 – 2024-01-27 (×2): qty 1, 56d supply, fill #0

## 2023-12-02 MED ORDER — TREMFYA ONE-PRESS 100 MG/ML ~~LOC~~ SOAJ
SUBCUTANEOUS | 0 refills | Status: DC
  Filled 2023-12-09: qty 1, 28d supply, fill #0

## 2023-12-05 ENCOUNTER — Other Ambulatory Visit: Payer: Self-pay | Admitting: Pharmacist

## 2023-12-05 NOTE — Progress Notes (Signed)
 Patient started Tremfya  for PsA+PsO in office on 12/02/2023. Has noticeable swelling in left leg and report pain limiting her mobility  Patient's husband injected in the back of left arm  Patient will continue Tremfya  100mg  subcut at Week 0 (administered in office), Week 4, then every 8 weeks thereafter.  Sherry Pennant, PharmD, MPH, BCPS, CPP Clinical Pharmacist (Rheumatology and Pulmonology)

## 2023-12-06 ENCOUNTER — Other Ambulatory Visit (HOSPITAL_COMMUNITY): Payer: Self-pay

## 2023-12-09 ENCOUNTER — Other Ambulatory Visit: Payer: Self-pay

## 2023-12-09 NOTE — Progress Notes (Signed)
 Specialty Pharmacy Initial Fill Coordination Note  Latoya Lutz is a 53 y.o. female contacted today regarding initial fill of specialty medication(s) Guselkumab  (Tremfya  One-Press)   Patient requested Delivery   Delivery date: 12/11/23   Verified address: [address]   [place]  [phone]   Medication will be filled on 12/10/23.   Patient is aware of $0 copayment.

## 2023-12-10 ENCOUNTER — Other Ambulatory Visit: Payer: Self-pay

## 2023-12-10 NOTE — Progress Notes (Signed)
 Patient sent MyChart message with [employer] debit card info. Sent to Rai via Teams today.  [place] had sent to Cam as well  Sherry Pennant, PharmD, MPH, BCPS, CPP Clinical Pharmacist (Rheumatology and Pulmonology)

## 2023-12-24 NOTE — Addendum Note (Signed)
 Addended by: DAYNE SHERRY RAMAN on: 12/24/2023 08:26 AM   Modules accepted: Orders

## 2023-12-30 ENCOUNTER — Other Ambulatory Visit
Admission: RE | Admit: 2023-12-30 | Discharge: 2023-12-30 | Disposition: A | Discharge status: Home / Self Care | Attending: Internal Medicine | Admitting: Internal Medicine

## 2023-12-30 DIAGNOSIS — Z0000 Encounter for general adult medical examination without abnormal findings: Secondary | ICD-10-CM | POA: Diagnosis not present

## 2023-12-30 LAB — COMPREHENSIVE METABOLIC PANEL WITH GFR
ALT: 13 U/L (ref 0–44)
AST: 15 U/L (ref 15–41)
Albumin: 3.8 g/dL (ref 3.5–5.0)
Alkaline Phosphatase: 83 U/L (ref 38–126)
Anion gap: 9 (ref 5–15)
BUN: 18 mg/dL (ref 6–20)
CO2: 29 mmol/L (ref 22–32)
Calcium: 9.3 mg/dL (ref 8.9–10.3)
Chloride: 102 mmol/L (ref 98–111)
Creatinine, Ser: 0.81 mg/dL (ref 0.44–1.00)
GFR, Estimated: >60 mL/min (ref >60)
Glucose, Bld: 94 mg/dL (ref 70–99)
Potassium: 3.7 mmol/L (ref 3.5–5.1)
Sodium: 140 mmol/L (ref 135–145)
Total Bilirubin: 0.3 mg/dL (ref 0.0–1.2)
Total Protein: 6.8 g/dL (ref 6.5–8.1)

## 2023-12-30 LAB — CBC WITH DIFFERENTIAL/PLATELET
Abs Immature Granulocytes: 0.01 K/uL (ref 0.00–0.07)
Basophils Absolute: 0.1 K/uL (ref 0.0–0.1)
Basophils Relative: 1 %
Eosinophils Absolute: 0.2 K/uL (ref 0.0–0.5)
Eosinophils Relative: 3 %
HCT: 37.2 % (ref 36.0–46.0)
Hemoglobin: 12 g/dL (ref 12.0–15.0)
Immature Granulocytes: 0 %
Lymphocytes Relative: 35 %
Lymphs Abs: 2.5 K/uL (ref 0.7–4.0)
MCH: 26.4 pg (ref 26.0–34.0)
MCHC: 32.3 g/dL (ref 30.0–36.0)
MCV: 81.8 fL (ref 80.0–100.0)
Monocytes Absolute: 0.6 K/uL (ref 0.1–1.0)
Monocytes Relative: 8 %
Neutro Abs: 3.8 K/uL (ref 1.7–7.7)
Neutrophils Relative %: 53 %
Platelets: 248 K/uL (ref 150–400)
RBC: 4.55 MIL/uL (ref 3.87–5.11)
RDW: 13.9 % (ref 11.5–15.5)
WBC: 7.2 K/uL (ref 4.0–10.5)
nRBC: 0 % (ref 0.0–0.2)

## 2023-12-30 MED FILL — Meloxicam Tab 15 MG: ORAL | 90 days supply | Qty: 90 | Fill #1 | Status: AC

## 2024-01-01 NOTE — Addendum Note (Signed)
 Addended by: DAYNE SHERRY RAMAN on: 01/01/2024 10:09 AM   Modules accepted: Level of Service

## 2024-01-03 ENCOUNTER — Ambulatory Visit (INDEPENDENT_AMBULATORY_CARE_PROVIDER_SITE_OTHER): Payer: Self-pay | Admitting: Family Medicine

## 2024-01-03 ENCOUNTER — Other Ambulatory Visit: Payer: Self-pay

## 2024-01-03 ENCOUNTER — Encounter: Payer: Self-pay | Admitting: Family Medicine

## 2024-01-03 VITALS — BP 138/76 | HR 81 | Temp 97.6°F | Ht 66.0 in | Wt 300.0 lb

## 2024-01-03 DIAGNOSIS — E782 Mixed hyperlipidemia: Secondary | ICD-10-CM

## 2024-01-03 DIAGNOSIS — Z1231 Encounter for screening mammogram for malignant neoplasm of breast: Secondary | ICD-10-CM | POA: Diagnosis not present

## 2024-01-03 DIAGNOSIS — L4050 Arthropathic psoriasis, unspecified: Secondary | ICD-10-CM

## 2024-01-03 DIAGNOSIS — Z0000 Encounter for general adult medical examination without abnormal findings: Secondary | ICD-10-CM

## 2024-01-03 DIAGNOSIS — I10 Essential (primary) hypertension: Secondary | ICD-10-CM

## 2024-01-03 LAB — BAYER DCA HB A1C WAIVED: HB A1C (BAYER DCA - WAIVED): 5.7 % — ABNORMAL HIGH (ref 4.8–5.6)

## 2024-01-03 LAB — MICROALBUMIN, URINE WAIVED
Creatinine, Urine Waived: 100 mg/dL (ref 10–300)
Microalb, Ur Waived: 30 mg/L — ABNORMAL HIGH (ref 0–19)
Microalb/Creat Ratio: <30 mg/g (ref <30)

## 2024-01-03 MED ORDER — SIMETHICONE 180 MG PO CAPS
1.0000 | ORAL_CAPSULE | Freq: Three times a day (TID) | ORAL | 6 refills | Status: AC
  Filled 2024-01-03: qty 90, 30d supply, fill #0

## 2024-01-03 MED ORDER — BUSPIRONE HCL 10 MG PO TABS
10.0000 mg | ORAL_TABLET | Freq: Three times a day (TID) | ORAL | 6 refills | Status: DC
  Filled 2024-01-03: qty 90, 30d supply, fill #0

## 2024-01-03 MED ORDER — HYDROCHLOROTHIAZIDE 25 MG PO TABS
25.0000 mg | ORAL_TABLET | Freq: Every day | ORAL | 1 refills | Status: DC
  Filled 2024-01-03: qty 180, 90d supply, fill #0
  Filled 2024-06-27: qty 180, 90d supply, fill #1

## 2024-01-03 MED ORDER — PANTOPRAZOLE SODIUM 40 MG PO TBEC
80.0000 mg | DELAYED_RELEASE_TABLET | Freq: Every day | ORAL | 1 refills | Status: DC
  Filled 2024-01-03 – 2024-01-26 (×2): qty 180, 90d supply, fill #0
  Filled 2024-04-25: qty 180, 90d supply, fill #1

## 2024-01-03 MED ORDER — EZETIMIBE 10 MG PO TABS
10.0000 mg | ORAL_TABLET | Freq: Every day | ORAL | 1 refills | Status: DC
  Filled 2024-01-03: qty 90, 90d supply, fill #0
  Filled 2024-03-28: qty 90, 90d supply, fill #1

## 2024-01-03 MED ORDER — PRAVASTATIN SODIUM 20 MG PO TABS
20.0000 mg | ORAL_TABLET | ORAL | 0 refills | Status: DC
  Filled 2024-01-03: qty 156, 364d supply, fill #0
  Filled 2024-02-16: qty 36, 84d supply, fill #0
  Filled 2024-05-09: qty 36, 84d supply, fill #1

## 2024-01-03 MED ORDER — MELOXICAM 15 MG PO TABS
15.0000 mg | ORAL_TABLET | Freq: Every day | ORAL | 1 refills | Status: DC
  Filled 2024-01-03 – 2024-03-28 (×2): qty 90, 90d supply, fill #0
  Filled 2024-06-28: qty 90, 90d supply, fill #1

## 2024-01-03 MED ORDER — ACYCLOVIR 400 MG PO TABS
400.0000 mg | ORAL_TABLET | Freq: Three times a day (TID) | ORAL | 1 refills | Status: DC
  Filled 2024-01-03: qty 270, 90d supply, fill #0

## 2024-01-03 NOTE — Assessment & Plan Note (Signed)
 Encouraged diet and exercise with goal of losing 1-2lbs per week. Call with any concerns. Continue to monitor.

## 2024-01-03 NOTE — Assessment & Plan Note (Signed)
 Under good control on current regimen. Continue current regimen. Continue to monitor. Call with any concerns. Refills given. Labs drawn today.

## 2024-01-03 NOTE — Patient Instructions (Signed)
 Please call to schedule your mammogram and/or bone density: [facility]  Address: [address], [place], [place] [address] Phone: [phone]  [facility] [address] [place],  [place]  [address] Phone: [phone]

## 2024-01-03 NOTE — Progress Notes (Signed)
 BP 138/76   Pulse 81   Temp 97.6 F (36.4 C) (Oral)   Ht 5' 6 (1.676 m)   Wt 300 lb (136.1 kg)   SpO2 97%   BMI 48.42 kg/m    Subjective:    Patient ID: Latoya Lutz, female    DOB: [birth year], 53 y.o.   MRN: [id number]  HPI: Latoya Lutz is a 53 y.o. female presenting on 01/03/2024 for comprehensive medical examination. Current medical complaints include:  Psoriatic arthritis with extensive osteophytes. Has been working with rheumatology. Just started on Tremfya  and she's feeling about 40% better.   HYPERTENSION / HYPERLIPIDEMIA Satisfied with current treatment? yes Duration of hypertension: chronic BP monitoring frequency: not checking BP medication side effects: no Past BP meds: HCTZ Duration of hyperlipidemia: chronic Cholesterol medication side effects: no Cholesterol supplements: none Past cholesterol medications: zetia , pravastatin  Medication compliance: excellent compliance Aspirin: no Recent stressors: no Recurrent headaches: no Visual changes: no Palpitations: no Dyspnea: no Chest pain: no Lower extremity edema: no Dizzy/lightheaded: no   She currently lives with: husband Menopausal Symptoms: no  Depression Screen done today and results listed below:     01/03/2024    2:36 PM 04/12/2023    1:27 PM 12/24/2022    2:43 PM 10/04/2022    3:23 PM 06/18/2022    2:34 PM  Depression screen PHQ 2/9  Decreased Interest 0 0 0 0 0  Down, Depressed, Hopeless 0 0 0 0 0  PHQ - 2 Score 0 0 0 0 0  Altered sleeping 2 0 0 0 0  Tired, decreased energy 3 0 1 0 0  Change in appetite 1 0 0 0 0  Feeling bad or failure about yourself  1 0 0 0 0  Trouble concentrating 1 0 0 0 0  Moving slowly or fidgety/restless 1 0 0 0 0  Suicidal thoughts 0 0 0 0 0  PHQ-9 Score 9 0 1 0 0  Difficult doing work/chores Somewhat difficult Not difficult at all Not difficult at all Not difficult at all Not difficult at all     Past Medical History:  Past Medical History:   Diagnosis Date   Acid reflux    Actinic keratosis    Allergy    Arthritis    Atypical mole 10/27/2019   Left labia mucosa    Atypical nevus 05/25/2020   R labia majora - atypical proliferation   Candidal dermatitis 05/25/2014   Cervical spine disease    Chicken pox    Chronic urethral narrowing    undergoing stretching   Complication of anesthesia    Diverticulitis    Dysplastic nevus 04/05/2021   R post med thigh near popliteal, mod to severe atypia   Endometriosis    Dr. Ruthell at [facility], removed    Esophageal reflux 05/25/2014   Gross hematuria 04/25/2015   H/O cystoscopy    normal   Heart murmur    Hx of basal cell carcinoma 12/29/2008   L low back   Hypertension    Infection of urinary tract 09/11/2014   Kidney stone 09/11/2014   Kidney stones    Microscopic hematuria 09/11/2014   Obesity, morbid, BMI 40.0-49.9 (HCC) 04/10/2012   PONV (postoperative nausea and vomiting)    Right ureteral stone 04/28/2015   Skin cancer    Urinary retention     Surgical History:  Past Surgical History:  Procedure Laterality Date   ABDOMINAL HYSTERECTOMY     BLADDER SURGERY  07/2003  CARPAL TUNNEL RELEASE Right    CESAREAN SECTION  1998   CHOLECYSTECTOMY     COLONOSCOPY WITH PROPOFOL  N/A 08/24/2021   Procedure: COLONOSCOPY WITH PROPOFOL ;  Surgeon: Jinny Carmine, MD;  Location: [facility] ENDOSCOPY;  Service: Endoscopy;  Laterality: N/A;  9 AM ARRIVAL, PLEASE   CYSTOSCOPY W/ RETROGRADES Right 05/16/2015   Procedure: CYSTOSCOPY WITH RETROGRADE PYELOGRAM;  Surgeon: Redell Lynwood Napoleon, MD;  Location: [facility] ORS;  Service: Urology;  Laterality: Right;   CYSTOSCOPY W/ URETERAL STENT PLACEMENT Right 05/16/2015   Procedure: CYSTOSCOPY WITH STENT REPLACEMENT;  Surgeon: Redell Lynwood Napoleon, MD;  Location: [facility] ORS;  Service: Urology;  Laterality: Right;   CYSTOSCOPY WITH STENT PLACEMENT Right 04/29/2015   Procedure: CYSTOSCOPY WITH STENT PLACEMENT;  Surgeon: Redell Lynwood Napoleon, MD;  Location: [facility]  ORS;  Service: Urology;  Laterality: Right;   ESOPHAGOGASTRODUODENOSCOPY N/A 08/24/2021   Procedure: ESOPHAGOGASTRODUODENOSCOPY (EGD);  Surgeon: Jinny Carmine, MD;  Location: [facility] ENDOSCOPY;  Service: Endoscopy;  Laterality: N/A;   EXTRACORPOREAL SHOCK WAVE LITHOTRIPSY Left 06/02/2015   Procedure: EXTRACORPOREAL SHOCK WAVE LITHOTRIPSY (ESWL);  Surgeon: Rosina Riis, MD;  Location: [facility] ORS;  Service: Urology;  Laterality: Left;   FOOT SURGERY  2003   KNEE SURGERY  2005   TONSILLECTOMY     TONSILLECTOMY     lingual growth on vocal cord removal   TOTAL ABDOMINAL HYSTERECTOMY W/ BILATERAL SALPINGOOPHORECTOMY     [facility] complete   URETEROSCOPY WITH HOLMIUM LASER LITHOTRIPSY Right 05/16/2015   Procedure: URETEROSCOPY WITH HOLMIUM LASER LITHOTRIPSY;  Surgeon: Redell Lynwood Napoleon, MD;  Location: [facility] ORS;  Service: Urology;  Laterality: Right;    Medications:  Current Outpatient Medications on File Prior to Visit  Medication Sig   albuterol  (VENTOLIN  HFA) 108 (90 Base) MCG/ACT inhaler Inhale 2 puffs into the lungs every 6 (six) hours as needed for wheezing or shortness of breath.   azelastine  (ASTELIN ) 0.1 % nasal spray Place 2 sprays into both nostrils 2 (two) times daily. Use in each nostril as directed   Calcium  Carbonate (CALCIUM  600 PO) Take 1 tablet by mouth daily.    cyclobenzaprine  (FLEXERIL ) 10 MG tablet Take 1 tablet (10 mg total) by mouth at bedtime.   diclofenac  Sodium (VOLTAREN ) 1 % GEL Apply 2 g topically 4 (four) times daily.   fexofenadine  (ALLEGRA) 180 MG tablet Take 180 mg by mouth daily.   fluticasone  (FLONASE ) 50 MCG/ACT nasal spray PLACE 2 SPRAYS INTO BOTH NOSTRILS DAILY.   guselkumab  (TREMFYA  ONE-PRESS) 100 MG/ML pen Inject 100mg  into the skin at Week 4 (Week 0 administered in clinic on 12/02/23)   guselkumab  (TREMFYA  ONE-PRESS) 100 MG/ML pen Inject 1 mL (100 mg total) into the skin every 8 (eight) weeks. **maintenance dose**   Magnesium 100 MG CAPS Take 30 mg by mouth 2 (two)  times daily.   Nutritional Supplements (MENOPAUSE FORMULA) TABS Take by mouth. Reported on 11/04/2015   pseudoephedrine (SUDAFED) 30 MG tablet Take 30 mg by mouth 3 (three) times daily. (Patient taking differently: Take 30 mg by mouth every 6 (six) hours as needed for congestion.)   Roflumilast  (ZORYVE ) 0.3 % CREA Apply once daily to affected areas of psoriasis   sucralfate  (CARAFATE ) 1 g tablet Take 1 tablet (1 g total) by mouth 4 (four) times daily -  with meals and at bedtime.   VITAMIN D  PO Take by mouth.   vitamin E 400 UNIT capsule Take 400 Units by mouth daily.   No current facility-administered medications on file prior to visit.  Allergies:  Allergies  Allergen Reactions   Atorvastatin  Other (See Comments)    Leg cramps    Social History:  Social History   Socioeconomic History   Marital status: Married    Spouse name: Not on file   Number of children: Not on file   Years of education: Not on file   Highest education level: 12th grade  Occupational History   Not on file  Tobacco Use   Smoking status: Former    Current packs/day: 0.00    Average packs/day: 0.5 packs/day for 17.0 years (8.5 ttl pk-yrs)    Types: Cigarettes    Start date: 04/28/1991    Quit date: 04/27/2008    Years since quitting: 15.6    Passive exposure: Past   Smokeless tobacco: Never   Tobacco comments:    quit 7 years   Vaping Use   Vaping status: Never Used  Substance and Sexual Activity   Alcohol use: No   Drug use: No   Sexual activity: Yes    Birth control/protection: None  Other Topics Concern   Not on file  Social History Narrative   Lives in [place] with son 15YO and fiance      Work - [employer]   Social Drivers of Corporate investment banker Strain: Low Risk  (12/31/2023)   Overall Financial Resource Strain (CARDIA)    Difficulty of Paying Living Expenses: Not hard at all  Food Insecurity: No Food Insecurity (12/31/2023)   Hunger Vital Sign    Worried About Running Out of  Food in the Last Year: Never true    Ran Out of Food in the Last Year: Never true  Transportation Needs: No Transportation Needs (12/31/2023)   PRAPARE - Administrator, Civil Service (Medical): No    Lack of Transportation (Non-Medical): No  Physical Activity: Insufficiently Active (12/31/2023)   Exercise Vital Sign    Days of Exercise per Week: 2 days    Minutes of Exercise per Session: 30 min  Stress: No Stress Concern Present (12/31/2023)   [employer] of Occupational Health - Occupational Stress Questionnaire    Feeling of Stress: Not at all  Social Connections: Socially Integrated (12/31/2023)   Social Connection and Isolation Panel    Frequency of Communication with Friends and Family: More than three times a week    Frequency of Social Gatherings with Friends and Family: More than three times a week    Attends Religious Services: More than 4 times per year    Active Member of [employer] or Organizations: Yes    Attends Engineer, structural: More than 4 times per year    Marital Status: Married  Catering manager Violence: Not At Risk (01/03/2024)   Humiliation, Afraid, Rape, and Kick questionnaire    Fear of Current or Ex-Partner: No    Emotionally Abused: No    Physically Abused: No    Sexually Abused: No   Social History   Tobacco Use  Smoking Status Former   Current packs/day: 0.00   Average packs/day: 0.5 packs/day for 17.0 years (8.5 ttl pk-yrs)   Types: Cigarettes   Start date: 04/28/1991   Quit date: 04/27/2008   Years since quitting: 15.6   Passive exposure: Past  Smokeless Tobacco Never  Tobacco Comments   quit 7 years    Social History   Substance and Sexual Activity  Alcohol Use No    Family History:  Family History  Problem Relation Age of Onset  Cancer Mother        pancreatic cancer   Heart disease Mother        Pacemaker and defib   Hypertension Mother    Hyperlipidemia Mother    Early death Mother    Stroke Mother     Varicose Veins Mother    Cancer Father    COPD Father        Lung and brain cancer   Early death Father    Cancer Maternal Grandmother        Colon   Diabetes Paternal Grandmother    Cirrhosis Paternal Grandmother    Cancer Paternal Grandmother    Kidney disease Neg Hx    Bladder Cancer Neg Hx    Breast cancer Neg Hx     Past medical history, surgical history, medications, allergies, family history and social history reviewed with patient today and changes made to appropriate areas of the chart.   Review of Systems  Constitutional: Negative.   HENT: Negative.    Eyes:  Positive for blurred vision. Negative for double vision, photophobia, pain, discharge and redness.  Respiratory: Negative.    Cardiovascular:  Positive for leg swelling. Negative for chest pain, palpitations, orthopnea, claudication and PND.  Gastrointestinal:  Positive for heartburn. Negative for abdominal pain, blood in stool, constipation, diarrhea, melena, nausea and vomiting.  Genitourinary:  Positive for frequency. Negative for dysuria, flank pain, hematuria and urgency.  Musculoskeletal: Negative.   Skin: Negative.   Neurological: Negative.   Endo/Heme/Allergies: Negative.   Psychiatric/Behavioral: Negative.     All other ROS negative except what is listed above and in the HPI.      Objective:    BP 138/76   Pulse 81   Temp 97.6 F (36.4 C) (Oral)   Ht 5' 6 (1.676 m)   Wt 300 lb (136.1 kg)   SpO2 97%   BMI 48.42 kg/m   Wt Readings from Last 3 Encounters:  01/03/24 300 lb (136.1 kg)  11/13/23 (!) 302 lb (137 kg)  09/19/23 300 lb (136.1 kg)    Physical Exam Vitals and nursing note reviewed.  Constitutional:      General: She is not in acute distress.    Appearance: Normal appearance. She is obese. She is not ill-appearing, toxic-appearing or diaphoretic.  HENT:     Head: Normocephalic and atraumatic.     Right Ear: Tympanic membrane, ear canal and external ear normal. There is no impacted  cerumen.     Left Ear: Tympanic membrane, ear canal and external ear normal. There is no impacted cerumen.     Nose: Nose normal. No congestion or rhinorrhea.     Mouth/Throat:     Mouth: Mucous membranes are moist.     Pharynx: Oropharynx is clear. No oropharyngeal exudate or posterior oropharyngeal erythema.  Eyes:     General: No scleral icterus.       Right eye: No discharge.        Left eye: No discharge.     Extraocular Movements: Extraocular movements intact.     Conjunctiva/sclera: Conjunctivae normal.     Pupils: Pupils are equal, round, and reactive to light.  Neck:     Vascular: No carotid bruit.  Cardiovascular:     Rate and Rhythm: Normal rate and regular rhythm.     Pulses: Normal pulses.     Heart sounds: No murmur heard.    No friction rub. No gallop.  Pulmonary:     Effort: Pulmonary effort is  normal. No respiratory distress.     Breath sounds: Normal breath sounds. No stridor. No wheezing, rhonchi or rales.  Chest:     Chest wall: No tenderness.  Abdominal:     General: Abdomen is flat. Bowel sounds are normal. There is no distension.     Palpations: Abdomen is soft. There is no mass.     Tenderness: There is no abdominal tenderness. There is no right CVA tenderness, left CVA tenderness, guarding or rebound.     Hernia: No hernia is present.  Genitourinary:    Comments: Breast and pelvic exams deferred with shared decision making Musculoskeletal:        General: No swelling, tenderness, deformity or signs of injury.     Cervical back: Normal range of motion and neck supple. No rigidity. No muscular tenderness.     Right lower leg: No edema.     Left lower leg: No edema.  Lymphadenopathy:     Cervical: No cervical adenopathy.  Skin:    General: Skin is warm and dry.     Capillary Refill: Capillary refill takes less than 2 seconds.     Coloration: Skin is not jaundiced or pale.     Findings: No bruising, erythema, lesion or rash.  Neurological:      General: No focal deficit present.     Mental Status: She is alert and oriented to person, place, and time. Mental status is at baseline.     Cranial Nerves: No cranial nerve deficit.     Sensory: No sensory deficit.     Motor: No weakness.     Coordination: Coordination normal.     Gait: Gait normal.     Deep Tendon Reflexes: Reflexes normal.  Psychiatric:        Mood and Affect: Mood normal.        Behavior: Behavior normal.        Thought Content: Thought content normal.        Judgment: Judgment normal.     Results for orders placed or performed during the hospital encounter of 12/30/23  Comprehensive metabolic panel   Collection Time: 12/30/23  2:04 PM  Result Value Ref Range   Sodium 140 135 - 145 mmol/L   Potassium 3.7 3.5 - 5.1 mmol/L   Chloride 102 98 - 111 mmol/L   CO2 29 22 - 32 mmol/L   Glucose, Bld 94 70 - 99 mg/dL   BUN 18 6 - 20 mg/dL   Creatinine, Ser 9.18 0.44 - 1.00 mg/dL   Calcium  9.3 8.9 - 10.3 mg/dL   Total Protein 6.8 6.5 - 8.1 g/dL   Albumin 3.8 3.5 - 5.0 g/dL   AST 15 15 - 41 U/L   ALT 13 0 - 44 U/L   Alkaline Phosphatase 83 38 - 126 U/L   Total Bilirubin 0.3 0.0 - 1.2 mg/dL   GFR, Estimated >39 >39 mL/min   Anion gap 9 5 - 15  CBC with Differential/Platelet   Collection Time: 12/30/23  2:04 PM  Result Value Ref Range   WBC 7.2 4.0 - 10.5 K/uL   RBC 4.55 3.87 - 5.11 MIL/uL   Hemoglobin 12.0 12.0 - 15.0 g/dL   HCT 62.7 63.9 - 53.9 %   MCV 81.8 80.0 - 100.0 fL   MCH 26.4 26.0 - 34.0 pg   MCHC 32.3 30.0 - 36.0 g/dL   RDW 86.0 88.4 - 84.4 %   Platelets 248 150 - 400 K/uL   nRBC 0.0  0.0 - 0.2 %   Neutrophils Relative % 53 %   Neutro Abs 3.8 1.7 - 7.7 K/uL   Lymphocytes Relative 35 %   Lymphs Abs 2.5 0.7 - 4.0 K/uL   Monocytes Relative 8 %   Monocytes Absolute 0.6 0.1 - 1.0 K/uL   Eosinophils Relative 3 %   Eosinophils Absolute 0.2 0.0 - 0.5 K/uL   Basophils Relative 1 %   Basophils Absolute 0.1 0.0 - 0.1 K/uL   Immature Granulocytes 0 %    Abs Immature Granulocytes 0.01 0.00 - 0.07 K/uL      Assessment & Plan:   Problem List Items Addressed This Visit       Cardiovascular and Mediastinum   Essential hypertension   Under good control on current regimen. Continue current regimen. Continue to monitor. Call with any concerns. Refills given. Labs drawn today.        Relevant Medications   ezetimibe  (ZETIA ) 10 MG tablet   hydrochlorothiazide  (HYDRODIURIL ) 25 MG tablet   pravastatin  (PRAVACHOL ) 20 MG tablet   Other Relevant Orders   Microalbumin, Urine Waived     Musculoskeletal and Integument   Psoriatic arthritis (HCC)   Continue to follow with rheumatology. Call with any concerns.       Relevant Medications   meloxicam  (MOBIC ) 15 MG tablet     Other   Morbid obesity (HCC)   Encouraged diet and exercise with goal of losing 1-2lbs per week. Call with any concerns. Continue to monitor.       Hyperlipidemia   Under good control on current regimen. Continue current regimen. Continue to monitor. Call with any concerns. Refills given. Labs drawn today.        Relevant Medications   ezetimibe  (ZETIA ) 10 MG tablet   hydrochlorothiazide  (HYDRODIURIL ) 25 MG tablet   pravastatin  (PRAVACHOL ) 20 MG tablet   Other Visit Diagnoses       Routine general medical examination at a health care facility    -  Primary   Vaccines up to date. Screening labs checked today. Pap and colonoscopy up to date. Mammo due in January. Continue diet and exercise. Call with any concerns.   Relevant Orders   Bayer DCA Hb A1c Waived   CBC with Differential/Platelet   Comprehensive metabolic panel with GFR   Lipid Panel w/o Chol/HDL Ratio   TSH   Microalbumin, Urine Waived   Hepatitis B surface antibody,quantitative     Encounter for screening mammogram for malignant neoplasm of breast       Mammo ordered today.   Relevant Orders   MM 3D SCREENING MAMMOGRAM BILATERAL BREAST        Follow up plan: Return in about 6 months (around  07/05/2024).   LABORATORY TESTING:  - Pap smear: up to date  IMMUNIZATIONS:   - Tdap: Tetanus vaccination status reviewed: last tetanus booster within 10 years. - Influenza: Postponed to flu season - Pneumovax: Not applicable - Prevnar: Not applicable - COVID: Refused - HPV: Not applicable - Shingrix vaccine: Up to date  SCREENING: -Mammogram: Up to date  - Colonoscopy: Up to date   PATIENT COUNSELING:   Advised to take 1 mg of folate supplement per day if capable of pregnancy.   Sexuality: Discussed sexually transmitted diseases, partner selection, use of condoms, avoidance of unintended pregnancy  and contraceptive alternatives.   Advised to avoid cigarette smoking.  I discussed with the patient that most people either abstain from alcohol or drink within safe limits (<=14/week and <=  4 drinks/occasion for males, <=7/weeks and <= 3 drinks/occasion for females) and that the risk for alcohol disorders and other health effects rises proportionally with the number of drinks per week and how often a drinker exceeds daily limits.  Discussed cessation/primary prevention of drug use and availability of treatment for abuse.   Diet: Encouraged to adjust caloric intake to maintain  or achieve ideal body weight, to reduce intake of dietary saturated fat and total fat, to limit sodium intake by avoiding high sodium foods and not adding table salt, and to maintain adequate dietary potassium and calcium  preferably from fresh fruits, vegetables, and low-fat dairy products.    stressed the importance of regular exercise  Injury prevention: Discussed safety belts, safety helmets, smoke detector, smoking near bedding or upholstery.   Dental health: Discussed importance of regular tooth brushing, flossing, and dental visits.    NEXT PREVENTATIVE PHYSICAL DUE IN 1 YEAR. Return in about 6 months (around 07/05/2024).

## 2024-01-03 NOTE — Assessment & Plan Note (Signed)
Continue to follow with rheumatology. Call with any concerns.  

## 2024-01-04 LAB — LIPID PANEL W/O CHOL/HDL RATIO

## 2024-01-05 LAB — COMPREHENSIVE METABOLIC PANEL WITH GFR
ALT: 12 IU/L (ref 0–32)
AST: 10 IU/L (ref 0–40)
Albumin: 4.1 g/dL (ref 3.8–4.9)
Alkaline Phosphatase: 104 IU/L (ref 44–121)
BUN/Creatinine Ratio: 18 (ref 9–23)
BUN: 13 mg/dL (ref 6–24)
Bilirubin Total: <0.2 mg/dL (ref 0.0–1.2)
CO2: 22 mmol/L (ref 20–29)
Calcium: 9.3 mg/dL (ref 8.7–10.2)
Chloride: 99 mmol/L (ref 96–106)
Creatinine, Ser: 0.72 mg/dL (ref 0.57–1.00)
Globulin, Total: 2.2 g/dL (ref 1.5–4.5)
Glucose: 109 mg/dL — ABNORMAL HIGH (ref 70–99)
Potassium: 3.4 mmol/L — ABNORMAL LOW (ref 3.5–5.2)
Sodium: 139 mmol/L (ref 134–144)
Total Protein: 6.3 g/dL (ref 6.0–8.5)
eGFR: 99 mL/min/1.73 (ref >59)

## 2024-01-05 LAB — LIPID PANEL W/O CHOL/HDL RATIO
Cholesterol, Total: 171 mg/dL (ref 100–199)
HDL: 51 mg/dL (ref >39)
LDL Chol Calc (NIH): 90 mg/dL (ref 0–99)
Triglycerides: 173 mg/dL — AB (ref 0–149)
VLDL Cholesterol Cal: 30 mg/dL (ref 5–40)

## 2024-01-05 LAB — CBC WITH DIFFERENTIAL/PLATELET
Basophils Absolute: 0 x10E3/uL (ref 0.0–0.2)
Basos: 1 %
EOS (ABSOLUTE): 0.2 x10E3/uL (ref 0.0–0.4)
Eos: 2 %
Hematocrit: 37.4 % (ref 34.0–46.6)
Hemoglobin: 11.7 g/dL (ref 11.1–15.9)
Immature Grans (Abs): 0 x10E3/uL (ref 0.0–0.1)
Immature Granulocytes: 0 %
Lymphocytes Absolute: 2.4 x10E3/uL (ref 0.7–3.1)
Lymphs: 32 %
MCH: 26.2 pg — ABNORMAL LOW (ref 26.6–33.0)
MCHC: 31.3 g/dL — ABNORMAL LOW (ref 31.5–35.7)
MCV: 84 fL (ref 79–97)
Monocytes Absolute: 0.6 x10E3/uL (ref 0.1–0.9)
Monocytes: 8 %
Neutrophils Absolute: 4.3 x10E3/uL (ref 1.4–7.0)
Neutrophils: 57 %
Platelets: 245 x10E3/uL (ref 150–450)
RBC: 4.47 x10E6/uL (ref 3.77–5.28)
RDW: 13.9 % (ref 11.7–15.4)
WBC: 7.5 x10E3/uL (ref 3.4–10.8)

## 2024-01-05 LAB — HEPATITIS B SURFACE ANTIBODY, QUANTITATIVE: Hepatitis B Surf Ab Quant: <3.5 m[IU]/mL — ABNORMAL LOW

## 2024-01-05 LAB — TSH: TSH: 2.23 u[IU]/mL (ref 0.450–4.500)

## 2024-01-06 ENCOUNTER — Ambulatory Visit: Payer: Self-pay | Admitting: Family Medicine

## 2024-01-10 ENCOUNTER — Ambulatory Visit

## 2024-01-10 ENCOUNTER — Other Ambulatory Visit: Payer: Self-pay

## 2024-01-13 ENCOUNTER — Ambulatory Visit (INDEPENDENT_AMBULATORY_CARE_PROVIDER_SITE_OTHER)

## 2024-01-13 DIAGNOSIS — Z23 Encounter for immunization: Secondary | ICD-10-CM

## 2024-01-13 NOTE — Progress Notes (Signed)
 Patient is in office today for a nurse visit for Immunization. Patient Injection was given in the  Right deltoid. Patient tolerated injection well.

## 2024-01-20 NOTE — Progress Notes (Signed)
 01/21/24 3:24 PM   [name] 1970/09/05 [id number]  Referring provider:  [name], DO [address] [place],  [place] [address]  Urological history  1. Urethral stricture - diagnosed by Dr. Patrina with urethral dilation and had been receiving serial dilations every 4 to 6 months since 2013 - managed with urethral dilation q 5 months at this time   2. Nephrolithiasis - right ureteral stones treated with ESWL and right URS/LL/right ureteral stent placement and stent removal in winter 2016 - CT Renal stone study performed on 04/05/2017 noted no nephroureterolithiasis.  No hydronephrosis - CT Renal stone study (06/2023) - no stones   Chief Complaint  Patient presents with   Other    HPI: Latoya Lutz is a 53 y.o.female  who presents today for urethral dilation and symptoms recheck.   Previous records reviewed.   She is having difficulty voiding and a weak urinary stream.   Patient denies any modifying or aggravating factors.  Patient denies any recent UTI's, gross hematuria, dysuria or suprapubic/flank pain.  Patient denies any fevers, chills, nausea or vomiting.    UA Bland    PMH: Past Medical History:  Diagnosis Date   Acid reflux    Actinic keratosis    Allergy    Arthritis    Atypical mole 10/27/2019   Left labia mucosa    Atypical nevus 05/25/2020   R labia majora - atypical proliferation   Candidal dermatitis 05/25/2014   Cervical spine disease    Chicken pox    Chronic urethral narrowing    undergoing stretching   Complication of anesthesia    Diverticulitis    Dysplastic nevus 04/05/2021   R post med thigh near popliteal, mod to severe atypia   Endometriosis    Dr. Ruthell at [facility], removed    Esophageal reflux 05/25/2014   Gross hematuria 04/25/2015   H/O cystoscopy    normal   Heart murmur    Hx of basal cell carcinoma 12/29/2008   L low back   Hypertension    Infection of urinary tract 09/11/2014   Kidney stone 09/11/2014    Kidney stones    Microscopic hematuria 09/11/2014   Obesity, morbid, BMI 40.0-49.9 (HCC) 04/10/2012   PONV (postoperative nausea and vomiting)    Right ureteral stone 04/28/2015   Skin cancer    Urinary retention     Surgical History: Past Surgical History:  Procedure Laterality Date   ABDOMINAL HYSTERECTOMY     BLADDER SURGERY  07/2003   CARPAL TUNNEL RELEASE Right    CESAREAN SECTION  1998   CHOLECYSTECTOMY     COLONOSCOPY WITH PROPOFOL  N/A 08/24/2021   Procedure: COLONOSCOPY WITH PROPOFOL ;  Surgeon: Jinny Carmine, MD;  Location: [facility] ENDOSCOPY;  Service: Endoscopy;  Laterality: N/A;  9 AM ARRIVAL, PLEASE   CYSTOSCOPY W/ RETROGRADES Right 05/16/2015   Procedure: CYSTOSCOPY WITH RETROGRADE PYELOGRAM;  Surgeon: Redell Lynwood Napoleon, MD;  Location: [facility] ORS;  Service: Urology;  Laterality: Right;   CYSTOSCOPY W/ URETERAL STENT PLACEMENT Right 05/16/2015   Procedure: CYSTOSCOPY WITH STENT REPLACEMENT;  Surgeon: Redell Lynwood Napoleon, MD;  Location: [facility] ORS;  Service: Urology;  Laterality: Right;   CYSTOSCOPY WITH STENT PLACEMENT Right 04/29/2015   Procedure: CYSTOSCOPY WITH STENT PLACEMENT;  Surgeon: Redell Lynwood Napoleon, MD;  Location: [facility] ORS;  Service: Urology;  Laterality: Right;   ESOPHAGOGASTRODUODENOSCOPY N/A 08/24/2021   Procedure: ESOPHAGOGASTRODUODENOSCOPY (EGD);  Surgeon: Jinny Carmine, MD;  Location: [facility] ENDOSCOPY;  Service: Endoscopy;  Laterality:  N/A;   EXTRACORPOREAL SHOCK WAVE LITHOTRIPSY Left 06/02/2015   Procedure: EXTRACORPOREAL SHOCK WAVE LITHOTRIPSY (ESWL);  Surgeon: Rosina Riis, MD;  Location: [facility] ORS;  Service: Urology;  Laterality: Left;   FOOT SURGERY  2003   KNEE SURGERY  2005   TONSILLECTOMY     TONSILLECTOMY     lingual growth on vocal cord removal   TOTAL ABDOMINAL HYSTERECTOMY W/ BILATERAL SALPINGOOPHORECTOMY     [facility] complete   URETEROSCOPY WITH HOLMIUM LASER LITHOTRIPSY Right 05/16/2015   Procedure: URETEROSCOPY WITH HOLMIUM LASER LITHOTRIPSY;  Surgeon:  Redell Lynwood Napoleon, MD;  Location: [facility] ORS;  Service: Urology;  Laterality: Right;    Home Medications:  Allergies as of 01/21/2024       Reactions   Atorvastatin  Other (See Comments)   Leg cramps        Medication List        Accurate as of January 21, 2024  3:24 PM. If you have any questions, ask your nurse or doctor.          acyclovir  400 MG tablet Commonly known as: ZOVIRAX  Take 1 tablet (400 mg total) by mouth 3 (three) times daily.   albuterol  108 (90 Base) MCG/ACT inhaler Commonly known as: VENTOLIN  HFA Inhale 2 puffs into the lungs every 6 (six) hours as needed for wheezing or shortness of breath.   Azelastine  HCl 137 MCG/SPRAY Soln Place 2 sprays into both nostrils 2 (two) times daily. Use in each nostril as directed   busPIRone  10 MG tablet Commonly known as: BUSPAR  Take 1 tablet (10 mg total) by mouth 3 (three) times daily.   CALCIUM  600 PO Take 1 tablet by mouth daily.   cyclobenzaprine  10 MG tablet Commonly known as: FLEXERIL  Take 1 tablet (10 mg total) by mouth at bedtime.   diclofenac  Sodium 1 % Gel Commonly known as: VOLTAREN  Apply 2 g topically 4 (four) times daily.   ezetimibe  10 MG tablet Commonly known as: ZETIA  Take 1 tablet (10 mg total) by mouth daily.   fexofenadine  180 MG tablet Commonly known as: ALLEGRA Take 180 mg by mouth daily.   fluticasone  50 MCG/ACT nasal spray Commonly known as: FLONASE  PLACE 2 SPRAYS INTO BOTH NOSTRILS DAILY.   hydrochlorothiazide  25 MG tablet Commonly known as: HYDRODIURIL  Take 1-2 tablets (25-50 mg total) by mouth daily.   Magnesium 100 MG Caps Take 30 mg by mouth 2 (two) times daily.   meloxicam  15 MG tablet Commonly known as: MOBIC  Take 1 tablet (15 mg total) by mouth daily.   Menopause Formula Tabs Take by mouth. Reported on 11/04/2015   pantoprazole  40 MG tablet Commonly known as: PROTONIX  Take 2 tablets (80 mg total) by mouth daily.   pravastatin  20 MG tablet Commonly known as:  PRAVACHOL  Take 1 tablet (20 mg total) by mouth 3 (three) times a week. Tuesday,Thursday,Saturday   pseudoephedrine 30 MG tablet Commonly known as: SUDAFED Take 30 mg by mouth 3 (three) times daily. What changed:  when to take this reasons to take this   Simethicone  180 MG Caps Take 1 capsule (180 mg total) by mouth 3 (three) times daily with meals.   sucralfate  1 g tablet Commonly known as: CARAFATE  Take 1 tablet (1 g total) by mouth 4 (four) times daily -  with meals and at bedtime.   Tremfya  One-Press 100 MG/ML pen Generic drug: guselkumab  Inject 100mg  into the skin at Week 4 (Week 0 administered in clinic on 12/02/23)   Tremfya  One-Press 100 MG/ML pen Generic  drug: guselkumab  Inject 1 mL (100 mg total) into the skin every 8 (eight) weeks. **maintenance dose**   VITAMIN D  PO Take by mouth.   vitamin E 180 MG (400 UNITS) capsule Take 400 Units by mouth daily.   Zoryve  0.3 % Crea Generic drug: Roflumilast  Apply once daily to affected areas of psoriasis        Allergies:  Allergies  Allergen Reactions   Atorvastatin  Other (See Comments)    Leg cramps    Family History: Family History  Problem Relation Age of Onset   Cancer Mother        pancreatic cancer   Heart disease Mother        Pacemaker and defib   Hypertension Mother    Hyperlipidemia Mother    Early death Mother    Stroke Mother    Varicose Veins Mother    Cancer Father    COPD Father        Lung and brain cancer   Early death Father    Cancer Maternal Grandmother        Colon   Diabetes Paternal Grandmother    Cirrhosis Paternal Grandmother    Cancer Paternal Grandmother    Kidney disease Neg Hx    Bladder Cancer Neg Hx    Breast cancer Neg Hx     Social History:  reports that she quit smoking about 15 years ago. Her smoking use included cigarettes. She started smoking about 32 years ago. She has a 8.5 pack-year smoking history. She has been exposed to tobacco smoke. She has never used  smokeless tobacco. She reports that she does not drink alcohol and does not use drugs.   Physical Exam: Blood pressure (!) 140/82, pulse 81, height 5' 6 (1.676 m), weight 297 lb (134.7 kg).  Constitutional:  Well nourished. Alert and oriented, No acute distress. HEENT: [place] AT, moist mucus membranes.  Trachea midline Cardiovascular: No clubbing, cyanosis, or edema. Respiratory: Normal respiratory effort, no increased work of breathing. Neurologic: Grossly intact, no focal deficits, moving all 4 extremities. Psychiatric: Normal mood and affect.    Laboratory Data: See EPIC and HPI I have reviewed the labs.   Pertinent Imaging N/A   Procedure: Patient is placed in stirrups and her urethral meatus and vulva are cleansed with Betadine.  2% Lidocaine  jelly was inserted into her urethra.  I then dilated her with Corbett sounds to a 30 Fr  without difficultly.  She tolerated the procedure well.  She will return in 5 months.   Assessment & Plan:    1. Urethral stricture -UA unremarkable  -return in 5 months for dilation   Return in about 5 months (around 06/22/2024) for Urethral dialation .  Latoya Lutz   [facility] [address], [address] [place], [place] [address] [phone]

## 2024-01-21 ENCOUNTER — Ambulatory Visit: Admitting: Urology

## 2024-01-21 VITALS — BP 140/82 | HR 81 | Ht 66.0 in | Wt 297.0 lb

## 2024-01-21 DIAGNOSIS — N3582 Other urethral stricture, female: Secondary | ICD-10-CM | POA: Diagnosis not present

## 2024-01-21 LAB — MICROSCOPIC EXAMINATION

## 2024-01-21 LAB — URINALYSIS, COMPLETE
Bilirubin, UA: NEGATIVE
Glucose, UA: NEGATIVE
Ketones, UA: NEGATIVE
Leukocytes,UA: NEGATIVE
Nitrite, UA: NEGATIVE
Protein,UA: NEGATIVE
RBC, UA: NEGATIVE
Specific Gravity, UA: 1.03 (ref 1.005–1.030)
Urobilinogen, Ur: 0.2 mg/dL (ref 0.2–1.0)
pH, UA: 6 (ref 5.0–7.5)

## 2024-01-24 ENCOUNTER — Other Ambulatory Visit: Payer: Self-pay

## 2024-01-26 ENCOUNTER — Other Ambulatory Visit: Payer: Self-pay

## 2024-01-27 ENCOUNTER — Other Ambulatory Visit: Payer: Self-pay

## 2024-01-27 ENCOUNTER — Encounter (INDEPENDENT_AMBULATORY_CARE_PROVIDER_SITE_OTHER): Payer: Self-pay

## 2024-01-27 DIAGNOSIS — R2242 Localized swelling, mass and lump, left lower limb: Secondary | ICD-10-CM | POA: Diagnosis not present

## 2024-01-27 DIAGNOSIS — M5416 Radiculopathy, lumbar region: Secondary | ICD-10-CM | POA: Diagnosis not present

## 2024-01-27 DIAGNOSIS — M1712 Unilateral primary osteoarthritis, left knee: Secondary | ICD-10-CM | POA: Diagnosis not present

## 2024-01-27 DIAGNOSIS — M5187 Other intervertebral disc disorders, lumbosacral region: Secondary | ICD-10-CM | POA: Diagnosis not present

## 2024-01-27 DIAGNOSIS — L4059 Other psoriatic arthropathy: Secondary | ICD-10-CM | POA: Diagnosis not present

## 2024-01-27 NOTE — Progress Notes (Signed)
 Specialty Pharmacy Refill Coordination Note  Latoya Lutz is a 53 y.o. female contacted today regarding refills of specialty medication(s) Guselkumab  (Tremfya  One-Press)   Patient requested (Patient-Rptd) Delivery   Delivery date: 01/29/24   Verified address: (Patient-Rptd) [address] [place] [address]   Medication will be filled on 08.19.25.

## 2024-01-30 ENCOUNTER — Other Ambulatory Visit (INDEPENDENT_AMBULATORY_CARE_PROVIDER_SITE_OTHER): Payer: Self-pay | Admitting: Nurse Practitioner

## 2024-01-30 DIAGNOSIS — R609 Edema, unspecified: Secondary | ICD-10-CM

## 2024-01-30 DIAGNOSIS — M1712 Unilateral primary osteoarthritis, left knee: Secondary | ICD-10-CM

## 2024-02-04 NOTE — Progress Notes (Deleted)
 Office Visit Note  Patient: Latoya Lutz             Date of Birth: [birth year]           MRN: [id number]             PCP: [name], DO Referring: [name], DO Visit Date: 02/17/2024   Subjective:  No chief complaint on file.   History of Present Illness: Latoya Lutz is a 53 y.o. female here for follow up for psoriatic arthritis and psoriasis.   Previous HPI 11/13/2023  Latoya Lutz is a 53 y.o. female here for follow up for psoriatic arthritis and psoriasis. Since last visit she continues to have ongoing joint pain in multiple areas. Imaging at our previous visit demonstrated some degenerative changes in the low back and hips but not severe. Labs showed elevated markers of inflammation vonsistent with active disease. Skin disease remains about the same with scalp rashes and fingernail pitting. She has not seen new episodes of significant finger or toe swelling.   Previous HPI 09/19/23 Latoya Lutz is a 53 year old female with psoriasis and psoriatic arthritis who presents with joint pain and skin issues. She was referred for evaluation of joint problems and a history of psoriasis.   She experiences ongoing joint pain, particularly in her knees, which has not improved significantly with gel injections. The pain is variable, with some days being better than others. She describes aching and burning sensations in her legs and notes that her legs feel like 'jello' in the mornings, requiring effort to get moving. She takes Mobic  daily and Tylenol  twice a day for pain management, and uses Voltaren  gel twice daily. She also takes a half tablet of methocarbamol at night to help with leg pain and relaxation. She has been diagnosed with psoriatic arthritis, with symptoms including joint pain, stiffness, and swelling. She reports that her nails have been peeling recently, and she experiences fatigue and difficulty sleeping due to pain in her legs,  hip, and back. She sleeps in a recliner as lying flat is uncomfortable. She has a history of knee surgery in 2007 and has been receiving cortisone shots, which are no longer effective.   She has a history of psoriasis, initially diagnosed after a severe throat infection in 2017 or 2018, which led to the removal of her second set of tonsils. Psoriasis initially appeared on her elbows and has since been managed with topical creams. She also reports pitting in her fingernails over the last two years, which she initially thought was nail fungus. She experiences rashes on her scalp and has dry skin, which she lotions daily. No recent significant rashes on her scalp, but she has experienced them in the past. No nail changes on her toes.   She has been evaluated for vascular issues due to leg cramps and swelling, but was advised to increase electrolyte intake. She drinks electrolyte water, which helps with the cramps. She has been told she has venous reflux but no significant vascular issues were found.   She has a history of bone spurs in her knees and ankles, and her spine has shown fusion of several levels in the lower thoracic region, which she was unaware of until recently. She experiences back pain and stiffness, which affects her mobility.    No Rheumatology ROS completed.   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   Essential hypertension 09/13/2019   Acute pain of left knee 07/10/2018   Acute pain of right shoulder 07/10/2018   Hyperlipidemia 10/19/2016   Murmur 07/23/2016   Acute anxiety 03/05/2016   Dysphagia 11/14/2015   History of urinary stone 10/29/2015   History of urethral narrowing 07/16/2015   Esophageal reflux 05/25/2014   Morbid obesity (HCC) 04/10/2012    Past Medical History:  Diagnosis Date   Acid reflux    Actinic  keratosis    Allergy    Arthritis    Atypical mole 10/27/2019   Left labia mucosa    Atypical nevus 05/25/2020   R labia majora - atypical proliferation   Candidal dermatitis 05/25/2014   Cervical spine disease    Chicken pox    Chronic urethral narrowing    undergoing stretching   Complication of anesthesia    Diverticulitis    Dysplastic nevus 04/05/2021   R post med thigh near popliteal, mod to severe atypia   Endometriosis    Dr. Ruthell at [facility], removed    Esophageal reflux 05/25/2014   Gross hematuria 04/25/2015   H/O cystoscopy    normal   Heart murmur    Hx of basal cell carcinoma 12/29/2008   L low back   Hypertension    Infection of urinary tract 09/11/2014   Kidney stone 09/11/2014   Kidney stones    Microscopic hematuria 09/11/2014   Obesity, morbid, BMI 40.0-49.9 (HCC) 04/10/2012   PONV (postoperative nausea and vomiting)    Right ureteral stone 04/28/2015   Skin cancer    Urinary retention     Family History  Problem Relation Age of Onset   Cancer Mother        pancreatic cancer   Heart disease Mother        Pacemaker and defib   Hypertension Mother    Hyperlipidemia Mother    Early death Mother    Stroke Mother    Varicose Veins Mother    Cancer Father    COPD Father        Lung and brain cancer   Early death Father    Cancer Maternal Grandmother        Colon   Diabetes Paternal Grandmother    Cirrhosis Paternal Grandmother    Cancer Paternal Grandmother    Kidney disease Neg Hx    Bladder Cancer Neg Hx    Breast cancer Neg Hx    Past Surgical History:  Procedure Laterality Date   ABDOMINAL HYSTERECTOMY     BLADDER SURGERY  07/2003   CARPAL TUNNEL RELEASE Right    CESAREAN SECTION  1998   CHOLECYSTECTOMY     COLONOSCOPY WITH PROPOFOL  N/A 08/24/2021   Procedure: COLONOSCOPY WITH PROPOFOL ;  Surgeon: Jinny Carmine, MD;  Location: [facility] ENDOSCOPY;  Service: Endoscopy;  Laterality: N/A;  9 AM ARRIVAL, PLEASE   CYSTOSCOPY W/ RETROGRADES Right  05/16/2015   Procedure: CYSTOSCOPY WITH RETROGRADE PYELOGRAM;  Surgeon: Redell Lynwood Napoleon, MD;  Location: [facility] ORS;  Service: Urology;  Laterality: Right;   CYSTOSCOPY W/ URETERAL STENT PLACEMENT Right 05/16/2015   Procedure: CYSTOSCOPY WITH STENT REPLACEMENT;  Surgeon: Redell Lynwood Napoleon, MD;  Location: [facility] ORS;  Service: Urology;  Laterality: Right;   CYSTOSCOPY WITH STENT PLACEMENT Right 04/29/2015   Procedure: CYSTOSCOPY WITH STENT PLACEMENT;  Surgeon: Redell Lynwood Napoleon, MD;  Location: [facility] ORS;  Service: Urology;  Laterality: Right;   ESOPHAGOGASTRODUODENOSCOPY N/A  08/24/2021   Procedure: ESOPHAGOGASTRODUODENOSCOPY (EGD);  Surgeon: Jinny Carmine, MD;  Location: [facility] ENDOSCOPY;  Service: Endoscopy;  Laterality: N/A;   EXTRACORPOREAL SHOCK WAVE LITHOTRIPSY Left 06/02/2015   Procedure: EXTRACORPOREAL SHOCK WAVE LITHOTRIPSY (ESWL);  Surgeon: Rosina Riis, MD;  Location: [facility] ORS;  Service: Urology;  Laterality: Left;   FOOT SURGERY  2003   KNEE SURGERY  2005   TONSILLECTOMY     TONSILLECTOMY     lingual growth on vocal cord removal   TOTAL ABDOMINAL HYSTERECTOMY W/ BILATERAL SALPINGOOPHORECTOMY     [facility] complete   URETEROSCOPY WITH HOLMIUM LASER LITHOTRIPSY Right 05/16/2015   Procedure: URETEROSCOPY WITH HOLMIUM LASER LITHOTRIPSY;  Surgeon: Redell Lynwood Napoleon, MD;  Location: [facility] ORS;  Service: Urology;  Laterality: Right;   Social History   Social History Narrative   Lives in [place] with son 15YO and fiance      Work - [employer] History  Administered Date(s) Administered   Fluad Quad(high Dose 65+) 02/27/2021   Hepb-cpg 01/13/2024   Influenza Split 03/19/2012, 02/25/2013   Influenza,inj,Quad PF,6+ Mos 03/25/2023   Influenza-Unspecified 02/23/2014, 03/15/2015, 03/21/2016, 03/28/2017, 02/19/2018, 03/02/2019, 02/26/2022   PFIZER(Purple Top)SARS-COV-2 Vaccination 06/08/2019, 06/29/2019   Td 06/18/2022   Tdap 09/30/2012   Zoster Recombinant(Shingrix) 05/19/2021,  08/17/2021     Objective: Vital Signs: There were no vitals taken for this visit.   Physical Exam   Musculoskeletal Exam: ***  CDAI Exam: CDAI Score: -- Patient Global: --; Provider Global: -- Swollen: --; Tender: -- Joint Exam 02/17/2024   No joint exam has been documented for this visit   There is currently no information documented on the homunculus. Go to the Rheumatology activity and complete the homunculus joint exam.  Investigation: No additional findings.  Imaging: No results found.  Recent Labs: Lab Results  Component Value Date   WBC 7.5 01/03/2024   HGB 11.7 01/03/2024   PLT 245 01/03/2024   NA 139 01/03/2024   K 3.4 (L) 01/03/2024   CL 99 01/03/2024   CO2 22 01/03/2024   GLUCOSE 109 (H) 01/03/2024   BUN 13 01/03/2024   CREATININE 0.72 01/03/2024   BILITOT <0.2 01/03/2024   ALKPHOS 104 01/03/2024   AST 10 01/03/2024   ALT 12 01/03/2024   PROT 6.3 01/03/2024   ALBUMIN 4.1 01/03/2024   CALCIUM  9.3 01/03/2024   GFRAA 104 08/05/2020   QFTBGOLDPLUS NEGATIVE 09/19/2023    Speciality Comments: No specialty comments available.  Procedures:  No procedures performed Allergies: Atorvastatin    Assessment / Plan:     Visit Diagnoses: No diagnosis found.  ***  Orders: No orders of the defined types were placed in this encounter.  No orders of the defined types were placed in this encounter.    Follow-Up Instructions: No follow-ups on file.   Gervis Gaba M Baran Kuhrt, CMA  Note - This record has been created using Animal nutritionist.  Chart creation errors have been sought, but may not always  have been located. Such creation errors do not reflect on  the standard of medical care.

## 2024-02-05 ENCOUNTER — Ambulatory Visit (INDEPENDENT_AMBULATORY_CARE_PROVIDER_SITE_OTHER)

## 2024-02-05 DIAGNOSIS — M1712 Unilateral primary osteoarthritis, left knee: Secondary | ICD-10-CM | POA: Diagnosis not present

## 2024-02-05 DIAGNOSIS — R609 Edema, unspecified: Secondary | ICD-10-CM | POA: Diagnosis not present

## 2024-02-11 ENCOUNTER — Encounter (INDEPENDENT_AMBULATORY_CARE_PROVIDER_SITE_OTHER): Payer: Self-pay | Admitting: Nurse Practitioner

## 2024-02-11 ENCOUNTER — Ambulatory Visit (INDEPENDENT_AMBULATORY_CARE_PROVIDER_SITE_OTHER): Admitting: Nurse Practitioner

## 2024-02-11 VITALS — BP 138/81 | HR 73 | Ht 66.0 in | Wt 297.2 lb

## 2024-02-11 DIAGNOSIS — I8312 Varicose veins of left lower extremity with inflammation: Secondary | ICD-10-CM | POA: Diagnosis not present

## 2024-02-11 DIAGNOSIS — I8311 Varicose veins of right lower extremity with inflammation: Secondary | ICD-10-CM

## 2024-02-11 DIAGNOSIS — R252 Cramp and spasm: Secondary | ICD-10-CM

## 2024-02-11 DIAGNOSIS — M1712 Unilateral primary osteoarthritis, left knee: Secondary | ICD-10-CM

## 2024-02-11 DIAGNOSIS — I10 Essential (primary) hypertension: Secondary | ICD-10-CM

## 2024-02-11 NOTE — Progress Notes (Signed)
 Subjective:    Patient ID: Latoya Lutz, female    DOB: 1970/08/28, 53 y.o.   MRN: [id number] No chief complaint on file.   The patient returns today for follow-up evaluation of swelling in her left lower extremity.  She has been having pain and swelling discomfort in her left lower extremity.  She was previously seen several years ago for lower extremity edema and discomfort but was found to have psoriatic arthritis.  After beginning treatment for this she did note some improvement but recently her left knee has been causing her much more pain and discomfort.  She is also much more swollen and tight and her orthopedic surgeon referred her to us  due to concern for possible DVT or venous cause of her swelling.  The patient underwent noninvasive studies which showed evidence of deep venous insufficiency bilaterally as well as superficial venous reflux bilaterally.  The studies are comparable to the study she had done on 03/28/2022.  In addition to the swelling she had significant pain and cramping in the leg as well.  She notes that her legs feel heavy and tired and achy at the end of the day.  She has been very diligent with using medical grade compression socks daily in addition to elevating her lower extremities and she is fairly active through the day.  Despite engaging in conservative therapy there has been no improvement in her symptoms.  At this time her symptoms are debilitating enough to cause difficulties with her activities of daily living.    Review of Systems  Cardiovascular:  Positive for leg swelling.  Musculoskeletal:  Positive for back pain.  All other systems reviewed and are negative.      Objective:   Physical Exam Vitals reviewed.  HENT:     Head: Normocephalic.  Cardiovascular:     Rate and Rhythm: Normal rate.  Pulmonary:     Effort: Pulmonary effort is normal.  Skin:    General: Skin is warm and dry.  Neurological:     Mental Status: She is alert and  oriented to person, place, and time.  Psychiatric:        Mood and Affect: Mood normal.        Behavior: Behavior normal.        Thought Content: Thought content normal.        Judgment: Judgment normal.     BP 138/81   Pulse 73   Ht 5' 6 (1.676 m)   Wt 297 lb 4 oz (134.8 kg)   BMI 47.98 kg/m   Past Medical History:  Diagnosis Date   Acid reflux    Actinic keratosis    Allergy    Arthritis    Atypical mole 10/27/2019   Left labia mucosa    Atypical nevus 05/25/2020   R labia majora - atypical proliferation   Candidal dermatitis 05/25/2014   Cervical spine disease    Chicken pox    Chronic urethral narrowing    undergoing stretching   Complication of anesthesia    Diverticulitis    Dysplastic nevus 04/05/2021   R post med thigh near popliteal, mod to severe atypia   Endometriosis    Dr. Ruthell at [facility], removed    Esophageal reflux 05/25/2014   Gross hematuria 04/25/2015   H/O cystoscopy    normal   Heart murmur    Hx of basal cell carcinoma 12/29/2008   L low back   Hypertension    Infection of urinary tract 09/11/2014  Kidney stone 09/11/2014   Kidney stones    Microscopic hematuria 09/11/2014   Obesity, morbid, BMI 40.0-49.9 (HCC) 04/10/2012   PONV (postoperative nausea and vomiting)    Right ureteral stone 04/28/2015   Skin cancer    Urinary retention     Social History   Socioeconomic History   Marital status: Married    Spouse name: Not on file   Number of children: Not on file   Years of education: Not on file   Highest education level: 12th grade  Occupational History   Not on file  Tobacco Use   Smoking status: Former    Current packs/day: 0.00    Average packs/day: 0.5 packs/day for 17.0 years (8.5 ttl pk-yrs)    Types: Cigarettes    Start date: 04/28/1991    Quit date: 04/27/2008    Years since quitting: 15.8    Passive exposure: Past   Smokeless tobacco: Never   Tobacco comments:    quit 7 years   Vaping Use   Vaping status:  Never Used  Substance and Sexual Activity   Alcohol use: No   Drug use: No   Sexual activity: Yes    Birth control/protection: None  Other Topics Concern   Not on file  Social History Narrative   Lives in [place] with son 15YO and fiance      Work - [employer]   Social Drivers of Corporate investment banker Strain: Low Risk  (12/31/2023)   Overall Financial Resource Strain (CARDIA)    Difficulty of Paying Living Expenses: Not hard at all  Food Insecurity: No Food Insecurity (12/31/2023)   Hunger Vital Sign    Worried About Running Out of Food in the Last Year: Never true    Ran Out of Food in the Last Year: Never true  Transportation Needs: No Transportation Needs (12/31/2023)   PRAPARE - Administrator, Civil Service (Medical): No    Lack of Transportation (Non-Medical): No  Physical Activity: Insufficiently Active (12/31/2023)   Exercise Vital Sign    Days of Exercise per Week: 2 days    Minutes of Exercise per Session: 30 min  Stress: No Stress Concern Present (12/31/2023)   [employer] of Occupational Health - Occupational Stress Questionnaire    Feeling of Stress: Not at all  Social Connections: Socially Integrated (12/31/2023)   Social Connection and Isolation Panel    Frequency of Communication with Friends and Family: More than three times a week    Frequency of Social Gatherings with Friends and Family: More than three times a week    Attends Religious Services: More than 4 times per year    Active Member of Clubs or Organizations: Yes    Attends Banker Meetings: More than 4 times per year    Marital Status: Married  Catering manager Violence: Not At Risk (01/03/2024)   Humiliation, Afraid, Rape, and Kick questionnaire    Fear of Current or Ex-Partner: No    Emotionally Abused: No    Physically Abused: No    Sexually Abused: No    Past Surgical History:  Procedure Laterality Date   ABDOMINAL HYSTERECTOMY     BLADDER SURGERY  07/2003    CARPAL TUNNEL RELEASE Right    CESAREAN SECTION  1998   CHOLECYSTECTOMY     COLONOSCOPY WITH PROPOFOL  N/A 08/24/2021   Procedure: COLONOSCOPY WITH PROPOFOL ;  Surgeon: Jinny Carmine, MD;  Location: [facility] ENDOSCOPY;  Service: Endoscopy;  Laterality: N/A;  9  AM ARRIVAL, PLEASE   CYSTOSCOPY W/ RETROGRADES Right 05/16/2015   Procedure: CYSTOSCOPY WITH RETROGRADE PYELOGRAM;  Surgeon: Redell Lynwood Napoleon, MD;  Location: [facility] ORS;  Service: Urology;  Laterality: Right;   CYSTOSCOPY W/ URETERAL STENT PLACEMENT Right 05/16/2015   Procedure: CYSTOSCOPY WITH STENT REPLACEMENT;  Surgeon: Redell Lynwood Napoleon, MD;  Location: [facility] OR[facility];  Service: Urology;  Laterality: Right;   CYSTOSCOPY WITH STENT PLACEMENT Right 04/29/2015   Procedure: CYSTOSCOPY WITH STENT PLACEMENT;  Surgeon: Redell Lynwood Napoleon, MD;  Location: [facility];  Service: Urology;  Laterality: Right;   ESOPHAGOGASTRODUODENOSCOPY N/A 08/24/2021   Procedure: ESOPHAGOGASTRODUODENOSCOPY (EGD);  Surgeon: Jinny Carmine, MD;  [facility];  Service: Endoscopy;  Laterality: N/A;   EXTRACORPOREAL SHOCK WAVE LITHOTRIPSY Left 06/02/2015   Procedure: EXTRACORPOREAL SHOCK WAVE LITHOTRIPSY (ESWL);  Surgeon: Rosina Riis, MD;  Location: [facility] OR[facility];  Service: Urology;  Laterality: Left;   FOOT SURGERY  2003   KNEE SURGERY  2005   TONSILLECTOMY     TONSILLECTOMY     lingual growth on vocal cord removal   TOTAL ABDOMINAL HYSTERECTOMY W/ BILATERAL SALPINGOOPHORECTOMY     [facility] complete   URETEROSCOPY WITH HOLMIUM LASER LITHOTRIPSY Right 05/16/2015   Procedure: URETEROSCOPY WITH HOLMIUM LASER LITHOTRIPSY;  Surgeon: Redell Lynwood Napoleon, MD;  Location: [facility] OR[facility];  Service: Urology;  Laterality: Right;    Family History  Problem Relation Age of Onset   Cancer Mother        pancreatic cancer   Heart disease Mother        Pacemaker and defib   Hypertension Mother    Hyperlipidemia Mother    Early death Mother    Stroke Mother    Varicose Veins Mother     Cancer Father    COPD Father        Lung and brain cancer   Early death Father    Cancer Maternal Grandmother        Colon   Diabetes Paternal Grandmother    Cirrhosis Paternal Grandmother    Cancer Paternal Grandmother    Kidney disease Neg Hx    Bladder Cancer Neg Hx    Breast cancer Neg Hx     Allergies  Allergen Reactions   Atorvastatin  Other (See Comments)    Leg cramps       Latest Ref Rng & Units 01/03/2024    2:38 PM 12/30/2023    2:04 PM 06/27/2023    2:41 PM  CBC  WBC 3.4 - 10.8 x10E3/uL 7.5  7.2  7.5   Hemoglobin 11.1 - 15.9 g/dL 88.2  87.9  88.1   Hematocrit 34.0 - 46.6 % 37.4  37.2  36.9   Platelets 150 - 450 x10E3/uL 245  248  230       CMP     Component Value Date/Time   NA 139 01/03/2024 1438   NA 143 04/15/2012 1716   K 3.4 (L) 01/03/2024 1438   K 4.0 04/15/2012 1716   CL 99 01/03/2024 1438   CL 107 04/15/2012 1716   CO2 22 01/03/2024 1438   CO2 29 04/15/2012 1716   GLUCOSE 109 (H) 01/03/2024 1438   GLUCOSE 94 12/30/2023 1404   GLUCOSE 85 04/15/2012 1716   BUN 13 01/03/2024 1438   BUN 12 04/15/2012 1716   CREATININE 0.72 01/03/2024 1438   CREATININE 0.79 04/15/2012 1716   CALCIUM  9.3 01/03/2024 1438   CALCIUM  9.2 04/15/2012 1716   PROT 6.3 01/03/2024 1438   PROT 7.8 04/15/2012 1716  ALBUMIN 4.1 01/03/2024 1438   ALBUMIN 3.9 04/15/2012 1716   AST 10 01/03/2024 1438   AST 24 04/15/2012 1716   ALT 12 01/03/2024 1438   ALT 33 04/15/2012 1716   ALKPHOS 104 01/03/2024 1438   ALKPHOS 109 04/15/2012 1716   BILITOT <0.2 01/03/2024 1438   BILITOT 0.2 04/15/2012 1716   GFR 82.19 06/01/2015 0838   EGFR 99 01/03/2024 1438   GFRNONAA >60 12/30/2023 1404   GFRNONAA >60 04/15/2012 1716     No results found.     Assessment & Plan:   1. Varicose veins of both lower extremities with inflammation (Primary) Recommend  I have reviewed my previous  discussion with the patient regarding  leg pain in association with varicose veins, venous  insufficiency and leg swelling.  I discussed why these conditions cause symptoms. The patient will continue wearing graduated compression stockings class 1 on a daily basis, beginning first thing in the morning and removing them in the evening.  The patient is CEAP [id number].  The patient has been wearing compression for more than 12 weeks with no or little benefit.  The patient has been exercising daily for more than 12 weeks. The patient has been elevating and taking OTC pain medications for more than 12 weeks.  None of these have have eliminated the pain in the lower extremities nor the discomfort which may very well be related to venous congestion.    In addition, behavioral modification including elevation during the day was again stressed and this will continue.  The patient has utilized over the counter pain medications and has been exercising.  However, at this time conservative therapy has not alleviated the patient's symptoms of leg pain and swelling  Recommend: laser ablation of the left  great saphenous veins to eliminate the symptoms of pain and swelling of the lower extremities caused by the severe superficial venous reflux disease.  We did also discuss at length that the extremity pain symptoms in some respects are somewhat atypical and that moving forward with laser ablation may not eliminate all of the lower extremity pain.  However, I do believe that laser ablation would offer significant improvement and therefore is a realistic and viable option.  2. Bilateral leg cramps Her leg cramping certainly could be related to her varicosities but she also has significant lower back issues as well.  3. Unilateral primary osteoarthritis, left knee I do believe that part of her swelling is multifactorial in nature.  I certainly believe that her veins are contributing but also believe that the inflammation from the arthritis of her left knee in addition to her known psoriatic arthritis are all  components of her leg swelling  4. Essential hypertension Continue antihypertensive medications as already ordered, these medications have been reviewed and there are no changes at this time.   Current Outpatient Medications on File Prior to Visit  Medication Sig Dispense Refill   acyclovir  (ZOVIRAX ) 400 MG tablet Take 1 tablet (400 mg total) by mouth 3 (three) times daily. 270 tablet 1   albuterol  (VENTOLIN  HFA) 108 (90 Base) MCG/ACT inhaler Inhale 2 puffs into the lungs every 6 (six) hours as needed for wheezing or shortness of breath. 6.7 g 3   azelastine  (ASTELIN ) 0.1 % nasal spray Place 2 sprays into both nostrils 2 (two) times daily. Use in each nostril as directed 30 mL 12   busPIRone  (BUSPAR ) 10 MG tablet Take 1 tablet (10 mg total) by mouth 3 (three) times daily. 90 tablet  6   Calcium  Carbonate (CALCIUM  600 PO) Take 1 tablet by mouth daily.      cyclobenzaprine  (FLEXERIL ) 10 MG tablet Take 1 tablet (10 mg total) by mouth at bedtime. 30 tablet 2   diclofenac  Sodium (VOLTAREN ) 1 % GEL Apply 2 g topically 4 (four) times daily. 300 g 6   ezetimibe  (ZETIA ) 10 MG tablet Take 1 tablet (10 mg total) by mouth daily. 90 tablet 1   fexofenadine  (ALLEGRA) 180 MG tablet Take 180 mg by mouth daily.     fluticasone  (FLONASE ) 50 MCG/ACT nasal spray PLACE 2 SPRAYS INTO BOTH NOSTRILS DAILY. 48 g 3   guselkumab  (TREMFYA  ONE-PRESS) 100 MG/ML pen Inject 100mg  into the skin at Week 4 (Week 0 administered in clinic on 12/02/23) 1 mL 0   guselkumab  (TREMFYA  ONE-PRESS) 100 MG/ML pen Inject 1 mL (100 mg total) into the skin every 8 (eight) weeks. **maintenance dose** 1 mL 0   hydrochlorothiazide  (HYDRODIURIL ) 25 MG tablet Take 1-2 tablets (25-50 mg total) by mouth daily. 180 tablet 1   Magnesium 100 MG CAPS Take 30 mg by mouth 2 (two) times daily.     meloxicam  (MOBIC ) 15 MG tablet Take 1 tablet (15 mg total) by mouth daily. 90 tablet 1   Nutritional Supplements (MENOPAUSE FORMULA) TABS Take by mouth. Reported  on 11/04/2015     pantoprazole  (PROTONIX ) 40 MG tablet Take 2 tablets (80 mg total) by mouth daily. 180 tablet 1   pravastatin  (PRAVACHOL ) 20 MG tablet Take 1 tablet (20 mg total) by mouth 3 (three) times a week. Tuesday,Thursday,Saturday 156 tablet 0   pseudoephedrine (SUDAFED) 30 MG tablet Take 30 mg by mouth 3 (three) times daily. (Patient taking differently: Take 30 mg by mouth every 6 (six) hours as needed for congestion.)     Roflumilast  (ZORYVE ) 0.3 % CREA Apply once daily to affected areas of psoriasis 60 g 2   Simethicone  180 MG CAPS Take 1 capsule (180 mg total) by mouth 3 (three) times daily with meals. 90 capsule 6   sucralfate  (CARAFATE ) 1 g tablet Take 1 tablet (1 g total) by mouth 4 (four) times daily -  with meals and at bedtime. 120 tablet 1   VITAMIN D  PO Take by mouth.     vitamin E 400 UNIT capsule Take 400 Units by mouth daily.     No current facility-administered medications on file prior to visit.    There are no Patient Instructions on file for this visit. No follow-ups on file.   Makinzey Banes E Ronne Savoia, NP

## 2024-02-16 ENCOUNTER — Other Ambulatory Visit: Payer: Self-pay

## 2024-02-16 ENCOUNTER — Other Ambulatory Visit: Payer: Self-pay | Admitting: Family Medicine

## 2024-02-17 ENCOUNTER — Other Ambulatory Visit: Payer: Self-pay | Admitting: Family Medicine

## 2024-02-17 ENCOUNTER — Other Ambulatory Visit: Payer: Self-pay

## 2024-02-17 ENCOUNTER — Ambulatory Visit: Admitting: Internal Medicine

## 2024-02-17 DIAGNOSIS — M47816 Spondylosis without myelopathy or radiculopathy, lumbar region: Secondary | ICD-10-CM

## 2024-02-17 DIAGNOSIS — L409 Psoriasis, unspecified: Secondary | ICD-10-CM

## 2024-02-17 DIAGNOSIS — Z79899 Other long term (current) drug therapy: Secondary | ICD-10-CM

## 2024-02-17 DIAGNOSIS — L4050 Arthropathic psoriasis, unspecified: Secondary | ICD-10-CM

## 2024-02-18 ENCOUNTER — Other Ambulatory Visit: Payer: Self-pay

## 2024-02-18 MED FILL — Sucralfate Tab 1 GM: ORAL | 30 days supply | Qty: 120 | Fill #0 | Status: AC

## 2024-02-18 NOTE — Telephone Encounter (Signed)
 Requested medications are due for refill today.  yes  Requested medications are on the active medications list.  yes  Last refill. 12/24/2022 #120 1 rf  Future visit scheduled.   yes  Notes to clinic.  Rx written to expire 12/24/2023.    Requested Prescriptions  Pending Prescriptions Disp Refills   sucralfate  (CARAFATE ) 1 g tablet 120 tablet 1    Sig: Take 1 tablet (1 g total) by mouth 4 (four) times daily -  with meals and at bedtime.     Gastroenterology: Antiacids Passed - 02/18/2024  2:12 PM      Passed - Valid encounter within last 12 months    Recent Outpatient Visits           1 month ago Routine general medical examination at a health care facility   [facility] [name], DO       Future Appointments             In 1 month [name], [name], MD [facility] - A Dept Of [facility]   In 1 month [name], MD [facility]   In 4 months McGowan, [name] [facility] [place]

## 2024-02-19 ENCOUNTER — Other Ambulatory Visit: Payer: Self-pay

## 2024-02-24 ENCOUNTER — Telehealth (INDEPENDENT_AMBULATORY_CARE_PROVIDER_SITE_OTHER): Payer: Self-pay | Admitting: Nurse Practitioner

## 2024-02-24 ENCOUNTER — Telehealth: Payer: Self-pay

## 2024-02-24 NOTE — Telephone Encounter (Signed)
 Spoke with patient to try and schedule left leg GSV laser ablation. Patient states that she has not made a decision if she wants to have the laser ablation done. She states that she is going on vacation and will think about it and will call after she returns.  When/If the patient wants to schedule:  left leg GSV laser. see jd. auth # [id number] exp: 9.29.25 - 3.12.26   1 week post left leg GSV laser   4 week post left leg GSV laser. see fb/bp

## 2024-02-24 NOTE — Telephone Encounter (Signed)
 Per previous encounter, PA should have supposedly expired on 12/25/23, however pt has since filled the medication on 01/28/24 without need for additional authorization. Regardless, will schedule PA renewal to be investigated after pt's upcoming appointment on 03/30/2024.

## 2024-03-02 ENCOUNTER — Ambulatory Visit: Admitting: Internal Medicine

## 2024-03-19 ENCOUNTER — Other Ambulatory Visit: Payer: Self-pay

## 2024-03-19 ENCOUNTER — Other Ambulatory Visit: Payer: Self-pay | Admitting: Internal Medicine

## 2024-03-19 DIAGNOSIS — Z79899 Other long term (current) drug therapy: Secondary | ICD-10-CM

## 2024-03-19 DIAGNOSIS — L4050 Arthropathic psoriasis, unspecified: Secondary | ICD-10-CM

## 2024-03-19 DIAGNOSIS — L409 Psoriasis, unspecified: Secondary | ICD-10-CM

## 2024-03-19 NOTE — Telephone Encounter (Signed)
 Last Fill: 12/02/2023  Labs: 01/03/2024 Glucose 109, Potassium 3.4, MCH 26.2, MCHC 31.3  TB Gold: 09/19/2023 Neg    Next Visit: 03/25/2024  Last Visit: 11/13/2023  IK:Ednmpjupr arthritis   Current Dose per office note 12/02/2023:  Tremfya  100mg  subcut at Week 0 (administered in office), Week 4, then every 8 weeks thereafter.   Okay to refill Tremfyia?

## 2024-03-19 NOTE — Progress Notes (Signed)
 Specialty Pharmacy Refill Coordination Note  Latoya Lutz is a 53 y.o. female contacted today regarding refills of specialty medication(s) Guselkumab  (Tremfya  One-Press)   Patient requested Delivery   Delivery date: 04/10/24   Verified address: [address] [place], [place] [address]   Medication will be filled on 04/09/24.

## 2024-03-25 ENCOUNTER — Ambulatory Visit: Attending: Internal Medicine | Admitting: Internal Medicine

## 2024-03-25 ENCOUNTER — Encounter: Payer: Self-pay | Admitting: Internal Medicine

## 2024-03-25 VITALS — BP 136/72 | HR 65 | Temp 97.5°F | Resp 16 | Ht 66.0 in | Wt 303.4 lb

## 2024-03-25 DIAGNOSIS — Z79899 Other long term (current) drug therapy: Secondary | ICD-10-CM | POA: Diagnosis not present

## 2024-03-25 DIAGNOSIS — L409 Psoriasis, unspecified: Secondary | ICD-10-CM

## 2024-03-25 DIAGNOSIS — L4050 Arthropathic psoriasis, unspecified: Secondary | ICD-10-CM

## 2024-03-25 NOTE — Progress Notes (Signed)
 Office Visit Note  Patient: Latoya Lutz             Date of Birth: 1970-12-03           MRN: [id number]             PCP: [name], DO Referring: [name], DO Visit Date: 03/25/2024   Subjective:  No chief complaint on file.    Discussed the use of AI scribe software for clinical note transcription with the patient, who gave verbal consent to proceed.  History of Present Illness   Latoya Lutz is a 53 year old female with psoriasis on Tremfya  100 mg [place] q8wks.  She has experienced significant improvement in her symptoms since starting Tremfya , with her last injection on September 15. She is now able to walk extensively and engage in activities she had not been able to do for years, such as riding to the beach. Previously, she experienced severe pain after a 20-minute drive to work, which has now improved significantly. No side effects from the injections, including injection site reactions or increased infections.  Her psoriasis has improved, with clearer elbows and only two fingers remaining in rough shape, which she continues to manage with filing. She reports that she is able to move more easily, such as getting off the couch without difficulty.  She has a history of hip issues, including bone spurs, and reports persistent issues, particularly with internal rotation. She experiences tightness in the muscles and tendons around the hips. She also has knee osteoarthritis and receives cortisone shots for relief. She has been taking meloxicam  for a while and discussed the possibility of switching to Celebrex, but has not made any changes yet.  She mentions a previous high CRP level of 22 earlier in the year and expresses interest in rechecking it to see if there has been a decrease with the medication.   Previous HPI 11/13/23 Latoya Lutz is a 53 y.o. female here for follow up for psoriatic arthritis and psoriasis. Since last visit she  continues to have ongoing joint pain in multiple areas. Imaging at our previous visit demonstrated some degenerative changes in the low back and hips but not severe. Labs showed elevated markers of inflammation vonsistent with active disease. Skin disease remains about the same with scalp rashes and fingernail pitting. She has not seen new episodes of significant finger or toe swelling.   Previous HPI 09/19/23 Latoya Lutz is a 53 year old female with psoriasis and psoriatic arthritis who presents with joint pain and skin issues. She was referred for evaluation of joint problems and a history of psoriasis.   She experiences ongoing joint pain, particularly in her knees, which has not improved significantly with gel injections. The pain is variable, with some days being better than others. She describes aching and burning sensations in her legs and notes that her legs feel like 'jello' in the mornings, requiring effort to get moving. She takes Mobic  daily and Tylenol  twice a day for pain management, and uses Voltaren  gel twice daily. She also takes a half tablet of methocarbamol at night to help with leg pain and relaxation. She has been diagnosed with psoriatic arthritis, with symptoms including joint pain, stiffness, and swelling. She reports that her nails have been peeling recently, and she experiences fatigue and difficulty sleeping due to pain in her legs, hip, and back. She sleeps in a recliner as lying flat is uncomfortable. She has  a history of knee surgery in 2007 and has been receiving cortisone shots, which are no longer effective.   She has a history of psoriasis, initially diagnosed after a severe throat infection in 2017 or 2018, which led to the removal of her second set of tonsils. Psoriasis initially appeared on her elbows and has since been managed with topical creams. She also reports pitting in her fingernails over the last two years, which she initially thought was nail  fungus. She experiences rashes on her scalp and has dry skin, which she lotions daily. No recent significant rashes on her scalp, but she has experienced them in the past. No nail changes on her toes.   She has been evaluated for vascular issues due to leg cramps and swelling, but was advised to increase electrolyte intake. She drinks electrolyte water, which helps with the cramps. She has been told she has venous reflux but no significant vascular issues were found.   She has a history of bone spurs in her knees and ankles, and her spine has shown fusion of several levels in the lower thoracic region, which she was unaware of until recently. She experiences back pain and stiffness, which affects her mobility.    Review of Systems  Constitutional:  Positive for fatigue.  HENT:  Positive for mouth sores and mouth dryness.   Eyes:  Negative for dryness.  Respiratory:  Negative for shortness of breath.   Cardiovascular:  Negative for chest pain and palpitations.  Gastrointestinal:  Positive for constipation and diarrhea. Negative for blood in stool.  Endocrine: Negative for increased urination.  Genitourinary:  Negative for involuntary urination.  Musculoskeletal:  Positive for joint pain, joint pain, joint swelling, myalgias, muscle weakness, morning stiffness, muscle tenderness and myalgias. Negative for gait problem.  Skin:  Negative for color change, rash, hair loss and sensitivity to sunlight.  Allergic/Immunologic: Negative for susceptible to infections.  Neurological:  Negative for dizziness and headaches.  Hematological:  Negative for swollen glands.  Psychiatric/Behavioral:  Negative for depressed mood and sleep disturbance. The patient is not nervous/anxious.     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/02/2020   Essential hypertension 09/13/2019   Acute pain of left knee 07/10/2018   Acute pain of right shoulder 07/10/2018   Hyperlipidemia 10/19/2016   Murmur 07/23/2016   Acute anxiety 03/05/2016   Dysphagia 11/14/2015   History of urinary stone 10/29/2015   History of urethral narrowing 07/16/2015   Esophageal reflux 05/25/2014   Morbid obesity (HCC) 04/10/2012    Past Medical History:  Diagnosis Date   Acid reflux    Actinic keratosis    Allergy    Arthritis    Atypical mole 10/27/2019   Left labia mucosa    Atypical nevus 05/25/2020   R labia majora - atypical proliferation   Candidal dermatitis 05/25/2014   Cervical spine disease    Chicken pox    Chronic urethral narrowing    undergoing stretching   Complication of anesthesia    Diverticulitis    Dysplastic nevus 04/05/2021   R post med thigh near popliteal, mod to severe atypia   Endometriosis    Dr. Ruthell at [facility], removed    Esophageal reflux 05/25/2014   Gross hematuria 04/25/2015   H/O cystoscopy    normal   Heart  murmur    Hx of basal cell carcinoma 12/29/2008   L low back   Hypertension    Infection of urinary tract 09/11/2014   Kidney stone 09/11/2014   Kidney stones    Microscopic hematuria 09/11/2014   Obesity, morbid, BMI 40.0-49.9 (HCC) 04/10/2012   PONV (postoperative nausea and vomiting)    Right ureteral stone 04/28/2015   Skin cancer    Urinary retention     Family History  Problem Relation Age of Onset   Cancer Mother        pancreatic cancer   Heart disease Mother        Pacemaker and defib   Hypertension Mother    Hyperlipidemia Mother    Early death Mother    Stroke Mother    Varicose Veins Mother    Cancer Father    COPD Father        Lung and brain cancer   Early death Father    Cancer Maternal Grandmother        Colon   Diabetes Paternal Grandmother    Cirrhosis Paternal Grandmother    Cancer Paternal Grandmother    Kidney disease Neg Hx    Bladder Cancer Neg Hx     Breast cancer Neg Hx    Past Surgical History:  Procedure Laterality Date   ABDOMINAL HYSTERECTOMY     BLADDER SURGERY  07/2003   CARPAL TUNNEL RELEASE Right    CESAREAN SECTION  1998   CHOLECYSTECTOMY     COLONOSCOPY WITH PROPOFOL  N/A 08/24/2021   Procedure: COLONOSCOPY WITH PROPOFOL ;  Surgeon: Jinny Carmine, MD;  Location: [facility] END[facility];  Service: Endoscopy;  Laterality: N/A;  9 AM ARRIVAL, PLEASE   CYSTOSCOPY W/ RETROGRADES Right 05/16/2015   Procedure: CYSTOSCOPY WITH RETROGRADE PYELOGRAM;  Surgeon: Redell Lynwood Napoleon, MD;  Location: [facility] ORS;  Service: Urology;  Laterality: Right;   CYSTOSCOPY W/ URETERAL STENT PLACEMENT Right 05/16/2015   Procedure: CYSTOSCOPY WITH STENT REPLACEMENT;  Surgeon: Redell Lynwood Napoleon, MD;  Location: [facility] ORS;  Service: Urology;  Laterality: Right;   CYSTOSCOPY WITH STENT PLACEMENT Right 04/29/2015   Procedure: CYSTOSCOPY WITH STENT PLACEMENT;  Surgeon: Redell Lynwood Napoleon, MD;  Location: [facility] ORS;  Service: Urology;  Laterality: Right;   ESOPHAGOGASTRODUODENOSCOPY N/A 08/24/2021   Procedure: ESOPHAGOGASTRODUODENOSCOPY (EGD);  Surgeon: Jinny Carmine, MD;  Location[facility] END[facility]COPY;  Service: Endoscopy;  Laterality: N/A;   EXTRACORPOREAL SHOCK WAVE LITHOTRIPSY Left 06/02/2015   Procedure: EXTRACORPOREAL SHOCK WAVE LITHOTRIPSY (ESWL);  Surgeon: Rosina Riis, MD;  Location: [facility] ORS;  Service: Urology;  Laterality: Left;   FOOT SURGERY  2003   KNEE SURGERY  2005   TONSILLECTOMY     TONSILLECTOMY     lingual growth on vocal cord removal   TOTAL ABDOMINAL HYSTERECTOMY W/ BILATERAL SALPINGOOPHORECTOMY     [facility] complete   URETEROSCOPY WITH HOLMIUM LASER LITHOTRIPSY Right 05/16/2015   Procedure: URETEROSCOPY WITH HOLMIUM LASER LITHOTRIPSY;  Surgeon: Redell Lynwood Napoleon, MD;  Location: [facility] ORS;  Service: Urology;  Laterality: Right;   Social History   Social History Narrative   Lives in [place] with son 15YO and fiance      Work - [employer]  History  Administered Date(s) Administered   Fluad Quad(high Dose 65+) 02/27/2021   Hepb-cpg 01/13/2024   Influenza Split 03/19/2012, 02/25/2013   Influenza,inj,Quad PF,6+ Mos 03/25/2023   Influenza-Unspecified 02/23/2014, 03/15/2015, 03/21/2016, 03/28/2017, 02/19/2018, 03/02/2019, 02/26/2022   PFIZER(Purple Top)SARS-COV-2 Vaccination 06/08/2019, 06/29/2019   Td 06/18/2022   Tdap 09/30/2012  Zoster Recombinant(Shingrix) 05/19/2021, 08/17/2021     Objective: Vital Signs: BP 136/72   Pulse 65   Temp (!) 97.5 F (36.4 C)   Resp 16   Ht 5' 6 (1.676 m)   Wt (!) 303 lb 6.4 oz (137.6 kg)   BMI 48.97 kg/m    Physical Exam Constitutional:      Appearance: She is obese.  Eyes:     Conjunctiva/sclera: Conjunctivae normal.  Cardiovascular:     Rate and Rhythm: Normal rate and regular rhythm.  Pulmonary:     Effort: Pulmonary effort is normal.     Breath sounds: Normal breath sounds.  Lymphadenopathy:     Cervical: No cervical adenopathy.  Skin:    General: Skin is warm and dry.     Findings: No rash.  Neurological:     Mental Status: She is alert.  Psychiatric:        Mood and Affect: Mood normal.      Musculoskeletal Exam:  Shoulders full ROM no tenderness or swelling Elbows full ROM no tenderness or swelling Wrists full ROM no tenderness or swelling Fingers full ROM no tenderness or swelling Right hip tenderness on palpation Knees full ROM no tenderness or swelling Reduced ankle flexibility    Investigation: No additional findings.  Imaging: No results found.  Recent Labs: Lab Results  Component Value Date   WBC 7.8 03/25/2024   HGB 12.2 03/25/2024   PLT 258 03/25/2024   NA 140 03/25/2024   K 3.8 03/25/2024   CL 101 03/25/2024   CO2 30 03/25/2024   GLUCOSE 100 (H) 03/25/2024   BUN 15 03/25/2024   CREATININE 0.72 03/25/2024   BILITOT 0.3 03/25/2024   ALKPHOS 104 01/03/2024   AST 11 03/25/2024   ALT 13 03/25/2024   PROT 6.7 03/25/2024   ALBUMIN  4.1 01/03/2024   CALCIUM  9.3 03/25/2024   GFRAA 104 08/05/2020   QFTBGOLDPLUS NEGATIVE 09/19/2023    Speciality Comments: No specialty comments available.  Procedures:  No procedures performed Allergies: Atorvastatin    Assessment / Plan:     Visit Diagnoses: Psoriatic arthritis (HCC) - Plan: C-reactive protein Significant improvement with Tremfya , increased mobility, decreased pain, no significant side effects.  Does have some residual joint pain and stiffness appears more related to osteoarthritis. - Continue Tremfya  100 mg [place] q8wks - Order CRP test to assess inflammation levels. - Schedule follow-up in six months for stable disease condition.  Psoriasis Skin disease extremely improved, not requiring any regular maintenance with topical treatment just as needed.  High risk medication use - Plan: CBC with Differential/Platelet, Comprehensive metabolic panel with GFR Tolerating medication well no reported side effects no serious interval infections. - Checking CBC and CMP for medication monitoring continue long-term use of NSAIDs interval via.  Osteoarthritis of knee Improvement with meloxicam . Discussed Celebrex but continuing meloxicam  due to improvement without changing yet - Continue meloxicam  15 mg daily (PCP Rx currently).   Orders: Orders Placed This Encounter  Procedures   CBC with Differential/Platelet   Comprehensive metabolic panel with GFR   C-reactive protein   No orders of the defined types were placed in this encounter.    Follow-Up Instructions: Return in about 6 months (around 09/23/2024) for PsA on GUS f/u 6mos.   Latoya LELON Ester, MD  Note - This record has been created using [employer].  Chart creation errors have been sought, but may not always  have been located. Such creation errors do not reflect on  the standard  of medical care.

## 2024-03-26 ENCOUNTER — Other Ambulatory Visit: Payer: Self-pay

## 2024-03-26 LAB — CBC WITH DIFFERENTIAL/PLATELET
Absolute Lymphocytes: 2668 {cells}/uL (ref 850–3900)
Absolute Monocytes: 577 {cells}/uL (ref 200–950)
Basophils Absolute: 31 {cells}/uL (ref 0–200)
Basophils Relative: 0.4 %
Eosinophils Absolute: 203 {cells}/uL (ref 15–500)
Eosinophils Relative: 2.6 %
HCT: 37.2 % (ref 35.0–45.0)
Hemoglobin: 12.2 g/dL (ref 11.7–15.5)
MCH: 26.9 pg — ABNORMAL LOW (ref 27.0–33.0)
MCHC: 32.8 g/dL (ref 32.0–36.0)
MCV: 82.1 fL (ref 80.0–100.0)
MPV: 10.2 fL (ref 7.5–12.5)
Monocytes Relative: 7.4 %
Neutro Abs: 4321 {cells}/uL (ref 1500–7800)
Neutrophils Relative %: 55.4 %
Platelets: 258 Thousand/uL (ref 140–400)
RBC: 4.53 Million/uL (ref 3.80–5.10)
RDW: 14.3 % (ref 11.0–15.0)
Total Lymphocyte: 34.2 %
WBC: 7.8 Thousand/uL (ref 3.8–10.8)

## 2024-03-26 LAB — COMPREHENSIVE METABOLIC PANEL WITH GFR
AG Ratio: 1.7 (calc) (ref 1.0–2.5)
ALT: 13 U/L (ref 6–29)
AST: 11 U/L (ref 10–35)
Albumin: 4.2 g/dL (ref 3.6–5.1)
Alkaline phosphatase (APISO): 90 U/L (ref 37–153)
BUN: 15 mg/dL (ref 7–25)
CO2: 30 mmol/L (ref 20–32)
Calcium: 9.3 mg/dL (ref 8.6–10.4)
Chloride: 101 mmol/L (ref 98–110)
Creat: 0.72 mg/dL (ref 0.50–1.03)
Globulin: 2.5 g/dL (ref 1.9–3.7)
Glucose, Bld: 100 mg/dL — ABNORMAL HIGH (ref 65–99)
Potassium: 3.8 mmol/L (ref 3.5–5.3)
Sodium: 140 mmol/L (ref 135–146)
Total Bilirubin: 0.3 mg/dL (ref 0.2–1.2)
Total Protein: 6.7 g/dL (ref 6.1–8.1)
eGFR: 99 mL/min/1.73m2 (ref >=60)

## 2024-03-26 LAB — C-REACTIVE PROTEIN: CRP: 22.7 mg/L — ABNORMAL HIGH (ref <8.0)

## 2024-03-26 MED ORDER — GUSELKUMAB 100 MG/ML ~~LOC~~ SOPN
100.0000 mg | PEN_INJECTOR | SUBCUTANEOUS | 2 refills | Status: AC
  Filled 2024-03-26: qty 1, 56d supply, fill #0
  Filled 2024-05-26 – 2024-06-03 (×3): qty 1, 56d supply, fill #1

## 2024-03-29 ENCOUNTER — Other Ambulatory Visit: Payer: Self-pay

## 2024-03-30 ENCOUNTER — Ambulatory Visit: Admitting: Internal Medicine

## 2024-04-02 NOTE — Telephone Encounter (Signed)
 PA submission still pending completion of OV note.

## 2024-04-09 ENCOUNTER — Other Ambulatory Visit: Payer: Self-pay

## 2024-04-09 NOTE — Telephone Encounter (Signed)
 Submitted an URGENT Prior Authorization RENEWAL request to [facility] for TREMFYA  via CoverMyMeds. Will update once we receive a response.  Key: [facility]

## 2024-04-10 NOTE — Telephone Encounter (Signed)
 Received notification from [facility] regarding a prior authorization for TREMFYA . Authorization has previously been approved from 12/16/2023 through 05/14/2024. Approval letter sent to scan center.  Authorization # [id number]  Sherry Pennant, PharmD, MPH, BCPS, CPP Clinical Pharmacist [facility])

## 2024-04-13 ENCOUNTER — Ambulatory Visit: Admitting: Dermatology

## 2024-04-14 ENCOUNTER — Ambulatory Visit: Admitting: Dermatology

## 2024-04-14 DIAGNOSIS — L821 Other seborrheic keratosis: Secondary | ICD-10-CM | POA: Diagnosis not present

## 2024-04-14 DIAGNOSIS — L578 Other skin changes due to chronic exposure to nonionizing radiation: Secondary | ICD-10-CM

## 2024-04-14 DIAGNOSIS — Z7189 Other specified counseling: Secondary | ICD-10-CM

## 2024-04-14 DIAGNOSIS — Z85828 Personal history of other malignant neoplasm of skin: Secondary | ICD-10-CM

## 2024-04-14 DIAGNOSIS — L814 Other melanin hyperpigmentation: Secondary | ICD-10-CM

## 2024-04-14 DIAGNOSIS — L409 Psoriasis, unspecified: Secondary | ICD-10-CM | POA: Diagnosis not present

## 2024-04-14 DIAGNOSIS — W908XXA Exposure to other nonionizing radiation, initial encounter: Secondary | ICD-10-CM

## 2024-04-14 DIAGNOSIS — L4050 Arthropathic psoriasis, unspecified: Secondary | ICD-10-CM

## 2024-04-14 DIAGNOSIS — Z1283 Encounter for screening for malignant neoplasm of skin: Secondary | ICD-10-CM

## 2024-04-14 DIAGNOSIS — D229 Melanocytic nevi, unspecified: Secondary | ICD-10-CM

## 2024-04-14 DIAGNOSIS — D489 Neoplasm of uncertain behavior, unspecified: Secondary | ICD-10-CM

## 2024-04-14 DIAGNOSIS — Z79899 Other long term (current) drug therapy: Secondary | ICD-10-CM

## 2024-04-14 DIAGNOSIS — D225 Melanocytic nevi of trunk: Secondary | ICD-10-CM

## 2024-04-14 DIAGNOSIS — Z86018 Personal history of other benign neoplasm: Secondary | ICD-10-CM

## 2024-04-14 NOTE — Patient Instructions (Addendum)
 Biopsy Wound Care Instructions  Leave the original bandage on for 24 hours if possible.  If the bandage becomes soaked or soiled before that time, it is OK to remove it and examine the wound.  A small amount of post-operative bleeding is normal.  If excessive bleeding occurs, remove the bandage, place gauze over the site and apply continuous pressure (no peeking) over the area for 30 minutes. If this does not work, please call our clinic as soon as possible or page your doctor if it is after hours.   Once a day, cleanse the wound with soap and water. It is fine to shower. If a thick crust develops you may use a Q-tip dipped into dilute hydrogen peroxide (mix 1:1 with water) to dissolve it.  Hydrogen peroxide can slow the healing process, so use it only as needed.    After washing, apply petroleum jelly (Vaseline) or an antibiotic ointment if your doctor prescribed one for you, followed by a bandage.    For best healing, the wound should be covered with a layer of ointment at all times. If you are not able to keep the area covered with a bandage to hold the ointment in place, this may mean re-applying the ointment several times a day.  Continue this wound care until the wound has healed and is no longer open.   Itching and mild discomfort is normal during the healing process. However, if you develop pain or severe itching, please call our office.   If you have any discomfort, you can take Tylenol (acetaminophen) or ibuprofen as directed on the bottle. (Please do not take these if you have an allergy to them or cannot take them for another reason).  Some redness, tenderness and white or yellow material in the wound is normal healing.  If the area becomes very sore and red, or develops a thick yellow-green material (pus), it may be infected; please notify us .    If you have stitches, return to clinic as directed to have the stitches removed. You will continue wound care for 2-3 days after the stitches  are removed.   Wound healing continues for up to one year following surgery. It is not unusual to experience pain in the scar from time to time during the interval.  If the pain becomes severe or the scar thickens, you should notify the office.    A slight amount of redness in a scar is expected for the first six months.  After six months, the redness will fade and the scar will soften and fade.  The color difference becomes less noticeable with time.  If there are any problems, return for a post-op surgery check at your earliest convenience.  To improve the appearance of the scar, you can use silicone scar gel, cream, or sheets (such as Mederma or Serica) every night for up to one year. These are available over the counter (without a prescription).  Please call our office at [phone] for any questions or concerns.       Melanoma ABCDEs  Melanoma is the most dangerous type of skin cancer, and is the leading cause of death from skin disease.  You are more likely to develop melanoma if you: Have light-colored skin, light-colored eyes, or red or blond hair Spend a lot of time in the sun Tan regularly, either outdoors or in a tanning bed Have had blistering sunburns, especially during childhood Have a close family member who has had a melanoma Have atypical moles  or large birthmarks  Early detection of melanoma is key since treatment is typically straightforward and cure rates are extremely high if we catch it early.   The first sign of melanoma is often a change in a mole or a new dark spot.  The ABCDE system is a way of remembering the signs of melanoma.  A for asymmetry:  The two halves do not match. B for border:  The edges of the growth are irregular. C for color:  A mixture of colors are present instead of an even brown color. D for diameter:  Melanomas are usually (but not always) greater than 6mm - the size of a pencil eraser. E for evolution:  The spot keeps changing in  size, shape, and color.  Please check your skin once per month between visits. You can use a small mirror in front and a large mirror behind you to keep an eye on the back side or your body.   If you see any new or changing lesions before your next follow-up, please call to schedule a visit.  Please continue daily skin protection including broad spectrum sunscreen SPF 30+ to sun-exposed areas, reapplying every 2 hours as needed when you're outdoors.   Staying in the shade or wearing long sleeves, sun glasses (UVA+UVB protection) and wide brim hats (4-inch brim around the entire circumference of the hat) are also recommended for sun protection.     Due to recent changes in healthcare laws, you may see results of your pathology and/or laboratory studies on MyChart before the doctors have had a chance to review them. We understand that in some cases there may be results that are confusing or concerning to you. Please understand that not all results are received at the same time and often the doctors may need to interpret multiple results in order to provide you with the best plan of care or course of treatment. Therefore, we ask that you please give us  2 business days to thoroughly review all your results before contacting the office for clarification. Should we see a critical lab result, you will be contacted sooner.   If You Need Anything After Your Visit  If you have any questions or concerns for your doctor, please call our main line at [phone] and press option 4 to reach your doctor's medical assistant. If no one answers, please leave a voicemail as directed and we will return your call as soon as possible. Messages left after 4 pm will be answered the following business day.   You may also send us  a message via MyChart. We typically respond to MyChart messages within 1-2 business days.  For prescription refills, please ask your pharmacy to contact our office. Our fax number is  [phone].  If you have an urgent issue when the clinic is closed that cannot wait until the next business day, you can page your doctor at the number below.    Please note that while we do our best to be available for urgent issues outside of office hours, we are not available 24/7.   If you have an urgent issue and are unable to reach us , you may choose to seek medical care at your doctor's office, retail clinic, urgent care center, or emergency room.  If you have a medical emergency, please immediately call 911 or go to the emergency department.  Pager Numbers  - Dr. Hester: [phone]  - Dr. Jackquline: [phone]  - Dr. Claudene: [phone]   - Dr. Raymund:  [phone]  In the event of inclement weather, please call our main line at [phone] for an update on the status of any delays or closures.  Dermatology Medication Tips: Please keep the boxes that topical medications come in in order to help keep track of the instructions about where and how to use these. Pharmacies typically print the medication instructions only on the boxes and not directly on the medication tubes.   If your medication is too expensive, please contact our office at [phone] option 4 or send us  a message through MyChart.   We are unable to tell what your co-pay for medications will be in advance as this is different depending on your insurance coverage. However, we may be able to find a substitute medication at lower cost or fill out paperwork to get insurance to cover a needed medication.   If a prior authorization is required to get your medication covered by your insurance company, please allow us  1-2 business days to complete this process.  Drug prices often vary depending on where the prescription is filled and some pharmacies may offer cheaper prices.  The website www.goodrx.com contains coupons for medications through different pharmacies. The prices here do not account for what the cost  may be with help from insurance (it may be cheaper with your insurance), but the website can give you the price if you did not use any insurance.  - You can print the associated coupon and take it with your prescription to the pharmacy.  - You may also stop by our office during regular business hours and pick up a GoodRx coupon card.  - If you need your prescription sent electronically to a different pharmacy, notify our office through [facility] or by phone at [phone] option 4.     Si Usted Necesita Algo Despus de Su Visita  Tambin puede enviarnos un mensaje a travs de Clinical cytogeneticist. Por lo general respondemos a los mensajes de MyChart en el transcurso de 1 a 2 das hbiles.  Para renovar recetas, por favor pida a su farmacia que se ponga en contacto con nuestra oficina. Randi lakes de fax es [place] [phone].  Si tiene un asunto urgente cuando la clnica est cerrada y que no puede esperar hasta el siguiente da hbil, puede llamar/localizar a su doctor(a) al nmero que aparece a continuacin.   Por favor, tenga en cuenta que aunque hacemos todo lo posible para estar disponibles para asuntos urgentes fuera del horario de [place], no estamos disponibles las 24 horas del da, los 7 [address]  [address] de la [place].   Si tiene un problema urgente y no puede comunicarse con nosotros, puede optar por buscar atencin mdica  en el consultorio de su doctor(a), en una clnica privada, en un centro de atencin urgente o en una sala de emergencias.  Si tiene Engineer, drilling, por favor llame inmediatamente al 911 o vaya a la sala de emergencias.  Nmeros de bper  - Dr. Hester: [phone]  - Dra. Jackquline: [phone]  - Dr. Claudene: [phone]  - Dra. Kitts: [phone]  En caso de inclemencias del [place], por favor llame a nuestra lnea principal al [phone] para una actualizacin sobre el estado de cualquier retraso o cierre.  Consejos para la medicacin en dermatologa: Por  favor, guarde las cajas en las que vienen los medicamentos de uso tpico para ayudarle a seguir las instrucciones sobre dnde y cmo usarlos. Las farmacias generalmente imprimen las instrucciones del medicamento slo en las cajas y no directamente  en los tubos del medicamento.   Si su medicamento es muy caro, por favor, pngase en contacto con landry rieger llamando al [phone] y presione la opcin 4 o envenos un mensaje a travs de Clinical cytogeneticist.   No podemos decirle cul ser su copago por los medicamentos por adelantado ya que esto es diferente dependiendo de la cobertura de su seguro. Sin embargo, es posible que podamos encontrar un medicamento sustituto a Audiological scientist un formulario para que el seguro cubra el medicamento que se considera necesario.   Si se requiere una autorizacin previa para que su compaa de seguros malta su medicamento, por favor permtanos de 1 a 2 das hbiles para completar este proceso.  Los precios de los medicamentos varan con frecuencia dependiendo del Environmental consultant de dnde se surte la receta y alguna farmacias pueden ofrecer precios ms baratos.  El sitio web www.goodrx.com tiene cupones para medicamentos de Health and safety inspector. Los precios aqu no tienen en cuenta lo que podra costar con la ayuda del seguro (puede ser ms barato con su seguro), pero el sitio web puede darle el precio si no utiliz Tourist information centre manager.  - Puede imprimir el cupn correspondiente y llevarlo con su receta a la farmacia.  - Tambin puede pasar por nuestra oficina durante el horario de atencin regular y Education officer, museum una tarjeta de cupones de GoodRx.  - Si necesita que su receta se enve electrnicamente a una farmacia diferente, informe a nuestra oficina a travs de MyChart de [place] o por telfono llamando al [phone] y presione la opcin 4.

## 2024-04-14 NOTE — Progress Notes (Unsigned)
 Follow-Up Visit   Subjective  Latoya Lutz is a 53 y.o. female who presents for the following: Skin Cancer Screening and Full Body Skin Exam Hx of bcc Hx of dysplastic nevus  Hx of psoriasis and psoriatic arthritis   Followed by Dr. Jeannetta her rheumatologist for psoriatic arthritis on tremfya    The patient presents for Total-Body Skin Exam (TBSE) for skin cancer screening and mole check. The patient has spots, moles and lesions to be evaluated, some may be new or changing and the patient may have concern these could be cancer.  The following portions of the chart were reviewed this encounter and updated as appropriate: medications, allergies, medical history  Review of Systems:  No other skin or systemic complaints except as noted in HPI or Assessment and Plan.  Objective  Well appearing patient in no apparent distress; mood and affect are within normal limits.  A full examination was performed including scalp, head, eyes, ears, nose, lips, neck, chest, axillae, abdomen, back, buttocks, bilateral upper extremities, bilateral lower extremities, hands, feet, fingers, toes, fingernails, and toenails. All findings within normal limits unless otherwise noted below.   Relevant physical exam findings are noted in the Assessment and Plan.  left mid upper back paraspinal 1.1 cm dark brown macule    Assessment & Plan   HISTORY OF BASAL CELL CARCINOMA OF THE SKIN. 12/29/2008 Left low back  - No evidence of recurrence today - Recommend regular full body skin exams - Recommend daily broad spectrum sunscreen SPF 30+ to sun-exposed areas, reapply every 2 hours as needed.  - Call if any new or changing lesions are noted between office visits   HISTORY OF DYSPLASTIC NEVI 04/05/2021 right posterior medial thigh near popliteal - moderate to severe atypia  No evidence of recurrence today Recommend regular full body skin exams Recommend daily broad spectrum sunscreen SPF 30+ to sun-exposed  areas, reapply every 2 hours as needed.  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 05/25/2020. 10/27/2019 - left labia mucosa  Reviewed notes/records from [facility] visits. Patient was seen by [facility] and by  Dr. Arvella Gold [facility] clinic and had additional biopsies done. She states they were not concerned about the results and patient hasn't noticed recurrence.  Recommend regular self exams Call if any new or changing lesions are noted between office visits  Continue annual vaginal exams with GYN or PCP. -patient states today that she continues to follow with her urology PA Clotilda Cornwall who sees her every 5 months for a genital exam and has not seen any new or changing spots at any of her visits.  Recommend following with Clotilda Cornwall, PA-C, who she sees every 5 months for genital exam.    SKIN CANCER SCREENING PERFORMED TODAY.  ACTINIC DAMAGE - Chronic condition, secondary to cumulative UV/sun exposure - diffuse scaly erythematous macules with underlying dyspigmentation - Recommend daily broad spectrum sunscreen SPF 30+ to sun-exposed areas, reapply every 2 hours as needed.  - Staying in the shade or wearing long sleeves, sun glasses (UVA+UVB protection) and wide brim hats (4-inch brim around the entire circumference of the hat) are also recommended for sun protection.  - Call for new or changing lesions.  LENTIGINES, SEBORRHEIC KERATOSES, HEMANGIOMAS - Benign normal skin lesions - Benign-appearing - Call for any changes  MELANOCYTIC NEVI 1.1 cm brown thin papule at mid back 0.8 cm brown thin papule  at left back 0.8 cm brown thin papule at right back  - Tan-brown and/or pink-flesh-colored symmetric macules and papules - Benign appearing on exam today - Observation - Call clinic for new or changing moles - Recommend daily use of broad spectrum spf 30+ sunscreen to  sun-exposed areas.    PSORIASIS With Psoriatic Arthritis  Currently on Tremfya  per her Rheumatologist, Dr Jeannetta. Exam: Well-demarcated erythematous papules/plaques with silvery scale, guttate pink scaly papules. 2% BSA. Patient states Tremfya  has helped with stiffness in joints and her skin psoriasis is mostly resolved. Has history of osteoarthritis  If joints do not continue to improve could consider Rinvoq as an option   Chronic and persistent condition with duration or expected duration over one year. Condition is improving with treatment but not currently at goal.   Patient states she have psoriatic arthritis   Psoriasis is a chronic non-curable, but treatable genetic/hereditary disease that may have other systemic features affecting other organ systems such as joints (Psoriatic Arthritis). It is associated with an increased risk of inflammatory bowel disease, heart disease, non-alcoholic fatty liver disease, and depression.  Treatments include light and laser treatments; topical medications; and systemic medications including oral and injectables.   Treatment Plan: Continue Zoryve  cream once daily to affected areas of skin Currently On Tremfya  for joints    NEOPLASM OF UNCERTAIN BEHAVIOR left mid upper back paraspinal Epidermal / dermal shaving  Lesion diameter (cm):  1.1 Informed consent: discussed and consent obtained   Timeout: patient name, date of birth, surgical site, and procedure verified   Procedure prep:  Patient was prepped and draped in usual sterile fashion Prep type:  Isopropyl alcohol Anesthesia: the lesion was anesthetized in a standard fashion   Anesthetic:  1% lidocaine  w/ epinephrine 1-100,000 buffered w/ 8.4% NaHCO3 Instrument used: flexible razor blade   Hemostasis achieved with: pressure, aluminum chloride and electrodesiccation   Outcome: patient tolerated procedure well   Post-procedure details: sterile dressing applied and wound care instructions given    Dressing type: bandage and petrolatum    Specimen 1 - Surgical pathology Differential Diagnosis: nevus r/o atypia   Check Margins: yes Return in about 1 year (around 04/14/2025) for TBSE.  IEleanor Blush, CMA, am acting as scribe for Alm Rhyme, MD.   Documentation: I have reviewed the above documentation for accuracy and completeness, and I agree with the above.  Alm Rhyme, MD

## 2024-04-15 ENCOUNTER — Encounter: Payer: Self-pay | Admitting: Dermatology

## 2024-04-15 DIAGNOSIS — H5213 Myopia, bilateral: Secondary | ICD-10-CM | POA: Diagnosis not present

## 2024-04-15 DIAGNOSIS — H52223 Regular astigmatism, bilateral: Secondary | ICD-10-CM | POA: Diagnosis not present

## 2024-04-15 DIAGNOSIS — H524 Presbyopia: Secondary | ICD-10-CM | POA: Diagnosis not present

## 2024-04-15 DIAGNOSIS — H35411 Lattice degeneration of retina, right eye: Secondary | ICD-10-CM | POA: Diagnosis not present

## 2024-04-16 ENCOUNTER — Ambulatory Visit: Payer: 59 | Admitting: Dermatology

## 2024-04-20 ENCOUNTER — Ambulatory Visit: Payer: Self-pay | Admitting: Dermatology

## 2024-04-20 LAB — SURGICAL PATHOLOGY

## 2024-04-21 ENCOUNTER — Encounter: Payer: Self-pay | Admitting: Dermatology

## 2024-04-21 NOTE — Telephone Encounter (Addendum)
 Tried calling patient regarding results. No answer. LM for patient to return call.  ----- Message from Alm Rhyme sent at 04/20/2024  5:49 PM EST ----- FINAL DIAGNOSIS        1. Skin, left mid upper back paraspinal :       DYSPLASTIC COMPOUND NEVUS WITH SEVERE ATYPIA, DEEP MARGIN INVOLVED, SEE       DESCRIPTION    Severe Dysplastic Schedule surgery ----- Message ----- From: Interface, Lab In Three [place] Seven Sent: 04/20/2024   4:26 PM EST To: Alm JAYSON Rhyme, MD

## 2024-04-21 NOTE — Telephone Encounter (Signed)
 Biopsy results discussed with patient, surgery scheduled with Dr Raymund 05/19/24

## 2024-04-21 NOTE — Telephone Encounter (Signed)
-----   Message from Alm Rhyme sent at 04/20/2024  5:49 PM EST ----- FINAL DIAGNOSIS        1. Skin, left mid upper back paraspinal :       DYSPLASTIC COMPOUND NEVUS WITH SEVERE ATYPIA, DEEP MARGIN INVOLVED, SEE       DESCRIPTION    Severe Dysplastic Schedule surgery ----- Message ----- From: Interface, Lab In Three [facility] Seven Sent: 04/20/2024   4:26 PM EST To: Alm JAYSON Rhyme, MD

## 2024-04-24 ENCOUNTER — Other Ambulatory Visit: Payer: Self-pay

## 2024-04-27 ENCOUNTER — Encounter: Payer: Self-pay | Admitting: Internal Medicine

## 2024-04-27 ENCOUNTER — Other Ambulatory Visit: Payer: Self-pay

## 2024-04-28 ENCOUNTER — Telehealth (INDEPENDENT_AMBULATORY_CARE_PROVIDER_SITE_OTHER): Payer: Self-pay | Admitting: Nurse Practitioner

## 2024-04-28 NOTE — Telephone Encounter (Signed)
 Spoke to pt about scheduling laser with Marea Mayo MD. Pt states she did not really want to schedule at this time but would call back when she was ready. Pt stated maybe she would call back after the new year. Pt was informed that that the prior authorization does run out on 08/20/24, pt advised understanding.    left leg GSV laser. see jd. auth # [id number] exp: 9.29.25 - 3.12.26    1 week post left leg GSV laser    4 week post left leg GSV laser. see fb/bp

## 2024-05-01 DIAGNOSIS — M1712 Unilateral primary osteoarthritis, left knee: Secondary | ICD-10-CM | POA: Diagnosis not present

## 2024-05-01 DIAGNOSIS — M25462 Effusion, left knee: Secondary | ICD-10-CM | POA: Diagnosis not present

## 2024-05-19 ENCOUNTER — Telehealth: Payer: Self-pay

## 2024-05-19 ENCOUNTER — Encounter: Admitting: Dermatology

## 2024-05-19 ENCOUNTER — Ambulatory Visit

## 2024-05-19 DIAGNOSIS — M7121 Synovial cyst of popliteal space [Baker], right knee: Secondary | ICD-10-CM | POA: Diagnosis not present

## 2024-05-19 DIAGNOSIS — L988 Other specified disorders of the skin and subcutaneous tissue: Secondary | ICD-10-CM | POA: Diagnosis not present

## 2024-05-19 DIAGNOSIS — D225 Melanocytic nevi of trunk: Secondary | ICD-10-CM | POA: Diagnosis not present

## 2024-05-19 DIAGNOSIS — D235 Other benign neoplasm of skin of trunk: Secondary | ICD-10-CM

## 2024-05-19 NOTE — Telephone Encounter (Signed)
 Called patient to follow up post dysplastic nevus excision this morning. She states she is doing well and denied questions or concerns at this time.

## 2024-05-19 NOTE — Progress Notes (Signed)
 Subjective   Latoya Lutz is a 53 y.o. female who presents for the following: Surgical excision of Severely dysplastic nevus.  Pacemaker/Defibrillator: Denies  Allergies: Denies  Anticoagulants/Aspirin: Denies  Heart valves: Denies  Joint replacements:  Denies    . Patient is established patient   Today patient reports: Surgical excision of severely dysplastic nevus.   Review of Systems:    No other skin or systemic complaints except as noted in HPI or Assessment and Plan.  The following portions of the chart were reviewed this encounter and updated as appropriate: medications, allergies, medical history  Relevant Medical History:  Personal history of non melanoma skin cancer - see medical history for full details   Objective  (SKPE) Well appearing patient in no apparent distress; mood and affect are within normal limits. Examination was performed of the: Focused Exam of: left mid upper back paraspinal   Examination notable for: Well healed biopsy site.   left mid upper back paraspinal 1.1 cm pink bx site  Assessment & Plan  (SKAP)   Procedures, orders, diagnosis for this visit:  DYSPLASTIC NEVUS left mid upper back paraspinal Skin excision - left mid upper back paraspinal  Excision method:  elliptical Lesion length (cm):  1.1 Lesion width (cm):  0.5 Margin per side (cm):  0.3 Total excision diameter (cm):  1.7 Informed consent: discussed and consent obtained   Timeout: patient name, date of birth, surgical site, and procedure verified   Procedure prep:  Patient was prepped and draped in usual sterile fashion Prep type:  Chlorhexidine Anesthesia: the lesion was anesthetized in a standard fashion   Anesthetic:  1% lidocaine  w/ epinephrine 1-100,000 buffered w/ 8.4% NaHCO3 Instrument used: #15 blade   Hemostasis achieved with: suture, pressure and electrodesiccation   Outcome: patient tolerated procedure well with no complications    Skin repair - left mid  upper back paraspinal Complexity:  Intermediate Final length (cm):  3.9 Informed consent: discussed and consent obtained   Timeout: patient name, date of birth, surgical site, and procedure verified   Procedure prep:  Patient was prepped and draped in usual sterile fashion Prep type:  Chlorhexidine Anesthesia: the lesion was anesthetized in a standard fashion   Anesthetic:  1% lidocaine  w/ epinephrine 1-100,000 buffered w/ 8.4% NaHCO3 Reason for type of repair: reduce tension to allow closure, reduce the risk of dehiscence, infection, and necrosis, reduce subcutaneous dead space and avoid a hematoma, allow closure of the large defect and preserve normal anatomy   Undermining: edges could be approximated without difficulty   Subcutaneous layers (deep stitches):  Suture size:  3-0 Suture type: Vicryl (polyglactin 910)   Stitches:  Buried vertical mattress Fine/surface layer approximation (top stitches):  Suture size:  4-0 Suture type: Prolene (polypropylene)   Stitches comment:  Running locked Hemostasis achieved with: suture, pressure and electrodesiccation Outcome: patient tolerated procedure well with no complications   Post-procedure details: sterile dressing applied and wound care instructions given   Dressing type: petrolatum, bandage and pressure dressing    Specimen 1 - Surgical pathology Differential Diagnosis: Bx proven severely dysplastic nevus Accession: [id number] Superior tag Check Margins: Yes  Dysplastic nevus -     Skin excision -     Skin repair -     Surgical pathology; Standing   Return to clinic: Return 7-10 days, for suture removal.  I, Rosina Mayans, CMA, am acting as scribe for Lauraine JAYSON Kanaris, MD .  Documentation: I have reviewed the above documentation for  accuracy and completeness, and I agree with the above.  Lauraine JAYSON Kanaris, MD

## 2024-05-19 NOTE — Patient Instructions (Signed)
 POST-OPERATIVE WOUND CARE INSTRUCTIONS  1.  Please avoid strenuous activity for 48 hours and all heavy exercise for 1 week. Take it easy for 2 weeks after your surgery. Heavy activity can cause bleeding and separation of your wound.  If your wound is on your arm, leg, or shoulder, then avoid heavy lifting, straining, or exercise with that limb for at least one week. If your wound is on the head or neck, avoid bending over 2. Please do not smoke for 3 weeks; smoking prevents healthy wound healing. 3. Keep the wound elevated as much as possible; if the wound is on your face, try to sleep in a recliner or on extra pillows (do NOT sleep face-down). 4. Typical post-op pain consists of soreness or mild tenderness usually lasting less than 48 hours.  This is often relieved with 2 extra-strength Tylenol pills every 4-6 hours.   Pain should decrease steadily for the 1st few days after your surgery.  If it does not, please call our office. Tips for pain relief:  If not relieved by tylenol alone, I recommend Ibuprofen 400mg  every 6 hours alternating with acetaminophen 650mg  every 6 hours (not to exceed 3000mg ) thus essentially taking one medicine every 3 hours, cold analgesia which is applied to the surgical site for 10 to 20 minutes, rest and site elevation. Please discuss with physician if you have kidney disease, liver disease, or peptic ulcer disease before ingesting regimen above. 5. If bleeding occurs, apply firm pressure for 20 minutes (timed by the clock). You can also use an ice-pack to decrease the swelling and bleeding.  If it persists, apply pressure for one more round of 20 minutes.  If it continues, call our office or go the nearest ER.   For surgical wounds closed with DISSOLVING stitches: 1. Please keep the original bandage in place for at least 24 hours. You can remove the large outer layer at that time and keep the thin bandage clean and dry for one week (face) or two weeks (body/arms/legs) or  until you return.  If the bandage has become soaked with blood or has loosened before this time, you may replace it with a clean bandage.  2. Usually, we will remove the inner flat bandage at your follow-up in approximately 1 week (face) or two weeks (body/arms/legs) unless otherwise noted. 3. Then follow the cleaning instructions below after all bandaging is removed.  For surgical wounds requiring STITCH REMOVAL or OPEN WOUNDS allowed to heal without stitches: Remove the original bandage in 48 hours (2 days). You may leave the smaller bandage (steri-strips) in place until it falls off by itself. 2.   It is okay to let soap and water run down onto the wound - don't scrub the wound area too vigorously 3. Then follow cleaning instructions as below.  Do not use triple antibiotic ointment, Neosporin, or hydrogen peroxide. Cleaning: Do the following every day (TWICE A DAY) for 2 weeks after bandages are removed:  remove the bandage that was placed at your follow-up.  You can wet the bandage to make it easier to remove if needed.  It is normal to have dried blood or a small amount of bleeding when the bandage is removed. If Steri-strips were used, do not peel them off (they will fall off on their own).   clean the surgical area with a vinegar/water solution   1 tablespoon of white vinegar in 1 cup of water (you can make as much of the  mixture as you  need). Cool this mixture (refrigerator).  Soak a washcloth or cotton balls/Q-tips in this solution.  Apply to wound area for  5-10 minutes once to twice a day. Gently pat dry. Crusts and scabs slow down  wound healing so you can gently use a Q-tip soaked in the vinegar solution to  remove the crusts.   apply Vaseline ointment to the wound and over any incision lines, stitches, and the surrounding area. Be generous with the Vaseline and do not let the wound become dry and crusted.  cover with a bandaid/bandage if needed, especially if you will be in a dirty or  dusty environment.  Check the wound for extensive swelling, bleeding or redness. It is normal to have some mild oozing, swelling and redness for a few days after surgery. If the wound appears  infected (increasing redness, significant warmth, extreme tenderness, thick yellow discharge), call our office.  Possible complications from surgery:  Bleeding: a small amount of blood on the bandage is normal. Significant bleeding into the bandage or beneath the stitches will look like swelling and purple discoloration. For bleeding, apply firm pressure with a cloth or bandage for 20 minutes without interruption. Repeat if bleeding continues. If it persists, please call [phone]. During nonoffice hours use the message of this number to contact the on-call dermatologist or go to the emergency room.   Infection: usually indicated by pain that increases 4 to 6 days after surgery. Mild redness, swelling, soreness are normal, but with increasing redness, pain, drainage, and swelling you should contact our office.   Sensation changes: Decreased sensation, increased sensitivity, and itching may last for 18 months. Numbness in the surgical area is expected, and it may take 12-18 months for the feeling to return to normal. During this time sensations of itching, tingling, and occasional sharp pains might be noted. These feelings are normal and will subside once the nerves in the skin have healed.  Scarring: scars mature for one year after surgery. Reducing motion and tension over the wound for the 1st month, as well as sun protection, reduces scarring.   Bruising is normal around the wound and resolves over 2 to 3 weeks.  Important tips and points: *The better your wound care, the better the final appearance of your surgical site and any resulting scar. *Although your wound may appear healed in 1-2 weeks, it takes at least 3 months for complete healing and 12 months for it to take its final appearance. *By  limiting the tension across your wound for 3 months, you will minimize any wide "spread scar" appearance.  Therefore, try to avoid any strenuous exercise or heavy lifting for at least 1 month. Wounds regain only 40% of their strength at 1 month (and only 80% at one year). *For wounds around the lips and mouth, avoid any wide opening.sorry, no biting into an apple or hamburger for a while. Try to cut up your food and take little bites. *Avoid sun exposure to your wound and scar to prevent any discoloration or prolonged redness.  You may use sunscreen after it has healed with new skin. Cover it up with Vaseline and a band-aid until new skin has appeared.  Please return as scheduled-- usually 1 week (face) and 2 weeks (body/arms/legs).  At that visit we will check to ensure the wound is healing appropriately.  We will also discuss the use of silicone sheets to be applied to the area to help reduce scar.  This is the only scar reducing product  that has some evidence to support it's use.  Mederma and vitamin E do not have good evidence to support their use.     Due to recent changes in healthcare laws, you may see results of your pathology and/or laboratory studies on MyChart before the doctors have had a chance to review them. We understand that in some cases there may be results that are confusing or concerning to you. Please understand that not all results are received at the same time and often the doctors may need to interpret multiple results in order to provide you with the best plan of care or course of treatment. Therefore, we ask that you please give us  2 business days to thoroughly review all your results before contacting the office for clarification. Should we see a critical lab result, you will be contacted sooner.   If You Need Anything After Your Visit  If you have any questions or concerns for your doctor, please call our main line at [phone] and press option 4 to reach your doctor's  medical assistant. If no one answers, please leave a voicemail as directed and we will return your call as soon as possible. Messages left after 4 pm will be answered the following business day.   You may also send us  a message via MyChart. We typically respond to MyChart messages within 1-2 business days.  For prescription refills, please ask your pharmacy to contact our office. Our fax number is [phone].  If you have an urgent issue when the clinic is closed that cannot wait until the next business day, you can page your doctor at the number below.    Please note that while we do our best to be available for urgent issues outside of office hours, we are not available 24/7.   If you have an urgent issue and are unable to reach us , you may choose to seek medical care at your doctor's office, retail clinic, urgent care center, or emergency room.  If you have a medical emergency, please immediately call 911 or go to the emergency department.  Pager Numbers  - Dr. Hester: [phone]  - Dr. Jackquline: [phone]  - Dr. Claudene: [phone]   - Dr. Raymund: [phone]  In the event of inclement weather, please call our main line at [phone] for an update on the status of any delays or closures.  Dermatology Medication Tips: Please keep the boxes that topical medications come in in order to help keep track of the instructions about where and how to use these. Pharmacies typically print the medication instructions only on the boxes and not directly on the medication tubes.   If your medication is too expensive, please contact our office at [phone] option 4 or send us  a message through MyChart.   We are unable to tell what your co-pay for medications will be in advance as this is different depending on your insurance coverage. However, we may be able to find a substitute medication at lower cost or fill out paperwork to get insurance to cover a needed medication.   If a  prior authorization is required to get your medication covered by your insurance company, please allow us  1-2 business days to complete this process.  Drug prices often vary depending on where the prescription is filled and some pharmacies may offer cheaper prices.  The website www.goodrx.com contains coupons for medications through different pharmacies. The prices here do not account for what the cost may be with help from insurance (it  may be cheaper with your insurance), but the website can give you the price if you did not use any insurance.  - You can print the associated coupon and take it with your prescription to the pharmacy.  - You may also stop by our office during regular business hours and pick up a GoodRx coupon card.  - If you need your prescription sent electronically to a different pharmacy, notify our office through [facility] or by phone at [phone] option 4.     Si Usted Necesita Algo Despus de Su Visita  Tambin puede enviarnos un mensaje a travs de Clinical cytogeneticist. Por lo general respondemos a los mensajes de MyChart en el transcurso de 1 a 2 das hbiles.  Para renovar recetas, por favor pida a su farmacia que se ponga en contacto con nuestra oficina. Randi lakes de fax es [place] [phone].  Si tiene un asunto urgente cuando la clnica est cerrada y que no puede esperar hasta el siguiente da hbil, puede llamar/localizar a su doctor(a) al nmero que aparece a continuacin.   Por favor, tenga en cuenta que aunque hacemos todo lo posible para estar disponibles para asuntos urgentes fuera del horario de [place], no estamos disponibles las 24 horas del da, los 7 [address]  [address] de la [place].   Si tiene un problema urgente y no puede comunicarse con nosotros, puede optar por buscar atencin mdica  en el consultorio de su doctor(a), en una clnica privada, en un centro de atencin urgente o en una sala de emergencias.  Si tiene Engineer, drilling, por favor llame  inmediatamente al 911 o vaya a la sala de emergencias.  Nmeros de bper  - Dr. Hester: [phone]  - Dra. Jackquline: [phone]  - Dr. Claudene: [phone]  - Dra. Kitts: [phone]  En caso de inclemencias del [place], por favor llame a nuestra lnea principal al [phone] para una actualizacin sobre el estado de cualquier retraso o cierre.  Consejos para la medicacin en dermatologa: Por favor, guarde las cajas en las que vienen los medicamentos de uso tpico para ayudarle a seguir las instrucciones sobre dnde y cmo usarlos. Las farmacias generalmente imprimen las instrucciones del medicamento slo en las cajas y no directamente en los tubos del [place].   Si su medicamento es muy caro, por favor, pngase en contacto con landry rieger llamando al [phone] y presione la opcin 4 o envenos un mensaje a travs de Clinical cytogeneticist.   No podemos decirle cul ser su copago por los medicamentos por adelantado ya que esto es diferente dependiendo de la cobertura de su seguro. Sin embargo, es posible que podamos encontrar un medicamento sustituto a Audiological scientist un formulario para que el seguro cubra el medicamento que se considera necesario.   Si se requiere una autorizacin previa para que su compaa de seguros malta su medicamento, por favor permtanos de 1 a 2 das hbiles para completar este proceso.  Los precios de los medicamentos varan con frecuencia dependiendo del Environmental consultant de dnde se surte la receta y alguna farmacias pueden ofrecer precios ms baratos.  El sitio web www.goodrx.com tiene cupones para medicamentos de Health and safety inspector. Los precios aqu no tienen en cuenta lo que podra costar con la ayuda del seguro (puede ser ms barato con su seguro), pero el sitio web puede darle el precio si no utiliz Tourist information centre manager.  - Puede imprimir el cupn correspondiente y llevarlo con su receta a la farmacia.  - Tambin puede pasar por nuestra oficina durante el  horario  de Visual merchandiser regular y Education officer, museum una tarjeta de cupones de GoodRx.  - Si necesita que su receta se enve electrnicamente a una farmacia diferente, informe a nuestra oficina a travs de MyChart de [place] o por telfono llamando al [phone] y presione la opcin 4.

## 2024-05-21 LAB — SURGICAL PATHOLOGY

## 2024-05-25 ENCOUNTER — Ambulatory Visit: Payer: Self-pay

## 2024-05-25 NOTE — Progress Notes (Signed)
Patient informed and voiced good understanding.

## 2024-05-26 ENCOUNTER — Telehealth: Payer: Self-pay

## 2024-05-26 ENCOUNTER — Other Ambulatory Visit: Payer: Self-pay

## 2024-05-26 NOTE — Telephone Encounter (Signed)
 Received notification from [facility] pharmacy that patient requires a new authorization for their medication.  Submitted an URGENT Prior Authorization request to [employer] for TREMFYA  via CoverMyMeds. Will update once we receive a response.  Key: [id number]

## 2024-05-27 ENCOUNTER — Ambulatory Visit

## 2024-05-27 ENCOUNTER — Other Ambulatory Visit: Payer: Self-pay

## 2024-05-27 DIAGNOSIS — Z4802 Encounter for removal of sutures: Secondary | ICD-10-CM

## 2024-05-27 DIAGNOSIS — Z48817 Encounter for surgical aftercare following surgery on the skin and subcutaneous tissue: Secondary | ICD-10-CM

## 2024-05-27 NOTE — Patient Instructions (Signed)

## 2024-05-27 NOTE — Progress Notes (Signed)
°  °  Subjective   Latoya Lutz is a 53 y.o. female who presents for the following: post op for Dysplastic Nevus excision. Patient is established patient   Today patient reports: no issues from surgery  Review of Systems:    No other skin or systemic complaints except as noted in HPI or Assessment and Plan.  The following portions of the chart were reviewed this encounter and updated as appropriate: medications, allergies, medical history  Relevant Medical History:  n/a   Objective  (SKPE) Well appearing patient in no apparent distress; mood and affect are within normal limits. Examination was performed of the: Focused Exam of: back   Examination notable for: healing excision site at L mid upper back paraspinal Examination limited by: Clothing    Assessment & Plan  (SKAP)   Severe Dysplastic Nevus Margins free Bx proven Encounter for Removal of Sutures - Incision site at the L mid upper back paraspinal is clean, dry and intact - Wound cleansed, sutures removed, wound cleansed. - Discussed pathology results showing Dysplastic Nevus margins free - Continue Vaseline and bandage for 1 more week.  - Scars remodel for a full year. - Once wound fully healed, patient can apply over-the-counter silicone scar cream each night to help with scar remodeling if desired. - Patient advised to call with any concerns or if they notice any new or changing lesions.      Procedures, orders, diagnosis for this visit:    There are no diagnoses linked to this encounter.  Return to clinic: Return for as scheduled with Dr. [place] for TBSE, Hx of BCC, Hx of Dysplastic nevi, Hx of AKs.  I, Grayce Saunas, RMA, am acting as scribe for Lauraine JAYSON Kanaris, MD .   Documentation: I have reviewed the above documentation for accuracy and completeness, and I agree with the above.  Lauraine JAYSON Kanaris, MD

## 2024-05-27 NOTE — Telephone Encounter (Signed)
 Received notification from [facility] regarding a prior authorization for TREMFYA . Authorization has been APPROVED from 05/25/2024 to 05/25/2025. Approval letter sent to scan center.  Patient must continue to fill through [facility]: [phone]   Authorization # [id number]  Sherry Pennant, PharmD, MPH, BCPS, CPP Clinical Pharmacist

## 2024-05-29 ENCOUNTER — Other Ambulatory Visit (HOSPITAL_COMMUNITY): Payer: Self-pay

## 2024-06-03 ENCOUNTER — Other Ambulatory Visit: Payer: Self-pay

## 2024-06-04 ENCOUNTER — Encounter (HOSPITAL_COMMUNITY): Payer: Self-pay

## 2024-06-08 ENCOUNTER — Other Ambulatory Visit (HOSPITAL_COMMUNITY): Payer: Self-pay

## 2024-06-08 ENCOUNTER — Other Ambulatory Visit: Payer: Self-pay

## 2024-06-08 NOTE — Progress Notes (Signed)
 Specialty Pharmacy Refill Coordination Note  Latoya Lutz is a 53 y.o. female contacted today regarding refills of specialty medication(s) Guselkumab    Patient requested Delivery   Delivery date: 06/09/24   Verified address: [address] [place], [place] [address]   Medication will be filled on: 06/08/24

## 2024-06-22 NOTE — Progress Notes (Unsigned)
 "  06/23/2024 2:56 PM   Rollene HERO Lacinda Sep 24, 1970 [id number]  Referring provider:  [name], DO [address] [place],  [place] [address]  Urological history  1. Urethral stricture - diagnosed by Dr. Patrina with urethral dilation and had been receiving serial dilations every 4 to 6 months since 2013 - managed with urethral dilation q 5 months at this time   2. Nephrolithiasis - right ureteral stones treated with ESWL and right URS/LL/right ureteral stent placement and stent removal in winter 2016 - CT Renal stone study performed on 04/05/2017 noted no nephroureterolithiasis.  No hydronephrosis - CT Renal stone study (06/2023) - no stones   Chief Complaint  Patient presents with   Other stricture of urethra in female    HPI: Latoya Lutz is a 54 y.o.female  who presents today for urethral dilation and symptoms recheck.   Previous records reviewed.   Patient started to experience an increase in frequency and urgency.  Patient denies any modifying or aggravating factors.  Patient denies any recent UTI's, gross hematuria, dysuria or suprapubic/flank pain.  Patient denies any fevers, chills, nausea or vomiting.    UA yellow slightly cloudy, specific gravity 1.025, pH 6.0, trace heme, trace leukocyte, 0-5 WBCs, 0-2 RBCs, greater than 10 epithelial cells, amorphous crystals present, mucus is present and few bacteria.  PMH: Past Medical History:  Diagnosis Date   Acid reflux    Actinic keratosis    Allergy    Arthritis    Atypical mole 10/27/2019   Left labia mucosa    Atypical nevus 05/25/2020   R labia majora - atypical proliferation   Candidal dermatitis 05/25/2014   Cervical spine disease    Chicken pox    Chronic urethral narrowing    undergoing stretching   Complication of anesthesia    Diverticulitis    Dysplastic nevus 04/05/2021   R post med thigh near popliteal, mod to severe atypia   Dysplastic nevus 04/15/2024   left mid upper back paraspinal -  severe, excised 05/19/24   Endometriosis    Dr. Ruthell at [facility], removed    Esophageal reflux 05/25/2014   Gross hematuria 04/25/2015   H/O cystoscopy    normal   Heart murmur    Hx of basal cell carcinoma 12/29/2008   L low back   Hypertension    Infection of urinary tract 09/11/2014   Kidney stone 09/11/2014   Kidney stones    Microscopic hematuria 09/11/2014   Obesity, morbid, BMI 40.0-49.9 (HCC) 04/10/2012   PONV (postoperative nausea and vomiting)    Right ureteral stone 04/28/2015   Skin cancer    Urinary retention     Surgical History: Past Surgical History:  Procedure Laterality Date   ABDOMINAL HYSTERECTOMY     BLADDER SURGERY  07/2003   CARPAL TUNNEL RELEASE Right    CESAREAN SECTION  1998   CHOLECYSTECTOMY     COLONOSCOPY WITH PROPOFOL  N/A 08/24/2021   Procedure: COLONOSCOPY WITH PROPOFOL ;  Surgeon: Jinny Carmine, MD;  Location: [facility] ENDOSCOPY;  Service: Endoscopy;  Laterality: N/A;  9 AM ARRIVAL, PLEASE   CYSTOSCOPY W/ RETROGRADES Right 05/16/2015   Procedure: CYSTOSCOPY WITH RETROGRADE PYELOGRAM;  Surgeon: Redell Lynwood Napoleon, MD;  Location: [facility] ORS;  Service: Urology;  Laterality: Right;   CYSTOSCOPY W/ URETERAL STENT PLACEMENT Right 05/16/2015   Procedure: CYSTOSCOPY WITH STENT REPLACEMENT;  Surgeon: Redell Lynwood Napoleon, MD;  Location: [facility] ORS;  Service: Urology;  Laterality: Right;   CYSTOSCOPY WITH STENT PLACEMENT Right  04/29/2015   Procedure: CYSTOSCOPY WITH STENT PLACEMENT;  Surgeon: Redell Lynwood Napoleon, MD;  Location: [facility] ORS;  Service: Urology;  Laterality: Right;   ESOPHAGOGASTRODUODENOSCOPY N/A 08/24/2021   Procedure: ESOPHAGOGASTRODUODENOSCOPY (EGD);  Surgeon: Jinny Carmine, MD;  Location: [facility] ENDOSCOPY;  Service: Endoscopy;  Laterality: N/A;   EXTRACORPOREAL SHOCK WAVE LITHOTRIPSY Left 06/02/2015   Procedure: EXTRACORPOREAL SHOCK WAVE LITHOTRIPSY (ESWL);  Surgeon: Rosina Riis, MD;  Location: [facility] ORS;  Service: Urology;  Laterality: Left;   FOOT  SURGERY  2003   KNEE SURGERY  2005   TONSILLECTOMY     TONSILLECTOMY     lingual growth on vocal cord removal   TOTAL ABDOMINAL HYSTERECTOMY W/ BILATERAL SALPINGOOPHORECTOMY     [facility] complete   URETEROSCOPY WITH HOLMIUM LASER LITHOTRIPSY Right 05/16/2015   Procedure: URETEROSCOPY WITH HOLMIUM LASER LITHOTRIPSY;  Surgeon: Redell Lynwood Napoleon, MD;  Location: [facility] ORS;  Service: Urology;  Laterality: Right;    Home Medications:  Allergies as of 06/23/2024       Reactions   Atorvastatin  Other (See Comments)   Leg cramps        Medication List        Accurate as of June 23, 2024  2:56 PM. If you have any questions, ask your nurse or doctor.          acyclovir  400 MG tablet Commonly known as: ZOVIRAX  Take 1 tablet (400 mg total) by mouth 3 (three) times daily. What changed:  when to take this reasons to take this   albuterol  108 (90 Base) MCG/ACT inhaler Commonly known as: VENTOLIN  HFA Inhale 2 puffs into the lungs every 6 (six) hours as needed for wheezing or shortness of breath.   Azelastine  HCl 137 MCG/SPRAY Soln Place 2 sprays into both nostrils 2 (two) times daily. Use in each nostril as directed What changed:  when to take this reasons to take this   busPIRone  10 MG tablet Commonly known as: BUSPAR  Take 1 tablet (10 mg total) by mouth 3 (three) times daily. What changed:  when to take this reasons to take this   CALCIUM  600 PO Take 1 tablet by mouth daily.   cyclobenzaprine  10 MG tablet Commonly known as: FLEXERIL  Take 1 tablet (10 mg total) by mouth at bedtime. What changed:  when to take this reasons to take this   diclofenac  Sodium 1 % Gel Commonly known as: VOLTAREN  Apply 2 g topically 4 (four) times daily.   ezetimibe  10 MG tablet Commonly known as: ZETIA  Take 1 tablet (10 mg total) by mouth daily.   fexofenadine  180 MG tablet Commonly known as: ALLEGRA Take 180 mg by mouth daily.   fluticasone  50 MCG/ACT nasal spray Commonly known  as: FLONASE  PLACE 2 SPRAYS INTO BOTH NOSTRILS DAILY. What changed:  when to take this reasons to take this   hydrochlorothiazide  25 MG tablet Commonly known as: HYDRODIURIL  Take 1-2 tablets (25-50 mg total) by mouth daily.   Magnesium 100 MG Caps Take 30 mg by mouth 2 (two) times daily.   meloxicam  15 MG tablet Commonly known as: MOBIC  Take 1 tablet (15 mg total) by mouth daily.   Menopause Formula Tabs Take by mouth. Reported on 11/04/2015   pantoprazole  40 MG tablet Commonly known as: PROTONIX  Take 2 tablets (80 mg total) by mouth daily.   pravastatin  20 MG tablet Commonly known as: PRAVACHOL  Take 1 tablet (20 mg total) by mouth 3 (three) times a week. Tuesday,Thursday,Saturday   pseudoephedrine 30 MG tablet Commonly known as:  SUDAFED Take 30 mg by mouth 3 (three) times daily. What changed:  when to take this reasons to take this   Simethicone  180 MG Caps Take 1 capsule (180 mg total) by mouth 3 (three) times daily with meals.   sucralfate  1 g tablet Commonly known as: CARAFATE  Take 1 tablet (1 g total) by mouth 4 (four) times daily -  with meals and at bedtime.   Tremfya  One-Press 100 MG/ML Sopn Generic drug: Guselkumab  Inject 100 mg into the skin every 8 (eight) weeks. **maintenance dose**   VITAMIN D  PO Take by mouth.   vitamin E 180 MG (400 UNITS) capsule Take 400 Units by mouth daily.   Zoryve  0.3 % Crea Generic drug: Roflumilast  Apply once daily to affected areas of psoriasis What changed:  when to take this reasons to take this        Allergies:  Allergies  Allergen Reactions   Atorvastatin  Other (See Comments)    Leg cramps    Family History: Family History  Problem Relation Age of Onset   Cancer Mother        pancreatic cancer   Heart disease Mother        Pacemaker and defib   Hypertension Mother    Hyperlipidemia Mother    Early death Mother    Stroke Mother    Varicose Veins Mother    Cancer Father    COPD Father         Lung and brain cancer   Early death Father    Cancer Maternal Grandmother        Colon   Diabetes Paternal Grandmother    Cirrhosis Paternal Grandmother    Cancer Paternal Grandmother    Kidney disease Neg Hx    Bladder Cancer Neg Hx    Breast cancer Neg Hx     Social History:  reports that she quit smoking about 16 years ago. Her smoking use included cigarettes. She started smoking about 33 years ago. She has a 8.5 pack-year smoking history. She has been exposed to tobacco smoke. She has never used smokeless tobacco. She reports that she does not drink alcohol and does not use drugs.   Physical Exam: Blood pressure (!) 147/79, pulse 76, weight 297 lb (134.7 kg), SpO2 98%.  Constitutional:  Well nourished. Alert and oriented, No acute distress. HEENT: [place] AT, moist mucus membranes.  Trachea midline, no masses. Cardiovascular: No clubbing, cyanosis, or edema. Respiratory: Normal respiratory effort, no increased work of breathing. Neurologic: Grossly intact, no focal deficits, moving all 4 extremities. Psychiatric: Normal mood and affect.      Laboratory Data: See EPIC and HPI I have reviewed the labs.   Pertinent Imaging N/A  Procedure: Patient is placed in stirrups and her urethral meatus and vulva are cleansed with Betadine.  2% Lidocaine  jelly was inserted into her urethra.  I then dilated her with Corbett sounds to a 30 Fr without difficultly.  She tolerated the procedure well.     Assessment & Plan:    1. Urethral stricture -UA unremarkable  -return in 5 months for dilation   Return in about 5 months (around 11/21/2024) for urethral dilation .  CLOTILDA HELON RIGGERS   [facility] [address] [place], [place] [address] [phone] "

## 2024-06-23 ENCOUNTER — Encounter: Payer: Self-pay | Admitting: Urology

## 2024-06-23 ENCOUNTER — Ambulatory Visit: Admitting: Urology

## 2024-06-23 VITALS — BP 147/79 | HR 76 | Wt 297.0 lb

## 2024-06-23 DIAGNOSIS — N3582 Other urethral stricture, female: Secondary | ICD-10-CM

## 2024-06-23 LAB — URINALYSIS, COMPLETE
Bilirubin, UA: NEGATIVE
Glucose, UA: NEGATIVE
Ketones, UA: NEGATIVE
Nitrite, UA: NEGATIVE
Protein,UA: NEGATIVE
Specific Gravity, UA: 1.025 (ref 1.005–1.030)
Urobilinogen, Ur: 0.2 mg/dL (ref 0.2–1.0)
pH, UA: 6 (ref 5.0–7.5)

## 2024-06-23 LAB — MICROSCOPIC EXAMINATION: Epithelial Cells (non renal): >10 /HPF — AB (ref 0–10)

## 2024-06-27 ENCOUNTER — Other Ambulatory Visit: Payer: Self-pay | Admitting: Family Medicine

## 2024-06-27 MED FILL — Sucralfate Tab 1 GM: ORAL | 30 days supply | Qty: 120 | Fill #1 | Status: AC

## 2024-06-28 ENCOUNTER — Other Ambulatory Visit: Payer: Self-pay | Admitting: Family Medicine

## 2024-06-28 ENCOUNTER — Other Ambulatory Visit: Payer: Self-pay

## 2024-06-29 ENCOUNTER — Other Ambulatory Visit: Payer: Self-pay

## 2024-06-29 ENCOUNTER — Encounter: Payer: Self-pay | Admitting: Pharmacist

## 2024-06-29 MED FILL — Ezetimibe Tab 10 MG: ORAL | 90 days supply | Qty: 90 | Fill #0 | Status: CN

## 2024-06-29 NOTE — Telephone Encounter (Signed)
 Requested Prescriptions  Pending Prescriptions Disp Refills   ezetimibe  (ZETIA ) 10 MG tablet 90 tablet 0    Sig: Take 1 tablet (10 mg total) by mouth daily.     Cardiovascular:  Antilipid - Sterol Transport Inhibitors Failed - 06/29/2024  4:51 PM      Failed - Lipid Panel in normal range within the last 12 months    Cholesterol, Total  Date Value Ref Range Status  01/03/2024 171 100 - 199 mg/dL Final   LDL Chol Calc (NIH)  Date Value Ref Range Status  01/03/2024 90 0 - 99 mg/dL Final   Direct LDL  Date Value Ref Range Status  05/25/2014 135.7 mg/dL Final    Comment:    Optimal:  <100 mg/dLNear or Above Optimal:  100-129 mg/dLBorderline High:  130-159 mg/dLHigh:  160-189 mg/dLVery High:  >190 mg/dL   HDL  Date Value Ref Range Status  01/03/2024 51 >39 mg/dL Final   Triglycerides  Date Value Ref Range Status  01/03/2024 173 (H) 0 - 149 mg/dL Final         Passed - AST in normal range and within 360 days    AST  Date Value Ref Range Status  03/25/2024 11 10 - 35 U/L Final   SGOT(AST)  Date Value Ref Range Status  04/15/2012 24 15 - 37 Unit/L Final         Passed - ALT in normal range and within 360 days    ALT  Date Value Ref Range Status  03/25/2024 13 6 - 29 U/L Final   SGPT (ALT)  Date Value Ref Range Status  04/15/2012 33 12 - 78 U/L Final         Passed - Patient is not pregnant      Passed - Valid encounter within last 12 months    Recent Outpatient Visits           5 months ago Routine general medical examination at a health care facility   [facility] [name], DO       Future Appointments             In 2 months Rice, [name], MD [facility] Health Rheumatology - A Dept Of [place]. [facility]   In 4 months McGowan, [name] [facility] [place]   In 9 months [name], MD [facility]

## 2024-06-30 ENCOUNTER — Other Ambulatory Visit: Payer: Self-pay

## 2024-07-01 ENCOUNTER — Other Ambulatory Visit (HOSPITAL_COMMUNITY): Payer: Self-pay

## 2024-07-01 ENCOUNTER — Other Ambulatory Visit: Payer: Self-pay

## 2024-07-01 MED FILL — Ezetimibe Tab 10 MG: ORAL | 90 days supply | Qty: 90 | Fill #0 | Status: AC

## 2024-07-07 ENCOUNTER — Other Ambulatory Visit: Payer: Self-pay | Admitting: Family Medicine

## 2024-07-07 NOTE — Telephone Encounter (Signed)
 Requested medication (s) are due for refill today: expired medication  Requested medication (s) are on the active medication list: no   Last refill:  06/26/24  Future visit scheduled: yes 07/16/24  Notes to clinic:   expired medication  requested. Do you want to renew Rx?     Requested Prescriptions  Pending Prescriptions Disp Refills   diclofenac  Sodium (VOLTAREN ) 1 % GEL 300 g 6    Sig: Apply 2 g topically 4 (four) times daily.     Analgesics:  Topicals Failed - 07/07/2024  2:48 PM      Failed - Manual Review: Labs are only required if the patient has taken medication for more than 8 weeks.      Passed - PLT in normal range and within 360 days    Platelets  Date Value Ref Range Status  03/25/2024 258 140 - 400 Thousand/uL Final  01/03/2024 245 150 - 450 x10E3/uL Final         Passed - HGB in normal range and within 360 days    Hemoglobin  Date Value Ref Range Status  03/25/2024 12.2 11.7 - 15.5 g/dL Final  92/74/7974 88.2 11.1 - 15.9 g/dL Final         Passed - HCT in normal range and within 360 days    HCT  Date Value Ref Range Status  03/25/2024 37.2 35.0 - 45.0 % Final   Hematocrit  Date Value Ref Range Status  01/03/2024 37.4 34.0 - 46.6 % Final         Passed - Cr in normal range and within 360 days    Creat  Date Value Ref Range Status  03/25/2024 0.72 0.50 - 1.03 mg/dL Final         Passed - eGFR is 30 or above and within 360 days    EGFR (African American)  Date Value Ref Range Status  04/15/2012 >60  Final   GFR calc Af Amer  Date Value Ref Range Status  08/05/2020 104 >59 mL/min/1.73 Final    Comment:    **In accordance with recommendations from the NKF-ASN Task force,**   [facility] is in the process of updating its eGFR calculation to the   2021 CKD-EPI creatinine equation that estimates kidney function   without a race variable.    EGFR (Non-African Amer.)  Date Value Ref Range Status  04/15/2012 >60  Final    Comment:    eGFR values  <12mL/min/1.73 m2 may be an indication of chronic kidney disease (CKD). Calculated eGFR is useful in patients with stable renal function. The eGFR calculation will not be reliable in acutely ill patients when serum creatinine is changing rapidly. It is not useful in  patients on dialysis. The eGFR calculation may not be applicable to patients at the low and high extremes of body sizes, pregnant women, and vegetarians.    GFR, Estimated  Date Value Ref Range Status  12/30/2023 >60 >60 mL/min Final    Comment:    (NOTE) Calculated using the CKD-EPI Creatinine Equation (2021)    GFR  Date Value Ref Range Status  06/01/2015 82.19 >60.00 mL/min Final   eGFR  Date Value Ref Range Status  03/25/2024 99 > OR = 60 mL/min/1.32m2 Final  01/03/2024 99 >59 mL/min/1.73 Final         Passed - Patient is not pregnant      Passed - Valid encounter within last 12 months    Recent Outpatient Visits  6 months ago Routine general medical examination at a health care facility   [facility] [name], DO       Future Appointments             In 2 months Rice, [name], MD [facility] Health Rheumatology - A Dept Of [place]. [facility]   In 4 months McGowan, [name] [facility] [place]   In 9 months [name], MD [facility] [place] [facility]

## 2024-07-08 ENCOUNTER — Other Ambulatory Visit: Payer: Self-pay

## 2024-07-08 MED ORDER — DICLOFENAC SODIUM 1 % EX GEL
2.0000 g | Freq: Four times a day (QID) | CUTANEOUS | 6 refills | Status: AC
  Filled 2024-07-08: qty 300, 37d supply, fill #0

## 2024-07-09 ENCOUNTER — Ambulatory Visit: Admitting: Family Medicine

## 2024-07-16 ENCOUNTER — Other Ambulatory Visit (HOSPITAL_COMMUNITY): Payer: Self-pay

## 2024-07-16 ENCOUNTER — Other Ambulatory Visit: Payer: Self-pay

## 2024-07-16 ENCOUNTER — Ambulatory Visit: Admitting: Family Medicine

## 2024-07-16 ENCOUNTER — Encounter: Payer: Self-pay | Admitting: Family Medicine

## 2024-07-16 VITALS — BP 126/76 | HR 77 | Temp 97.4°F | Resp 20 | Ht 66.0 in | Wt 304.0 lb

## 2024-07-16 DIAGNOSIS — E559 Vitamin D deficiency, unspecified: Secondary | ICD-10-CM

## 2024-07-16 DIAGNOSIS — I10 Essential (primary) hypertension: Secondary | ICD-10-CM

## 2024-07-16 DIAGNOSIS — E782 Mixed hyperlipidemia: Secondary | ICD-10-CM

## 2024-07-16 DIAGNOSIS — L4050 Arthropathic psoriasis, unspecified: Secondary | ICD-10-CM

## 2024-07-16 DIAGNOSIS — Z23 Encounter for immunization: Secondary | ICD-10-CM

## 2024-07-16 MED ORDER — PRAVASTATIN SODIUM 20 MG PO TABS
20.0000 mg | ORAL_TABLET | ORAL | 0 refills | Status: AC
  Filled 2024-07-16 – 2024-07-17 (×2): qty 36, 84d supply, fill #0

## 2024-07-16 MED ORDER — CYCLOBENZAPRINE HCL 10 MG PO TABS
10.0000 mg | ORAL_TABLET | Freq: Every evening | ORAL | 1 refills | Status: AC | PRN
  Filled 2024-07-16: qty 90, 90d supply, fill #0

## 2024-07-16 MED ORDER — HYDROCHLOROTHIAZIDE 25 MG PO TABS
25.0000 mg | ORAL_TABLET | Freq: Every day | ORAL | 1 refills | Status: AC
  Filled 2024-07-16: qty 180, 90d supply, fill #0

## 2024-07-16 MED ORDER — MELOXICAM 15 MG PO TABS
15.0000 mg | ORAL_TABLET | Freq: Every day | ORAL | 1 refills | Status: AC
  Filled 2024-07-16: qty 90, 90d supply, fill #0

## 2024-07-16 MED ORDER — PANTOPRAZOLE SODIUM 40 MG PO TBEC
80.0000 mg | DELAYED_RELEASE_TABLET | Freq: Every day | ORAL | 1 refills | Status: AC
  Filled 2024-07-16: qty 180, 90d supply, fill #0

## 2024-07-16 MED ORDER — EZETIMIBE 10 MG PO TABS
10.0000 mg | ORAL_TABLET | Freq: Every day | ORAL | 0 refills | Status: AC
  Filled 2024-07-16: qty 90, 90d supply, fill #0

## 2024-07-16 MED ORDER — ACYCLOVIR 400 MG PO TABS
400.0000 mg | ORAL_TABLET | Freq: Three times a day (TID) | ORAL | 1 refills | Status: AC | PRN
  Filled 2024-07-16: qty 270, 90d supply, fill #0

## 2024-07-16 MED ORDER — BUSPIRONE HCL 10 MG PO TABS
10.0000 mg | ORAL_TABLET | Freq: Three times a day (TID) | ORAL | 6 refills | Status: AC | PRN
  Filled 2024-07-16: qty 270, 90d supply, fill #0

## 2024-07-16 NOTE — Assessment & Plan Note (Signed)
 Under good control on current regimen. Continue current regimen. Continue to monitor. Call with any concerns. Refills given. Labs drawn today.

## 2024-07-16 NOTE — Assessment & Plan Note (Signed)
 Continue to follow with rheumatology. Feeling stiff- will take flexeril  PRN. Call with any concerns.

## 2024-07-16 NOTE — Progress Notes (Signed)
 "  BP 126/76   Pulse 77   Temp (!) 97.4 F (36.3 C) (Oral)   Resp 20   Ht 5' 6 (1.676 m)   Wt (!) 304 lb (137.9 kg)   SpO2 98%   BMI 49.07 kg/m    Subjective:    Patient ID: Latoya Lutz, female    DOB: 07-26-1970, 54 y.o.   MRN: [id number]  HPI: Latoya Lutz is a 54 y.o. female  Chief Complaint  Patient presents with   Hypertension   Hyperlipidemia   HYPERTENSION / HYPERLIPIDEMIA Satisfied with current treatment? yes Duration of hypertension: chronic BP monitoring frequency: rarely BP medication side effects: no Past BP meds: HCTZ Duration of hyperlipidemia: chronic Cholesterol medication side effects: no Cholesterol supplements: none Past cholesterol medications: zetia , pravastatin  Medication compliance: excellent compliance Aspirin: no Recent stressors: no Recurrent headaches: no Visual changes: no Palpitations: no Dyspnea: no Chest pain: no Lower extremity edema: no Dizzy/lightheaded: no  Has been feeling stiff. Doesn't feel like her psoriatic arthritis medicine is working as well. Has been taking her flexeril  and that does seem like it's been helping.   Relevant past medical, surgical, family and social history reviewed and updated as indicated. Interim medical history since our last visit reviewed. Allergies and medications reviewed and updated.  Review of Systems  Constitutional: Negative.   Respiratory: Negative.    Cardiovascular: Negative.   Musculoskeletal: Negative.   Neurological: Negative.   Psychiatric/Behavioral: Negative.      Per HPI unless specifically indicated above     Objective:    BP 126/76   Pulse 77   Temp (!) 97.4 F (36.3 C) (Oral)   Resp 20   Ht 5' 6 (1.676 m)   Wt (!) 304 lb (137.9 kg)   SpO2 98%   BMI 49.07 kg/m   Wt Readings from Last 3 Encounters:  07/16/24 (!) 304 lb (137.9 kg)  06/23/24 297 lb (134.7 kg)  03/25/24 (!) 303 lb 6.4 oz (137.6 kg)    Physical Exam Vitals and nursing note  reviewed.  Constitutional:      General: She is not in acute distress.    Appearance: Normal appearance. She is obese. She is not ill-appearing, toxic-appearing or diaphoretic.  HENT:     Head: Normocephalic and atraumatic.     Right Ear: External ear normal.     Left Ear: External ear normal.     Nose: Nose normal.     Mouth/Throat:     Mouth: Mucous membranes are moist.     Pharynx: Oropharynx is clear.  Eyes:     General: No scleral icterus.       Right eye: No discharge.        Left eye: No discharge.     Extraocular Movements: Extraocular movements intact.     Conjunctiva/sclera: Conjunctivae normal.     Pupils: Pupils are equal, round, and reactive to light.  Cardiovascular:     Rate and Rhythm: Normal rate and regular rhythm.     Pulses: Normal pulses.     Heart sounds: Normal heart sounds. No murmur heard.    No friction rub. No gallop.  Pulmonary:     Effort: Pulmonary effort is normal. No respiratory distress.     Breath sounds: Normal breath sounds. No stridor. No wheezing, rhonchi or rales.  Chest:     Chest wall: No tenderness.  Musculoskeletal:        General: Normal range of motion.     Cervical  back: Normal range of motion and neck supple.  Skin:    General: Skin is warm and dry.     Capillary Refill: Capillary refill takes less than 2 seconds.     Coloration: Skin is not jaundiced or pale.     Findings: No bruising, erythema, lesion or rash.  Neurological:     General: No focal deficit present.     Mental Status: She is alert and oriented to person, place, and time. Mental status is at baseline.  Psychiatric:        Mood and Affect: Mood normal.        Behavior: Behavior normal.        Thought Content: Thought content normal.        Judgment: Judgment normal.     Results for orders placed or performed in visit on 06/23/24  Microscopic Examination   Collection Time: 06/23/24  2:06 PM   Urine  Result Value Ref Range   WBC, UA 0-5 0 - 5 /hpf   RBC,  Urine 0-2 0 - 2 /hpf   Epithelial Cells (non renal) >10 (A) 0 - 10 /hpf   Crystals Present (A) N/A   Crystal Type Amorphous Sediment N/A   Mucus, UA Present (A) Not Estab.   Bacteria, UA Few None seen/Few  Urinalysis, Complete   Collection Time: 06/23/24  2:06 PM  Result Value Ref Range   Specific Gravity, UA 1.025 1.005 - 1.030   pH, UA 6.0 5.0 - 7.5   Color, UA Yellow Yellow   Appearance Ur Slightly cloudy Clear   Leukocytes,UA Trace (A) Negative   Protein,UA Negative Negative/Trace   Glucose, UA Negative Negative   Ketones, UA Negative Negative   RBC, UA Trace (A) Negative   Bilirubin, UA Negative Negative   Urobilinogen, Ur 0.2 0.2 - 1.0 mg/dL   Nitrite, UA Negative Negative   Microscopic Examination See below:       Assessment & Plan:   Problem List Items Addressed This Visit       Cardiovascular and Mediastinum   Essential hypertension   Under good control on current regimen. Continue current regimen. Continue to monitor. Call with any concerns. Refills given. Labs drawn today.       Relevant Medications   ezetimibe  (ZETIA ) 10 MG tablet   hydrochlorothiazide  (HYDRODIURIL ) 25 MG tablet   pravastatin  (PRAVACHOL ) 20 MG tablet (Start on 07/17/2024)   Other Relevant Orders   CBC with Differential/Platelet   Comprehensive metabolic panel with GFR     Musculoskeletal and Integument   Psoriatic arthritis (HCC)   Continue to follow with rheumatology. Feeling stiff- will take flexeril  PRN. Call with any concerns.       Relevant Medications   cyclobenzaprine  (FLEXERIL ) 10 MG tablet   meloxicam  (MOBIC ) 15 MG tablet     Other   Hyperlipidemia - Primary   Under good control on current regimen. Continue current regimen. Continue to monitor. Call with any concerns. Refills given. Labs drawn today.        Relevant Medications   ezetimibe  (ZETIA ) 10 MG tablet   hydrochlorothiazide  (HYDRODIURIL ) 25 MG tablet   pravastatin  (PRAVACHOL ) 20 MG tablet (Start on 07/17/2024)    Other Relevant Orders   CBC with Differential/Platelet   Comprehensive metabolic panel with GFR   Lipid Panel w/o Chol/HDL Ratio   Other Visit Diagnoses       Vitamin D  deficiency       Labs drawn today. Await results.   Relevant Orders  VITAMIN D  25 Hydroxy (Vit-D Deficiency, Fractures)     Need for pneumococcal 20-valent conjugate vaccination       Prevnar and hep b #2 given today.   Relevant Orders   Pneumococcal conjugate vaccine 20-valent (Prevnar 20)        Follow up plan: Return in about 6 months (around 01/13/2025) for physical.      "

## 2024-07-16 NOTE — Patient Instructions (Signed)
 Please call to schedule your mammogram and/or bone density: [facility] at [facility]  Address: [address], [place], [place] [address] Phone: [phone]  [facility] [address] [place],  [place]  [address] Phone: [phone]

## 2024-07-17 ENCOUNTER — Other Ambulatory Visit: Payer: Self-pay

## 2024-07-17 ENCOUNTER — Other Ambulatory Visit (HOSPITAL_COMMUNITY): Payer: Self-pay

## 2024-07-17 LAB — LIPID PANEL W/O CHOL/HDL RATIO
Cholesterol, Total: 180 mg/dL (ref 100–199)
HDL: 53 mg/dL
LDL Chol Calc (NIH): 99 mg/dL (ref 0–99)
Triglycerides: 162 mg/dL — ABNORMAL HIGH (ref 0–149)
VLDL Cholesterol Cal: 28 mg/dL (ref 5–40)

## 2024-07-17 LAB — COMPREHENSIVE METABOLIC PANEL WITH GFR
ALT: 15 [IU]/L (ref 0–32)
AST: 15 [IU]/L (ref 0–40)
Albumin: 4.3 g/dL (ref 3.8–4.9)
Alkaline Phosphatase: 105 [IU]/L (ref 49–135)
BUN/Creatinine Ratio: 18 (ref 9–23)
BUN: 16 mg/dL (ref 6–24)
Bilirubin Total: 0.2 mg/dL (ref 0.0–1.2)
CO2: 27 mmol/L (ref 20–29)
Calcium: 9.4 mg/dL (ref 8.7–10.2)
Chloride: 99 mmol/L (ref 96–106)
Creatinine, Ser: 0.9 mg/dL (ref 0.57–1.00)
Globulin, Total: 2.2 g/dL (ref 1.5–4.5)
Glucose: 95 mg/dL (ref 70–99)
Potassium: 3.8 mmol/L (ref 3.5–5.2)
Sodium: 142 mmol/L (ref 134–144)
Total Protein: 6.5 g/dL (ref 6.0–8.5)
eGFR: 76 mL/min/{1.73_m2}

## 2024-07-17 LAB — CBC WITH DIFFERENTIAL/PLATELET
Basophils Absolute: 0 10*3/uL (ref 0.0–0.2)
Basos: 1 %
EOS (ABSOLUTE): 0.2 10*3/uL (ref 0.0–0.4)
Eos: 2 %
Hematocrit: 38.3 % (ref 34.0–46.6)
Hemoglobin: 11.9 g/dL (ref 11.1–15.9)
Immature Grans (Abs): 0 10*3/uL (ref 0.0–0.1)
Immature Granulocytes: 0 %
Lymphocytes Absolute: 2.4 10*3/uL (ref 0.7–3.1)
Lymphs: 34 %
MCH: 26.2 pg — ABNORMAL LOW (ref 26.6–33.0)
MCHC: 31.1 g/dL — ABNORMAL LOW (ref 31.5–35.7)
MCV: 84 fL (ref 79–97)
Monocytes Absolute: 0.5 10*3/uL (ref 0.1–0.9)
Monocytes: 7 %
Neutrophils Absolute: 3.9 10*3/uL (ref 1.4–7.0)
Neutrophils: 56 %
Platelets: 270 10*3/uL (ref 150–450)
RBC: 4.54 x10E6/uL (ref 3.77–5.28)
RDW: 13.5 % (ref 11.7–15.4)
WBC: 7 10*3/uL (ref 3.4–10.8)

## 2024-07-17 LAB — VITAMIN D 25 HYDROXY (VIT D DEFICIENCY, FRACTURES): Vit D, 25-Hydroxy: 27.6 ng/mL — ABNORMAL LOW (ref 30.0–100.0)

## 2024-08-07 ENCOUNTER — Encounter

## 2024-09-23 ENCOUNTER — Ambulatory Visit: Admitting: Internal Medicine

## 2024-11-12 ENCOUNTER — Ambulatory Visit: Admitting: Urology

## 2024-12-07 ENCOUNTER — Ambulatory Visit: Admitting: Urology

## 2025-01-08 ENCOUNTER — Encounter: Admitting: Family Medicine

## 2025-04-14 ENCOUNTER — Ambulatory Visit: Admitting: Dermatology
